# Patient Record
Sex: Female | Born: 1985
Health system: Southern US, Community
[De-identification: ages and names within clinical notes are randomized; demographics above are authoritative.]

## PROBLEM LIST (undated history)

## (undated) DIAGNOSIS — K5090 Crohn's disease, unspecified, without complications: Secondary | ICD-10-CM

## (undated) DIAGNOSIS — F1290 Cannabis use, unspecified, uncomplicated: Secondary | ICD-10-CM

## (undated) DIAGNOSIS — D219 Benign neoplasm of connective and other soft tissue, unspecified: Secondary | ICD-10-CM

## (undated) DIAGNOSIS — I10 Essential (primary) hypertension: Secondary | ICD-10-CM

## (undated) DIAGNOSIS — R1115 Cyclical vomiting syndrome unrelated to migraine: Secondary | ICD-10-CM

## (undated) DIAGNOSIS — E282 Polycystic ovarian syndrome: Secondary | ICD-10-CM

## (undated) DIAGNOSIS — R112 Nausea with vomiting, unspecified: Secondary | ICD-10-CM

## (undated) DIAGNOSIS — N809 Endometriosis, unspecified: Secondary | ICD-10-CM

## (undated) DIAGNOSIS — R1116 Cannabis hyperemesis syndrome: Secondary | ICD-10-CM

## (undated) HISTORY — PX: ABDOMINAL HYSTERECTOMY: SHX81

## (undated) HISTORY — PX: HERNIA REPAIR: SHX51

## (undated) HISTORY — PX: TUBAL LIGATION: SHX77

## (undated) HISTORY — PX: TONSILLECTOMY: SUR1361

## (undated) HISTORY — PX: CHOLECYSTECTOMY: SHX55

---

## 2008-10-07 ENCOUNTER — Emergency Department (HOSPITAL_BASED_OUTPATIENT_CLINIC_OR_DEPARTMENT_OTHER): Admission: EM | Admit: 2008-10-07 | Discharge: 2008-10-07 | Payer: Self-pay | Admitting: Emergency Medicine

## 2008-10-07 ENCOUNTER — Ambulatory Visit: Payer: Self-pay | Admitting: Radiology

## 2009-01-31 ENCOUNTER — Emergency Department (HOSPITAL_BASED_OUTPATIENT_CLINIC_OR_DEPARTMENT_OTHER): Admission: EM | Admit: 2009-01-31 | Discharge: 2009-01-31 | Payer: Self-pay | Admitting: Emergency Medicine

## 2009-01-31 ENCOUNTER — Emergency Department (HOSPITAL_BASED_OUTPATIENT_CLINIC_OR_DEPARTMENT_OTHER): Admission: EM | Admit: 2009-01-31 | Discharge: 2009-02-01 | Payer: Self-pay | Admitting: Emergency Medicine

## 2009-03-18 ENCOUNTER — Ambulatory Visit: Payer: Self-pay | Admitting: Diagnostic Radiology

## 2009-03-18 ENCOUNTER — Emergency Department (HOSPITAL_BASED_OUTPATIENT_CLINIC_OR_DEPARTMENT_OTHER): Admission: EM | Admit: 2009-03-18 | Discharge: 2009-03-18 | Payer: Self-pay | Admitting: Emergency Medicine

## 2009-05-11 ENCOUNTER — Emergency Department (HOSPITAL_BASED_OUTPATIENT_CLINIC_OR_DEPARTMENT_OTHER): Admission: EM | Admit: 2009-05-11 | Discharge: 2009-05-11 | Payer: Self-pay | Admitting: Emergency Medicine

## 2009-06-18 ENCOUNTER — Emergency Department (HOSPITAL_BASED_OUTPATIENT_CLINIC_OR_DEPARTMENT_OTHER): Admission: EM | Admit: 2009-06-18 | Discharge: 2009-06-18 | Payer: Self-pay | Admitting: Emergency Medicine

## 2009-09-17 ENCOUNTER — Emergency Department (HOSPITAL_BASED_OUTPATIENT_CLINIC_OR_DEPARTMENT_OTHER): Admission: EM | Admit: 2009-09-17 | Discharge: 2009-09-17 | Payer: Self-pay | Admitting: Emergency Medicine

## 2009-10-19 ENCOUNTER — Emergency Department (HOSPITAL_BASED_OUTPATIENT_CLINIC_OR_DEPARTMENT_OTHER): Admission: EM | Admit: 2009-10-19 | Discharge: 2009-10-19 | Payer: Self-pay | Admitting: Emergency Medicine

## 2009-10-26 ENCOUNTER — Emergency Department (HOSPITAL_BASED_OUTPATIENT_CLINIC_OR_DEPARTMENT_OTHER): Admission: EM | Admit: 2009-10-26 | Discharge: 2009-10-27 | Payer: Self-pay | Admitting: Emergency Medicine

## 2010-08-11 ENCOUNTER — Other Ambulatory Visit: Payer: Self-pay | Admitting: Emergency Medicine

## 2010-08-11 ENCOUNTER — Emergency Department (HOSPITAL_BASED_OUTPATIENT_CLINIC_OR_DEPARTMENT_OTHER)
Admission: EM | Admit: 2010-08-11 | Discharge: 2010-08-11 | Disposition: A | Discharge status: Home / Self Care | Payer: PRIVATE HEALTH INSURANCE | Attending: Pediatrics | Admitting: Pediatrics

## 2010-08-11 ENCOUNTER — Encounter (HOSPITAL_BASED_OUTPATIENT_CLINIC_OR_DEPARTMENT_OTHER): Payer: Self-pay | Admitting: Radiology

## 2010-08-11 ENCOUNTER — Emergency Department (INDEPENDENT_AMBULATORY_CARE_PROVIDER_SITE_OTHER): Payer: PRIVATE HEALTH INSURANCE

## 2010-08-11 DIAGNOSIS — R112 Nausea with vomiting, unspecified: Secondary | ICD-10-CM

## 2010-08-11 DIAGNOSIS — R197 Diarrhea, unspecified: Secondary | ICD-10-CM | POA: Insufficient documentation

## 2010-08-11 DIAGNOSIS — I10 Essential (primary) hypertension: Secondary | ICD-10-CM | POA: Insufficient documentation

## 2010-08-11 LAB — URINE MICROSCOPIC-ADD ON

## 2010-08-11 LAB — URINALYSIS, ROUTINE W REFLEX MICROSCOPIC
Ketones, ur: 15 mg/dL — AB
Leukocytes, UA: NEGATIVE
Protein, ur: 30 mg/dL — AB
Urine Glucose, Fasting: NEGATIVE mg/dL
Urobilinogen, UA: 1 mg/dL (ref 0.0–1.0)

## 2010-08-11 LAB — PREGNANCY, URINE: Preg Test, Ur: NEGATIVE

## 2010-09-27 LAB — DIFFERENTIAL
Eosinophils Absolute: 0 10*3/uL (ref 0.0–0.7)
Eosinophils Relative: 0 % (ref 0–5)
Lymphocytes Relative: 14 % (ref 12–46)
Lymphs Abs: 2 10*3/uL (ref 0.7–4.0)
Monocytes Absolute: 0.9 10*3/uL (ref 0.1–1.0)

## 2010-09-27 LAB — CBC
HCT: 34.5 % — ABNORMAL LOW (ref 36.0–46.0)
Hemoglobin: 12.2 g/dL (ref 12.0–15.0)
MCV: 92.5 fL (ref 78.0–100.0)
RDW: 12.4 % (ref 11.5–15.5)
WBC: 14.5 10*3/uL — ABNORMAL HIGH (ref 4.0–10.5)

## 2010-09-27 LAB — BASIC METABOLIC PANEL
BUN: 8 mg/dL (ref 6–23)
Chloride: 107 mEq/L (ref 96–112)
GFR calc non Af Amer: >60 mL/min (ref >60)
Glucose, Bld: 101 mg/dL — ABNORMAL HIGH (ref 70–99)
Potassium: 3.6 mEq/L (ref 3.5–5.1)
Sodium: 140 mEq/L (ref 135–145)

## 2010-09-27 LAB — URINALYSIS, ROUTINE W REFLEX MICROSCOPIC
Bilirubin Urine: NEGATIVE
Glucose, UA: NEGATIVE mg/dL
Ketones, ur: >80 mg/dL — AB
Protein, ur: NEGATIVE mg/dL
pH: 6.5 (ref 5.0–8.0)

## 2010-09-27 LAB — URINE MICROSCOPIC-ADD ON

## 2010-09-28 LAB — DIFFERENTIAL
Basophils Absolute: 0.1 10*3/uL (ref 0.0–0.1)
Eosinophils Relative: 0 % (ref 0–5)
Lymphocytes Relative: 5 % — ABNORMAL LOW (ref 12–46)
Lymphs Abs: 0.7 10*3/uL (ref 0.7–4.0)
Monocytes Absolute: 0.4 10*3/uL (ref 0.1–1.0)

## 2010-09-28 LAB — URINE CULTURE

## 2010-09-28 LAB — BASIC METABOLIC PANEL
Chloride: 111 mEq/L (ref 96–112)
GFR calc non Af Amer: >60 mL/min (ref >60)
Glucose, Bld: 91 mg/dL (ref 70–99)
Potassium: 3.7 mEq/L (ref 3.5–5.1)
Sodium: 139 mEq/L (ref 135–145)

## 2010-09-28 LAB — URINALYSIS, ROUTINE W REFLEX MICROSCOPIC
Hgb urine dipstick: NEGATIVE
Nitrite: NEGATIVE
Protein, ur: NEGATIVE mg/dL
Urobilinogen, UA: 0.2 mg/dL (ref 0.0–1.0)

## 2010-09-28 LAB — CBC
HCT: 33.8 % — ABNORMAL LOW (ref 36.0–46.0)
Hemoglobin: 11.9 g/dL — ABNORMAL LOW (ref 12.0–15.0)
RDW: 12.1 % (ref 11.5–15.5)

## 2010-10-03 LAB — URINALYSIS, ROUTINE W REFLEX MICROSCOPIC
Glucose, UA: NEGATIVE mg/dL
Ketones, ur: >80 mg/dL — AB
Leukocytes, UA: NEGATIVE
Nitrite: NEGATIVE
Protein, ur: 100 mg/dL — AB

## 2010-10-03 LAB — CBC
MCHC: 34.7 g/dL (ref 30.0–36.0)
MCV: 92.1 fL (ref 78.0–100.0)
Platelets: 214 10*3/uL (ref 150–400)
RDW: 12.1 % (ref 11.5–15.5)

## 2010-10-03 LAB — URINE MICROSCOPIC-ADD ON

## 2010-10-03 LAB — PREGNANCY, URINE: Preg Test, Ur: POSITIVE

## 2010-10-03 LAB — BASIC METABOLIC PANEL
BUN: 8 mg/dL (ref 6–23)
CO2: 20 mEq/L (ref 19–32)
Chloride: 110 mEq/L (ref 96–112)
Creatinine, Ser: 0.6 mg/dL (ref 0.4–1.2)

## 2010-10-03 LAB — DIFFERENTIAL
Eosinophils Absolute: 0.2 10*3/uL (ref 0.0–0.7)
Monocytes Absolute: 0.9 10*3/uL (ref 0.1–1.0)
Neutrophils Relative %: 89 % — ABNORMAL HIGH (ref 43–77)

## 2010-10-06 LAB — CBC
MCH: 28.3 pg (ref 26.0–34.0)
MCV: 78.8 fL (ref 78.0–100.0)
Platelets: 322 10*3/uL (ref 150–400)
RBC: 4.8 MIL/uL (ref 3.87–5.11)

## 2010-10-06 LAB — COMPREHENSIVE METABOLIC PANEL
Albumin: 5.1 g/dL (ref 3.5–5.2)
BUN: 18 mg/dL (ref 6–23)
Chloride: 108 mEq/L (ref 96–112)
Creatinine, Ser: 0.9 mg/dL (ref 0.4–1.2)
GFR calc non Af Amer: >60 mL/min (ref >60)
Total Bilirubin: 1.3 mg/dL — ABNORMAL HIGH (ref 0.3–1.2)

## 2010-10-06 LAB — LIPASE, BLOOD: Lipase: 31 U/L (ref 23–300)

## 2010-10-06 LAB — DIFFERENTIAL
Basophils Absolute: 0 10*3/uL (ref 0.0–0.1)
Lymphocytes Relative: 5 % — ABNORMAL LOW (ref 12–46)
Neutro Abs: 15.8 10*3/uL — ABNORMAL HIGH (ref 1.7–7.7)

## 2010-10-11 LAB — URINALYSIS, ROUTINE W REFLEX MICROSCOPIC
Leukocytes, UA: NEGATIVE
Protein, ur: 100 mg/dL — AB
Urobilinogen, UA: 0.2 mg/dL (ref 0.0–1.0)

## 2010-10-11 LAB — COMPREHENSIVE METABOLIC PANEL
Albumin: 4.8 g/dL (ref 3.5–5.2)
Alkaline Phosphatase: 96 U/L (ref 39–117)
BUN: 7 mg/dL (ref 6–23)
Calcium: 9.7 mg/dL (ref 8.4–10.5)
Potassium: 3.7 mEq/L (ref 3.5–5.1)
Sodium: 142 mEq/L (ref 135–145)
Total Protein: 8 g/dL (ref 6.0–8.3)

## 2010-10-11 LAB — DIFFERENTIAL
Basophils Relative: 5 % — ABNORMAL HIGH (ref 0–1)
Lymphs Abs: 0.6 10*3/uL — ABNORMAL LOW (ref 0.7–4.0)
Monocytes Absolute: 0.5 10*3/uL (ref 0.1–1.0)
Monocytes Relative: 3 % (ref 3–12)
Neutro Abs: 14 10*3/uL — ABNORMAL HIGH (ref 1.7–7.7)

## 2010-10-11 LAB — CBC
HCT: 39.6 % (ref 36.0–46.0)
MCHC: 34.5 g/dL (ref 30.0–36.0)
Platelets: 261 10*3/uL (ref 150–400)
RDW: 12.4 % (ref 11.5–15.5)

## 2010-10-11 LAB — URINE MICROSCOPIC-ADD ON

## 2010-10-11 LAB — GC/CHLAMYDIA PROBE AMP, GENITAL: Chlamydia, DNA Probe: NEGATIVE

## 2010-10-11 LAB — WET PREP, GENITAL

## 2010-10-11 LAB — PREGNANCY, URINE: Preg Test, Ur: NEGATIVE

## 2010-10-11 LAB — RPR: RPR Ser Ql: NONREACTIVE

## 2010-10-12 LAB — WET PREP, GENITAL
Trich, Wet Prep: NONE SEEN
WBC, Wet Prep HPF POC: NONE SEEN
Yeast Wet Prep HPF POC: NONE SEEN

## 2010-10-12 LAB — GC/CHLAMYDIA PROBE AMP, GENITAL: GC Probe Amp, Genital: NEGATIVE

## 2010-10-12 LAB — PREGNANCY, URINE: Preg Test, Ur: NEGATIVE

## 2010-10-14 LAB — URINALYSIS, ROUTINE W REFLEX MICROSCOPIC
Bilirubin Urine: NEGATIVE
Glucose, UA: NEGATIVE mg/dL
Hgb urine dipstick: NEGATIVE
Ketones, ur: NEGATIVE mg/dL
Nitrite: NEGATIVE
Protein, ur: NEGATIVE mg/dL
Specific Gravity, Urine: 1.012 (ref 1.005–1.030)
Urobilinogen, UA: 1 mg/dL (ref 0.0–1.0)
pH: 6.5 (ref 5.0–8.0)

## 2010-10-14 LAB — CBC
MCHC: 35 g/dL (ref 30.0–36.0)
Platelets: 238 10*3/uL (ref 150–400)
RDW: 12.3 % (ref 11.5–15.5)

## 2010-10-14 LAB — PREGNANCY, URINE: Preg Test, Ur: NEGATIVE

## 2010-10-14 LAB — BASIC METABOLIC PANEL
BUN: 12 mg/dL (ref 6–23)
CO2: 26 mEq/L (ref 19–32)
Calcium: 9.4 mg/dL (ref 8.4–10.5)
Creatinine, Ser: 0.8 mg/dL (ref 0.4–1.2)
GFR calc non Af Amer: >60 mL/min (ref >60)
Glucose, Bld: 84 mg/dL (ref 70–99)

## 2010-10-14 LAB — DIFFERENTIAL
Basophils Absolute: 0.1 10*3/uL (ref 0.0–0.1)
Eosinophils Absolute: 0.2 10*3/uL (ref 0.0–0.7)
Lymphocytes Relative: 26 % (ref 12–46)
Monocytes Relative: 6 % (ref 3–12)
Neutro Abs: 5.9 10*3/uL (ref 1.7–7.7)

## 2010-10-14 LAB — GC/CHLAMYDIA PROBE AMP, GENITAL
Chlamydia, DNA Probe: NEGATIVE
GC Probe Amp, Genital: NEGATIVE

## 2010-10-14 LAB — WET PREP, GENITAL: Trich, Wet Prep: NONE SEEN

## 2010-10-16 LAB — COMPREHENSIVE METABOLIC PANEL
Alkaline Phosphatase: 99 U/L (ref 39–117)
BUN: 9 mg/dL (ref 6–23)
CO2: 18 mEq/L — ABNORMAL LOW (ref 19–32)
Chloride: 105 mEq/L (ref 96–112)
Glucose, Bld: 100 mg/dL — ABNORMAL HIGH (ref 70–99)
Potassium: 4.9 mEq/L (ref 3.5–5.1)
Total Bilirubin: 1 mg/dL (ref 0.3–1.2)

## 2010-10-16 LAB — CBC
Hemoglobin: 14.9 g/dL (ref 12.0–15.0)
MCHC: 33.3 g/dL (ref 30.0–36.0)
RBC: 4.8 MIL/uL (ref 3.87–5.11)
WBC: 10.7 10*3/uL — ABNORMAL HIGH (ref 4.0–10.5)

## 2010-10-16 LAB — URINE CULTURE: Colony Count: >=100000

## 2010-10-16 LAB — URINALYSIS, ROUTINE W REFLEX MICROSCOPIC
Protein, ur: 100 mg/dL — AB
Urobilinogen, UA: 0.2 mg/dL (ref 0.0–1.0)

## 2010-10-16 LAB — DIFFERENTIAL
Basophils Absolute: 0.3 10*3/uL — ABNORMAL HIGH (ref 0.0–0.1)
Basophils Relative: 2 % — ABNORMAL HIGH (ref 0–1)
Monocytes Absolute: 0.4 10*3/uL (ref 0.1–1.0)
Neutro Abs: 9.4 10*3/uL — ABNORMAL HIGH (ref 1.7–7.7)
Neutrophils Relative %: 88 % — ABNORMAL HIGH (ref 43–77)

## 2010-10-16 LAB — URINE MICROSCOPIC-ADD ON

## 2010-10-17 ENCOUNTER — Emergency Department (HOSPITAL_BASED_OUTPATIENT_CLINIC_OR_DEPARTMENT_OTHER)
Admission: EM | Admit: 2010-10-17 | Discharge: 2010-10-17 | Disposition: A | Discharge status: Home / Self Care | Payer: PRIVATE HEALTH INSURANCE | Attending: Emergency Medicine | Admitting: Emergency Medicine

## 2010-10-17 DIAGNOSIS — I10 Essential (primary) hypertension: Secondary | ICD-10-CM | POA: Insufficient documentation

## 2010-10-17 DIAGNOSIS — R197 Diarrhea, unspecified: Secondary | ICD-10-CM | POA: Insufficient documentation

## 2010-10-17 DIAGNOSIS — R112 Nausea with vomiting, unspecified: Secondary | ICD-10-CM | POA: Insufficient documentation

## 2010-10-17 LAB — DIFFERENTIAL
Basophils Absolute: 0 10*3/uL (ref 0.0–0.1)
Basophils Relative: 0 % (ref 0–1)
Eosinophils Absolute: 0 10*3/uL (ref 0.0–0.7)
Eosinophils Relative: 0 % (ref 0–5)
Monocytes Absolute: 0.9 10*3/uL (ref 0.1–1.0)
Neutro Abs: 12.9 10*3/uL — ABNORMAL HIGH (ref 1.7–7.7)

## 2010-10-17 LAB — COMPREHENSIVE METABOLIC PANEL
ALT: 13 U/L (ref 0–35)
AST: 31 U/L (ref 0–37)
Calcium: 10.2 mg/dL (ref 8.4–10.5)
Creatinine, Ser: 0.7 mg/dL (ref 0.4–1.2)
GFR calc Af Amer: >60 mL/min (ref >60)
Sodium: 144 mEq/L (ref 135–145)
Total Protein: 10.1 g/dL — ABNORMAL HIGH (ref 6.0–8.3)

## 2010-10-17 LAB — URINE MICROSCOPIC-ADD ON

## 2010-10-17 LAB — URINALYSIS, ROUTINE W REFLEX MICROSCOPIC
Bilirubin Urine: NEGATIVE
Nitrite: NEGATIVE
Specific Gravity, Urine: 1.015 (ref 1.005–1.030)
pH: 6 (ref 5.0–8.0)

## 2010-10-17 LAB — CBC
Hemoglobin: 14.6 g/dL (ref 12.0–15.0)
MCHC: 35.2 g/dL (ref 30.0–36.0)
Platelets: 322 10*3/uL (ref 150–400)
RDW: 12.8 % (ref 11.5–15.5)

## 2010-10-20 LAB — COMPREHENSIVE METABOLIC PANEL
ALT: 19 U/L (ref 0–35)
AST: 25 U/L (ref 0–37)
Albumin: 4.6 g/dL (ref 3.5–5.2)
CO2: 23 mEq/L (ref 19–32)
Calcium: 9.5 mg/dL (ref 8.4–10.5)
Creatinine, Ser: 0.9 mg/dL (ref 0.4–1.2)
GFR calc Af Amer: >60 mL/min (ref >60)
GFR calc non Af Amer: >60 mL/min (ref >60)
Sodium: 142 mEq/L (ref 135–145)

## 2010-10-20 LAB — PREGNANCY, URINE: Preg Test, Ur: NEGATIVE

## 2010-10-20 LAB — URINALYSIS, ROUTINE W REFLEX MICROSCOPIC
Bilirubin Urine: NEGATIVE
Glucose, UA: NEGATIVE mg/dL
Hgb urine dipstick: NEGATIVE
Ketones, ur: 40 mg/dL — AB
Nitrite: NEGATIVE
Specific Gravity, Urine: 1.013 (ref 1.005–1.030)
pH: 6.5 (ref 5.0–8.0)

## 2010-10-20 LAB — DIFFERENTIAL
Eosinophils Absolute: 0 10*3/uL (ref 0.0–0.7)
Eosinophils Relative: 0 % (ref 0–5)
Lymphocytes Relative: 7 % — ABNORMAL LOW (ref 12–46)
Lymphs Abs: 0.7 10*3/uL (ref 0.7–4.0)
Monocytes Absolute: 0.6 10*3/uL (ref 0.1–1.0)
Monocytes Relative: 5 % (ref 3–12)

## 2010-10-20 LAB — CBC
MCHC: 33.3 g/dL (ref 30.0–36.0)
Platelets: 268 10*3/uL (ref 150–400)
RBC: 4.84 MIL/uL (ref 3.87–5.11)
WBC: 10.5 10*3/uL (ref 4.0–10.5)

## 2010-11-02 ENCOUNTER — Emergency Department (HOSPITAL_BASED_OUTPATIENT_CLINIC_OR_DEPARTMENT_OTHER)
Admission: EM | Admit: 2010-11-02 | Discharge: 2010-11-02 | Disposition: A | Discharge status: Home / Self Care | Payer: PRIVATE HEALTH INSURANCE | Attending: Emergency Medicine | Admitting: Emergency Medicine

## 2010-11-02 DIAGNOSIS — R112 Nausea with vomiting, unspecified: Secondary | ICD-10-CM | POA: Insufficient documentation

## 2010-11-02 DIAGNOSIS — I10 Essential (primary) hypertension: Secondary | ICD-10-CM | POA: Insufficient documentation

## 2010-11-02 DIAGNOSIS — R197 Diarrhea, unspecified: Secondary | ICD-10-CM | POA: Insufficient documentation

## 2010-11-02 LAB — BASIC METABOLIC PANEL
BUN: 6 mg/dL (ref 6–23)
Chloride: 107 mEq/L (ref 96–112)
GFR calc non Af Amer: >60 mL/min (ref >60)
Glucose, Bld: 86 mg/dL (ref 70–99)
Potassium: 3.9 mEq/L (ref 3.5–5.1)
Sodium: 144 mEq/L (ref 135–145)

## 2010-11-02 LAB — URINALYSIS, ROUTINE W REFLEX MICROSCOPIC
Bilirubin Urine: NEGATIVE
Nitrite: NEGATIVE
Specific Gravity, Urine: 1.026 (ref 1.005–1.030)
Urobilinogen, UA: 0.2 mg/dL (ref 0.0–1.0)
pH: 6 (ref 5.0–8.0)

## 2010-11-02 LAB — URINE MICROSCOPIC-ADD ON

## 2010-11-30 ENCOUNTER — Emergency Department (HOSPITAL_BASED_OUTPATIENT_CLINIC_OR_DEPARTMENT_OTHER)
Admission: EM | Admit: 2010-11-30 | Discharge: 2010-11-30 | Disposition: A | Discharge status: Home / Self Care | Payer: PRIVATE HEALTH INSURANCE | Attending: Emergency Medicine | Admitting: Emergency Medicine

## 2010-11-30 DIAGNOSIS — R112 Nausea with vomiting, unspecified: Secondary | ICD-10-CM | POA: Insufficient documentation

## 2010-11-30 DIAGNOSIS — I10 Essential (primary) hypertension: Secondary | ICD-10-CM | POA: Insufficient documentation

## 2010-11-30 DIAGNOSIS — R109 Unspecified abdominal pain: Secondary | ICD-10-CM | POA: Insufficient documentation

## 2010-11-30 LAB — BASIC METABOLIC PANEL
BUN: 11 mg/dL (ref 6–23)
CO2: 19 mEq/L (ref 19–32)
Calcium: 9.7 mg/dL (ref 8.4–10.5)
Chloride: 104 mEq/L (ref 96–112)
Creatinine, Ser: 0.6 mg/dL (ref 0.4–1.2)

## 2010-11-30 LAB — URINALYSIS, ROUTINE W REFLEX MICROSCOPIC
Bilirubin Urine: NEGATIVE
Glucose, UA: NEGATIVE mg/dL
Ketones, ur: 40 mg/dL — AB
Leukocytes, UA: NEGATIVE
Nitrite: NEGATIVE
Protein, ur: NEGATIVE mg/dL
Specific Gravity, Urine: 1.015 (ref 1.005–1.030)
Urobilinogen, UA: 1 mg/dL (ref 0.0–1.0)
pH: 7 (ref 5.0–8.0)

## 2010-11-30 LAB — URINE MICROSCOPIC-ADD ON

## 2010-11-30 LAB — BASIC METABOLIC PANEL WITH GFR
GFR calc Af Amer: >60 mL/min (ref >60)
GFR calc non Af Amer: >60 mL/min (ref >60)
Glucose, Bld: 116 mg/dL — ABNORMAL HIGH (ref 70–99)
Potassium: 3.5 meq/L (ref 3.5–5.1)
Sodium: 138 meq/L (ref 135–145)

## 2010-11-30 LAB — PREGNANCY, URINE: Preg Test, Ur: NEGATIVE

## 2010-12-22 ENCOUNTER — Emergency Department (HOSPITAL_BASED_OUTPATIENT_CLINIC_OR_DEPARTMENT_OTHER)
Admission: EM | Admit: 2010-12-22 | Discharge: 2010-12-22 | Disposition: A | Discharge status: Home / Self Care | Payer: PRIVATE HEALTH INSURANCE | Attending: Emergency Medicine | Admitting: Emergency Medicine

## 2010-12-22 DIAGNOSIS — E876 Hypokalemia: Secondary | ICD-10-CM | POA: Insufficient documentation

## 2010-12-22 DIAGNOSIS — R112 Nausea with vomiting, unspecified: Secondary | ICD-10-CM | POA: Insufficient documentation

## 2010-12-22 LAB — DIFFERENTIAL
Lymphocytes Relative: 10 % — ABNORMAL LOW (ref 12–46)
Lymphs Abs: 1.1 10*3/uL (ref 0.7–4.0)
Neutro Abs: 9.3 10*3/uL — ABNORMAL HIGH (ref 1.7–7.7)
Neutrophils Relative %: 81 % — ABNORMAL HIGH (ref 43–77)

## 2010-12-22 LAB — COMPREHENSIVE METABOLIC PANEL
Albumin: 4.9 g/dL (ref 3.5–5.2)
Alkaline Phosphatase: 101 U/L (ref 39–117)
BUN: 16 mg/dL (ref 6–23)
Chloride: 103 mEq/L (ref 96–112)
Glucose, Bld: 132 mg/dL — ABNORMAL HIGH (ref 70–99)
Potassium: 3 mEq/L — ABNORMAL LOW (ref 3.5–5.1)
Total Bilirubin: 1.1 mg/dL (ref 0.3–1.2)

## 2010-12-22 LAB — CBC
HCT: 42.1 % (ref 36.0–46.0)
Hemoglobin: 14.6 g/dL (ref 12.0–15.0)
RBC: 4.98 MIL/uL (ref 3.87–5.11)
RDW: 13.5 % (ref 11.5–15.5)
WBC: 11.5 10*3/uL — ABNORMAL HIGH (ref 4.0–10.5)

## 2010-12-22 LAB — LIPASE, BLOOD: Lipase: 43 U/L (ref 11–59)

## 2010-12-22 LAB — URINALYSIS, ROUTINE W REFLEX MICROSCOPIC
Bilirubin Urine: NEGATIVE
Hgb urine dipstick: NEGATIVE
Specific Gravity, Urine: 1.015 (ref 1.005–1.030)
Urobilinogen, UA: 1 mg/dL (ref 0.0–1.0)
pH: 6 (ref 5.0–8.0)

## 2010-12-22 LAB — URINE MICROSCOPIC-ADD ON

## 2011-01-16 ENCOUNTER — Emergency Department (HOSPITAL_BASED_OUTPATIENT_CLINIC_OR_DEPARTMENT_OTHER)
Admission: EM | Admit: 2011-01-16 | Discharge: 2011-01-17 | Discharge status: Against Medical Advice | Payer: PRIVATE HEALTH INSURANCE | Attending: Emergency Medicine | Admitting: Emergency Medicine

## 2011-01-16 ENCOUNTER — Encounter (HOSPITAL_BASED_OUTPATIENT_CLINIC_OR_DEPARTMENT_OTHER): Payer: Self-pay | Admitting: *Deleted

## 2011-01-16 ENCOUNTER — Emergency Department (HOSPITAL_BASED_OUTPATIENT_CLINIC_OR_DEPARTMENT_OTHER)
Admission: EM | Admit: 2011-01-16 | Discharge: 2011-01-16 | Disposition: A | Discharge status: Home / Self Care | Payer: PRIVATE HEALTH INSURANCE | Attending: Emergency Medicine | Admitting: Emergency Medicine

## 2011-01-16 DIAGNOSIS — I10 Essential (primary) hypertension: Secondary | ICD-10-CM | POA: Insufficient documentation

## 2011-01-16 DIAGNOSIS — R1110 Vomiting, unspecified: Secondary | ICD-10-CM | POA: Insufficient documentation

## 2011-01-16 DIAGNOSIS — R1115 Cyclical vomiting syndrome unrelated to migraine: Secondary | ICD-10-CM

## 2011-01-16 DIAGNOSIS — R197 Diarrhea, unspecified: Secondary | ICD-10-CM | POA: Insufficient documentation

## 2011-01-16 DIAGNOSIS — R1011 Right upper quadrant pain: Secondary | ICD-10-CM | POA: Insufficient documentation

## 2011-01-16 HISTORY — DX: Essential (primary) hypertension: I10

## 2011-01-16 HISTORY — DX: Cyclical vomiting syndrome unrelated to migraine: R11.15

## 2011-01-16 LAB — PREGNANCY, URINE: Preg Test, Ur: NEGATIVE

## 2011-01-16 LAB — URINALYSIS, ROUTINE W REFLEX MICROSCOPIC
Glucose, UA: NEGATIVE mg/dL
Leukocytes, UA: NEGATIVE
Nitrite: NEGATIVE
Protein, ur: NEGATIVE mg/dL
pH: 7 (ref 5.0–8.0)

## 2011-01-16 LAB — CBC
Platelets: 244 10*3/uL (ref 150–400)
RDW: 12.7 % (ref 11.5–15.5)
WBC: 8.3 10*3/uL (ref 4.0–10.5)

## 2011-01-16 LAB — BASIC METABOLIC PANEL
CO2: 28 mEq/L (ref 19–32)
Chloride: 105 mEq/L (ref 96–112)
GFR calc Af Amer: >60 mL/min (ref >60)
Sodium: 141 mEq/L (ref 135–145)

## 2011-01-16 LAB — DIFFERENTIAL
Basophils Absolute: 0 10*3/uL (ref 0.0–0.1)
Lymphocytes Relative: 23 % (ref 12–46)
Neutro Abs: 5.5 10*3/uL (ref 1.7–7.7)
Neutrophils Relative %: 67 % (ref 43–77)

## 2011-01-16 MED ORDER — HYDROMORPHONE HCL 1 MG/ML IJ SOLN
1.0000 mg | INTRAMUSCULAR | Status: DC | PRN
  Administered 2011-01-16 (×2): 1 mg via INTRAVENOUS
  Filled 2011-01-16 (×2): qty 1

## 2011-01-16 MED ORDER — PROMETHAZINE HCL 25 MG/ML IJ SOLN
25.0000 mg | Freq: Once | INTRAMUSCULAR | Status: AC
  Administered 2011-01-16: 25 mg via INTRAVENOUS
  Filled 2011-01-16: qty 1

## 2011-01-16 MED ORDER — HYDROCODONE-ACETAMINOPHEN 5-325 MG PO TABS: 1.0000 | ORAL_TABLET | Freq: Four times a day (QID) | ORAL | Status: AC | PRN

## 2011-01-16 MED ORDER — SODIUM CHLORIDE 0.9 % IV SOLN
999.0000 mL | Freq: Once | INTRAVENOUS | Status: AC
  Administered 2011-01-16: 1000 mL via INTRAVENOUS

## 2011-01-16 NOTE — ED Notes (Addendum)
Patient is resting comfortably. IV access assessed. Bedside commode placed near patient. Introduced self to pt, pain, position, and toileting addressed

## 2011-01-16 NOTE — ED Notes (Signed)
Pt given po ginger ale per MD request. Tolerating well. Call bell at bedside.

## 2011-01-16 NOTE — ED Provider Notes (Addendum)
History     Chief Complaint  Patient presents with  . Abdominal Pain   Patient is a 25 y.o. female presenting with abdominal pain. The history is provided by the patient.  Abdominal Pain The primary symptoms of the illness include abdominal pain, nausea, vomiting and diarrhea. The current episode started more than 2 days ago. The problem has not changed since onset. The abdominal pain is located in the RUQ. Pain scale: moderate. The abdominal pain is relieved by nothing. The abdominal pain is exacerbated by vomiting and movement.  Associated with: cyclic vomiting syndrome and not being able to keep her PO or PR Phenergan in.    Past Medical History  Diagnosis Date  . Hypertension   . Cyclical vomiting     Past Surgical History  Procedure Date  . Tonsillectomy   . Tubal ligation   . Cesarean section     History reviewed. No pertinent family history.  History  Substance Use Topics  . Smoking status: Never Smoker   . Smokeless tobacco: Not on file  . Alcohol Use: No    OB History    Grav Para Term Preterm Abortions TAB SAB Ect Mult Living                  Review of Systems  Gastrointestinal: Positive for nausea, vomiting, abdominal pain and diarrhea.  All other systems reviewed and are negative.    Physical Exam  BP 136/84  Pulse 79  Temp(Src) 97.8 F (36.6 C) (Oral)  Resp 16  Ht 5' 3"  (1.6 m)  Wt 139 lb (63.05 kg)  BMI 24.62 kg/m2  SpO2 100%  LMP 12/28/2010  Physical Exam  Constitutional: She appears well-developed and well-nourished.  HENT:  Head: Normocephalic and atraumatic.  Eyes: EOM are normal. Pupils are equal, round, and reactive to light.  Neck: Normal range of motion. Neck supple.  Cardiovascular: Normal rate and regular rhythm.   Pulmonary/Chest: Effort normal and breath sounds normal.  Abdominal: Soft. She exhibits no distension. There is tenderness in the right upper quadrant. There is no rigidity and no guarding.    ED Course    Procedures  MDM Results for orders placed during the hospital encounter of 01/16/11  CBC      Component Value Range   WBC 8.3  4.0 - 10.5 (K/uL)   RBC 4.01  3.87 - 5.11 (MIL/uL)   Hemoglobin 11.8 (*) 12.0 - 15.0 (g/dL)   HCT 35.1 (*) 36.0 - 46.0 (%)   MCV 87.5  78.0 - 100.0 (fL)   MCH 29.4  26.0 - 34.0 (pg)   MCHC 33.6  30.0 - 36.0 (g/dL)   RDW 12.7  11.5 - 15.5 (%)   Platelets 244  150 - 400 (K/uL)  DIFFERENTIAL      Component Value Range   Neutrophils Relative 67  43 - 77 (%)   Neutro Abs 5.5  1.7 - 7.7 (K/uL)   Lymphocytes Relative 23  12 - 46 (%)   Lymphs Abs 1.9  0.7 - 4.0 (K/uL)   Monocytes Relative 8  3 - 12 (%)   Monocytes Absolute 0.7  0.1 - 1.0 (K/uL)   Eosinophils Relative 2  0 - 5 (%)   Eosinophils Absolute 0.2  0.0 - 0.7 (K/uL)   Basophils Relative 0  0 - 1 (%)   Basophils Absolute 0.0  0.0 - 0.1 (K/uL)  BASIC METABOLIC PANEL      Component Value Range   Sodium 141  135 - 145 (mEq/L)   Potassium 3.8  3.5 - 5.1 (mEq/L)   Chloride 105  96 - 112 (mEq/L)   CO2 28  19 - 32 (mEq/L)   Glucose, Bld 93  70 - 99 (mg/dL)   BUN 10  6 - 23 (mg/dL)   Creatinine, Ser 0.60  0.50 - 1.10 (mg/dL)   Calcium 9.7  8.4 - 10.5 (mg/dL)   GFR calc non Af Amer >60  >60 (mL/min)   GFR calc Af Amer >60  >60 (mL/min)  URINALYSIS, ROUTINE W REFLEX MICROSCOPIC      Component Value Range   Color, Urine YELLOW  YELLOW    Appearance CLEAR  CLEAR    Specific Gravity, Urine 1.009  1.005 - 1.030    pH 7.0  5.0 - 8.0    Glucose, UA NEGATIVE  NEGATIVE (mg/dL)   Hgb urine dipstick NEGATIVE  NEGATIVE    Bilirubin Urine NEGATIVE  NEGATIVE    Ketones NEGATIVE  NEGATIVE (mg/dL)   Protein NEGATIVE  NEGATIVE (mg/dL)   Urobilinogen, UA 0.2  0.0 - 1.0 (mg/dL)   Nitrite NEGATIVE  NEGATIVE    Leukocytes, UA NEGATIVE  NEGATIVE   PREGNANCY, URINE      Component Value Range   Preg Test, Ur NEGATIVE         7:52 AM -- Drinking fluids without emesis. Requests hydrocodone for pain as she is  out.   Wynetta Fines, MD 01/16/11 [place], MD 01/16/11 [phone]

## 2011-01-16 NOTE — ED Notes (Signed)
Pt c/o abd pain with N/V/D that began 2 days ago. Pt has hx of cyclic vomiting and sts her phenergan suppositories are not working.

## 2011-01-16 NOTE — ED Notes (Signed)
Pt reports chronic N/V/D x 3 days states that she was seen here yesterday for same sx and her PCP who told her to come here

## 2011-01-18 ENCOUNTER — Encounter (HOSPITAL_BASED_OUTPATIENT_CLINIC_OR_DEPARTMENT_OTHER): Payer: Self-pay | Admitting: *Deleted

## 2011-01-18 ENCOUNTER — Emergency Department (HOSPITAL_BASED_OUTPATIENT_CLINIC_OR_DEPARTMENT_OTHER)
Admission: EM | Admit: 2011-01-18 | Discharge: 2011-01-18 | Disposition: A | Discharge status: Home / Self Care | Payer: PRIVATE HEALTH INSURANCE | Attending: Emergency Medicine | Admitting: Emergency Medicine

## 2011-01-18 DIAGNOSIS — R112 Nausea with vomiting, unspecified: Secondary | ICD-10-CM | POA: Insufficient documentation

## 2011-01-18 DIAGNOSIS — R1084 Generalized abdominal pain: Secondary | ICD-10-CM | POA: Insufficient documentation

## 2011-01-18 LAB — CBC
Hemoglobin: 12 g/dL (ref 12.0–15.0)
MCHC: 33.7 g/dL (ref 30.0–36.0)
Platelets: 249 10*3/uL (ref 150–400)
RBC: 4.12 MIL/uL (ref 3.87–5.11)

## 2011-01-18 LAB — DIFFERENTIAL
Basophils Relative: 0 % (ref 0–1)
Eosinophils Absolute: 0.1 10*3/uL (ref 0.0–0.7)
Monocytes Relative: 6 % (ref 3–12)
Neutro Abs: 8.6 10*3/uL — ABNORMAL HIGH (ref 1.7–7.7)
Neutrophils Relative %: 81 % — ABNORMAL HIGH (ref 43–77)

## 2011-01-18 LAB — URINALYSIS, ROUTINE W REFLEX MICROSCOPIC
Bilirubin Urine: NEGATIVE
Glucose, UA: NEGATIVE mg/dL
Hgb urine dipstick: NEGATIVE
Ketones, ur: 15 mg/dL — AB
Specific Gravity, Urine: 1.02 (ref 1.005–1.030)
pH: 7 (ref 5.0–8.0)

## 2011-01-18 LAB — COMPREHENSIVE METABOLIC PANEL
ALT: 9 U/L (ref 0–35)
AST: 19 U/L (ref 0–37)
Albumin: 4.1 g/dL (ref 3.5–5.2)
Alkaline Phosphatase: 88 U/L (ref 39–117)
BUN: 7 mg/dL (ref 6–23)
Chloride: 102 mEq/L (ref 96–112)
Potassium: 3.3 mEq/L — ABNORMAL LOW (ref 3.5–5.1)
Sodium: 140 mEq/L (ref 135–145)
Total Bilirubin: 0.4 mg/dL (ref 0.3–1.2)
Total Protein: 7.6 g/dL (ref 6.0–8.3)

## 2011-01-18 MED ORDER — PROMETHAZINE HCL 25 MG RE SUPP: 25.0000 mg | Freq: Four times a day (QID) | RECTAL | Status: DC | PRN

## 2011-01-18 MED ORDER — SODIUM CHLORIDE 0.9 % IV BOLUS (SEPSIS)
1000.0000 mL | Freq: Once | INTRAVENOUS | Status: AC
  Administered 2011-01-18: 1000 mL via INTRAVENOUS

## 2011-01-18 MED ORDER — PROMETHAZINE HCL 25 MG/ML IJ SOLN
25.0000 mg | Freq: Once | INTRAMUSCULAR | Status: AC
  Administered 2011-01-18: 25 mg via INTRAVENOUS
  Filled 2011-01-18: qty 1

## 2011-01-18 MED ORDER — HYDROMORPHONE HCL 1 MG/ML IJ SOLN
1.0000 mg | Freq: Once | INTRAMUSCULAR | Status: AC
  Administered 2011-01-18: 1 mg via INTRAVENOUS
  Filled 2011-01-18: qty 1

## 2011-01-18 NOTE — ED Provider Notes (Signed)
History     Chief Complaint  Patient presents with  . Emesis   Patient is a 25 y.o. female presenting with abdominal pain. The history is provided by the patient.  Abdominal Pain The primary symptoms of the illness include abdominal pain, nausea, vomiting and diarrhea. The primary symptoms of the illness do not include fever, dysuria, vaginal discharge or vaginal bleeding. The current episode started more than 2 days ago. The onset of the illness was gradual. The problem has been gradually worsening.  The patient states that she believes she is currently not pregnant. The patient has not had a change in bowel habit. Additional symptoms associated with the illness include anorexia. Symptoms associated with the illness do not include heartburn, urgency or back pain.  h/o chronic abd pain since age 24, seen by her pcp for same yesterday, has referral to see gi tomorrow--states only phenergan and dialudid make her sx better  Past Medical History  Diagnosis Date  . Hypertension   . Cyclical vomiting     Past Surgical History  Procedure Date  . Tonsillectomy   . Tubal ligation   . Cesarean section     No family history on file.  History  Substance Use Topics  . Smoking status: Never Smoker   . Smokeless tobacco: Not on file  . Alcohol Use: No    OB History    Grav Para Term Preterm Abortions TAB SAB Ect Mult Living                  Review of Systems  Constitutional: Negative for fever.  Gastrointestinal: Positive for nausea, vomiting, abdominal pain, diarrhea and anorexia. Negative for heartburn.  Genitourinary: Negative for dysuria, urgency, vaginal bleeding and vaginal discharge.  Musculoskeletal: Negative for back pain.  All other systems reviewed and are negative.    Physical Exam  BP 153/98  Pulse 73  Temp(Src) 97.9 F (36.6 C) (Oral)  Resp 20  Ht 5' 3"  (1.6 m)  Wt 138 lb (62.596 kg)  BMI 24.45 kg/m2  SpO2 100%  LMP 12/28/2010  Physical Exam  Nursing note  and vitals reviewed. Constitutional: She is oriented to person, place, and time. Vital signs are normal. She appears well-developed and well-nourished.  Non-toxic appearance. No distress.  HENT:  Head: Normocephalic and atraumatic.  Eyes: Conjunctivae and EOM are normal. Pupils are equal, round, and reactive to light.  Neck: Normal range of motion. Neck supple. No tracheal deviation present.  Cardiovascular: Normal rate, regular rhythm and normal heart sounds.  Exam reveals no gallop.   No murmur heard. Pulmonary/Chest: Effort normal and breath sounds normal. No stridor. No respiratory distress. She has no wheezes.  Abdominal: Soft. Normal appearance and bowel sounds are normal. She exhibits no distension and no mass. There is tenderness. There is no rebound, no guarding and no CVA tenderness. No hernia.  Musculoskeletal: Normal range of motion. She exhibits no edema and no tenderness.  Neurological: She is alert and oriented to person, place, and time. She has normal strength. No cranial nerve deficit or sensory deficit. GCS eye subscore is 4. GCS verbal subscore is 5. GCS motor subscore is 6.  Skin: Skin is warm and dry.  Psychiatric: She has a normal mood and affect. Her speech is normal and behavior is normal.    ED Course  Procedures  MDM Pt given iv fluids and meds for n/v/pain  Pt feeling better, will send home      Leota Jacobsen, MD 01/18/11  2220 

## 2011-01-18 NOTE — ED Notes (Signed)
Pt c/o n/v. Hx of same. Seen here Sunday for same

## 2011-02-17 ENCOUNTER — Telehealth (HOSPITAL_BASED_OUTPATIENT_CLINIC_OR_DEPARTMENT_OTHER): Payer: Self-pay | Admitting: Emergency Medicine

## 2011-02-17 ENCOUNTER — Encounter (HOSPITAL_BASED_OUTPATIENT_CLINIC_OR_DEPARTMENT_OTHER): Payer: Self-pay

## 2011-02-17 ENCOUNTER — Emergency Department (HOSPITAL_BASED_OUTPATIENT_CLINIC_OR_DEPARTMENT_OTHER)
Admission: EM | Admit: 2011-02-17 | Discharge: 2011-02-17 | Disposition: A | Discharge status: Home / Self Care | Payer: PRIVATE HEALTH INSURANCE | Attending: Emergency Medicine | Admitting: Emergency Medicine

## 2011-02-17 DIAGNOSIS — R1115 Cyclical vomiting syndrome unrelated to migraine: Secondary | ICD-10-CM | POA: Insufficient documentation

## 2011-02-17 DIAGNOSIS — R197 Diarrhea, unspecified: Secondary | ICD-10-CM | POA: Insufficient documentation

## 2011-02-17 DIAGNOSIS — I10 Essential (primary) hypertension: Secondary | ICD-10-CM | POA: Insufficient documentation

## 2011-02-17 DIAGNOSIS — R109 Unspecified abdominal pain: Secondary | ICD-10-CM | POA: Insufficient documentation

## 2011-02-17 LAB — BASIC METABOLIC PANEL
CO2: 25 mEq/L (ref 19–32)
Chloride: 102 mEq/L (ref 96–112)
GFR calc non Af Amer: >60 mL/min (ref >60)
Glucose, Bld: 92 mg/dL (ref 70–99)
Potassium: 4 mEq/L (ref 3.5–5.1)
Sodium: 138 mEq/L (ref 135–145)

## 2011-02-17 LAB — PREGNANCY, URINE: Preg Test, Ur: NEGATIVE

## 2011-02-17 LAB — CBC
Platelets: 264 10*3/uL (ref 150–400)
RBC: 4.3 MIL/uL (ref 3.87–5.11)
WBC: 7.7 10*3/uL (ref 4.0–10.5)

## 2011-02-17 LAB — URINALYSIS, ROUTINE W REFLEX MICROSCOPIC
Glucose, UA: NEGATIVE mg/dL
Ketones, ur: NEGATIVE mg/dL
Leukocytes, UA: NEGATIVE
pH: 7 (ref 5.0–8.0)

## 2011-02-17 LAB — HEPATIC FUNCTION PANEL
AST: 16 U/L (ref 0–37)
Albumin: 4.1 g/dL (ref 3.5–5.2)
Alkaline Phosphatase: 85 U/L (ref 39–117)
Total Bilirubin: 0.4 mg/dL (ref 0.3–1.2)

## 2011-02-17 LAB — DIFFERENTIAL
Lymphocytes Relative: 17 % (ref 12–46)
Lymphs Abs: 1.3 10*3/uL (ref 0.7–4.0)
Neutro Abs: 5.7 10*3/uL (ref 1.7–7.7)
Neutrophils Relative %: 75 % (ref 43–77)

## 2011-02-17 MED ORDER — PROMETHAZINE HCL 25 MG/ML IJ SOLN
25.0000 mg | Freq: Once | INTRAMUSCULAR | Status: AC
  Administered 2011-02-17: 25 mg via INTRAVENOUS
  Filled 2011-02-17: qty 1

## 2011-02-17 MED ORDER — SODIUM CHLORIDE 0.9 % IV BOLUS (SEPSIS)
1000.0000 mL | Freq: Once | INTRAVENOUS | Status: AC
  Administered 2011-02-17 (×2): 1000 mL via INTRAVENOUS

## 2011-02-17 MED ORDER — FENTANYL CITRATE 0.05 MG/ML IJ SOLN
100.0000 ug | Freq: Once | INTRAMUSCULAR | Status: AC
  Administered 2011-02-17: 100 ug via INTRAVENOUS
  Filled 2011-02-17: qty 2

## 2011-02-17 MED ORDER — BUTALBITAL-ASA-CAFF-CODEINE 50-325-40-30 MG PO CAPS: 1.0000 | ORAL_CAPSULE | ORAL | Status: AC | PRN

## 2011-02-17 MED ORDER — SODIUM CHLORIDE 0.9 % IV SOLN: 999.0000 mL | Freq: Once | INTRAVENOUS | Status: DC

## 2011-02-17 MED ORDER — DIPHENHYDRAMINE HCL 50 MG/ML IJ SOLN
12.5000 mg | Freq: Once | INTRAMUSCULAR | Status: AC
  Administered 2011-02-17: 12.5 mg via INTRAVENOUS
  Filled 2011-02-17: qty 1

## 2011-02-17 NOTE — ED Notes (Signed)
Pt has chronic vomiting and diarrhea but started vomiting Tuesday night and is unable to keep anything down.  Symptoms are unrlieved after taking Phenergan.  Has GI appt 03/20/2011

## 2011-02-17 NOTE — ED Notes (Signed)
Pt reports cont'd pain when vomits-stated she has had this hx since age 25-advised to keep GI appt

## 2011-02-17 NOTE — Progress Notes (Signed)
No vomiting or diarrhea since medications given. Pain improved, but not completely resolved. Discussed plan with pt. She is scheduled to see GI next month.

## 2011-02-17 NOTE — ED Notes (Signed)
Pt resting/talking on phone-2nd L NS started at 125cc/hr per EDPA Marshell Levan order

## 2011-02-17 NOTE — ED Provider Notes (Signed)
History     CSN: [id number] Arrival date & time: 02/17/2011 11:31 AM  Chief Complaint  Patient presents with  . Emesis  . Diarrhea  . Abdominal Pain   Patient is a 25 y.o. female presenting with vomiting, diarrhea, and abdominal pain. The history is provided by the patient.  Emesis  This is a recurrent problem. The current episode started more than 2 days ago. The problem occurs more than 10 times per day. The problem has been gradually worsening. The maximum temperature recorded prior to her arrival was 100 to 100.9 F. Associated symptoms include abdominal pain, chills and diarrhea. Pertinent negatives include no arthralgias and no cough.  Diarrhea The primary symptoms include abdominal pain, vomiting and diarrhea. Primary symptoms do not include dysuria or arthralgias. The illness began 3 to 5 days ago. The onset was gradual. The problem has been gradually worsening.  The illness is also significant for chills. The illness does not include back pain. Significant associated medical issues include gallstones. Associated medical issues do not include liver disease, alcohol abuse, PUD, bowel resection or irritable bowel syndrome. Associated medical issues comments: cyclic vomiting syndrome.  Abdominal Pain The primary symptoms of the illness include abdominal pain, vomiting and diarrhea. The primary symptoms of the illness do not include shortness of breath or dysuria.  Additional symptoms associated with the illness include chills. Symptoms associated with the illness do not include hematuria, frequency or back pain. Significant associated medical issues include gallstones. Significant associated medical issues do not include PUD or liver disease. Associated medical issues comments: cyclic vomiting syndrome.    Past Medical History  Diagnosis Date  . Hypertension   . Cyclical vomiting     Past Surgical History  Procedure Date  . Tonsillectomy   . Tubal ligation   . Cesarean section   .  Cholecystectomy   . Cesarean section   . Tubal ligation     History reviewed. No pertinent family history.  History  Substance Use Topics  . Smoking status: Never Smoker   . Smokeless tobacco: Not on file  . Alcohol Use: No    OB History    Grav Para Term Preterm Abortions TAB SAB Ect Mult Living                  Review of Systems  Constitutional: Positive for chills. Negative for activity change.       All ROS Neg except as noted in HPI  HENT: Negative for nosebleeds and neck pain.   Eyes: Negative for photophobia and discharge.  Respiratory: Negative for cough, shortness of breath and wheezing.   Cardiovascular: Negative for chest pain and palpitations.  Gastrointestinal: Positive for vomiting, abdominal pain and diarrhea. Negative for blood in stool.  Genitourinary: Negative for dysuria, frequency and hematuria.  Musculoskeletal: Negative for back pain and arthralgias.  Skin: Negative.   Neurological: Negative for dizziness, seizures and speech difficulty.  Psychiatric/Behavioral: Negative for hallucinations and confusion.    Physical Exam  BP 128/109  Pulse 85  Temp(Src) 99.7 F (37.6 C) (Oral)  Resp 18  LMP 02/01/2011  Physical Exam  Nursing note and vitals reviewed. Constitutional: She is oriented to person, place, and time. She appears well-developed and well-nourished.  Non-toxic appearance.  HENT:  Head: Normocephalic.  Right Ear: Tympanic membrane and external ear normal.  Left Ear: Tympanic membrane and external ear normal.  Eyes: EOM and lids are normal. Pupils are equal, round, and reactive to light.  Neck: Normal range of  motion. Neck supple. Carotid bruit is not present.  Cardiovascular: Normal rate, regular rhythm, normal heart sounds, intact distal pulses and normal pulses.   Pulmonary/Chest: Breath sounds normal. No respiratory distress.  Abdominal: Soft. Bowel sounds are normal. There is tenderness. There is no guarding.       RUQ pain to  palpation. Mild to mod epigastric and RLQ pain. No mass. No guarding.  Musculoskeletal: Normal range of motion.  Lymphadenopathy:       Head (right side): No submandibular adenopathy present.       Head (left side): No submandibular adenopathy present.    She has no cervical adenopathy.  Neurological: She is alert and oriented to person, place, and time. She has normal strength. No cranial nerve deficit or sensory deficit.  Skin: Skin is warm and dry.  Psychiatric: She has a normal mood and affect. Her speech is normal.    ED Course  Procedures  MDM I have reviewed nursing notes, vital signs, and all appropriate lab and imaging results for this patient. Suspect recurrence of Cyclic Vomiting Syndrome. Will evaluate for UTI, Kidney stone, other GI disorders.      Lenox Ahr, [place] 02/17/11 1227

## 2011-02-17 NOTE — ED Notes (Signed)
Pt called back in after d/c-requested RTW note-EDPA Sherry Key for RTW 02/19/11-written note done-pt advised of need to pick up note

## 2011-02-19 NOTE — ED Provider Notes (Signed)
Medical screening examination/treatment/procedure(s) were performed by non-physician practitioner and as supervising physician I was immediately available for consultation/collaboration.   Dot Lanes, MD 02/19/11 [phone]

## 2011-02-27 ENCOUNTER — Emergency Department (HOSPITAL_BASED_OUTPATIENT_CLINIC_OR_DEPARTMENT_OTHER)
Admission: EM | Admit: 2011-02-27 | Discharge: 2011-02-28 | Disposition: A | Discharge status: Home / Self Care | Payer: PRIVATE HEALTH INSURANCE | Attending: Emergency Medicine | Admitting: Emergency Medicine

## 2011-02-27 ENCOUNTER — Encounter (HOSPITAL_BASED_OUTPATIENT_CLINIC_OR_DEPARTMENT_OTHER): Payer: Self-pay | Admitting: *Deleted

## 2011-02-27 DIAGNOSIS — N390 Urinary tract infection, site not specified: Secondary | ICD-10-CM | POA: Insufficient documentation

## 2011-02-27 DIAGNOSIS — R112 Nausea with vomiting, unspecified: Secondary | ICD-10-CM | POA: Insufficient documentation

## 2011-02-27 DIAGNOSIS — R0602 Shortness of breath: Secondary | ICD-10-CM | POA: Insufficient documentation

## 2011-02-27 DIAGNOSIS — I10 Essential (primary) hypertension: Secondary | ICD-10-CM | POA: Insufficient documentation

## 2011-02-27 DIAGNOSIS — E876 Hypokalemia: Secondary | ICD-10-CM | POA: Insufficient documentation

## 2011-02-27 MED ORDER — HALOPERIDOL LACTATE 5 MG/ML IJ SOLN
2.5000 mg | Freq: Once | INTRAMUSCULAR | Status: AC
  Administered 2011-02-27: 2.5 mg via INTRAVENOUS
  Filled 2011-02-27: qty 1

## 2011-02-27 MED ORDER — DIPHENHYDRAMINE HCL 50 MG/ML IJ SOLN
25.0000 mg | Freq: Once | INTRAMUSCULAR | Status: AC
  Administered 2011-02-27: 25 mg via INTRAVENOUS
  Filled 2011-02-27: qty 1

## 2011-02-27 MED ORDER — SODIUM CHLORIDE 0.9 % IV SOLN: Freq: Once | INTRAVENOUS | Status: DC

## 2011-02-27 MED ORDER — KETOROLAC TROMETHAMINE 30 MG/ML IJ SOLN
30.0000 mg | Freq: Once | INTRAMUSCULAR | Status: AC
  Administered 2011-02-28: 30 mg via INTRAVENOUS
  Filled 2011-02-27: qty 1

## 2011-02-27 MED ORDER — PROMETHAZINE HCL 25 MG/ML IJ SOLN
25.0000 mg | Freq: Once | INTRAMUSCULAR | Status: AC
  Administered 2011-02-27: 25 mg via INTRAVENOUS
  Filled 2011-02-27: qty 1

## 2011-02-27 MED ORDER — SODIUM CHLORIDE 0.9 % IV BOLUS (SEPSIS)
1000.0000 mL | Freq: Once | INTRAVENOUS | Status: AC
  Administered 2011-02-27: 1000 mL via INTRAVENOUS

## 2011-02-27 NOTE — ED Notes (Signed)
PT c/o vomiting x 2 weeks , seen here last week for same

## 2011-02-27 NOTE — ED Provider Notes (Signed)
History     CSN: [id number] Arrival date & time: 02/27/2011 10:46 PM  Chief Complaint  Patient presents with  . Emesis  . Shortness of Breath   HPI Comments: Patient presents with greater than 2 weeks of nausea, vomiting or diarrhea. She has a history of cyclic vomiting syndrome and falsehood GI. She saw her GI doctor 3 days ago but reports she is not any better. She is using Phenergan at home distal vomiting about every hour with diarrhea every hour as well. There is no blood in her emesis or stool. Has not had any fevers at home. Abdominal pain is epigastric and typical of her cyclic vomiting syndrome.  She is scheduled for an endoscopy in September.  The history is provided by the patient.    Past Medical History  Diagnosis Date  . Hypertension   . Cyclical vomiting     Past Surgical History  Procedure Date  . Tonsillectomy   . Tubal ligation   . Cesarean section   . Cholecystectomy   . Cesarean section   . Tubal ligation     History reviewed. No pertinent family history.  History  Substance Use Topics  . Smoking status: Never Smoker   . Smokeless tobacco: Not on file  . Alcohol Use: No    OB History    Grav Para Term Preterm Abortions TAB SAB Ect Mult Living                  Review of Systems  Constitutional: Positive for appetite change. Negative for activity change.  HENT: Negative for congestion and rhinorrhea.   Respiratory: Negative for shortness of breath.   Gastrointestinal: Positive for nausea, vomiting, abdominal pain and diarrhea.  Genitourinary: Negative for dysuria and hematuria.  Musculoskeletal: Negative for back pain.  Neurological: Negative for headaches.    Physical Exam  BP 142/98  Pulse 117  Temp(Src) 100.3 F (37.9 C) (Oral)  Resp 16  SpO2 100%  LMP 02/01/2011  Physical Exam  Constitutional: She is oriented to person, place, and time. She appears well-developed and well-nourished. No distress.  HENT:  Head: Normocephalic and  atraumatic.  Mouth/Throat: Oropharynx is clear and moist. No oropharyngeal exudate.  Eyes: Conjunctivae are normal. Pupils are equal, round, and reactive to light.  Neck: Normal range of motion.  Cardiovascular: Normal rate, regular rhythm and normal heart sounds.   Pulmonary/Chest: Effort normal and breath sounds normal. No respiratory distress.  Abdominal: Soft. There is tenderness. There is no rebound and no guarding.       Mild epigastric tenderness  Musculoskeletal: Normal range of motion. She exhibits no edema and no tenderness.  Neurological: She is alert and oriented to person, place, and time. No cranial nerve deficit.  Skin: Skin is warm.    ED Course  Procedures  MDM Nausea, vomiting, abdominal pain, similar to previous.  Abdomen soft and nonsurgical. UA, chem7, IVF, symptom control   Results for orders placed during the hospital encounter of 72/09/47  BASIC METABOLIC PANEL      Component Value Range   Sodium 138  135 - 145 (mEq/L)   Potassium 3.0 (*) 3.5 - 5.1 (mEq/L)   Chloride 100  96 - 112 (mEq/L)   CO2 24  19 - 32 (mEq/L)   Glucose, Bld 118 (*) 70 - 99 (mg/dL)   BUN 16  6 - 23 (mg/dL)   Creatinine, Ser 1.10  0.50 - 1.10 (mg/dL)   Calcium 9.9  8.4 - 10.5 (mg/dL)  GFR calc non Af Amer >60  >60 (mL/min)   GFR calc Af Amer >60  >60 (mL/min)  URINALYSIS, ROUTINE W REFLEX MICROSCOPIC      Component Value Range   Color, Urine AMBER (*) YELLOW    Appearance CLOUDY (*) CLEAR    Specific Gravity, Urine 1.029  1.005 - 1.030    pH 5.5  5.0 - 8.0    Glucose, UA 100 (*) NEGATIVE (mg/dL)   Hgb urine dipstick LARGE (*) NEGATIVE    Bilirubin Urine SMALL (*) NEGATIVE    Ketones, ur 15 (*) NEGATIVE (mg/dL)   Protein, ur >300 (*) NEGATIVE (mg/dL)   Urobilinogen, UA 0.2  0.0 - 1.0 (mg/dL)   Nitrite NEGATIVE  NEGATIVE    Leukocytes, UA TRACE (*) NEGATIVE   PREGNANCY, URINE      Component Value Range   Preg Test, Ur NEGATIVE    URINE MICROSCOPIC-ADD ON      Component  Value Range   Squamous Epithelial / LPF RARE  RARE    WBC, UA 7-10  <3 (WBC/hpf)   RBC / HPF TOO NUMEROUS TO COUNT  <3 (RBC/hpf)   Bacteria, UA MANY (*) RARE    Casts HYALINE CASTS (*) NEGATIVE    No results found.   Symptomatic improvement.  Abdomen soft.  Potassium replaced. Possible UTI.  Ezequiel Essex, MD 02/28/11 [phone]

## 2011-02-28 LAB — PREGNANCY, URINE: Preg Test, Ur: NEGATIVE

## 2011-02-28 LAB — URINE MICROSCOPIC-ADD ON

## 2011-02-28 LAB — BASIC METABOLIC PANEL
BUN: 16 mg/dL (ref 6–23)
Chloride: 100 mEq/L (ref 96–112)
GFR calc Af Amer: >60 mL/min (ref >60)
Potassium: 3 mEq/L — ABNORMAL LOW (ref 3.5–5.1)

## 2011-02-28 LAB — URINALYSIS, ROUTINE W REFLEX MICROSCOPIC
Nitrite: NEGATIVE
Specific Gravity, Urine: 1.029 (ref 1.005–1.030)
pH: 5.5 (ref 5.0–8.0)

## 2011-02-28 MED ORDER — POTASSIUM CHLORIDE ER 10 MEQ PO TBCR: 10.0000 meq | EXTENDED_RELEASE_TABLET | Freq: Two times a day (BID) | ORAL | Status: DC

## 2011-02-28 MED ORDER — POTASSIUM CHLORIDE CRYS ER 20 MEQ PO TBCR
40.0000 meq | EXTENDED_RELEASE_TABLET | Freq: Once | ORAL | Status: AC
  Administered 2011-02-28: 40 meq via ORAL
  Filled 2011-02-28: qty 2

## 2011-02-28 MED ORDER — NITROFURANTOIN MONOHYD MACRO 100 MG PO CAPS: 100.0000 mg | ORAL_CAPSULE | Freq: Two times a day (BID) | ORAL | Status: AC

## 2011-02-28 NOTE — ED Notes (Signed)
Pt asked for something for pain, notified RN of this. Upon reentering the room noted  pt falling a sleep. Notified RN of this as well.

## 2011-03-28 ENCOUNTER — Emergency Department (HOSPITAL_COMMUNITY)
Admission: EM | Admit: 2011-03-28 | Discharge: 2011-03-28 | Disposition: A | Discharge status: Home / Self Care | Payer: PRIVATE HEALTH INSURANCE | Attending: Emergency Medicine | Admitting: Emergency Medicine

## 2011-03-28 DIAGNOSIS — R1013 Epigastric pain: Secondary | ICD-10-CM | POA: Insufficient documentation

## 2011-03-28 DIAGNOSIS — R112 Nausea with vomiting, unspecified: Secondary | ICD-10-CM | POA: Insufficient documentation

## 2011-03-28 DIAGNOSIS — Z79899 Other long term (current) drug therapy: Secondary | ICD-10-CM | POA: Insufficient documentation

## 2011-03-28 DIAGNOSIS — I10 Essential (primary) hypertension: Secondary | ICD-10-CM | POA: Insufficient documentation

## 2011-03-28 LAB — BASIC METABOLIC PANEL
Chloride: 107 mEq/L (ref 96–112)
Creatinine, Ser: 0.55 mg/dL (ref 0.50–1.10)
GFR calc Af Amer: >60 mL/min (ref >60)
GFR calc non Af Amer: >60 mL/min (ref >60)
Potassium: 3.5 mEq/L (ref 3.5–5.1)

## 2011-03-28 LAB — CBC
HCT: 34.5 % — ABNORMAL LOW (ref 36.0–46.0)
MCH: 29.2 pg (ref 26.0–34.0)
MCV: 86 fL (ref 78.0–100.0)
Platelets: 285 10*3/uL (ref 150–400)
RDW: 12.9 % (ref 11.5–15.5)

## 2011-03-28 LAB — HEPATIC FUNCTION PANEL
ALT: 10 U/L (ref 0–35)
Bilirubin, Direct: <0.1 mg/dL (ref 0.0–0.3)
Total Bilirubin: 0.4 mg/dL (ref 0.3–1.2)

## 2011-03-28 LAB — DIFFERENTIAL
Eosinophils Absolute: 0 10*3/uL (ref 0.0–0.7)
Eosinophils Relative: 0 % (ref 0–5)
Lymphs Abs: 0.7 10*3/uL (ref 0.7–4.0)
Monocytes Relative: 3 % (ref 3–12)

## 2011-03-28 LAB — LIPASE, BLOOD: Lipase: 35 U/L (ref 11–59)

## 2011-04-20 ENCOUNTER — Emergency Department (HOSPITAL_BASED_OUTPATIENT_CLINIC_OR_DEPARTMENT_OTHER)
Admission: EM | Admit: 2011-04-20 | Discharge: 2011-04-20 | Disposition: A | Discharge status: Home / Self Care | Payer: PRIVATE HEALTH INSURANCE | Attending: Emergency Medicine | Admitting: Emergency Medicine

## 2011-04-20 ENCOUNTER — Encounter (HOSPITAL_BASED_OUTPATIENT_CLINIC_OR_DEPARTMENT_OTHER): Payer: Self-pay | Admitting: *Deleted

## 2011-04-20 DIAGNOSIS — I10 Essential (primary) hypertension: Secondary | ICD-10-CM | POA: Insufficient documentation

## 2011-04-20 DIAGNOSIS — R1110 Vomiting, unspecified: Secondary | ICD-10-CM | POA: Insufficient documentation

## 2011-04-20 LAB — URINALYSIS, ROUTINE W REFLEX MICROSCOPIC
Bilirubin Urine: NEGATIVE
Nitrite: NEGATIVE
Protein, ur: 30 mg/dL — AB
Specific Gravity, Urine: 1.023 (ref 1.005–1.030)
Urobilinogen, UA: 0.2 mg/dL (ref 0.0–1.0)

## 2011-04-20 LAB — CBC
Hemoglobin: 13.8 g/dL (ref 12.0–15.0)
MCH: 29.4 pg (ref 26.0–34.0)
MCHC: 35 g/dL (ref 30.0–36.0)
RDW: 12.9 % (ref 11.5–15.5)

## 2011-04-20 LAB — DIFFERENTIAL
Basophils Relative: 0 % (ref 0–1)
Eosinophils Absolute: 0 10*3/uL (ref 0.0–0.7)
Monocytes Relative: 2 % — ABNORMAL LOW (ref 3–12)
Neutrophils Relative %: 94 % — ABNORMAL HIGH (ref 43–77)

## 2011-04-20 LAB — BASIC METABOLIC PANEL
BUN: 6 mg/dL (ref 6–23)
Creatinine, Ser: 0.6 mg/dL (ref 0.50–1.10)
GFR calc Af Amer: >90 mL/min (ref >90)
GFR calc non Af Amer: >90 mL/min (ref >90)
Potassium: 3.4 mEq/L — ABNORMAL LOW (ref 3.5–5.1)

## 2011-04-20 LAB — URINE MICROSCOPIC-ADD ON

## 2011-04-20 MED ORDER — PROMETHAZINE HCL 25 MG/ML IJ SOLN
12.5000 mg | Freq: Once | INTRAMUSCULAR | Status: AC
  Administered 2011-04-20: 12.5 mg via INTRAVENOUS
  Filled 2011-04-20: qty 1

## 2011-04-20 MED ORDER — HYDROMORPHONE HCL 1 MG/ML IJ SOLN
1.0000 mg | Freq: Once | INTRAMUSCULAR | Status: AC
  Administered 2011-04-20: 1 mg via INTRAVENOUS
  Filled 2011-04-20: qty 1

## 2011-04-20 NOTE — ED Provider Notes (Signed)
History     CSN: [id number] Arrival date & time: 04/20/2011  5:42 PM  Chief Complaint  Patient presents with  . Emesis    (Consider location/radiation/quality/duration/timing/severity/associated sxs/prior treatment) Patient is a 25 y.o. female presenting with vomiting. The history is provided by the patient. No language interpreter was used.  Emesis  The current episode started yesterday. The problem occurs continuously. The problem has not changed since onset.The emesis has an appearance of stomach contents. There has been no fever. Associated symptoms include abdominal pain and diarrhea. Risk factors: hx of same.  Pt reports she has cyclical vomitting syndrome.  Pt reports she responds well to phenergan and dilaudid.  Pt reports this is typical of her episodes.  Pt denies fever or chills,   Past Medical History  Diagnosis Date  . Hypertension   . Cyclical vomiting     Past Surgical History  Procedure Date  . Tonsillectomy   . Tubal ligation   . Cesarean section   . Cholecystectomy   . Cesarean section   . Tubal ligation     No family history on file.  History  Substance Use Topics  . Smoking status: Never Smoker   . Smokeless tobacco: Not on file  . Alcohol Use: No    OB History    Grav Para Term Preterm Abortions TAB SAB Ect Mult Living                  Review of Systems  Gastrointestinal: Positive for nausea, vomiting, abdominal pain and diarrhea.  All other systems reviewed and are negative.    Allergies  Peanut-containing drug products; Percocet; and Zofran  Home Medications   Current Outpatient Rx  Name Route Sig Dispense Refill  . FLINTSTONES COMPLETE 60 MG PO CHEW Oral Chew 1 tablet by mouth daily.      Marland Kitchen LABETALOL HCL 100 MG PO TABS Oral Take 100 mg by mouth 2 (two) times daily.      . ATIVAN PO Oral Take 1 tablet by mouth 2 (two) times daily.      Marland Kitchen POTASSIUM CHLORIDE CR 10 MEQ PO TBCR Oral Take 1 tablet (10 mEq total) by mouth 2 (two) times  daily. 6 tablet 0  . PROMETHAZINE HCL 25 MG RE SUPP Rectal Place 25 mg rectally every 6 (six) hours as needed. For nausea and vomiting    . PROMETHAZINE HCL 25 MG PO TABS Oral Take 25 mg by mouth every 6 (six) hours as needed. Nausea and vomiting       BP 154/111  Pulse 107  Temp 98.7 F (37.1 C)  Resp 20  SpO2 100%  LMP 04/20/2011  Physical Exam  Nursing note and vitals reviewed. Constitutional: She is oriented to person, place, and time. She appears well-developed and well-nourished.  HENT:  Head: Normocephalic and atraumatic.  Eyes: Pupils are equal, round, and reactive to light.  Neck: Normal range of motion.  Cardiovascular: Normal rate.   Pulmonary/Chest: Effort normal.  Abdominal: Soft. There is tenderness.  Musculoskeletal: Normal range of motion.  Neurological: She is alert and oriented to person, place, and time.  Skin: Skin is warm.  Psychiatric: She has a normal mood and affect.    ED Course  Procedures (including critical care time)  Labs Reviewed  CBC - Abnormal; Notable for the following:    WBC 15.4 (*)    All other components within normal limits  DIFFERENTIAL - Abnormal; Notable for the following:    Neutrophils Relative 94 (*)  Neutro Abs 14.4 (*)    Lymphocytes Relative 4 (*)    Lymphs Abs 0.6 (*)    Monocytes Relative 2 (*)    All other components within normal limits  BASIC METABOLIC PANEL - Abnormal; Notable for the following:    Potassium 3.4 (*)    Glucose, Bld 116 (*)    All other components within normal limits  URINALYSIS, ROUTINE W REFLEX MICROSCOPIC  PREGNANCY, URINE   No results found.   No diagnosis found.    MDM  Ua show ketones,  Pt given Iv fluid,  Pt given dilaudid and phenergan.  Pt advised to see her MD for recheck tomorrow.        Alyse Low, [place] 04/20/11 2038

## 2011-04-20 NOTE — ED Notes (Signed)
Pt says she has cyclic vomiting syndrome and she has not been able to control it since last thursday

## 2011-04-21 NOTE — ED Provider Notes (Signed)
Medical screening examination/treatment/procedure(s) were performed by non-physician practitioner and as supervising physician I was immediately available for consultation/collaboration.  Chauncy Passy, MD 04/21/11 [phone]

## 2011-04-22 ENCOUNTER — Other Ambulatory Visit: Payer: Self-pay

## 2011-04-22 ENCOUNTER — Emergency Department (HOSPITAL_BASED_OUTPATIENT_CLINIC_OR_DEPARTMENT_OTHER)
Admission: EM | Admit: 2011-04-22 | Discharge: 2011-04-22 | Disposition: A | Discharge status: Home / Self Care | Payer: PRIVATE HEALTH INSURANCE | Attending: Emergency Medicine | Admitting: Emergency Medicine

## 2011-04-22 ENCOUNTER — Encounter (HOSPITAL_BASED_OUTPATIENT_CLINIC_OR_DEPARTMENT_OTHER): Payer: Self-pay | Admitting: *Deleted

## 2011-04-22 DIAGNOSIS — R1110 Vomiting, unspecified: Secondary | ICD-10-CM | POA: Insufficient documentation

## 2011-04-22 DIAGNOSIS — Z79899 Other long term (current) drug therapy: Secondary | ICD-10-CM | POA: Insufficient documentation

## 2011-04-22 DIAGNOSIS — R109 Unspecified abdominal pain: Secondary | ICD-10-CM | POA: Insufficient documentation

## 2011-04-22 DIAGNOSIS — I10 Essential (primary) hypertension: Secondary | ICD-10-CM | POA: Insufficient documentation

## 2011-04-22 MED ORDER — PROMETHAZINE HCL 25 MG/ML IJ SOLN
25.0000 mg | Freq: Once | INTRAMUSCULAR | Status: AC
  Administered 2011-04-22: 25 mg via INTRAVENOUS
  Filled 2011-04-22: qty 1

## 2011-04-22 MED ORDER — HYDROMORPHONE HCL 1 MG/ML IJ SOLN
1.0000 mg | Freq: Once | INTRAMUSCULAR | Status: AC
  Administered 2011-04-22: 1 mg via INTRAVENOUS
  Filled 2011-04-22: qty 1

## 2011-04-22 MED ORDER — SODIUM CHLORIDE 0.9 % IV BOLUS (SEPSIS)
1000.0000 mL | Freq: Once | INTRAVENOUS | Status: AC
  Administered 2011-04-22: 1000 mL via INTRAVENOUS

## 2011-04-22 MED ORDER — LORAZEPAM 2 MG/ML IJ SOLN
1.0000 mg | Freq: Once | INTRAMUSCULAR | Status: AC
  Administered 2011-04-22: 1 mg via INTRAVENOUS
  Filled 2011-04-22: qty 1

## 2011-04-22 MED ORDER — DIPHENHYDRAMINE HCL 50 MG/ML IJ SOLN
25.0000 mg | Freq: Once | INTRAMUSCULAR | Status: AC
  Administered 2011-04-22: 25 mg via INTRAVENOUS
  Filled 2011-04-22: qty 1

## 2011-04-22 MED ORDER — DROPERIDOL 2.5 MG/ML IJ SOLN
1.2500 mg | Freq: Once | INTRAMUSCULAR | Status: AC
  Administered 2011-04-22: 1.25 mg via INTRAVENOUS
  Filled 2011-04-22: qty 2

## 2011-04-22 NOTE — ED Notes (Signed)
Awaiting ride - pt resting - appears comfortable

## 2011-04-22 NOTE — ED Notes (Signed)
Upon entering room to d/c pt, pt c/o nausea, dry heaving noted. Pt sts she thinks she drank "too fast"- MD made aware.

## 2011-04-22 NOTE — ED Notes (Signed)
BP checked x 2 when pt roomed.

## 2011-04-22 NOTE — ED Provider Notes (Addendum)
History     CSN: [id number] Arrival date & time: 04/22/2011  1:38 PM  Chief Complaint  Patient presents with  . Emesis    (Consider location/radiation/quality/duration/timing/severity/associated sxs/prior treatment) HPI Comments: Patient presents with symptoms consistent with her cyclic vomiting syndrome. Patient was notably here 2 days ago for similar symptoms. She did receive Dilaudid and Phenergan x2 doses with good relief of her symptoms and was discharged home. She did have a urinalysis and urine pregnancy test at that time and her urine pregnancy test was negative. Patient notes that she is on her menses currently. She states her symptoms are similar to her symptoms with abdominal pain and emesis that is barely her stomach contents. No new fevers. She's tried taking her Phenergan suppositories at home and has been unable to use these to control her symptoms.  Patient is a 25 y.o. female presenting with vomiting. The history is provided by the patient. No language interpreter was used.  Emesis  This is a recurrent problem. The current episode started yesterday. The problem has not changed since onset.The emesis has an appearance of stomach contents. There has been no fever. Associated symptoms include abdominal pain. Pertinent negatives include no chills, no cough, no diarrhea, no fever and no headaches.    Past Medical History  Diagnosis Date  . Hypertension   . Cyclical vomiting     Past Surgical History  Procedure Date  . Tonsillectomy   . Tubal ligation   . Cesarean section   . Cholecystectomy   . Cesarean section   . Tubal ligation     History reviewed. No pertinent family history.  History  Substance Use Topics  . Smoking status: Never Smoker   . Smokeless tobacco: Not on file  . Alcohol Use: No    OB History    Grav Para Term Preterm Abortions TAB SAB Ect Mult Living                  Review of Systems  Constitutional: Negative.  Negative for fever and  chills.  HENT: Negative.   Eyes: Negative.  Negative for discharge and redness.  Respiratory: Negative.  Negative for cough and shortness of breath.   Cardiovascular: Negative.  Negative for chest pain.  Gastrointestinal: Positive for nausea, vomiting and abdominal pain. Negative for diarrhea.  Genitourinary: Negative.  Negative for dysuria and vaginal discharge.  Musculoskeletal: Negative.  Negative for back pain.  Skin: Negative.  Negative for color change and rash.  Neurological: Negative.  Negative for syncope and headaches.  Hematological: Negative.  Negative for adenopathy.  Psychiatric/Behavioral: Negative.  Negative for confusion.  All other systems reviewed and are negative.    Allergies  Peanut-containing drug products; Percocet; and Zofran  Home Medications   Current Outpatient Rx  Name Route Sig Dispense Refill  . FLINTSTONES COMPLETE 60 MG PO CHEW Oral Chew 1 tablet by mouth daily.      Marland Kitchen LABETALOL HCL 100 MG PO TABS Oral Take 100 mg by mouth 2 (two) times daily.      . ATIVAN PO Oral Take 1 tablet by mouth 2 (two) times daily.      Marland Kitchen POTASSIUM CHLORIDE CR 10 MEQ PO TBCR Oral Take 1 tablet (10 mEq total) by mouth 2 (two) times daily. 6 tablet 0  . PROMETHAZINE HCL 25 MG RE SUPP Rectal Place 25 mg rectally every 6 (six) hours as needed. For nausea and vomiting    . PROMETHAZINE HCL 25 MG PO TABS Oral Take  25 mg by mouth every 6 (six) hours as needed. Nausea and vomiting       BP 163/102  Pulse 89  Temp(Src) 98.2 F (36.8 C) (Oral)  Resp 20  Ht 5' 3"  (1.6 m)  Wt 140 lb (63.504 kg)  BMI 24.80 kg/m2  SpO2 100%  LMP 04/20/2011  Physical Exam  Constitutional: She is oriented to person, place, and time. She appears well-developed and well-nourished.  Non-toxic appearance. She does not have a sickly appearance.  HENT:  Head: Normocephalic and atraumatic.  Eyes: Conjunctivae, EOM and lids are normal. Pupils are equal, round, and reactive to light. No scleral icterus.   Neck: Trachea normal and normal range of motion. Neck supple.  Cardiovascular: Normal rate, regular rhythm and normal heart sounds.   Pulmonary/Chest: Effort normal and breath sounds normal. No respiratory distress. She has no wheezes. She has no rales. She exhibits no tenderness.  Abdominal: Soft. Normal appearance. There is no tenderness. There is no rebound, no guarding and no CVA tenderness.  Musculoskeletal: Normal range of motion.  Neurological: She is alert and oriented to person, place, and time. She has normal strength.  Skin: Skin is warm, dry and intact. No rash noted.  Psychiatric: She has a normal mood and affect. Her behavior is normal. Judgment and thought content normal.    ED Course  Procedures (including critical care time)  Labs Reviewed - No data to display No results found.   No diagnosis found.    MDM  Patient symptoms of significantly improved after the medications here in the emergency department. She has no further pain or nausea. She is actually able to keep liquids down and ice chips without any difficulty and feels comfortable going home at this point in time. She has her Phenergan home as well.        Lezlie Octave, MD 04/22/11 1708  Patient was being discharged and began having some nausea and vomiting symptoms again. Her IV had not even been discontinued. Another dose of Phenergan has been ordered to attempt to regain control of her nausea vomiting symptoms.  Patient an EKG checked to check her QT before giving her droperidol. Her QT was not prolong so droperidol was given. Since receiving this medicine she has had no further nausea or vomiting. At this point in time and given patient a period of rest as she is now sleepy prior to likely discharge home.   Date: 04/22/2011  Rate: 112  Rhythm: sinus tachycardia  QRS Axis: normal  Intervals: normal  ST/T Wave abnormalities: normal  Conduction Disutrbances:none  Narrative Interpretation:    Old EKG Reviewed: none available    Lezlie Octave, MD 04/22/11 1932

## 2011-04-22 NOTE — ED Notes (Signed)
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 Vomiting at triage.

## 2011-04-24 ENCOUNTER — Emergency Department (HOSPITAL_BASED_OUTPATIENT_CLINIC_OR_DEPARTMENT_OTHER)
Admission: EM | Admit: 2011-04-24 | Discharge: 2011-04-24 | Disposition: A | Discharge status: Home / Self Care | Payer: PRIVATE HEALTH INSURANCE | Attending: Emergency Medicine | Admitting: Emergency Medicine

## 2011-04-24 ENCOUNTER — Encounter (HOSPITAL_BASED_OUTPATIENT_CLINIC_OR_DEPARTMENT_OTHER): Payer: Self-pay

## 2011-04-24 DIAGNOSIS — I10 Essential (primary) hypertension: Secondary | ICD-10-CM | POA: Insufficient documentation

## 2011-04-24 DIAGNOSIS — R1115 Cyclical vomiting syndrome unrelated to migraine: Secondary | ICD-10-CM | POA: Insufficient documentation

## 2011-04-24 MED ORDER — PROMETHAZINE HCL 25 MG/ML IJ SOLN
25.0000 mg | Freq: Once | INTRAMUSCULAR | Status: AC
  Administered 2011-04-24: 25 mg via INTRAVENOUS

## 2011-04-24 MED ORDER — HYDROMORPHONE HCL 1 MG/ML IJ SOLN
INTRAMUSCULAR | Status: AC
  Administered 2011-04-24: 1 mg via INTRAVENOUS
  Filled 2011-04-24: qty 1

## 2011-04-24 MED ORDER — SODIUM CHLORIDE 0.9 % IV BOLUS (SEPSIS)
1000.0000 mL | Freq: Once | INTRAVENOUS | Status: AC
  Administered 2011-04-24: 1000 mL via INTRAVENOUS

## 2011-04-24 MED ORDER — PROMETHAZINE HCL 25 MG/ML IJ SOLN: 25.0000 mg | Freq: Once | INTRAMUSCULAR | Status: DC

## 2011-04-24 MED ORDER — LORAZEPAM 2 MG/ML IJ SOLN
1.0000 mg | Freq: Once | INTRAMUSCULAR | Status: AC
  Administered 2011-04-24: 1 mg via INTRAVENOUS

## 2011-04-24 MED ORDER — HYDROMORPHONE HCL 4 MG PO TABS: 4.0000 mg | ORAL_TABLET | ORAL | Status: AC | PRN

## 2011-04-24 MED ORDER — LABETALOL HCL 5 MG/ML IV SOLN: 20.0000 mg | Freq: Once | INTRAVENOUS | Status: DC

## 2011-04-24 MED ORDER — HYDROMORPHONE HCL 1 MG/ML IJ SOLN
1.0000 mg | Freq: Once | INTRAMUSCULAR | Status: AC
  Administered 2011-04-24: 1 mg via INTRAVENOUS

## 2011-04-24 MED ORDER — HYDROMORPHONE HCL 1 MG/ML IJ SOLN: 1.0000 mg | Freq: Once | INTRAMUSCULAR | Status: DC

## 2011-04-24 MED ORDER — PROMETHAZINE HCL 25 MG/ML IJ SOLN
INTRAMUSCULAR | Status: AC
  Administered 2011-04-24: 25 mg via INTRAVENOUS
  Filled 2011-04-24: qty 1

## 2011-04-24 MED ORDER — LORAZEPAM 2 MG/ML IJ SOLN
INTRAMUSCULAR | Status: AC
  Administered 2011-04-24: 1 mg via INTRAVENOUS
  Filled 2011-04-24: qty 1

## 2011-04-24 NOTE — ED Notes (Signed)
Dr Florina Ou notified that pt's blood pressure is normotensive, order for labetalol was discontinued.  Pt states that pain has not decreased, and she also states that her nausea has not subsided.  Pt was had to be stimulated to wake up upon entering the room.

## 2011-04-24 NOTE — ED Notes (Signed)
Per Dr Florina Ou request, discussed with patient admission vs. D/c to home.  Upon entering room, pt was found to be asleep.  Pt asked, "Is this for real?"  Pt was re-oriented, and reviewed with her the options of discharge from ED.  Pt states that she would like to be admitted because she feels like she will be back in the ED in a few hours if she was discharged to home.  Dr Florina Ou notified, orders received for PO fluid trial as pt is not actively vomiting at this time.

## 2011-04-24 NOTE — ED Provider Notes (Signed)
History     CSN: [id number] Arrival date & time: 04/24/2011  3:53 AM  Chief Complaint  Patient presents with  . Emesis    (Consider location/radiation/quality/duration/timing/severity/associated sxs/prior treatment) HPI This is a 72 her old black female with history of cyclic vomiting syndrome. She has had intractable nausea and vomiting for the past 3 days. She was seen in the ED 2 days ago for the same. She describes her symptoms as severe and not relieved by her home medications. The vomiting is accompanied by abdominal pain which she also states is moderate to severe; she does not describe it as crampy. Her episodes are associated with watery stools. This episode is associated with left lower rib pain likely due to vomiting. Because of her vomiting she has not been able to take her labetalol; EMS reports patient was hypertensive en route, and patient was noted to be hypertensive here on arrival.  Past Medical History  Diagnosis Date  . Hypertension   . Cyclical vomiting     Past Surgical History  Procedure Date  . Tonsillectomy   . Tubal ligation   . Cesarean section   . Cholecystectomy   . Cesarean section   . Tubal ligation     History reviewed. No pertinent family history.  History  Substance Use Topics  . Smoking status: Never Smoker   . Smokeless tobacco: Not on file  . Alcohol Use: No    OB History    Grav Para Term Preterm Abortions TAB SAB Ect Mult Living                  Review of Systems  All other systems reviewed and are negative.    Allergies  Other; Peanut-containing drug products; Percocet; Tomato; and Zofran  Home Medications   Current Outpatient Rx  Name Route Sig Dispense Refill  . FLINTSTONES COMPLETE 60 MG PO CHEW Oral Chew 1 tablet by mouth daily.      Marland Kitchen LABETALOL HCL 100 MG PO TABS Oral Take 100 mg by mouth 2 (two) times daily.      . ATIVAN PO Oral Take 1 tablet by mouth 2 (two) times daily.      Marland Kitchen POTASSIUM CHLORIDE CR 10 MEQ PO  TBCR Oral Take 1 tablet (10 mEq total) by mouth 2 (two) times daily. 6 tablet 0  . PROMETHAZINE HCL 25 MG RE SUPP Rectal Place 25 mg rectally every 6 (six) hours as needed. For nausea and vomiting    . PROMETHAZINE HCL 25 MG PO TABS Oral Take 25 mg by mouth every 6 (six) hours as needed. Nausea and vomiting       BP 169/115  Pulse 116  Temp(Src) 98.1 F (36.7 C) (Axillary)  Resp 24  Ht 5' 3"  (1.6 m)  Wt 140 lb (63.504 kg)  BMI 24.80 kg/m2  SpO2 99%  LMP 04/20/2011  Physical Exam (Examined after the patient was medicated with IV Phenergan and Dilaudid.) General: Well-developed, well-nourished female in no acute distress; appearance consistent with age of record HENT: normocephalic, atraumatic Eyes: No apparent Neck: supple Heart: regular rate and rhythm Lungs: clear to auscultation bilaterally Abdomen: soft; mild diffuse tenderness; nondistended; no masses or hepatosplenomegaly; bowel sounds present Extremities: No deformity; full range of motion; pulses normal Neurologic: Awake, alert and oriented;motor function intact in all extremities and symmetric; no facial droop Skin: Warm and dry Psychiatric: Anxious    ED Course  Procedures (including critical care time)    MDM   Nursing notes  and vitals signs, including pulse oximetry, reviewed.  Summary of previous visit's results, reviewed by myself:  Labs:  Results for orders placed during the hospital encounter of 04/20/11  URINALYSIS, ROUTINE W REFLEX MICROSCOPIC      Component Value Range   Color, Urine YELLOW  YELLOW    Appearance CLEAR  CLEAR    Specific Gravity, Urine 1.023  1.005 - 1.030    pH 6.5  5.0 - 8.0    Glucose, UA NEGATIVE  NEGATIVE (mg/dL)   Hgb urine dipstick LARGE (*) NEGATIVE    Bilirubin Urine NEGATIVE  NEGATIVE    Ketones, ur >80 (*) NEGATIVE (mg/dL)   Protein, ur 30 (*) NEGATIVE (mg/dL)   Urobilinogen, UA 0.2  0.0 - 1.0 (mg/dL)   Nitrite NEGATIVE  NEGATIVE    Leukocytes, UA NEGATIVE   NEGATIVE   PREGNANCY, URINE      Component Value Range   Preg Test, Ur NEGATIVE    CBC      Component Value Range   WBC 15.4 (*) 4.0 - 10.5 (K/uL)   RBC 4.69  3.87 - 5.11 (MIL/uL)   Hemoglobin 13.8  12.0 - 15.0 (g/dL)   HCT 39.4  36.0 - 46.0 (%)   MCV 84.0  78.0 - 100.0 (fL)   MCH 29.4  26.0 - 34.0 (pg)   MCHC 35.0  30.0 - 36.0 (g/dL)   RDW 12.9  11.5 - 15.5 (%)   Platelets 273  150 - 400 (K/uL)  DIFFERENTIAL      Component Value Range   Neutrophils Relative 94 (*) 43 - 77 (%)   Neutro Abs 14.4 (*) 1.7 - 7.7 (K/uL)   Lymphocytes Relative 4 (*) 12 - 46 (%)   Lymphs Abs 0.6 (*) 0.7 - 4.0 (K/uL)   Monocytes Relative 2 (*) 3 - 12 (%)   Monocytes Absolute 0.4  0.1 - 1.0 (K/uL)   Eosinophils Relative 0  0 - 5 (%)   Eosinophils Absolute 0.0  0.0 - 0.7 (K/uL)   Basophils Relative 0  0 - 1 (%)   Basophils Absolute 0.0  0.0 - 0.1 (K/uL)  BASIC METABOLIC PANEL      Component Value Range   Sodium 140  135 - 145 (mEq/L)   Potassium 3.4 (*) 3.5 - 5.1 (mEq/L)   Chloride 104  96 - 112 (mEq/L)   CO2 22  19 - 32 (mEq/L)   Glucose, Bld 116 (*) 70 - 99 (mg/dL)   BUN 6  6 - 23 (mg/dL)   Creatinine, Ser 0.60  0.50 - 1.10 (mg/dL)   Calcium 9.6  8.4 - 10.5 (mg/dL)   GFR calc non Af Amer >90  >90 (mL/min)   GFR calc Af Amer >90  >90 (mL/min)  URINE MICROSCOPIC-ADD ON      Component Value Range   Squamous Epithelial / LPF RARE  RARE    RBC / HPF 11-20  <3 (RBC/hpf)   Bacteria, UA RARE  RARE    Urine-Other MUCOUS PRESENT     6:38 AM Patient feels better would like to go home. He is drinking fluids without emesis. He requests a prescription for an analgesic pending follow up with her primary care physician the        Wynetta Fines, MD 04/24/11 [phone]

## 2011-04-24 NOTE — ED Notes (Signed)
Pt with "chronic vomiting syndrome" was transported by ems 2 days ago for the same thing, pt actively wretching at this time.  Hypertensive per ems at 170/120, tachycardic at 103bpm.

## 2011-05-18 ENCOUNTER — Emergency Department (HOSPITAL_BASED_OUTPATIENT_CLINIC_OR_DEPARTMENT_OTHER)
Admission: EM | Admit: 2011-05-18 | Discharge: 2011-05-18 | Disposition: A | Discharge status: Home / Self Care | Payer: PRIVATE HEALTH INSURANCE | Attending: Emergency Medicine | Admitting: Emergency Medicine

## 2011-05-18 ENCOUNTER — Encounter (HOSPITAL_BASED_OUTPATIENT_CLINIC_OR_DEPARTMENT_OTHER): Payer: Self-pay

## 2011-05-18 DIAGNOSIS — R112 Nausea with vomiting, unspecified: Secondary | ICD-10-CM | POA: Insufficient documentation

## 2011-05-18 DIAGNOSIS — R109 Unspecified abdominal pain: Secondary | ICD-10-CM | POA: Insufficient documentation

## 2011-05-18 DIAGNOSIS — IMO0001 Reserved for inherently not codable concepts without codable children: Secondary | ICD-10-CM | POA: Insufficient documentation

## 2011-05-18 DIAGNOSIS — I10 Essential (primary) hypertension: Secondary | ICD-10-CM | POA: Insufficient documentation

## 2011-05-18 DIAGNOSIS — I951 Orthostatic hypotension: Secondary | ICD-10-CM | POA: Insufficient documentation

## 2011-05-18 DIAGNOSIS — Z79899 Other long term (current) drug therapy: Secondary | ICD-10-CM | POA: Insufficient documentation

## 2011-05-18 LAB — CBC
HCT: 38.6 % (ref 36.0–46.0)
Hemoglobin: 13.2 g/dL (ref 12.0–15.0)
MCH: 29.2 pg (ref 26.0–34.0)
MCHC: 34.2 g/dL (ref 30.0–36.0)

## 2011-05-18 LAB — URINALYSIS, ROUTINE W REFLEX MICROSCOPIC
Bilirubin Urine: NEGATIVE
Ketones, ur: 15 mg/dL — AB
Nitrite: NEGATIVE
Urobilinogen, UA: 0.2 mg/dL (ref 0.0–1.0)

## 2011-05-18 LAB — DIFFERENTIAL
Basophils Relative: 0 % (ref 0–1)
Eosinophils Absolute: 0 10*3/uL (ref 0.0–0.7)
Monocytes Absolute: 0.7 10*3/uL (ref 0.1–1.0)
Monocytes Relative: 4 % (ref 3–12)

## 2011-05-18 LAB — PREGNANCY, URINE: Preg Test, Ur: NEGATIVE

## 2011-05-18 LAB — BASIC METABOLIC PANEL
BUN: 6 mg/dL (ref 6–23)
Creatinine, Ser: 0.5 mg/dL (ref 0.50–1.10)
GFR calc Af Amer: >90 mL/min (ref >90)
GFR calc non Af Amer: >90 mL/min (ref >90)

## 2011-05-18 LAB — URINE MICROSCOPIC-ADD ON

## 2011-05-18 MED ORDER — DROPERIDOL 2.5 MG/ML IJ SOLN
1.2500 mg | Freq: Once | INTRAMUSCULAR | Status: AC
  Administered 2011-05-18: 1.25 mg via INTRAVENOUS
  Filled 2011-05-18: qty 2

## 2011-05-18 MED ORDER — PROMETHAZINE HCL 25 MG/ML IJ SOLN
25.0000 mg | Freq: Once | INTRAMUSCULAR | Status: AC
  Administered 2011-05-18: 25 mg via INTRAVENOUS
  Filled 2011-05-18: qty 1

## 2011-05-18 MED ORDER — HYDROMORPHONE HCL PF 1 MG/ML IJ SOLN
1.0000 mg | Freq: Once | INTRAMUSCULAR | Status: AC
  Administered 2011-05-18: 1 mg via INTRAVENOUS
  Filled 2011-05-18: qty 1

## 2011-05-18 MED ORDER — SODIUM CHLORIDE 0.9 % IV BOLUS (SEPSIS)
1000.0000 mL | Freq: Once | INTRAVENOUS | Status: AC
  Administered 2011-05-18: 1000 mL via INTRAVENOUS

## 2011-05-18 MED ORDER — PROMETHAZINE HCL 25 MG PO TABS: 25.0000 mg | ORAL_TABLET | Freq: Four times a day (QID) | ORAL | Status: DC | PRN

## 2011-05-18 MED ORDER — PROMETHAZINE HCL 25 MG RE SUPP: 25.0000 mg | Freq: Four times a day (QID) | RECTAL | Status: DC | PRN

## 2011-05-18 MED ORDER — SODIUM CHLORIDE 0.9 % IV SOLN: INTRAVENOUS | Status: DC

## 2011-05-18 MED ORDER — DIPHENHYDRAMINE HCL 50 MG/ML IJ SOLN
25.0000 mg | Freq: Once | INTRAMUSCULAR | Status: AC
  Administered 2011-05-18: 25 mg via INTRAVENOUS
  Filled 2011-05-18: qty 1

## 2011-05-18 NOTE — ED Notes (Signed)
Pt reports chronic nausea/vomiting.  She reports abdominal pain and vomiting worsened Sunday.  She by PMD Tuesday, received IM Phenergan and IV Dilaudid.

## 2011-05-18 NOTE — ED Notes (Signed)
Pt. Has ride home

## 2011-05-18 NOTE — ED Provider Notes (Signed)
History     CSN: [id number] Arrival date & time: 05/18/2011 10:55 AM   First MD Initiated Contact with Patient 05/18/11 1102      Chief Complaint  Patient presents with  . Emesis  . Diarrhea  . Tailbone Pain  . Abdominal Pain    (Consider location/radiation/quality/duration/timing/severity/associated sxs/prior treatment) HPI Comments: Patient reports history of cyclic vomiting syndrome presenting with typical nausea, vomiting, abdominal pain diarrhea. Symptoms began 3 days ago after receiving a flu shot 5 days ago. She's been taking Phenergan suppositories without relief. Denies any fevers, urinary symptoms, vaginal symptoms. She states that she feels dehydrated and has been unable to keep down any of her medications. She is also complaining of lumbar back pain without history of injury. She denies any weakness, numbness, tingling, bowel or bladder incontinence. Her abdominal pain is diffuse and typical of her vomiting exacerbations.  The history is provided by the patient.    Past Medical History  Diagnosis Date  . Hypertension   . Cyclical vomiting   . CVS disease     Past Surgical History  Procedure Date  . Tonsillectomy   . Tubal ligation   . Cesarean section   . Cholecystectomy   . Cesarean section   . Tubal ligation     No family history on file.  History  Substance Use Topics  . Smoking status: Never Smoker   . Smokeless tobacco: Never Used  . Alcohol Use: No    OB History    Grav Para Term Preterm Abortions TAB SAB Ect Mult Living                  Review of Systems  Constitutional: Positive for appetite change and fatigue. Negative for fever.  HENT: Negative for congestion and rhinorrhea.   Respiratory: Negative for chest tightness and shortness of breath.   Cardiovascular: Negative for chest pain.  Gastrointestinal: Positive for nausea, vomiting, abdominal pain and diarrhea.  Genitourinary: Negative for dysuria and hematuria.  Musculoskeletal:  Positive for back pain.  Skin: Negative for rash.  Neurological: Negative for weakness and headaches.    Allergies  Other; Peanut-containing drug products; Percocet; Tomato; and Zofran  Home Medications   Current Outpatient Rx  Name Route Sig Dispense Refill  . FLINTSTONES COMPLETE 60 MG PO CHEW Oral Chew 1 tablet by mouth daily.      Marland Kitchen LABETALOL HCL 100 MG PO TABS Oral Take 100 mg by mouth 2 (two) times daily.      . ATIVAN PO Oral Take 1 tablet by mouth 2 (two) times daily.      Marland Kitchen POTASSIUM CHLORIDE CR 10 MEQ PO TBCR Oral Take 1 tablet (10 mEq total) by mouth 2 (two) times daily. 6 tablet 0  . PROMETHAZINE HCL 25 MG RE SUPP Rectal Place 25 mg rectally every 6 (six) hours as needed. For nausea and vomiting    . PROMETHAZINE HCL 25 MG RE SUPP Rectal Place 1 suppository (25 mg total) rectally every 6 (six) hours as needed for nausea. 12 each 0  . PROMETHAZINE HCL 25 MG PO TABS Oral Take 25 mg by mouth every 6 (six) hours as needed. Nausea and vomiting     . PROMETHAZINE HCL 25 MG PO TABS Oral Take 1 tablet (25 mg total) by mouth every 6 (six) hours as needed for nausea. 30 tablet 0    BP 167/119  Pulse 110  Temp(Src) 98.5 F (36.9 C) (Oral)  Resp 18  Ht 5' 3"  (1.6  m)  Wt 148 lb (67.132 kg)  BMI 26.22 kg/m2  SpO2 100%  LMP 05/18/2011  Physical Exam  Constitutional: She is oriented to person, place, and time. She appears well-developed and well-nourished. No distress.  HENT:  Head: Normocephalic and atraumatic.  Mouth/Throat: Oropharynx is clear and moist. No oropharyngeal exudate.  Eyes: Conjunctivae are normal. Pupils are equal, round, and reactive to light.  Neck: Normal range of motion.  Cardiovascular: Normal rate, regular rhythm and normal heart sounds.   Pulmonary/Chest: Effort normal and breath sounds normal. No respiratory distress.  Abdominal: Soft. There is tenderness. There is no rebound and no guarding.       Mild diffuse tenderness without guarding or rebound    Musculoskeletal: Normal range of motion. She exhibits tenderness.       Lumbar paraspinal tenderness.  5/5 strength bilateral LE. No midline pain    Neurological: She is alert and oriented to person, place, and time. No cranial nerve deficit.  Skin: Skin is warm.    ED Course  Procedures (including critical care time)  Labs Reviewed  CBC - Abnormal; Notable for the following:    WBC 16.3 (*)    All other components within normal limits  DIFFERENTIAL - Abnormal; Notable for the following:    Neutrophils Relative 90 (*)    Neutro Abs 14.7 (*)    Lymphocytes Relative 5 (*)    All other components within normal limits  BASIC METABOLIC PANEL - Abnormal; Notable for the following:    Glucose, Bld 118 (*)    All other components within normal limits  URINALYSIS, ROUTINE W REFLEX MICROSCOPIC - Abnormal; Notable for the following:    Hgb urine dipstick MODERATE (*)    Ketones, ur 15 (*)    All other components within normal limits  PREGNANCY, URINE  URINE MICROSCOPIC-ADD ON   No results found.   1. Nausea and vomiting   2. Orthostatic hypotension       MDM  Nausea, vomiting abdominal pain diarrhea typical of cyclic vomiting syndrome exacerbations. Abdomen is soft and nonsurgical on exam.  Patient states she is feeling better and requesting discharge. She's had no further vomiting in the ED. Her orthostatic vitals were positive but she has received 2 L of fluid in the ED. Her electrolytes are within normal limits. Her leukocytosis is noted and is similar to previous. Urinalysis does not show any evidence of infection. She is tolerating by mouth in ED.     Ezequiel Essex, MD 05/18/11 1538

## 2011-06-26 ENCOUNTER — Other Ambulatory Visit: Payer: Self-pay

## 2011-06-26 ENCOUNTER — Emergency Department (HOSPITAL_BASED_OUTPATIENT_CLINIC_OR_DEPARTMENT_OTHER)
Admission: EM | Admit: 2011-06-26 | Discharge: 2011-06-26 | Disposition: A | Discharge status: Home / Self Care | Payer: PRIVATE HEALTH INSURANCE | Attending: Emergency Medicine | Admitting: Emergency Medicine

## 2011-06-26 ENCOUNTER — Encounter (HOSPITAL_BASED_OUTPATIENT_CLINIC_OR_DEPARTMENT_OTHER): Payer: Self-pay | Admitting: *Deleted

## 2011-06-26 DIAGNOSIS — I10 Essential (primary) hypertension: Secondary | ICD-10-CM | POA: Insufficient documentation

## 2011-06-26 DIAGNOSIS — R1110 Vomiting, unspecified: Secondary | ICD-10-CM | POA: Insufficient documentation

## 2011-06-26 LAB — BASIC METABOLIC PANEL WITH GFR
BUN: 11 mg/dL (ref 6–23)
CO2: 26 meq/L (ref 19–32)
Calcium: 9.9 mg/dL (ref 8.4–10.5)
Chloride: 102 meq/L (ref 96–112)
Creatinine, Ser: 0.8 mg/dL (ref 0.50–1.10)
GFR calc Af Amer: >90 mL/min
GFR calc non Af Amer: >90 mL/min
Glucose, Bld: 93 mg/dL (ref 70–99)
Potassium: 3.5 meq/L (ref 3.5–5.1)
Sodium: 138 meq/L (ref 135–145)

## 2011-06-26 LAB — URINALYSIS, ROUTINE W REFLEX MICROSCOPIC
Bilirubin Urine: NEGATIVE
Glucose, UA: NEGATIVE mg/dL
Hgb urine dipstick: NEGATIVE
Ketones, ur: NEGATIVE mg/dL
Leukocytes, UA: NEGATIVE
Nitrite: NEGATIVE
Protein, ur: NEGATIVE mg/dL
Specific Gravity, Urine: 1.015 (ref 1.005–1.030)
Urobilinogen, UA: 0.2 mg/dL (ref 0.0–1.0)
pH: 7.5 (ref 5.0–8.0)

## 2011-06-26 LAB — PREGNANCY, URINE: Preg Test, Ur: NEGATIVE

## 2011-06-26 MED ORDER — DROPERIDOL 2.5 MG/ML IJ SOLN
1.2500 mg | Freq: Once | INTRAMUSCULAR | Status: AC
  Administered 2011-06-26: 1.25 mg via INTRAVENOUS
  Filled 2011-06-26: qty 2

## 2011-06-26 MED ORDER — PROMETHAZINE HCL 25 MG/ML IJ SOLN
25.0000 mg | Freq: Once | INTRAMUSCULAR | Status: AC
  Administered 2011-06-26: 25 mg via INTRAVENOUS
  Filled 2011-06-26: qty 1

## 2011-06-26 MED ORDER — SODIUM CHLORIDE 0.9 % IV BOLUS (SEPSIS)
1000.0000 mL | Freq: Once | INTRAVENOUS | Status: AC
  Administered 2011-06-26: 1000 mL via INTRAVENOUS

## 2011-06-26 NOTE — ED Notes (Signed)
PT no longer in room upon attempt to dc.

## 2011-06-26 NOTE — ED Provider Notes (Addendum)
History     CSN: [id number] Arrival date & time: 06/26/2011  7:33 PM   First MD Initiated Contact with Patient 06/26/11 2001      Chief Complaint  Patient presents with  . Emesis    (Consider location/radiation/quality/duration/timing/severity/associated sxs/prior treatment) HPI History provided by pt.   Pt has h/o cyclic vomiting syndrome.  Exacerbations occur approx bimonthly.  She has vomited several times a day for the last 3 days and had diarrhea as well.  Has phenergan suppositories but insertion painful because of hemorrhoids.  Denies fever, abdominal pain and urinary sx.   Sx typical of CVS.  Per prior chart, pt has been seen for same multiple times in the past.    Past Medical History  Diagnosis Date  . Hypertension   . Cyclical vomiting   . CVS disease     Past Surgical History  Procedure Date  . Tonsillectomy   . Tubal ligation   . Cesarean section   . Cholecystectomy   . Cesarean section   . Tubal ligation     History reviewed. No pertinent family history.  History  Substance Use Topics  . Smoking status: Never Smoker   . Smokeless tobacco: Never Used  . Alcohol Use: No    OB History    Grav Para Term Preterm Abortions TAB SAB Ect Mult Living                  Review of Systems  All other systems reviewed and are negative.    Allergies  Other; Peanut-containing drug products; Percocet; Tomato; and Zofran  Home Medications   Current Outpatient Rx  Name Route Sig Dispense Refill  . FLINTSTONES COMPLETE 60 MG PO CHEW Oral Chew 1 tablet by mouth daily.      Marland Kitchen LABETALOL HCL 100 MG PO TABS Oral Take 100 mg by mouth 2 (two) times daily.      . ATIVAN PO Oral Take 1 tablet by mouth 2 (two) times daily.      Marland Kitchen POTASSIUM CHLORIDE CR 10 MEQ PO TBCR Oral Take 1 tablet (10 mEq total) by mouth 2 (two) times daily. 6 tablet 0  . PROMETHAZINE HCL 25 MG RE SUPP Rectal Place 25 mg rectally every 6 (six) hours as needed. For nausea and vomiting    .  PROMETHAZINE HCL 25 MG PO TABS Oral Take 25 mg by mouth every 6 (six) hours as needed. Nausea and vomiting       BP 147/96  Pulse 84  Temp 99.3 F (37.4 C)  Resp 18  Ht 5' 2"  (1.575 m)  Wt 146 lb (66.225 kg)  BMI 26.70 kg/m2  SpO2 100%  LMP 06/12/2011  Physical Exam  Nursing note and vitals reviewed. Constitutional: She is oriented to person, place, and time. She appears well-developed and well-nourished. No distress.  HENT:  Head: Normocephalic and atraumatic.  Eyes:       Normal appearance  Neck: Normal range of motion.  Cardiovascular: Normal rate and regular rhythm.   Pulmonary/Chest: Effort normal and breath sounds normal.  Abdominal: Soft. Bowel sounds are normal. She exhibits no distension and no mass. There is no tenderness. There is no rebound and no guarding.       Mild RLQ ttp.  Pt reports that she always experiences pain in this location during CVS exacerbations.  No CVA tenderness  Neurological: She is alert and oriented to person, place, and time.  Skin: Skin is warm and dry. No rash noted.  Psychiatric: She has a normal mood and affect. Her behavior is normal.    ED Course  Procedures (including critical care time)   Date: 06/27/2011  Rate: 81  Rhythm: normal sinus rhythm and sinus arrhythma  QRS Axis: normal  Intervals: normal  ST/T Wave abnormalities: normal  Conduction Disutrbances:none  Narrative Interpretation:   Old EKG Reviewed: unchanged    Labs Reviewed  PREGNANCY, URINE  URINALYSIS, ROUTINE W REFLEX MICROSCOPIC  BASIC METABOLIC PANEL   No results found.   1. Vomiting       MDM  Pt presents w/ vomiting and diarrhea; sx typical of her cyclical vomiting syndrome.  No fever or abdominal pain.  Is seen for same frequently in ED.  Abd benign but mild RLQ ttp on exam (pt reports this is a nml finding); no signs of dehydration.  No electrolyte abnormalities or UTI.  Pt received IV fluids and phenergan w/out relief and then droperidol which  she requested.  Nausea is controlled and pt tolerating pos.   VSS.  Pt d/c'd home.  She has a GI physician to f/u with.        Remer Macho, PA 06/26/11 [place], [place] 06/27/11 [phone]

## 2011-06-26 NOTE — ED Notes (Signed)
Pt c/o vomiting x 3 days > CVS syndrome

## 2011-06-26 NOTE — ED Notes (Signed)
PA requested pt tolerate fluid and food trial Prior to dc.  Pt consumed 4 packs of saltines and a ginger ale.

## 2011-06-27 NOTE — ED Provider Notes (Signed)
Medical screening examination/treatment/procedure(s) were performed by non-physician practitioner and as supervising physician I was immediately available for consultation/collaboration.  Carmin Muskrat, MD 06/27/11 [phone]

## 2011-06-28 NOTE — ED Provider Notes (Signed)
Medical screening examination/treatment/procedure(s) were performed by non-physician practitioner and as supervising physician I was immediately available for consultation/collaboration.   Carmin Muskrat, MD 06/28/11 [phone]

## 2011-08-08 ENCOUNTER — Encounter (HOSPITAL_BASED_OUTPATIENT_CLINIC_OR_DEPARTMENT_OTHER): Payer: Self-pay | Admitting: *Deleted

## 2011-08-08 ENCOUNTER — Emergency Department (HOSPITAL_BASED_OUTPATIENT_CLINIC_OR_DEPARTMENT_OTHER)
Admission: EM | Admit: 2011-08-08 | Discharge: 2011-08-08 | Disposition: A | Discharge status: Home / Self Care | Payer: Self-pay | Attending: Emergency Medicine | Admitting: Emergency Medicine

## 2011-08-08 DIAGNOSIS — A499 Bacterial infection, unspecified: Secondary | ICD-10-CM | POA: Insufficient documentation

## 2011-08-08 DIAGNOSIS — R197 Diarrhea, unspecified: Secondary | ICD-10-CM | POA: Insufficient documentation

## 2011-08-08 DIAGNOSIS — N760 Acute vaginitis: Secondary | ICD-10-CM | POA: Insufficient documentation

## 2011-08-08 DIAGNOSIS — R109 Unspecified abdominal pain: Secondary | ICD-10-CM | POA: Insufficient documentation

## 2011-08-08 DIAGNOSIS — B9689 Other specified bacterial agents as the cause of diseases classified elsewhere: Secondary | ICD-10-CM | POA: Insufficient documentation

## 2011-08-08 LAB — WET PREP, GENITAL
Trich, Wet Prep: NONE SEEN
Yeast Wet Prep HPF POC: NONE SEEN

## 2011-08-08 LAB — BASIC METABOLIC PANEL
BUN: 11 mg/dL (ref 6–23)
CO2: 24 mEq/L (ref 19–32)
Calcium: 9.8 mg/dL (ref 8.4–10.5)
Chloride: 101 mEq/L (ref 96–112)
Creatinine, Ser: 0.8 mg/dL (ref 0.50–1.10)
GFR calc Af Amer: >90 mL/min (ref >90)
GFR calc non Af Amer: >90 mL/min (ref >90)
Glucose, Bld: 116 mg/dL — ABNORMAL HIGH (ref 70–99)
Potassium: 3.9 mEq/L (ref 3.5–5.1)
Sodium: 138 mEq/L (ref 135–145)

## 2011-08-08 LAB — URINALYSIS, ROUTINE W REFLEX MICROSCOPIC
Bilirubin Urine: NEGATIVE
Glucose, UA: NEGATIVE mg/dL
Hgb urine dipstick: NEGATIVE
Ketones, ur: 15 mg/dL — AB
Leukocytes, UA: NEGATIVE
Nitrite: NEGATIVE
Protein, ur: NEGATIVE mg/dL
Specific Gravity, Urine: 1.029 (ref 1.005–1.030)
Urobilinogen, UA: 1 mg/dL (ref 0.0–1.0)
pH: 6.5 (ref 5.0–8.0)

## 2011-08-08 LAB — CBC
MCV: 89.4 fL (ref 78.0–100.0)
Platelets: 263 10*3/uL (ref 150–400)
RBC: 4.45 MIL/uL (ref 3.87–5.11)
RDW: 13.3 % (ref 11.5–15.5)
WBC: 8.3 10*3/uL (ref 4.0–10.5)

## 2011-08-08 LAB — DIFFERENTIAL
Basophils Absolute: 0 10*3/uL (ref 0.0–0.1)
Basophils Relative: 0 % (ref 0–1)
Eosinophils Absolute: 0.1 10*3/uL (ref 0.0–0.7)
Eosinophils Relative: 1 % (ref 0–5)
Lymphocytes Relative: 15 % (ref 12–46)
Lymphs Abs: 1.3 10*3/uL (ref 0.7–4.0)
Monocytes Absolute: 0.7 10*3/uL (ref 0.1–1.0)
Monocytes Relative: 9 % (ref 3–12)
Neutro Abs: 6.2 10*3/uL (ref 1.7–7.7)
Neutrophils Relative %: 75 % (ref 43–77)

## 2011-08-08 MED ORDER — DROPERIDOL 2.5 MG/ML IJ SOLN
2.5000 mg | Freq: Once | INTRAMUSCULAR | Status: AC
  Administered 2011-08-08: 2.5 mg via INTRAVENOUS
  Filled 2011-08-08: qty 2

## 2011-08-08 MED ORDER — METRONIDAZOLE 0.75 % VA GEL: 1.0000 | Freq: Two times a day (BID) | VAGINAL | Status: AC

## 2011-08-08 MED ORDER — SODIUM CHLORIDE 0.9 % IV BOLUS (SEPSIS)
1000.0000 mL | Freq: Once | INTRAVENOUS | Status: AC
  Administered 2011-08-08: 1000 mL via INTRAVENOUS

## 2011-08-08 NOTE — ED Notes (Signed)
Saline lock placed, labs taken to lab per protocol.

## 2011-08-08 NOTE — ED Provider Notes (Signed)
Medical screening examination/treatment/procedure(s) were performed by non-physician practitioner and as supervising physician I was immediately available for consultation/collaboration.    Dot Lanes, MD 08/08/11 [phone]

## 2011-08-08 NOTE — ED Notes (Signed)
Pt amb to room 11 with quick steady gait in nad. Pt reports not feeling well x Saturday, family member recently had influenza dx, pt states she has had subjective temps, cough, diarrhea and vomiting.

## 2011-08-08 NOTE — ED Provider Notes (Signed)
History     CSN: [id number]  Arrival date & time 08/08/11  1216   First MD Initiated Contact with Patient 08/08/11 1229      Chief Complaint  Patient presents with  . Cough  . Diarrhea  . Emesis    (Consider location/radiation/quality/duration/timing/severity/associated sxs/prior treatment) Patient is a 26 y.o. female presenting with diarrhea, vomiting, and abdominal pain. The history is provided by the patient.  Diarrhea The primary symptoms include abdominal pain, vomiting and diarrhea. Primary symptoms do not include fever.  The illness does not include chills.  Emesis  This is a recurrent problem. The current episode started 12 to 24 hours ago. The problem occurs 2 to 4 times per day. The problem has not changed since onset.The emesis has an appearance of stomach contents. Associated symptoms include abdominal pain and diarrhea. Pertinent negatives include no chills and no fever.  Abdominal Pain The primary symptoms of the illness include abdominal pain, vomiting and diarrhea. The primary symptoms of the illness do not include fever. The onset of the illness was gradual.  The abdominal pain began 13 to24 hours ago. The pain came on gradually. The abdominal pain is located in the periumbilical region.  The patient states that she believes she is currently not pregnant. The patient has had a change in bowel habit. Symptoms associated with the illness do not include chills. Associated symptoms comments: She has recurrent "Cyclic Vomiting Syndrome" while on her menses. This episode she reports irregular menses although on time. She states she is spotting and discharge is brown, unlike usual period. She has lower central abdominal pain associated with symptoms. .    Past Medical History  Diagnosis Date  . Hypertension   . Cyclical vomiting   . CVS disease     Past Surgical History  Procedure Date  . Tonsillectomy   . Tubal ligation   . Cesarean section   . Cholecystectomy   .  Cesarean section   . Tubal ligation     No family history on file.  History  Substance Use Topics  . Smoking status: Never Smoker   . Smokeless tobacco: Never Used  . Alcohol Use: No    OB History    Grav Para Term Preterm Abortions TAB SAB Ect Mult Living                  Review of Systems  Constitutional: Negative for fever and chills.  HENT: Negative.   Respiratory: Negative.   Cardiovascular: Negative.   Gastrointestinal: Positive for vomiting, abdominal pain and diarrhea.  Genitourinary: Positive for menstrual problem.  Musculoskeletal: Negative.   Skin: Negative.   Neurological: Negative.     Allergies  Other; Peanut-containing drug products; Percocet; Tomato; and Zofran  Home Medications   Current Outpatient Rx  Name Route Sig Dispense Refill  . FLINTSTONES COMPLETE 60 MG PO CHEW Oral Chew 1 tablet by mouth daily.      Marland Kitchen LABETALOL HCL 100 MG PO TABS Oral Take 100 mg by mouth 2 (two) times daily.      . ATIVAN PO Oral Take 1 tablet by mouth 2 (two) times daily.      Marland Kitchen POTASSIUM CHLORIDE ER 10 MEQ PO TBCR Oral Take 1 tablet (10 mEq total) by mouth 2 (two) times daily. 6 tablet 0  . PROMETHAZINE HCL 25 MG RE SUPP Rectal Place 25 mg rectally every 6 (six) hours as needed. For nausea and vomiting    . PROMETHAZINE HCL 25 MG PO  TABS Oral Take 25 mg by mouth every 6 (six) hours as needed. Nausea and vomiting       BP 124/89  Pulse 94  Temp(Src) 98.5 F (36.9 C) (Oral)  Resp 18  Ht 5' 3"  (1.6 m)  Wt 146 lb (66.225 kg)  BMI 25.86 kg/m2  SpO2 100%  LMP 08/06/2011  Physical Exam  Constitutional: She appears well-developed and well-nourished.  HENT:  Head: Normocephalic.  Neck: Normal range of motion. Neck supple.  Cardiovascular: Normal rate and regular rhythm.   Pulmonary/Chest: Effort normal and breath sounds normal.  Abdominal: Soft. Bowel sounds are normal. There is no tenderness. There is no rebound and no guarding.  Genitourinary: Vagina normal and  uterus normal. No vaginal discharge found.       No adnexal mass or tenderness.  Musculoskeletal: Normal range of motion.  Neurological: She is alert. No cranial nerve deficit.  Skin: Skin is warm and dry. No rash noted.  Psychiatric: She has a normal mood and affect.    ED Course  Procedures (including critical care time)  Labs Reviewed  BASIC METABOLIC PANEL - Abnormal; Notable for the following:    Glucose, Bld 116 (*)    All other components within normal limits  WET PREP, GENITAL - Abnormal; Notable for the following:    Clue Cells, Wet Prep MODERATE (*)    WBC, Wet Prep HPF POC FEW (*)    All other components within normal limits  URINALYSIS, ROUTINE W REFLEX MICROSCOPIC - Abnormal; Notable for the following:    APPearance CLOUDY (*)    Ketones, ur 15 (*)    All other components within normal limits  CBC  DIFFERENTIAL  PREGNANCY, URINE  GC/CHLAMYDIA PROBE AMP, GENITAL   No results found.   No diagnosis found.    MDM          Leotis Shames, PA-C 08/08/11 1503

## 2011-08-09 LAB — GC/CHLAMYDIA PROBE AMP, GENITAL
Chlamydia, DNA Probe: NEGATIVE
GC Probe Amp, Genital: NEGATIVE

## 2011-10-07 ENCOUNTER — Emergency Department (HOSPITAL_BASED_OUTPATIENT_CLINIC_OR_DEPARTMENT_OTHER)
Admission: EM | Admit: 2011-10-07 | Discharge: 2011-10-07 | Disposition: A | Discharge status: Home / Self Care | Payer: Self-pay | Attending: Emergency Medicine | Admitting: Emergency Medicine

## 2011-10-07 ENCOUNTER — Encounter (HOSPITAL_BASED_OUTPATIENT_CLINIC_OR_DEPARTMENT_OTHER): Payer: Self-pay | Admitting: Emergency Medicine

## 2011-10-07 DIAGNOSIS — R109 Unspecified abdominal pain: Secondary | ICD-10-CM | POA: Insufficient documentation

## 2011-10-07 DIAGNOSIS — I10 Essential (primary) hypertension: Secondary | ICD-10-CM | POA: Insufficient documentation

## 2011-10-07 DIAGNOSIS — N390 Urinary tract infection, site not specified: Secondary | ICD-10-CM | POA: Insufficient documentation

## 2011-10-07 DIAGNOSIS — R112 Nausea with vomiting, unspecified: Secondary | ICD-10-CM | POA: Insufficient documentation

## 2011-10-07 LAB — URINALYSIS, ROUTINE W REFLEX MICROSCOPIC
Glucose, UA: NEGATIVE mg/dL
Ketones, ur: 40 mg/dL — AB
Leukocytes, UA: NEGATIVE
Protein, ur: 30 mg/dL — AB
Urobilinogen, UA: 0.2 mg/dL (ref 0.0–1.0)

## 2011-10-07 LAB — URINE MICROSCOPIC-ADD ON

## 2011-10-07 MED ORDER — METOCLOPRAMIDE HCL 5 MG/ML IJ SOLN
10.0000 mg | Freq: Once | INTRAMUSCULAR | Status: AC
  Administered 2011-10-07: 10 mg via INTRAVENOUS
  Filled 2011-10-07: qty 2

## 2011-10-07 MED ORDER — SODIUM CHLORIDE 0.9 % IV BOLUS (SEPSIS)
1000.0000 mL | Freq: Once | INTRAVENOUS | Status: AC
  Administered 2011-10-07: 1000 mL via INTRAVENOUS

## 2011-10-07 MED ORDER — HYDROMORPHONE HCL PF 1 MG/ML IJ SOLN
1.0000 mg | Freq: Once | INTRAMUSCULAR | Status: AC
  Administered 2011-10-07: 1 mg via INTRAVENOUS
  Filled 2011-10-07: qty 1

## 2011-10-07 MED ORDER — DEXTROSE 5 % IV SOLN
1.0000 g | Freq: Once | INTRAVENOUS | Status: AC
  Administered 2011-10-07: 1 g via INTRAVENOUS
  Filled 2011-10-07: qty 10

## 2011-10-07 MED ORDER — SULFAMETHOXAZOLE-TRIMETHOPRIM 800-160 MG PO TABS: 1.0000 | ORAL_TABLET | Freq: Two times a day (BID) | ORAL | Status: AC

## 2011-10-07 MED ORDER — PROMETHAZINE HCL 25 MG RE SUPP: 25.0000 mg | Freq: Four times a day (QID) | RECTAL | Status: DC | PRN

## 2011-10-07 MED ORDER — PROMETHAZINE HCL 25 MG/ML IJ SOLN
25.0000 mg | Freq: Once | INTRAMUSCULAR | Status: AC
  Administered 2011-10-07: 25 mg via INTRAVENOUS
  Filled 2011-10-07: qty 1

## 2011-10-07 MED ORDER — PROMETHAZINE HCL 25 MG PO TABS: 25.0000 mg | ORAL_TABLET | Freq: Four times a day (QID) | ORAL | Status: DC | PRN

## 2011-10-07 MED ORDER — DIPHENHYDRAMINE HCL 50 MG/ML IJ SOLN
25.0000 mg | Freq: Once | INTRAMUSCULAR | Status: AC
  Administered 2011-10-07: 25 mg via INTRAVENOUS
  Filled 2011-10-07: qty 1

## 2011-10-07 NOTE — Discharge Instructions (Signed)
Urinary Tract Infection Infections of the urinary tract can start in several places. A bladder infection (cystitis), a kidney infection (pyelonephritis), and a prostate infection (prostatitis) are different types of urinary tract infections (UTIs). They usually get better if treated with medicines (antibiotics) that kill germs. Take all the medicine until it is gone. You or your child may feel better in a few days, but TAKE ALL MEDICINE or the infection may not respond and may become more difficult to treat. HOME CARE INSTRUCTIONS   Drink enough water and fluids to keep the urine clear or pale yellow. Cranberry juice is especially recommended, in addition to large amounts of water.   Avoid caffeine, tea, and carbonated beverages. They tend to irritate the bladder.   Alcohol may irritate the prostate.   Only take over-the-counter or prescription medicines for pain, discomfort, or fever as directed by your caregiver.  To prevent further infections:  Empty the bladder often. Avoid holding urine for long periods of time.   After a bowel movement, women should cleanse from front to back. Use each tissue only once.   Empty the bladder before and after sexual intercourse.  FINDING OUT THE RESULTS OF YOUR TEST Not all test results are available during your visit. If your or your child's test results are not back during the visit, make an appointment with your caregiver to find out the results. Do not assume everything is normal if you have not heard from your caregiver or the medical facility. It is important for you to follow up on all test results. SEEK MEDICAL CARE IF:   There is back pain.   Your baby is older than 3 months with a rectal temperature of 100.5 F (38.1 C) or higher for more than 1 day.   Your or your child's problems (symptoms) are no better in 3 days. Return sooner if you or your child is getting worse.  SEEK IMMEDIATE MEDICAL CARE IF:   There is severe back pain or lower  abdominal pain.   You or your child develops chills.   You have a fever.   Your baby is older than 3 months with a rectal temperature of 102 F (38.9 C) or higher.   Your baby is 79 months old or younger with a rectal temperature of 100.4 F (38 C) or higher.   There is nausea or vomiting.   There is continued burning or discomfort with urination.  MAKE SURE YOU:   Understand these instructions.   Will watch your condition.   Will get help right away if you are not doing well or get worse.  Document Released: 04/05/2005 Document Revised: 06/15/2011 Document Reviewed: 11/08/2006 [facility] Patient Information 2012 [place].Nausea and Vomiting Nausea is a sick feeling that often comes before throwing up (vomiting). Vomiting is a reflex where stomach contents come out of your mouth. Vomiting can cause severe loss of body fluids (dehydration). Children and elderly adults can become dehydrated quickly, especially if they also have diarrhea. Nausea and vomiting are symptoms of a condition or disease. It is important to find the cause of your symptoms. CAUSES   Direct irritation of the stomach lining. This irritation can result from increased acid production (gastroesophageal reflux disease), infection, food poisoning, taking certain medicines (such as nonsteroidal anti-inflammatory drugs), alcohol use, or tobacco use.   Signals from the brain.These signals could be caused by a headache, heat exposure, an inner ear disturbance, increased pressure in the brain from injury, infection, a tumor, or a  concussion, pain, emotional stimulus, or metabolic problems.   An obstruction in the gastrointestinal tract (bowel obstruction).   Illnesses such as diabetes, hepatitis, gallbladder problems, appendicitis, kidney problems, cancer, sepsis, atypical symptoms of a heart attack, or eating disorders.   Medical treatments such as chemotherapy and radiation.   Receiving medicine that makes you  sleep (general anesthetic) during surgery.  DIAGNOSIS Your caregiver may ask for tests to be done if the problems do not improve after a few days. Tests may also be done if symptoms are severe or if the reason for the nausea and vomiting is not clear. Tests may include:  Urine tests.   Blood tests.   Stool tests.   Cultures (to look for evidence of infection).   X-rays or other imaging studies.  Test results can help your caregiver make decisions about treatment or the need for additional tests. TREATMENT You need to stay well hydrated. Drink frequently but in small amounts.You may wish to drink water, sports drinks, clear broth, or eat frozen ice pops or gelatin dessert to help stay hydrated.When you eat, eating slowly may help prevent nausea.There are also some antinausea medicines that may help prevent nausea. HOME CARE INSTRUCTIONS   Take all medicine as directed by your caregiver.   If you do not have an appetite, do not force yourself to eat. However, you must continue to drink fluids.   If you have an appetite, eat a normal diet unless your caregiver tells you differently.   Eat a variety of complex carbohydrates (rice, wheat, potatoes, bread), lean meats, yogurt, fruits, and vegetables.   Avoid high-fat foods because they are more difficult to digest.   Drink enough water and fluids to keep your urine clear or pale yellow.   If you are dehydrated, ask your caregiver for specific rehydration instructions. Signs of dehydration may include:   Severe thirst.   Dry lips and mouth.   Dizziness.   Dark urine.   Decreasing urine frequency and amount.   Confusion.   Rapid breathing or pulse.  SEEK IMMEDIATE MEDICAL CARE IF:   You have blood or brown flecks (like coffee grounds) in your vomit.   You have black or bloody stools.   You have a severe headache or stiff neck.   You are confused.   You have severe abdominal pain.   You have chest pain or trouble  breathing.   You do not urinate at least once every 8 hours.   You develop cold or clammy skin.   You continue to vomit for longer than 24 to 48 hours.   You have a fever.  MAKE SURE YOU:   Understand these instructions.   Will watch your condition.   Will get help right away if you are not doing well or get worse.  Document Released: 06/26/2005 Document Revised: 06/15/2011 Document Reviewed: 11/23/2010 [facility] Patient Information 2012 [place], [place].

## 2011-10-07 NOTE — ED Notes (Signed)
Pt c/o persistent vomiting, abd pain and back pain since last Thurs (hx of CVS)

## 2011-10-07 NOTE — ED Provider Notes (Addendum)
History     CSN: [id number]  Arrival date & time 10/07/11  4098   First MD Initiated Contact with Patient 10/07/11 1003      Chief Complaint  Patient presents with  . Emesis  . Abdominal Pain  . Back Pain    (Consider location/radiation/quality/duration/timing/severity/associated sxs/prior treatment) HPI Comments: Patient has a history of cyclic vomiting syndrome and presents today with her usual symptoms per her report.  She states her symptoms began on Thursday with generalized abdominal pain and nausea and vomiting.  She's been trying to use her Phenergan and Dilaudid at home both by mouth and suppository forms and has been unable to control her symptoms.  She comes in today because of her lack of control of her symptoms.  She denies any fevers.  Denies any dysuria.  Her last menstrual period is ongoing currently.  No other cough, cold or shortness of breath symptoms.  Patient is a 25 y.o. female presenting with vomiting, abdominal pain, and back pain. The history is provided by the patient. No language interpreter was used.  Emesis  This is a recurrent problem. The current episode started 2 days ago. The problem has not changed since onset.The emesis has an appearance of stomach contents. There has been no fever. Associated symptoms include abdominal pain. Pertinent negatives include no arthralgias, no chills, no cough, no diarrhea, no fever, no headaches, no myalgias, no sweats and no URI.  Abdominal Pain The primary symptoms of the illness include abdominal pain, nausea and vomiting. The primary symptoms of the illness do not include fever, shortness of breath, diarrhea, dysuria or vaginal discharge.  Additional symptoms associated with the illness include back pain. Symptoms associated with the illness do not include chills.  Back Pain  Associated symptoms include abdominal pain. Pertinent negatives include no chest pain, no fever, no headaches and no dysuria.    Past Medical  History  Diagnosis Date  . Hypertension   . Cyclical vomiting   . CVS disease     Past Surgical History  Procedure Date  . Tonsillectomy   . Tubal ligation   . Cesarean section   . Cholecystectomy   . Cesarean section   . Tubal ligation     History reviewed. No pertinent family history.  History  Substance Use Topics  . Smoking status: Never Smoker   . Smokeless tobacco: Never Used  . Alcohol Use: No    OB History    Grav Para Term Preterm Abortions TAB SAB Ect Mult Living                  Review of Systems  Constitutional: Negative.  Negative for fever and chills.  HENT: Negative.   Eyes: Negative.  Negative for discharge and redness.  Respiratory: Negative.  Negative for cough and shortness of breath.   Cardiovascular: Negative.  Negative for chest pain.  Gastrointestinal: Positive for nausea, vomiting and abdominal pain. Negative for diarrhea.  Genitourinary: Negative.  Negative for dysuria and vaginal discharge.  Musculoskeletal: Positive for back pain. Negative for myalgias and arthralgias.  Skin: Negative.  Negative for color change and rash.  Neurological: Negative.  Negative for syncope and headaches.  Hematological: Negative.  Negative for adenopathy.  Psychiatric/Behavioral: Negative.  Negative for confusion.  All other systems reviewed and are negative.    Allergies  Other; Peanut-containing drug products; Percocet; Tomato; and Zofran  Home Medications   Current Outpatient Rx  Name Route Sig Dispense Refill  . FLINTSTONES COMPLETE 60 MG  PO CHEW Oral Chew 1 tablet by mouth daily.      Marland Kitchen LABETALOL HCL 100 MG PO TABS Oral Take 100 mg by mouth 2 (two) times daily.      . ATIVAN PO Oral Take 1 tablet by mouth 2 (two) times daily.      Marland Kitchen POTASSIUM CHLORIDE ER 10 MEQ PO TBCR Oral Take 1 tablet (10 mEq total) by mouth 2 (two) times daily. 6 tablet 0  . PROMETHAZINE HCL 25 MG RE SUPP Rectal Place 25 mg rectally every 6 (six) hours as needed. For nausea  and vomiting    . PROMETHAZINE HCL 25 MG RE SUPP Rectal Place 1 suppository (25 mg total) rectally every 6 (six) hours as needed for nausea. 12 each 0  . PROMETHAZINE HCL 25 MG RE SUPP Rectal Place 1 suppository (25 mg total) rectally every 6 (six) hours as needed for nausea. 12 each 0  . PROMETHAZINE HCL 25 MG PO TABS Oral Take 25 mg by mouth every 6 (six) hours as needed. Nausea and vomiting     . PROMETHAZINE HCL 25 MG PO TABS Oral Take 1 tablet (25 mg total) by mouth every 6 (six) hours as needed for nausea. 30 tablet 0    BP 177/119  Pulse 107  Temp(Src) 98.2 F (36.8 C) (Oral)  Resp 20  SpO2 100%  LMP 10/03/2011  Physical Exam  Nursing note and vitals reviewed. Constitutional: She is oriented to person, place, and time. She appears well-developed and well-nourished.  Non-toxic appearance. She does not have a sickly appearance.  HENT:  Head: Normocephalic and atraumatic.  Eyes: Conjunctivae, EOM and lids are normal. Pupils are equal, round, and reactive to light. No scleral icterus.  Neck: Trachea normal and normal range of motion. Neck supple.  Cardiovascular: Normal rate, regular rhythm and normal heart sounds.   Pulmonary/Chest: Effort normal and breath sounds normal. No respiratory distress. She has no wheezes. She has no rales.  Abdominal: Soft. Normal appearance. There is no tenderness. There is no rebound, no guarding and no CVA tenderness.  Musculoskeletal: Normal range of motion.  Neurological: She is alert and oriented to person, place, and time. She has normal strength.  Skin: Skin is warm, dry and intact. No rash noted.  Psychiatric: She has a normal mood and affect. Her behavior is normal. Judgment and thought content normal.    ED Course  Procedures (including critical care time)  Results for orders placed during the hospital encounter of 10/07/11  URINALYSIS, ROUTINE W REFLEX MICROSCOPIC      Component Value Range   Color, Urine AMBER (*) YELLOW    APPearance  CLEAR  CLEAR    Specific Gravity, Urine 1.036 (*) 1.005 - 1.030    pH 6.5  5.0 - 8.0    Glucose, UA NEGATIVE  NEGATIVE (mg/dL)   Hgb urine dipstick MODERATE (*) NEGATIVE    Bilirubin Urine NEGATIVE  NEGATIVE    Ketones, ur 40 (*) NEGATIVE (mg/dL)   Protein, ur 30 (*) NEGATIVE (mg/dL)   Urobilinogen, UA 0.2  0.0 - 1.0 (mg/dL)   Nitrite NEGATIVE  NEGATIVE    Leukocytes, UA NEGATIVE  NEGATIVE   PREGNANCY, URINE      Component Value Range   Preg Test, Ur NEGATIVE  NEGATIVE   URINE MICROSCOPIC-ADD ON      Component Value Range   Squamous Epithelial / LPF RARE  RARE    WBC, UA 3-6  <3 (WBC/hpf)   RBC / HPF 7-10  <  3 (RBC/hpf)   Bacteria, UA MANY (*) RARE    Crystals CA OXALATE CRYSTALS (*) NEGATIVE    Urine-Other MUCOUS PRESENT         MDM  Patient with 2 days of abdominal pain and nausea vomiting similar to her past cyclic vomiting syndrome presentations.  Patient has a benign abdomen on exam and so appears to be less likely to be an acute abdominal surgical pathology at this time.  I will check a urinalysis for UTI and pregnancy test to rule out pregnancy. I will treat patient's pain and nausea and give IV fluids for hydration to attempt to correct her cyclic vomiting.        Lezlie Octave, MD 10/07/11 1011  Patient does appear that she may have a UTI although I do  suspect the blood is related to her menses.  Patient has received a dose of ceftriaxone here and I will send her home on Bactrim.  Patient's pain is resolved at this time.  Her nausea is improving and she is currently tolerating ice chips.  Lezlie Octave, MD 10/07/11 1244  Patient's nausea is better and she feels well enough to go home at this time.  Lezlie Octave, MD 10/07/11 [phone]

## 2011-12-30 ENCOUNTER — Encounter (HOSPITAL_BASED_OUTPATIENT_CLINIC_OR_DEPARTMENT_OTHER): Payer: Self-pay

## 2011-12-30 ENCOUNTER — Emergency Department (HOSPITAL_BASED_OUTPATIENT_CLINIC_OR_DEPARTMENT_OTHER)
Admission: EM | Admit: 2011-12-30 | Discharge: 2011-12-30 | Disposition: A | Discharge status: Home / Self Care | Payer: Self-pay | Attending: Emergency Medicine | Admitting: Emergency Medicine

## 2011-12-30 DIAGNOSIS — I10 Essential (primary) hypertension: Secondary | ICD-10-CM | POA: Insufficient documentation

## 2011-12-30 DIAGNOSIS — R109 Unspecified abdominal pain: Secondary | ICD-10-CM

## 2011-12-30 DIAGNOSIS — Z79899 Other long term (current) drug therapy: Secondary | ICD-10-CM | POA: Insufficient documentation

## 2011-12-30 DIAGNOSIS — R1110 Vomiting, unspecified: Secondary | ICD-10-CM

## 2011-12-30 LAB — URINE MICROSCOPIC-ADD ON

## 2011-12-30 LAB — URINALYSIS, ROUTINE W REFLEX MICROSCOPIC
Bilirubin Urine: NEGATIVE
Glucose, UA: NEGATIVE mg/dL
Ketones, ur: 40 mg/dL — AB
Specific Gravity, Urine: 1.014 (ref 1.005–1.030)
pH: 7.5 (ref 5.0–8.0)

## 2011-12-30 LAB — BASIC METABOLIC PANEL
BUN: 9 mg/dL (ref 6–23)
Chloride: 105 mEq/L (ref 96–112)
Creatinine, Ser: 0.8 mg/dL (ref 0.50–1.10)
GFR calc Af Amer: >90 mL/min (ref >90)
Glucose, Bld: 101 mg/dL — ABNORMAL HIGH (ref 70–99)
Potassium: 3.5 mEq/L (ref 3.5–5.1)

## 2011-12-30 MED ORDER — MORPHINE SULFATE 4 MG/ML IJ SOLN
4.0000 mg | Freq: Once | INTRAMUSCULAR | Status: AC
  Administered 2011-12-30: 4 mg via INTRAVENOUS
  Filled 2011-12-30: qty 1

## 2011-12-30 MED ORDER — PROMETHAZINE HCL 25 MG/ML IJ SOLN
12.5000 mg | Freq: Once | INTRAMUSCULAR | Status: AC
  Administered 2011-12-30: 12.5 mg via INTRAVENOUS
  Filled 2011-12-30: qty 1

## 2011-12-30 MED ORDER — METOCLOPRAMIDE HCL 5 MG/ML IJ SOLN
10.0000 mg | Freq: Once | INTRAMUSCULAR | Status: AC
  Administered 2011-12-30: 10 mg via INTRAVENOUS
  Filled 2011-12-30: qty 2

## 2011-12-30 MED ORDER — PROMETHAZINE HCL 25 MG PO TABS: 25.0000 mg | ORAL_TABLET | Freq: Four times a day (QID) | ORAL | Status: DC | PRN

## 2011-12-30 MED ORDER — LORAZEPAM 2 MG/ML IJ SOLN
2.0000 mg | Freq: Once | INTRAMUSCULAR | Status: AC
  Administered 2011-12-30: 2 mg via INTRAVENOUS
  Filled 2011-12-30: qty 1

## 2011-12-30 MED ORDER — SODIUM CHLORIDE 0.9 % IV BOLUS (SEPSIS)
1000.0000 mL | Freq: Once | INTRAVENOUS | Status: AC
  Administered 2011-12-30: 1000 mL via INTRAVENOUS

## 2011-12-30 MED ORDER — DROPERIDOL 2.5 MG/ML IJ SOLN: 2.5000 mg | Freq: Once | INTRAMUSCULAR | Status: DC

## 2011-12-30 NOTE — ED Notes (Signed)
D/c home with ride- pt informed of phenergan e-prescribed to [facility]

## 2011-12-30 NOTE — ED Notes (Signed)
Pt calling for ride home

## 2011-12-30 NOTE — ED Notes (Signed)
Pt c/o abdominal pain for past 3 days.  Pt states she has CVS and takes phenergan at home for control.  Pt attempted to use PR but due to hemorrhoid unable to tolerate.

## 2011-12-30 NOTE — Discharge Instructions (Signed)
Clear Liquid Diet The clear liquid dietconsists of foods that are liquid or will become liquid at room temperature.You should be able to see through the liquid and beverages. Examples of foods allowed on a clear liquid diet include fruit juice, broth or bouillon, gelatin, or frozen ice pops. The purpose of this diet is to provide necessary fluid, electrolytes such as sodium and potassium, and energy to keep the body functioning during times when you are not able to consume a regular diet.A clear liquid diet should not be continued for long periods of time as it is not nutritionally adequate.  REASONS FOR USING A CLEAR LIQUID DIET  In sudden onset (acute) conditions for a patient before or after surgery.   As the first step in oral feeding.   For fluid and electrolyte replacement in diarrheal diseases.   As a diet before certain medical tests are performed.  ADEQUACY The clear liquid diet is adequate only in ascorbic acid, according to the Recommended Dietary Allowances of the [employer]. CHOOSING FOODS Breads and Starches  Allowed:  None are allowed.   Avoid: All are avoided.  Vegetables  Allowed:  Strained tomato or vegetable juice.   Avoid: Any others.  Fruit  Allowed:  Strained fruit juices and fruit drinks. Include 1 serving of citrus or vitamin C-enriched fruit juice daily.   Avoid: Any others.  Meat and Meat Substitutes  Allowed:  None are allowed.   Avoid: All are avoided.  Milk  Allowed:  None are allowed.   Avoid: All are avoided.  Soups and Combination Foods  Allowed:  Clear bouillon, broth, or strained broth-based soups.   Avoid: Any others.  Desserts and Sweets  Allowed:  Sugar, honey. High protein gelatin. Flavored gelatin, ices, or frozen ice pops that do not contain milk.   Avoid: Any others.  Fats and Oils  Allowed:  None are allowed.   Avoid: All are avoided.  Beverages  Allowed: Cereal beverages, coffee (regular or  decaffeinated), tea, or soda at the discretion of your caregiver.   Avoid: Any others.  Condiments  Allowed:  Iodized salt.   Avoid: Any others, including pepper.  Supplements  Allowed:  Liquid nutrition beverages.   Avoid: Any others that contain lactose or fiber.  SAMPLE MEAL PLAN Breakfast  4 oz (120 mL) strained orange juice.    to 1 cup (125 to 250 mL) gelatin (plain or fortified).   1 cup (250 mL) beverage (coffee or tea).   Sugar, if desired.  Midmorning Snack   cup (125 mL) gelatin (plain or fortified).  Lunch  1 cup (250 mL) broth or consomm.   4 oz (120 mL) strained grapefruit juice.    cup (125 mL) gelatin (plain or fortified).   1 cup (250 mL) beverage (coffee or tea).   Sugar, if desired.  Midafternoon Snack   cup (125 mL) fruit ice.    cup (125 mL) strained fruit juice.  Dinner  1 cup (250 mL) broth or consomm.    cup (125 mL) cranberry juice.    cup (125 mL) flavored gelatin (plain or fortified).   1 cup (250 mL) beverage (coffee or tea).   Sugar, if desired.  Evening Snack  4 oz (120 mL) strained apple juice (vitamin C-fortified).    cup (125 mL) flavored gelatin (plain or fortified).  Document Released: 06/26/2005 Document Revised: 06/15/2011 Document Reviewed: 09/23/2010 [facility] Patient Information 2012 [place].

## 2011-12-30 NOTE — ED Provider Notes (Signed)
History     CSN: [id number]  Arrival date & time 12/30/11  1250   First MD Initiated Contact with Patient 12/30/11 1259      Chief Complaint  Patient presents with  . Abdominal Pain  . Emesis    (Consider location/radiation/quality/duration/timing/severity/associated sxs/prior treatment) HPI Comments: Pt c/o persistent vomiting without relief:pt states that she has had a history of this since she was 19  Patient is a 26 y.o. female presenting with abdominal pain. The history is provided by the patient. No language interpreter was used.  Abdominal Pain The primary symptoms of the illness include nausea. The current episode started more than 2 days ago. The onset of the illness was gradual. The problem has not changed since onset.   Past Medical History  Diagnosis Date  . Hypertension   . Cyclical vomiting   . CVS disease     Past Surgical History  Procedure Date  . Tonsillectomy   . Tubal ligation   . Cesarean section   . Cholecystectomy   . Cesarean section   . Tubal ligation     No family history on file.  History  Substance Use Topics  . Smoking status: Never Smoker   . Smokeless tobacco: Never Used  . Alcohol Use: No    OB History    Grav Para Term Preterm Abortions TAB SAB Ect Mult Living                  Review of Systems  Gastrointestinal: Positive for nausea.    Allergies  Other; Peanut-containing drug products; Percocet; Tomato; and Zofran  Home Medications   Current Outpatient Rx  Name Route Sig Dispense Refill  . FLINTSTONES COMPLETE 60 MG PO CHEW Oral Chew 1 tablet by mouth daily.      Marland Kitchen LABETALOL HCL 100 MG PO TABS Oral Take 100 mg by mouth 2 (two) times daily.      . ATIVAN PO Oral Take 1 tablet by mouth 2 (two) times daily.      Marland Kitchen POTASSIUM CHLORIDE ER 10 MEQ PO TBCR Oral Take 1 tablet (10 mEq total) by mouth 2 (two) times daily. 6 tablet 0  . PROMETHAZINE HCL 25 MG RE SUPP Rectal Place 25 mg rectally every 6 (six) hours as needed.  For nausea and vomiting    . PROMETHAZINE HCL 25 MG RE SUPP Rectal Place 1 suppository (25 mg total) rectally every 6 (six) hours as needed for nausea. 12 each 0  . PROMETHAZINE HCL 25 MG RE SUPP Rectal Place 1 suppository (25 mg total) rectally every 6 (six) hours as needed for nausea. 12 each 0  . PROMETHAZINE HCL 25 MG RE SUPP Rectal Place 1 suppository (25 mg total) rectally every 6 (six) hours as needed for nausea. 20 each 0  . PROMETHAZINE HCL 25 MG PO TABS Oral Take 25 mg by mouth every 6 (six) hours as needed. Nausea and vomiting     . PROMETHAZINE HCL 25 MG PO TABS Oral Take 1 tablet (25 mg total) by mouth every 6 (six) hours as needed for nausea. 30 tablet 0  . PROMETHAZINE HCL 25 MG PO TABS Oral Take 1 tablet (25 mg total) by mouth every 6 (six) hours as needed for nausea. 15 tablet 0    BP 180/111  Pulse 109  Temp 98.4 F (36.9 C) (Oral)  Resp 16  Ht 5' 4"  (1.626 m)  Wt 148 lb (67.132 kg)  BMI 25.40 kg/m2  SpO2 100%  LMP 12/28/2011  Physical Exam  Nursing note and vitals reviewed. Constitutional: She is oriented to person, place, and time. She appears well-developed and well-nourished.  HENT:  Head: Normocephalic and atraumatic.  Eyes: EOM are normal.  Neck: Neck supple.  Cardiovascular: Normal rate and regular rhythm.   Pulmonary/Chest: Effort normal and breath sounds normal. No respiratory distress.  Abdominal: Soft. Bowel sounds are normal. There is no tenderness.  Musculoskeletal: Normal range of motion.  Neurological: She is alert and oriented to person, place, and time.  Skin: Skin is warm and dry.  Psychiatric: She has a normal mood and affect.    ED Course  Procedures (including critical care time)  Labs Reviewed  BASIC METABOLIC PANEL - Abnormal; Notable for the following:    Glucose, Bld 101 (*)     All other components within normal limits  URINALYSIS, ROUTINE W REFLEX MICROSCOPIC - Abnormal; Notable for the following:    Hgb urine dipstick SMALL (*)      Ketones, ur 40 (*)     All other components within normal limits  PREGNANCY, URINE  URINE MICROSCOPIC-ADD ON   No results found.  Date: 12/30/2011  Rate:94  Rhythm: normal sinus rhythm  QRS Axis: normal  Intervals: QT prolonged  ST/T Wave abnormalities: normal  Conduction Disutrbances:none  Narrative Interpretation:   Old EKG Reviewed: unchanged    1. Vomiting   2. Abdominal pain       MDM  5:48 PM Pt requesting droperidol:pt has prolonged TP:[id number] to pt why not given 7:00 PM Pt is tolerating po at this time:will send home        Glendell Docker, NP 12/30/11 [place], NP 12/30/11 1902

## 2011-12-31 NOTE — ED Provider Notes (Signed)
Medical screening examination/treatment/procedure(s) were performed by non-physician practitioner and as supervising physician I was immediately available for consultation/collaboration.   Kimba Lottes B. Karle Starch, MD 12/31/11 [phone]

## 2012-01-02 ENCOUNTER — Emergency Department (HOSPITAL_BASED_OUTPATIENT_CLINIC_OR_DEPARTMENT_OTHER)
Admission: EM | Admit: 2012-01-02 | Discharge: 2012-01-02 | Disposition: A | Discharge status: Home / Self Care | Payer: Self-pay | Attending: Emergency Medicine | Admitting: Emergency Medicine

## 2012-01-02 ENCOUNTER — Encounter (HOSPITAL_BASED_OUTPATIENT_CLINIC_OR_DEPARTMENT_OTHER): Payer: Self-pay | Admitting: Emergency Medicine

## 2012-01-02 DIAGNOSIS — Z91010 Allergy to peanuts: Secondary | ICD-10-CM | POA: Insufficient documentation

## 2012-01-02 DIAGNOSIS — Z885 Allergy status to narcotic agent status: Secondary | ICD-10-CM | POA: Insufficient documentation

## 2012-01-02 DIAGNOSIS — Z888 Allergy status to other drugs, medicaments and biological substances status: Secondary | ICD-10-CM | POA: Insufficient documentation

## 2012-01-02 DIAGNOSIS — I10 Essential (primary) hypertension: Secondary | ICD-10-CM | POA: Insufficient documentation

## 2012-01-02 DIAGNOSIS — Z91018 Allergy to other foods: Secondary | ICD-10-CM | POA: Insufficient documentation

## 2012-01-02 DIAGNOSIS — R1115 Cyclical vomiting syndrome unrelated to migraine: Secondary | ICD-10-CM | POA: Insufficient documentation

## 2012-01-02 LAB — URINALYSIS, ROUTINE W REFLEX MICROSCOPIC
Nitrite: NEGATIVE
Protein, ur: NEGATIVE mg/dL
Specific Gravity, Urine: 1.004 — ABNORMAL LOW (ref 1.005–1.030)
Urobilinogen, UA: 0.2 mg/dL (ref 0.0–1.0)

## 2012-01-02 LAB — URINE MICROSCOPIC-ADD ON

## 2012-01-02 MED ORDER — HYDROMORPHONE HCL PF 1 MG/ML IJ SOLN
1.0000 mg | Freq: Once | INTRAMUSCULAR | Status: AC
  Administered 2012-01-02: 1 mg via INTRAVENOUS
  Filled 2012-01-02: qty 1

## 2012-01-02 MED ORDER — SODIUM CHLORIDE 0.9 % IV SOLN
Freq: Once | INTRAVENOUS | Status: AC
  Administered 2012-01-02: 1000 mL via INTRAVENOUS

## 2012-01-02 MED ORDER — PROMETHAZINE HCL 25 MG/ML IJ SOLN
25.0000 mg | Freq: Once | INTRAMUSCULAR | Status: AC
  Administered 2012-01-02: 25 mg via INTRAVENOUS
  Filled 2012-01-02: qty 1

## 2012-01-02 NOTE — ED Notes (Signed)
Pt states she is feeling better and is ready to go home. MD made aware.

## 2012-01-02 NOTE — ED Provider Notes (Signed)
History     CSN: [id number]  Arrival date & time 01/02/12  0024   First MD Initiated Contact with Patient 01/02/12 0104      Chief Complaint  Patient presents with  . Emesis    (Consider location/radiation/quality/duration/timing/severity/associated sxs/prior treatment) Patient is a 26 y.o. female presenting with vomiting. The history is provided by the patient.  Emesis  This is a chronic problem. Associated symptoms include abdominal pain and diarrhea. Pertinent negatives include no chills, no fever and no headaches.   patient has a history of cyclic vomiting syndrome. She states she's had nausea vomiting some diarrhea for the last 6 days. She states this is typical exacerbation for her. No fevers. Some mild diffuse abdominal pain. No sick contacts. She denies possibility of pregnancy.  Past Medical History  Diagnosis Date  . Hypertension   . Cyclical vomiting   . CVS disease     Past Surgical History  Procedure Date  . Tonsillectomy   . Tubal ligation   . Cesarean section   . Cholecystectomy   . Cesarean section   . Tubal ligation     No family history on file.  History  Substance Use Topics  . Smoking status: Never Smoker   . Smokeless tobacco: Never Used  . Alcohol Use: No    OB History    Grav Para Term Preterm Abortions TAB SAB Ect Mult Living                  Review of Systems  Constitutional: Negative for fever, chills, activity change and appetite change.  HENT: Negative for neck stiffness.   Eyes: Negative for pain.  Respiratory: Negative for chest tightness and shortness of breath.   Cardiovascular: Negative for chest pain and leg swelling.  Gastrointestinal: Positive for nausea, vomiting, abdominal pain and diarrhea.  Genitourinary: Negative for dysuria and flank pain.  Musculoskeletal: Negative for back pain.  Skin: Negative for rash.  Neurological: Negative for weakness, numbness and headaches.  Psychiatric/Behavioral: Negative for  behavioral problems.    Allergies  Other; Peanut-containing drug products; Percocet; Tomato; and Zofran  Home Medications   Current Outpatient Rx  Name Route Sig Dispense Refill  . FLINTSTONES COMPLETE 60 MG PO CHEW Oral Chew 1 tablet by mouth daily.      Marland Kitchen LABETALOL HCL 100 MG PO TABS Oral Take 100 mg by mouth 2 (two) times daily.      . ATIVAN PO Oral Take 1 tablet by mouth 2 (two) times daily.      Marland Kitchen POTASSIUM CHLORIDE ER 10 MEQ PO TBCR Oral Take 1 tablet (10 mEq total) by mouth 2 (two) times daily. 6 tablet 0  . PROMETHAZINE HCL 25 MG RE SUPP Rectal Place 25 mg rectally every 6 (six) hours as needed. For nausea and vomiting    . PROMETHAZINE HCL 25 MG RE SUPP Rectal Place 1 suppository (25 mg total) rectally every 6 (six) hours as needed for nausea. 12 each 0  . PROMETHAZINE HCL 25 MG RE SUPP Rectal Place 1 suppository (25 mg total) rectally every 6 (six) hours as needed for nausea. 12 each 0  . PROMETHAZINE HCL 25 MG RE SUPP Rectal Place 1 suppository (25 mg total) rectally every 6 (six) hours as needed for nausea. 20 each 0  . PROMETHAZINE HCL 25 MG PO TABS Oral Take 1 tablet (25 mg total) by mouth every 6 (six) hours as needed for nausea. 30 tablet 0  . PROMETHAZINE HCL 25 MG PO  TABS Oral Take 1 tablet (25 mg total) by mouth every 6 (six) hours as needed for nausea. 15 tablet 0  . PROMETHAZINE HCL 25 MG PO TABS Oral Take 1 tablet (25 mg total) by mouth every 6 (six) hours as needed for nausea. 20 tablet 0    BP 159/136  Pulse 114  Temp 99.3 F (37.4 C) (Oral)  Resp 16  SpO2 100%  LMP 12/28/2011  Physical Exam  Nursing note and vitals reviewed. Constitutional: She is oriented to person, place, and time. She appears well-developed and well-nourished.  HENT:  Head: Normocephalic and atraumatic.  Eyes: EOM are normal. Pupils are equal, round, and reactive to light.  Neck: Normal range of motion. Neck supple.  Cardiovascular: Regular rhythm and normal heart sounds.   No  murmur heard.      Tachycardia  Pulmonary/Chest: Effort normal and breath sounds normal. No respiratory distress. She has no wheezes. She has no rales.  Abdominal: Soft. Bowel sounds are normal. She exhibits no distension. There is tenderness. There is no rebound and no guarding.       Mild diffuse abdominal pain without rebound guarding.  Musculoskeletal: Normal range of motion.  Neurological: She is alert and oriented to person, place, and time. No cranial nerve deficit.  Skin: Skin is warm and dry.  Psychiatric: She has a normal mood and affect. Her speech is normal.    ED Course  Procedures (including critical care time)  Labs Reviewed  URINALYSIS, ROUTINE W REFLEX MICROSCOPIC - Abnormal; Notable for the following:    Specific Gravity, Urine 1.004 (*)     Hgb urine dipstick LARGE (*)     Ketones, ur 15 (*)     All other components within normal limits  URINE MICROSCOPIC-ADD ON   No results found.   1. Cyclic vomiting syndrome       MDM  Acute on chronic vomiting with a history of cyclic vomiting syndrome. Patient has received Dilaudid and Phenergan as tolerated orals and was asking to go home. She was discharged        Jasper Riling. Alvino Chapel, MD 01/02/12 [phone]

## 2012-01-02 NOTE — ED Notes (Signed)
Nausea and vomiting x6days  Hx of same

## 2012-01-26 ENCOUNTER — Encounter (HOSPITAL_BASED_OUTPATIENT_CLINIC_OR_DEPARTMENT_OTHER): Payer: Self-pay | Admitting: *Deleted

## 2012-01-26 ENCOUNTER — Emergency Department (HOSPITAL_BASED_OUTPATIENT_CLINIC_OR_DEPARTMENT_OTHER)
Admission: EM | Admit: 2012-01-26 | Discharge: 2012-01-27 | Disposition: A | Discharge status: Home / Self Care | Payer: Self-pay | Attending: Emergency Medicine | Admitting: Emergency Medicine

## 2012-01-26 DIAGNOSIS — Z91010 Allergy to peanuts: Secondary | ICD-10-CM | POA: Insufficient documentation

## 2012-01-26 DIAGNOSIS — I10 Essential (primary) hypertension: Secondary | ICD-10-CM | POA: Insufficient documentation

## 2012-01-26 DIAGNOSIS — R1115 Cyclical vomiting syndrome unrelated to migraine: Secondary | ICD-10-CM

## 2012-01-26 LAB — BASIC METABOLIC PANEL
BUN: 8 mg/dL (ref 6–23)
Chloride: 102 mEq/L (ref 96–112)
Creatinine, Ser: 0.7 mg/dL (ref 0.50–1.10)
GFR calc Af Amer: >90 mL/min (ref >90)

## 2012-01-26 LAB — URINALYSIS, ROUTINE W REFLEX MICROSCOPIC
Bilirubin Urine: NEGATIVE
Ketones, ur: >80 mg/dL — AB
Nitrite: NEGATIVE
Urobilinogen, UA: 0.2 mg/dL (ref 0.0–1.0)
pH: 7 (ref 5.0–8.0)

## 2012-01-26 LAB — URINE MICROSCOPIC-ADD ON

## 2012-01-26 LAB — PREGNANCY, URINE: Preg Test, Ur: NEGATIVE

## 2012-01-26 MED ORDER — PROMETHAZINE HCL 25 MG/ML IJ SOLN
25.0000 mg | Freq: Once | INTRAMUSCULAR | Status: AC
  Administered 2012-01-26: 25 mg via INTRAVENOUS
  Filled 2012-01-26: qty 1

## 2012-01-26 MED ORDER — PROMETHAZINE HCL 25 MG PO TABS: 25.0000 mg | ORAL_TABLET | Freq: Four times a day (QID) | ORAL | Status: DC | PRN

## 2012-01-26 MED ORDER — PROMETHAZINE HCL 25 MG RE SUPP: 25.0000 mg | Freq: Four times a day (QID) | RECTAL | Status: DC | PRN

## 2012-01-26 MED ORDER — METOCLOPRAMIDE HCL 5 MG/ML IJ SOLN
10.0000 mg | Freq: Once | INTRAMUSCULAR | Status: AC
  Administered 2012-01-26: 10 mg via INTRAVENOUS
  Filled 2012-01-26: qty 2

## 2012-01-26 MED ORDER — SODIUM CHLORIDE 0.9 % IV BOLUS (SEPSIS)
1000.0000 mL | Freq: Once | INTRAVENOUS | Status: AC
  Administered 2012-01-26: 1000 mL via INTRAVENOUS

## 2012-01-26 NOTE — ED Notes (Signed)
Vomiting since yesterday. Has not been able to keep her BP medication down. States she is sob. Lungs clear. Oxygen saturation 100%. States she needs a bag to breath into for hyperventilation and anxiety.

## 2012-01-26 NOTE — ED Provider Notes (Signed)
History/physical exam/procedure(s) were performed by non-physician practitioner and as supervising physician I was immediately available for consultation/collaboration. I have reviewed all notes and am in agreement with care and plan.   Shaune Pollack, MD 01/26/12 [phone]

## 2012-01-26 NOTE — ED Provider Notes (Signed)
History     CSN: [id number]  Arrival date & time 01/26/12  8003   First MD Initiated Contact with Patient 01/26/12 2027      Chief Complaint  Patient presents with  . Emesis    (Consider location/radiation/quality/duration/timing/severity/associated sxs/prior treatment) HPI Comments: Pt states that she has a history of cyclic vomiting and she started again yesterday:pt states that she is out of her phenergan so she hasn't taken anything:pt states that dilaudid and phenergan made her feel better  Patient is a 26 y.o. female presenting with vomiting. The history is provided by the patient. No language interpreter was used.  Emesis  This is a chronic problem. The current episode started yesterday. The problem occurs 5 to 10 times per day. The problem has not changed since onset.The emesis has an appearance of bilious material. There has been no fever. Pertinent negatives include no diarrhea and no fever.    Past Medical History  Diagnosis Date  . Hypertension   . Cyclical vomiting   . CVS disease     Past Surgical History  Procedure Date  . Tonsillectomy   . Tubal ligation   . Cesarean section   . Cholecystectomy   . Cesarean section   . Tubal ligation     No family history on file.  History  Substance Use Topics  . Smoking status: Never Smoker   . Smokeless tobacco: Never Used  . Alcohol Use: No    OB History    Grav Para Term Preterm Abortions TAB SAB Ect Mult Living                  Review of Systems  Constitutional: Negative for fever.  Respiratory: Negative.   Cardiovascular: Negative.   Gastrointestinal: Positive for vomiting. Negative for diarrhea.  Neurological: Negative.     Allergies  Other; Peanut-containing drug products; Percocet; Tomato; and Zofran  Home Medications   Current Outpatient Rx  Name Route Sig Dispense Refill  . FLINTSTONES COMPLETE 60 MG PO CHEW Oral Chew 1 tablet by mouth daily.      Marland Kitchen LABETALOL HCL 100 MG PO TABS Oral  Take 100 mg by mouth 2 (two) times daily.      . ATIVAN PO Oral Take 1 tablet by mouth 2 (two) times daily. Could not verify this medication.  Grandmother filled and she doesn't know the pharmacy.    Marland Kitchen PROMETHAZINE HCL 25 MG RE SUPP Rectal Place 1 suppository (25 mg total) rectally every 6 (six) hours as needed for nausea. 12 each 0  . PROMETHAZINE HCL 25 MG RE SUPP Rectal Place 1 suppository (25 mg total) rectally every 6 (six) hours as needed for nausea. 12 each 0  . PROMETHAZINE HCL 25 MG RE SUPP Rectal Place 1 suppository (25 mg total) rectally every 6 (six) hours as needed for nausea. 20 each 0  . PROMETHAZINE HCL 25 MG PO TABS Oral Take 1 tablet (25 mg total) by mouth every 6 (six) hours as needed for nausea. 30 tablet 0  . PROMETHAZINE HCL 25 MG PO TABS Oral Take 1 tablet (25 mg total) by mouth every 6 (six) hours as needed for nausea. 15 tablet 0  . PROMETHAZINE HCL 25 MG PO TABS Oral Take 1 tablet (25 mg total) by mouth every 6 (six) hours as needed for nausea. 20 tablet 0    BP 149/97  Pulse 101  Temp 98.4 F (36.9 C) (Oral)  Resp 17  SpO2 100%  LMP 12/28/2011  Physical Exam  Nursing note and vitals reviewed. Constitutional: She is oriented to person, place, and time. She appears well-developed and well-nourished.  HENT:  Head: Normocephalic.  Eyes: Conjunctivae and EOM are normal.  Neck: Neck supple.  Cardiovascular: Normal rate and regular rhythm.   Pulmonary/Chest: Effort normal and breath sounds normal.  Abdominal: Soft. Bowel sounds are normal. There is no tenderness.  Musculoskeletal: Normal range of motion.  Neurological: She is alert and oriented to person, place, and time.  Skin: Skin is warm and dry.  Psychiatric: She has a normal mood and affect.    ED Course  Procedures (including critical care time)  Labs Reviewed  URINALYSIS, ROUTINE W REFLEX MICROSCOPIC - Abnormal; Notable for the following:    Hgb urine dipstick MODERATE (*)     Ketones, ur >80 (*)       Protein, ur 30 (*)     All other components within normal limits  BASIC METABOLIC PANEL - Abnormal; Notable for the following:    Potassium 3.4 (*)     CO2 18 (*)     Glucose, Bld 120 (*)     All other components within normal limits  PREGNANCY, URINE  URINE MICROSCOPIC-ADD ON   No results found.   1. Cyclical vomiting       MDM  Abdomen is benign:doubt acute pathology:pt getting fluids at this time:will have Dr. Jeanell Sparrow follow pt       Glendell Docker, NP 01/26/12 2203

## 2012-01-26 NOTE — ED Notes (Signed)
Unable to tolerated any po fluids

## 2012-01-29 ENCOUNTER — Emergency Department (HOSPITAL_BASED_OUTPATIENT_CLINIC_OR_DEPARTMENT_OTHER)
Admission: EM | Admit: 2012-01-29 | Discharge: 2012-01-30 | Disposition: A | Discharge status: Home / Self Care | Payer: Self-pay | Attending: Emergency Medicine | Admitting: Emergency Medicine

## 2012-01-29 ENCOUNTER — Encounter (HOSPITAL_BASED_OUTPATIENT_CLINIC_OR_DEPARTMENT_OTHER): Payer: Self-pay | Admitting: Emergency Medicine

## 2012-01-29 DIAGNOSIS — R1115 Cyclical vomiting syndrome unrelated to migraine: Secondary | ICD-10-CM | POA: Insufficient documentation

## 2012-01-29 LAB — COMPREHENSIVE METABOLIC PANEL
Alkaline Phosphatase: 76 U/L (ref 39–117)
BUN: 4 mg/dL — ABNORMAL LOW (ref 6–23)
CO2: 24 mEq/L (ref 19–32)
Chloride: 101 mEq/L (ref 96–112)
Creatinine, Ser: 0.7 mg/dL (ref 0.50–1.10)
GFR calc Af Amer: >90 mL/min (ref >90)
GFR calc non Af Amer: >90 mL/min (ref >90)
Glucose, Bld: 108 mg/dL — ABNORMAL HIGH (ref 70–99)
Total Bilirubin: 0.7 mg/dL (ref 0.3–1.2)

## 2012-01-29 LAB — CBC WITH DIFFERENTIAL/PLATELET
Basophils Relative: 0 % (ref 0–1)
HCT: 38 % (ref 36.0–46.0)
Hemoglobin: 13.5 g/dL (ref 12.0–15.0)
Lymphocytes Relative: 6 % — ABNORMAL LOW (ref 12–46)
Lymphs Abs: 1.1 10*3/uL (ref 0.7–4.0)
MCHC: 35.5 g/dL (ref 30.0–36.0)
Monocytes Absolute: 1.1 10*3/uL — ABNORMAL HIGH (ref 0.1–1.0)
Monocytes Relative: 6 % (ref 3–12)
Neutro Abs: 16.2 10*3/uL — ABNORMAL HIGH (ref 1.7–7.7)
RBC: 4.4 MIL/uL (ref 3.87–5.11)

## 2012-01-29 MED ORDER — HYDROMORPHONE HCL PF 1 MG/ML IJ SOLN
1.0000 mg | Freq: Once | INTRAMUSCULAR | Status: AC
  Administered 2012-01-29: 1 mg via INTRAVENOUS
  Filled 2012-01-29: qty 1

## 2012-01-29 MED ORDER — SODIUM CHLORIDE 0.9 % IV BOLUS (SEPSIS)
1000.0000 mL | Freq: Once | INTRAVENOUS | Status: AC
  Administered 2012-01-29: 1000 mL via INTRAVENOUS

## 2012-01-29 MED ORDER — PROMETHAZINE HCL 25 MG/ML IJ SOLN
25.0000 mg | INTRAMUSCULAR | Status: DC | PRN
  Administered 2012-01-29: 25 mg via INTRAVENOUS
  Filled 2012-01-29: qty 1

## 2012-01-29 MED ORDER — POTASSIUM CHLORIDE 10 MEQ/100ML IV SOLN
10.0000 meq | Freq: Once | INTRAVENOUS | Status: AC
  Administered 2012-01-29: 10 meq via INTRAVENOUS
  Filled 2012-01-29: qty 100

## 2012-01-29 MED ORDER — LORAZEPAM 2 MG/ML IJ SOLN
1.0000 mg | Freq: Once | INTRAMUSCULAR | Status: AC
  Administered 2012-01-29: 1 mg via INTRAVENOUS
  Filled 2012-01-29: qty 1

## 2012-01-29 NOTE — ED Notes (Signed)
Pt called ems secondary cyclic vomiting sydrome, seen at ed Friday, and today sent home, called ems secondary vomiting, request to come here

## 2012-01-29 NOTE — ED Provider Notes (Signed)
History   This chart was scribed for Dot Lanes, MD by Roe Coombs. The patient was seen in room MH07/MH07. Patient's care was started at 2044.     CSN: [id number]  Arrival date & time 01/29/12  2044   First MD Initiated Contact with Patient 01/29/12 2130      Chief Complaint  Patient presents with  . Emesis    (Consider location/radiation/quality/duration/timing/severity/associated sxs/prior treatment) Patient is a 26 y.o. female presenting with vomiting. The history is provided by the patient. No language interpreter was used.  Emesis  This is a chronic problem. The current episode started 1 to 2 hours ago. The problem occurs continuously. The problem has not changed since onset.The emesis has an appearance of bilious material. There has been no fever.    Kynsley Whitehouse is a 26 y.o. female who presents to the Emergency Department complaining of persistent emesis onset a couple hours ago. Patient states that she has been diagnosed with cyclic vomiting syndrome. Patient has a current prescription for Phenergan. Patient was transported to the ED by EMS. Patient was seen in the ED on 01/26/12 by Glendell Docker, NP for the same complaint. Patient was unable to keep her anti-emetic medication down. Patient was treated with fluids and discharged. Patient also with medical h/o HTN. Patient is a former smoker.  Patient states that she is lives with cyclic vomiting since the age of 25.  Past Medical History  Diagnosis Date  . Hypertension   . Cyclical vomiting   . CVS disease     Past Surgical History  Procedure Date  . Tonsillectomy   . Tubal ligation   . Cesarean section   . Cholecystectomy   . Cesarean section   . Tubal ligation     History reviewed. No pertinent family history.  History  Substance Use Topics  . Smoking status: Never Smoker   . Smokeless tobacco: Never Used  . Alcohol Use: No    OB History    Grav Para Term Preterm Abortions TAB SAB Ect Mult  Living                  Review of Systems  Gastrointestinal: Positive for vomiting.  All other systems reviewed and are negative.    Allergies  Other; Peanut-containing drug products; Percocet; Tomato; and Zofran  Home Medications   Current Outpatient Rx  Name Route Sig Dispense Refill  . FLINTSTONES COMPLETE 60 MG PO CHEW Oral Chew 1 tablet by mouth daily.      Marland Kitchen LABETALOL HCL 100 MG PO TABS Oral Take 100 mg by mouth 2 (two) times daily.      Marland Kitchen LORAZEPAM 2 MG PO TABS Oral Take 2 mg by mouth at bedtime as needed. For sleep    . PROMETHAZINE HCL 25 MG RE SUPP Rectal Place 25 mg rectally every 6 (six) hours as needed. For nausea    . PROMETHAZINE HCL 25 MG PO TABS Oral Take 25 mg by mouth every 6 (six) hours as needed. For nausea    . PROMETHAZINE HCL 25 MG RE SUPP Rectal Place 1 suppository (25 mg total) rectally every 6 (six) hours as needed for nausea. 12 each 0  . PROMETHAZINE HCL 25 MG RE SUPP Rectal Place 1 suppository (25 mg total) rectally every 6 (six) hours as needed for nausea. 12 each 0  . PROMETHAZINE HCL 25 MG RE SUPP Rectal Place 1 suppository (25 mg total) rectally every 6 (six) hours as needed for nausea.  20 each 0  . PROMETHAZINE HCL 25 MG PO TABS Oral Take 1 tablet (25 mg total) by mouth every 6 (six) hours as needed for nausea. 30 tablet 0  . PROMETHAZINE HCL 25 MG PO TABS Oral Take 1 tablet (25 mg total) by mouth every 6 (six) hours as needed for nausea. 15 tablet 0  . PROMETHAZINE HCL 25 MG PO TABS Oral Take 1 tablet (25 mg total) by mouth every 6 (six) hours as needed for nausea. 20 tablet 0    BP 170/118  Pulse 99  Temp 99.6 F (37.6 C)  SpO2 100%  Physical Exam  Nursing note and vitals reviewed. Constitutional: She is oriented to person, place, and time. She appears well-developed. No distress.  HENT:  Head: Normocephalic and atraumatic.  Eyes: Pupils are equal, round, and reactive to light.  Neck: Normal range of motion.  Cardiovascular: Normal rate  and intact distal pulses.   Pulmonary/Chest: No respiratory distress.  Abdominal: Soft. Normal appearance. She exhibits no distension and no mass. There is no rebound and no guarding.  Musculoskeletal: Normal range of motion.  Neurological: She is alert and oriented to person, place, and time. No cranial nerve deficit.  Skin: Skin is warm and dry. No rash noted.  Psychiatric: She has a normal mood and affect. Her behavior is normal.    ED Course  Procedures (including critical care time)  Scheduled Meds:    .  HYDROmorphone (DILAUDID) injection  1 mg Intravenous Once  . LORazepam  1 mg Intravenous Once  . sodium chloride  1,000 mL Intravenous Once   Continuous Infusions:    . potassium chloride 10 mEq (01/29/12 2231)   PRN Meds:.promethazine  DIAGNOSTIC STUDIES: Oxygen Saturation is 100% on room air, normal by my interpretation.   Results for orders placed during the hospital encounter of 01/29/12  CBC WITH DIFFERENTIAL      Component Value Range   WBC 18.4 (*) 4.0 - 10.5 K/uL   RBC 4.40  3.87 - 5.11 MIL/uL   Hemoglobin 13.5  12.0 - 15.0 g/dL   HCT 38.0  36.0 - 46.0 %   MCV 86.4  78.0 - 100.0 fL   MCH 30.7  26.0 - 34.0 pg   MCHC 35.5  30.0 - 36.0 g/dL   RDW 11.9  11.5 - 15.5 %   Platelets 270  150 - 400 K/uL   Neutrophils Relative 88 (*) 43 - 77 %   Neutro Abs 16.2 (*) 1.7 - 7.7 K/uL   Lymphocytes Relative 6 (*) 12 - 46 %   Lymphs Abs 1.1  0.7 - 4.0 K/uL   Monocytes Relative 6  3 - 12 %   Monocytes Absolute 1.1 (*) 0.1 - 1.0 K/uL   Eosinophils Relative 0  0 - 5 %   Eosinophils Absolute 0.0  0.0 - 0.7 K/uL   Basophils Relative 0  0 - 1 %   Basophils Absolute 0.0  0.0 - 0.1 K/uL  COMPREHENSIVE METABOLIC PANEL      Component Value Range   Sodium 139  135 - 145 mEq/L   Potassium 3.0 (*) 3.5 - 5.1 mEq/L   Chloride 101  96 - 112 mEq/L   CO2 24  19 - 32 mEq/L   Glucose, Bld 108 (*) 70 - 99 mg/dL   BUN 4 (*) 6 - 23 mg/dL   Creatinine, Ser 0.70  0.50 - 1.10 mg/dL    Calcium 9.7  8.4 - 10.5 mg/dL   Total  Protein 8.1  6.0 - 8.3 g/dL   Albumin 4.5  3.5 - 5.2 g/dL   AST 18  0 - 37 U/L   ALT 10  0 - 35 U/L   Alkaline Phosphatase 76  39 - 117 U/L   Total Bilirubin 0.7  0.3 - 1.2 mg/dL   GFR calc non Af Amer >90  >90 mL/min   GFR calc Af Amer >90  >90 mL/min   No results found.   1. Cyclical vomiting syndrome       MDM  I personally performed the services described in this documentation, which was scribed in my presence. The recorded information has been reviewed and considered.    Dot Lanes, MD 02/04/12 2237

## 2012-01-29 NOTE — ED Notes (Signed)
Pt reports cyclic vomiting syndrome,

## 2012-03-06 ENCOUNTER — Encounter (HOSPITAL_BASED_OUTPATIENT_CLINIC_OR_DEPARTMENT_OTHER): Payer: Self-pay | Admitting: *Deleted

## 2012-03-06 ENCOUNTER — Emergency Department (HOSPITAL_BASED_OUTPATIENT_CLINIC_OR_DEPARTMENT_OTHER)
Admission: EM | Admit: 2012-03-06 | Discharge: 2012-03-07 | Disposition: A | Discharge status: Home / Self Care | Payer: Self-pay | Attending: Emergency Medicine | Admitting: Emergency Medicine

## 2012-03-06 DIAGNOSIS — I10 Essential (primary) hypertension: Secondary | ICD-10-CM | POA: Insufficient documentation

## 2012-03-06 DIAGNOSIS — Z91018 Allergy to other foods: Secondary | ICD-10-CM | POA: Insufficient documentation

## 2012-03-06 DIAGNOSIS — Z888 Allergy status to other drugs, medicaments and biological substances status: Secondary | ICD-10-CM | POA: Insufficient documentation

## 2012-03-06 DIAGNOSIS — R1115 Cyclical vomiting syndrome unrelated to migraine: Secondary | ICD-10-CM | POA: Insufficient documentation

## 2012-03-06 DIAGNOSIS — Z760 Encounter for issue of repeat prescription: Secondary | ICD-10-CM | POA: Insufficient documentation

## 2012-03-06 DIAGNOSIS — Z91010 Allergy to peanuts: Secondary | ICD-10-CM | POA: Insufficient documentation

## 2012-03-06 MED ORDER — HYDROMORPHONE HCL PF 1 MG/ML IJ SOLN
1.0000 mg | Freq: Once | INTRAMUSCULAR | Status: AC
  Administered 2012-03-06: 1 mg via INTRAVENOUS
  Filled 2012-03-06: qty 1

## 2012-03-06 MED ORDER — SODIUM CHLORIDE 0.9 % IV BOLUS (SEPSIS)
1000.0000 mL | Freq: Once | INTRAVENOUS | Status: AC
  Administered 2012-03-06: 1000 mL via INTRAVENOUS

## 2012-03-06 MED ORDER — PROMETHAZINE HCL 25 MG/ML IJ SOLN
25.0000 mg | Freq: Once | INTRAMUSCULAR | Status: AC
  Administered 2012-03-06: 25 mg via INTRAVENOUS
  Filled 2012-03-06: qty 1

## 2012-03-06 NOTE — ED Notes (Signed)
Pt c/o vomiting x 2 days HX of same

## 2012-03-07 ENCOUNTER — Encounter (HOSPITAL_BASED_OUTPATIENT_CLINIC_OR_DEPARTMENT_OTHER): Payer: Self-pay | Admitting: Emergency Medicine

## 2012-03-07 LAB — URINALYSIS, ROUTINE W REFLEX MICROSCOPIC
Bilirubin Urine: NEGATIVE
Glucose, UA: NEGATIVE mg/dL
Hgb urine dipstick: NEGATIVE
Ketones, ur: 15 mg/dL — AB
Leukocytes, UA: NEGATIVE
Nitrite: NEGATIVE
Protein, ur: NEGATIVE mg/dL
Specific Gravity, Urine: 1.016 (ref 1.005–1.030)
Urobilinogen, UA: 0.2 mg/dL (ref 0.0–1.0)
pH: 8 (ref 5.0–8.0)

## 2012-03-07 LAB — PREGNANCY, URINE: Preg Test, Ur: NEGATIVE

## 2012-03-07 MED ORDER — PROMETHAZINE HCL 25 MG PO TABS: ORAL_TABLET | ORAL | Status: DC

## 2012-03-07 NOTE — ED Notes (Signed)
MD at bedside. 

## 2012-03-07 NOTE — ED Provider Notes (Signed)
History     CSN: [id number]  Arrival date & time 03/06/12  2240   First MD Initiated Contact with Patient 03/07/12 [phone]      Chief Complaint  Patient presents with  . Vomiting     (Consider location/radiation/quality/duration/timing/severity/associated sxs/prior treatment) HPI This is a 26 year old black female with history of cyclic vomiting syndrome. She is here with nausea and vomiting since about 30 hours ago. It is associated by abdominal pain. The pain is now described as cramping. The symptoms are moderate to severe. She is out of Phenergan. She was given IV fluids, Phenergan and Dilaudid IV prior to my evaluation with significant relief of her symptoms. She states she is ready to go home and needs a refill of her Phenergan.  Past Medical History  Diagnosis Date  . Hypertension   . Cyclical vomiting   . CVS disease     Past Surgical History  Procedure Date  . Tonsillectomy   . Tubal ligation   . Cesarean section   . Cholecystectomy   . Cesarean section   . Tubal ligation     History reviewed. No pertinent family history.  History  Substance Use Topics  . Smoking status: Never Smoker   . Smokeless tobacco: Never Used  . Alcohol Use: No    OB History    Grav Para Term Preterm Abortions TAB SAB Ect Mult Living                  Review of Systems  All other systems reviewed and are negative.    Allergies  Other; Peanut-containing drug products; Percocet; Tomato; and Zofran  Home Medications   Current Outpatient Rx  Name Route Sig Dispense Refill  . FLINTSTONES COMPLETE 60 MG PO CHEW Oral Chew 1 tablet by mouth daily.      Marland Kitchen LABETALOL HCL 100 MG PO TABS Oral Take 100 mg by mouth 2 (two) times daily.      Marland Kitchen LORAZEPAM 2 MG PO TABS Oral Take 2 mg by mouth at bedtime as needed. For sleep    . PROMETHAZINE HCL 25 MG RE SUPP Rectal Place 1 suppository (25 mg total) rectally every 6 (six) hours as needed for nausea. 12 each 0  . PROMETHAZINE HCL 25 MG RE  SUPP Rectal Place 1 suppository (25 mg total) rectally every 6 (six) hours as needed for nausea. 12 each 0  . PROMETHAZINE HCL 25 MG RE SUPP Rectal Place 1 suppository (25 mg total) rectally every 6 (six) hours as needed for nausea. 20 each 0  . PROMETHAZINE HCL 25 MG RE SUPP Rectal Place 25 mg rectally every 6 (six) hours as needed. For nausea    . PROMETHAZINE HCL 25 MG PO TABS Oral Take 1 tablet (25 mg total) by mouth every 6 (six) hours as needed for nausea. 30 tablet 0  . PROMETHAZINE HCL 25 MG PO TABS Oral Take 1 tablet (25 mg total) by mouth every 6 (six) hours as needed for nausea. 15 tablet 0  . PROMETHAZINE HCL 25 MG PO TABS Oral Take 1 tablet (25 mg total) by mouth every 6 (six) hours as needed for nausea. 20 tablet 0  . PROMETHAZINE HCL 25 MG PO TABS Oral Take 25 mg by mouth every 6 (six) hours as needed. For nausea      BP 134/99  Pulse 100  Temp 98.7 F (37.1 C) (Oral)  Resp 16  Ht 5' 3"  (1.6 m)  Wt 144 lb (65.318 kg)  BMI 25.51 kg/m2  SpO2 98%  LMP 02/28/2012  Physical Exam General: Well-developed, well-nourished female in no acute distress; appearance consistent with age of record HENT: normocephalic, atraumatic; mottled dental enamel Eyes: pupils equal round and reactive to light; extraocular muscles intact Neck: supple Heart: regular rate and rhythm Lungs: clear to auscultation bilaterally Abdomen: soft; nondistended; mild right-sided abdominal tenderness bowel sounds present Extremities: No deformity; full range of motion; pulses normal Neurologic: Awake, alert and oriented; motor function intact in all extremities and symmetric; no facial droop Skin: Warm and dry Psychiatric: Normal mood and affect    ED Course  Procedures (including critical care time)     MDM   Nursing notes and vitals signs, including pulse oximetry, reviewed.  Summary of this visit's results, reviewed by myself:  Labs:  Results for orders placed during the hospital encounter of  03/06/12  URINALYSIS, ROUTINE W REFLEX MICROSCOPIC      Component Value Range   Color, Urine YELLOW  YELLOW   APPearance HAZY (*) CLEAR   Specific Gravity, Urine 1.016  1.005 - 1.030   pH 8.0  5.0 - 8.0   Glucose, UA NEGATIVE  NEGATIVE mg/dL   Hgb urine dipstick NEGATIVE  NEGATIVE   Bilirubin Urine NEGATIVE  NEGATIVE   Ketones, ur 15 (*) NEGATIVE mg/dL   Protein, ur NEGATIVE  NEGATIVE mg/dL   Urobilinogen, UA 0.2  0.0 - 1.0 mg/dL   Nitrite NEGATIVE  NEGATIVE   Leukocytes, UA NEGATIVE  NEGATIVE  PREGNANCY, URINE      Component Value Range   Preg Test, Ur NEGATIVE  NEGATIVE            Wynetta Fines, MD 03/07/12 [phone]

## 2012-03-10 ENCOUNTER — Encounter (HOSPITAL_BASED_OUTPATIENT_CLINIC_OR_DEPARTMENT_OTHER): Payer: Self-pay

## 2012-03-10 ENCOUNTER — Emergency Department (HOSPITAL_BASED_OUTPATIENT_CLINIC_OR_DEPARTMENT_OTHER)
Admission: EM | Admit: 2012-03-10 | Discharge: 2012-03-10 | Disposition: A | Discharge status: Home / Self Care | Payer: Self-pay | Attending: Emergency Medicine | Admitting: Emergency Medicine

## 2012-03-10 DIAGNOSIS — I10 Essential (primary) hypertension: Secondary | ICD-10-CM | POA: Insufficient documentation

## 2012-03-10 DIAGNOSIS — R1115 Cyclical vomiting syndrome unrelated to migraine: Secondary | ICD-10-CM | POA: Insufficient documentation

## 2012-03-10 DIAGNOSIS — Z9089 Acquired absence of other organs: Secondary | ICD-10-CM | POA: Insufficient documentation

## 2012-03-10 DIAGNOSIS — R1110 Vomiting, unspecified: Secondary | ICD-10-CM

## 2012-03-10 DIAGNOSIS — Z91010 Allergy to peanuts: Secondary | ICD-10-CM | POA: Insufficient documentation

## 2012-03-10 LAB — CBC WITH DIFFERENTIAL/PLATELET
Basophils Absolute: 0 10*3/uL (ref 0.0–0.1)
Basophils Relative: 0 % (ref 0–1)
Eosinophils Absolute: 0.1 10*3/uL (ref 0.0–0.7)
HCT: 39.8 % (ref 36.0–46.0)
MCH: 30.8 pg (ref 26.0–34.0)
MCHC: 33.9 g/dL (ref 30.0–36.0)
Monocytes Absolute: 0.8 10*3/uL (ref 0.1–1.0)
Monocytes Relative: 7 % (ref 3–12)
Neutro Abs: 7.7 10*3/uL (ref 1.7–7.7)
Neutrophils Relative %: 72 % (ref 43–77)
RDW: 12.3 % (ref 11.5–15.5)

## 2012-03-10 LAB — COMPREHENSIVE METABOLIC PANEL
AST: 15 U/L (ref 0–37)
Albumin: 4.3 g/dL (ref 3.5–5.2)
BUN: 12 mg/dL (ref 6–23)
Chloride: 101 mEq/L (ref 96–112)
Creatinine, Ser: 0.9 mg/dL (ref 0.50–1.10)
Total Bilirubin: 0.4 mg/dL (ref 0.3–1.2)
Total Protein: 7.7 g/dL (ref 6.0–8.3)

## 2012-03-10 LAB — URINALYSIS, ROUTINE W REFLEX MICROSCOPIC
Glucose, UA: NEGATIVE mg/dL
Hgb urine dipstick: NEGATIVE
Leukocytes, UA: NEGATIVE
Specific Gravity, Urine: 1.012 (ref 1.005–1.030)
pH: 8 (ref 5.0–8.0)

## 2012-03-10 LAB — PREGNANCY, URINE: Preg Test, Ur: NEGATIVE

## 2012-03-10 MED ORDER — SODIUM CHLORIDE 0.9 % IV BOLUS (SEPSIS)
1000.0000 mL | Freq: Once | INTRAVENOUS | Status: AC
  Administered 2012-03-10: 1000 mL via INTRAVENOUS

## 2012-03-10 MED ORDER — PROMETHAZINE HCL 25 MG/ML IJ SOLN
12.5000 mg | Freq: Once | INTRAMUSCULAR | Status: AC
  Administered 2012-03-10: 12.5 mg via INTRAVENOUS
  Filled 2012-03-10: qty 1

## 2012-03-10 MED ORDER — HYDROMORPHONE HCL PF 1 MG/ML IJ SOLN
1.0000 mg | Freq: Once | INTRAMUSCULAR | Status: AC
  Administered 2012-03-10: 1 mg via INTRAVENOUS
  Filled 2012-03-10: qty 1

## 2012-03-10 NOTE — ED Provider Notes (Signed)
History     CSN: [id number]  Arrival date & time 03/10/12  9562   First MD Initiated Contact with Patient 03/10/12 [phone]      Chief Complaint  Patient presents with  . Emesis    (Consider location/radiation/quality/duration/timing/severity/associated sxs/prior treatment) HPI Comments: Patient presents with nausea vomiting and diarrhea. She states that she has cyclic vomiting syndrome. She normally sees [facility] for this problem. She's prescribed a Lidoderm Phenergan at home. She states that it flares up frequently and that she's currently having a flareup. She states the vomiting started yesterday. It's been getting worse since yesterday. She has some crampy diffuse abdominal pain. She's taking Phenergan and her daughter but hasn't been able to keep them down. She denies any blood in her emesis or stool. Denies any fevers. Denies any unusual symptoms. She states this is similar to her past episodes of the cyclic vomiting syndrome.  Patient is a 26 y.o. female presenting with vomiting. The history is provided by the patient.  Emesis  Associated symptoms include abdominal pain and diarrhea. Pertinent negatives include no arthralgias, no chills, no cough, no fever and no headaches.    Past Medical History  Diagnosis Date  . Hypertension   . Cyclical vomiting     Past Surgical History  Procedure Date  . Tonsillectomy   . Tubal ligation   . Cesarean section   . Cholecystectomy   . Cesarean section   . Tubal ligation     No family history on file.  History  Substance Use Topics  . Smoking status: Never Smoker   . Smokeless tobacco: Never Used  . Alcohol Use: No    OB History    Grav Para Term Preterm Abortions TAB SAB Ect Mult Living                  Review of Systems  Constitutional: Negative for fever, chills, diaphoresis and fatigue.  HENT: Negative for congestion, rhinorrhea and sneezing.   Eyes: Negative.   Respiratory: Negative for cough, chest  tightness and shortness of breath.   Cardiovascular: Negative for chest pain and leg swelling.  Gastrointestinal: Positive for nausea, vomiting, abdominal pain and diarrhea. Negative for blood in stool.  Genitourinary: Negative for frequency, hematuria, flank pain and difficulty urinating.  Musculoskeletal: Negative for back pain and arthralgias.  Skin: Negative for rash.  Neurological: Negative for dizziness, speech difficulty, weakness, numbness and headaches.    Allergies  Other; Peanut-containing drug products; Percocet; Tomato; and Zofran  Home Medications   Current Outpatient Rx  Name Route Sig Dispense Refill  . FLINTSTONES COMPLETE 60 MG PO CHEW Oral Chew 1 tablet by mouth daily.      Marland Kitchen LABETALOL HCL 100 MG PO TABS Oral Take 100 mg by mouth 2 (two) times daily.      Marland Kitchen LORAZEPAM 2 MG PO TABS Oral Take 2 mg by mouth at bedtime as needed. For sleep    . PROMETHAZINE HCL 25 MG RE SUPP Rectal Place 1 suppository (25 mg total) rectally every 6 (six) hours as needed for nausea. 12 each 0  . PROMETHAZINE HCL 25 MG RE SUPP Rectal Place 1 suppository (25 mg total) rectally every 6 (six) hours as needed for nausea. 12 each 0  . PROMETHAZINE HCL 25 MG RE SUPP Rectal Place 1 suppository (25 mg total) rectally every 6 (six) hours as needed for nausea. 20 each 0  . PROMETHAZINE HCL 25 MG RE SUPP Rectal Place 25 mg rectally every 6 (  six) hours as needed. For nausea    . PROMETHAZINE HCL 25 MG PO TABS Oral Take 1 tablet (25 mg total) by mouth every 6 (six) hours as needed for nausea. 30 tablet 0  . PROMETHAZINE HCL 25 MG PO TABS Oral Take 1 tablet (25 mg total) by mouth every 6 (six) hours as needed for nausea. 15 tablet 0  . PROMETHAZINE HCL 25 MG PO TABS Oral Take 1 tablet (25 mg total) by mouth every 6 (six) hours as needed for nausea. 20 tablet 0  . PROMETHAZINE HCL 25 MG PO TABS  Take 1 tablet every 6 hours as needed for nausea and vomiting. 30 tablet 0    BP 175/117  Pulse 101  Temp 98.5  F (36.9 C)  Resp 22  SpO2 100%  LMP 02/28/2012  Physical Exam  Constitutional: She is oriented to person, place, and time. She appears well-developed and well-nourished.  HENT:  Head: Normocephalic and atraumatic.  Eyes: Pupils are equal, round, and reactive to light.  Neck: Normal range of motion. Neck supple.  Cardiovascular: Normal rate, regular rhythm and normal heart sounds.   Pulmonary/Chest: Effort normal and breath sounds normal. No respiratory distress. She has no wheezes. She has no rales. She exhibits no tenderness.  Abdominal: Soft. Bowel sounds are normal. There is tenderness (Mild diffuse tenderness). There is no rebound and no guarding.  Musculoskeletal: Normal range of motion. She exhibits no edema.  Lymphadenopathy:    She has no cervical adenopathy.  Neurological: She is alert and oriented to person, place, and time.  Skin: Skin is warm and dry. No rash noted.  Psychiatric: She has a normal mood and affect.    ED Course  Procedures (including critical care time)  Results for orders placed during the hospital encounter of 03/10/12  CBC WITH DIFFERENTIAL      Component Value Range   WBC 10.7 (*) 4.0 - 10.5 K/uL   RBC 4.39  3.87 - 5.11 MIL/uL   Hemoglobin 13.5  12.0 - 15.0 g/dL   HCT 39.8  36.0 - 46.0 %   MCV 90.7  78.0 - 100.0 fL   MCH 30.8  26.0 - 34.0 pg   MCHC 33.9  30.0 - 36.0 g/dL   RDW 12.3  11.5 - 15.5 %   Platelets 249  150 - 400 K/uL   Neutrophils Relative 72  43 - 77 %   Neutro Abs 7.7  1.7 - 7.7 K/uL   Lymphocytes Relative 20  12 - 46 %   Lymphs Abs 2.1  0.7 - 4.0 K/uL   Monocytes Relative 7  3 - 12 %   Monocytes Absolute 0.8  0.1 - 1.0 K/uL   Eosinophils Relative 1  0 - 5 %   Eosinophils Absolute 0.1  0.0 - 0.7 K/uL   Basophils Relative 0  0 - 1 %   Basophils Absolute 0.0  0.0 - 0.1 K/uL  COMPREHENSIVE METABOLIC PANEL      Component Value Range   Sodium 139  135 - 145 mEq/L   Potassium 3.4 (*) 3.5 - 5.1 mEq/L   Chloride 101  96 - 112  mEq/L   CO2 27  19 - 32 mEq/L   Glucose, Bld 112 (*) 70 - 99 mg/dL   BUN 12  6 - 23 mg/dL   Creatinine, Ser 0.90  0.50 - 1.10 mg/dL   Calcium 9.5  8.4 - 10.5 mg/dL   Total Protein 7.7  6.0 - 8.3 g/dL  Albumin 4.3  3.5 - 5.2 g/dL   AST 15  0 - 37 U/L   ALT 9  0 - 35 U/L   Alkaline Phosphatase 80  39 - 117 U/L   Total Bilirubin 0.4  0.3 - 1.2 mg/dL   GFR calc non Af Amer 87 (*) >90 mL/min   GFR calc Af Amer >90  >90 mL/min  URINALYSIS, ROUTINE W REFLEX MICROSCOPIC      Component Value Range   Color, Urine YELLOW  YELLOW   APPearance CLOUDY (*) CLEAR   Specific Gravity, Urine 1.012  1.005 - 1.030   pH 8.0  5.0 - 8.0   Glucose, UA NEGATIVE  NEGATIVE mg/dL   Hgb urine dipstick NEGATIVE  NEGATIVE   Bilirubin Urine NEGATIVE  NEGATIVE   Ketones, ur NEGATIVE  NEGATIVE mg/dL   Protein, ur NEGATIVE  NEGATIVE mg/dL   Urobilinogen, UA 0.2  0.0 - 1.0 mg/dL   Nitrite NEGATIVE  NEGATIVE   Leukocytes, UA NEGATIVE  NEGATIVE  PREGNANCY, URINE      Component Value Range   Preg Test, Ur NEGATIVE  NEGATIVE   No results found.    1. Vomiting       MDM  Pt feeling better after dilaudid and phenergan.  Symptoms consistent with her past cyclic vomiting syndrome        Malvin Johns, MD 03/10/12 1041

## 2012-03-10 NOTE — ED Notes (Signed)
Patient arrived by EMS with complaint of vomiting since yesterday. Patient reports abdominal pain and cramping with same. Patient states that she has cyclic vomiting and has this every few days that she needs shot of phenergan to relieve.

## 2012-03-11 ENCOUNTER — Emergency Department (HOSPITAL_BASED_OUTPATIENT_CLINIC_OR_DEPARTMENT_OTHER)
Admission: EM | Admit: 2012-03-11 | Discharge: 2012-03-11 | Disposition: A | Discharge status: Home / Self Care | Payer: Self-pay | Attending: Emergency Medicine | Admitting: Emergency Medicine

## 2012-03-11 DIAGNOSIS — Z79899 Other long term (current) drug therapy: Secondary | ICD-10-CM | POA: Insufficient documentation

## 2012-03-11 DIAGNOSIS — R1013 Epigastric pain: Secondary | ICD-10-CM | POA: Insufficient documentation

## 2012-03-11 DIAGNOSIS — R1115 Cyclical vomiting syndrome unrelated to migraine: Secondary | ICD-10-CM | POA: Insufficient documentation

## 2012-03-11 MED ORDER — HYDROMORPHONE HCL PF 2 MG/ML IJ SOLN
2.0000 mg | Freq: Once | INTRAMUSCULAR | Status: AC
  Administered 2012-03-11: 2 mg via INTRAVENOUS
  Filled 2012-03-11: qty 1

## 2012-03-11 MED ORDER — SODIUM CHLORIDE 0.9 % IV SOLN
INTRAVENOUS | Status: DC
  Administered 2012-03-11: 20:00:00 via INTRAVENOUS

## 2012-03-11 MED ORDER — SODIUM CHLORIDE 0.9 % IV SOLN
INTRAVENOUS | Status: DC
  Administered 2012-03-11: 18:00:00 via INTRAVENOUS

## 2012-03-11 MED ORDER — PROMETHAZINE HCL 25 MG/ML IJ SOLN
25.0000 mg | Freq: Once | INTRAMUSCULAR | Status: AC
  Administered 2012-03-11: 25 mg via INTRAVENOUS
  Filled 2012-03-11: qty 1

## 2012-03-11 NOTE — ED Notes (Signed)
Pt. Was seen here yesterday for same.

## 2012-03-11 NOTE — ED Notes (Signed)
MD at bedside. 

## 2012-03-11 NOTE — ED Notes (Signed)
3rd liter bag of NS bolus started per order.

## 2012-03-11 NOTE — ED Notes (Signed)
2nd liter bolus started. Call bell within reach.

## 2012-03-11 NOTE — ED Provider Notes (Signed)
History  This chart was scribed for Mylinda Latina III, MD by Jenne Campus. This patient was seen in room MH03/MH03 and the patient's care was started at 6:07PM.  CSN: [id number]  Arrival date & time 03/11/12  1713   First MD Initiated Contact with Patient 03/11/12 1807      Chief Complaint  Patient presents with  . Emesis    and Pt. reports diarrhea when asked     The history is provided by the patient. No language interpreter was used.    Sherry Key is a 26 y.o. female brought in by ambulance, who presents to the Emergency Department complaining of cyclic emesis with associated pre-emesis cough, CP, epigastric abdominal pain that radiates into the mid back and diarrhea. She states that she has a h/o cyclic emesis for the past 15 years and reports that episodes occur once every month. She denies any known triggers. She reports that she took Phenergan and Dilaudid without relief. She states that when the symptoms are not improved with the medications she comes for evaluation to get IV fluids and medications. She reports one episode of syncope today. She was seen in this ED yesterday and discharged home with improved symptoms after receiving IV fluids and medications. She denies fever, otalgia, sore throat, rash and urinary symptoms as associated symptoms. She has a h/o HTN and is on a beta blocker. She denies smoking and alcohol use. Her LNMP was 7 days ago.   She has been seen in this ED multiple times for similar symptoms.   PCP [facility] with [facility]  Past Medical History  Diagnosis Date  . Hypertension   . Cyclical vomiting     Past Surgical History  Procedure Date  . Tonsillectomy   . Tubal ligation   . Cesarean section   . Cholecystectomy   . Cesarean section   . Tubal ligation     No family history on file.  History  Substance Use Topics  . Smoking status: Never Smoker   . Smokeless tobacco: Never Used  . Alcohol Use: No    No OB  history provided.  Review of Systems  A complete 10 system review of systems was obtained and all systems are negative except as noted in the HPI and PMH.    Allergies  Other; Peanut-containing drug products; Percocet; Tomato; and Zofran  Home Medications   Current Outpatient Rx  Name Route Sig Dispense Refill  . FLINTSTONES COMPLETE 60 MG PO CHEW Oral Chew 1 tablet by mouth daily.      Marland Kitchen LABETALOL HCL 100 MG PO TABS Oral Take 100 mg by mouth 2 (two) times daily.      Marland Kitchen LORAZEPAM 2 MG PO TABS Oral Take 2 mg by mouth at bedtime as needed. For sleep    . PROMETHAZINE HCL 25 MG PO TABS  Take 1 tablet every 6 hours as needed for nausea and vomiting. 30 tablet 0  . PROMETHAZINE HCL 25 MG RE SUPP Rectal Place 1 suppository (25 mg total) rectally every 6 (six) hours as needed for nausea. 12 each 0  . PROMETHAZINE HCL 25 MG RE SUPP Rectal Place 1 suppository (25 mg total) rectally every 6 (six) hours as needed for nausea. 12 each 0  . PROMETHAZINE HCL 25 MG RE SUPP Rectal Place 1 suppository (25 mg total) rectally every 6 (six) hours as needed for nausea. 20 each 0  . PROMETHAZINE HCL 25 MG RE SUPP Rectal Place 25 mg rectally  every 6 (six) hours as needed. For nausea    . PROMETHAZINE HCL 25 MG PO TABS Oral Take 1 tablet (25 mg total) by mouth every 6 (six) hours as needed for nausea. 30 tablet 0  . PROMETHAZINE HCL 25 MG PO TABS Oral Take 1 tablet (25 mg total) by mouth every 6 (six) hours as needed for nausea. 15 tablet 0  . PROMETHAZINE HCL 25 MG PO TABS Oral Take 1 tablet (25 mg total) by mouth every 6 (six) hours as needed for nausea. 20 tablet 0    Triage Vitals: BP 176/93  Pulse 100  Temp 99.1 F (37.3 C) (Oral)  Resp 14  Ht 5' 7"  (1.702 m)  Wt 144 lb (65.318 kg)  BMI 22.55 kg/m2  SpO2 100%  LMP 02/28/2012  Physical Exam  Nursing note and vitals reviewed. Constitutional: She is oriented to person, place, and time. She appears well-developed and well-nourished. She appears  distressed.  HENT:  Head: Normocephalic and atraumatic.  Eyes: Conjunctivae and EOM are normal. Pupils are equal, round, and reactive to light.  Neck: Neck supple. No tracheal deviation present.  Cardiovascular: Normal rate and regular rhythm.   Pulmonary/Chest: Effort normal and breath sounds normal. No respiratory distress.       Deep gasping respirations  Abdominal: Soft. Bowel sounds are normal. There is no tenderness.  Musculoskeletal: Normal range of motion.  Neurological: She is alert and oriented to person, place, and time.  Skin: Skin is warm and dry.  Psychiatric: She has a normal mood and affect. Her behavior is normal.    ED Course  Procedures (including critical care time)  DIAGNOSTIC STUDIES: Oxygen Saturation is 100% on room air, normal by my interpretation.    COORDINATION OF CARE: 6:15PM-Discussed treatment plan which includes IV medications and fluids with pt at bedside and pt agreed to plan. Advised pt that no further testing needs to be performed due to the fact that she was seen here yesterday and has an extensive negative work up.  7:14PM-Pt rechecked and is feeling mildly improved. Will order another bag of IV fluid.  8:22PM-Pt rechecked and is resting comfortably. She reports that her symptoms have improved since my last recheck. No recent emesis episodes. Will order another bag of IV fluids.  9:04 PM Pt feels better, is ready to go home.  1. Cyclic vomiting syndrome      I personally performed the services described in this documentation, which was scribed in my presence. The recorded information has been reviewed and considered.  Katy Apo, MD         Mylinda Latina III, MD 03/11/12 2120

## 2012-03-14 ENCOUNTER — Encounter (HOSPITAL_BASED_OUTPATIENT_CLINIC_OR_DEPARTMENT_OTHER): Payer: Self-pay | Admitting: *Deleted

## 2012-03-14 ENCOUNTER — Emergency Department (HOSPITAL_BASED_OUTPATIENT_CLINIC_OR_DEPARTMENT_OTHER)
Admission: EM | Admit: 2012-03-14 | Discharge: 2012-03-14 | Disposition: A | Discharge status: Home / Self Care | Payer: Self-pay | Attending: Emergency Medicine | Admitting: Emergency Medicine

## 2012-03-14 DIAGNOSIS — R1115 Cyclical vomiting syndrome unrelated to migraine: Secondary | ICD-10-CM | POA: Insufficient documentation

## 2012-03-14 DIAGNOSIS — Z79899 Other long term (current) drug therapy: Secondary | ICD-10-CM | POA: Insufficient documentation

## 2012-03-14 DIAGNOSIS — R109 Unspecified abdominal pain: Secondary | ICD-10-CM | POA: Insufficient documentation

## 2012-03-14 DIAGNOSIS — I10 Essential (primary) hypertension: Secondary | ICD-10-CM | POA: Insufficient documentation

## 2012-03-14 LAB — URINALYSIS, ROUTINE W REFLEX MICROSCOPIC
Bilirubin Urine: NEGATIVE
Glucose, UA: NEGATIVE mg/dL
Specific Gravity, Urine: 1.011 (ref 1.005–1.030)
Urobilinogen, UA: 0.2 mg/dL (ref 0.0–1.0)
pH: 7.5 (ref 5.0–8.0)

## 2012-03-14 LAB — URINE MICROSCOPIC-ADD ON

## 2012-03-14 LAB — PREGNANCY, URINE: Preg Test, Ur: NEGATIVE

## 2012-03-14 MED ORDER — SODIUM CHLORIDE 0.9 % IV BOLUS (SEPSIS)
1000.0000 mL | Freq: Once | INTRAVENOUS | Status: AC
  Administered 2012-03-14: 1000 mL via INTRAVENOUS

## 2012-03-14 MED ORDER — HYDROMORPHONE HCL PF 1 MG/ML IJ SOLN
1.0000 mg | Freq: Once | INTRAMUSCULAR | Status: AC
  Administered 2012-03-14: 1 mg via INTRAVENOUS
  Filled 2012-03-14: qty 1

## 2012-03-14 MED ORDER — PROMETHAZINE HCL 25 MG RE SUPP: 25.0000 mg | Freq: Four times a day (QID) | RECTAL | Status: DC | PRN

## 2012-03-14 MED ORDER — PROMETHAZINE HCL 25 MG/ML IJ SOLN
25.0000 mg | Freq: Once | INTRAMUSCULAR | Status: AC
  Administered 2012-03-14: 25 mg via INTRAVENOUS
  Filled 2012-03-14: qty 1

## 2012-03-14 MED ORDER — PROMETHAZINE HCL 25 MG/ML IJ SOLN
12.5000 mg | Freq: Once | INTRAMUSCULAR | Status: AC
  Administered 2012-03-14: 12.5 mg via INTRAVENOUS
  Filled 2012-03-14: qty 1

## 2012-03-14 NOTE — ED Notes (Signed)
Seen here multiple times for same. Pt here by EMS for diffuse abdominal pains, and multiple episodes of vomiting and diarrhea x 1day. CBG 96. Hypertensive en route. IV 20g RAC

## 2012-03-14 NOTE — ED Notes (Signed)
Pt states she feels well enough for discharge. Will call grandmother to pick her up.

## 2012-03-14 NOTE — ED Provider Notes (Signed)
History     CSN: [id number]  Arrival date & time 03/14/12  0219   None     Chief Complaint  Patient presents with  . Abdominal Pain    (Consider location/radiation/quality/duration/timing/severity/associated sxs/prior treatment) HPI Comments: Patient with history of cyclic vomiting syndrome.  Comes to the ED frequent for flareups.  This is the third time this week.  Usually get dilaudid and phenergan and feels better.  Denies fevers or chills.  No urinary complaints.    Patient is a 26 y.o. female presenting with abdominal pain. The history is provided by the patient.  Abdominal Pain The primary symptoms of the illness include abdominal pain, nausea and vomiting. The primary symptoms of the illness do not include fever, diarrhea or dysuria. Episode onset: last night. The onset of the illness was sudden. The problem has been rapidly worsening.    Past Medical History  Diagnosis Date  . Hypertension   . Cyclical vomiting     Past Surgical History  Procedure Date  . Tonsillectomy   . Tubal ligation   . Cesarean section   . Cholecystectomy   . Cesarean section   . Tubal ligation     No family history on file.  History  Substance Use Topics  . Smoking status: Never Smoker   . Smokeless tobacco: Never Used  . Alcohol Use: No    OB History    Grav Para Term Preterm Abortions TAB SAB Ect Mult Living                  Review of Systems  Constitutional: Negative for fever.  Gastrointestinal: Positive for nausea, vomiting and abdominal pain. Negative for diarrhea.  Genitourinary: Negative for dysuria.  All other systems reviewed and are negative.    Allergies  Other; Peanut-containing drug products; Percocet; Tomato; and Zofran  Home Medications   Current Outpatient Rx  Name Route Sig Dispense Refill  . FLINTSTONES COMPLETE 60 MG PO CHEW Oral Chew 1 tablet by mouth daily.      Marland Kitchen LABETALOL HCL 100 MG PO TABS Oral Take 100 mg by mouth 2 (two) times daily.      Marland Kitchen  LORAZEPAM 2 MG PO TABS Oral Take 2 mg by mouth at bedtime as needed. For sleep    . PROMETHAZINE HCL 25 MG RE SUPP Rectal Place 1 suppository (25 mg total) rectally every 6 (six) hours as needed for nausea. 12 each 0  . PROMETHAZINE HCL 25 MG RE SUPP Rectal Place 1 suppository (25 mg total) rectally every 6 (six) hours as needed for nausea. 12 each 0  . PROMETHAZINE HCL 25 MG RE SUPP Rectal Place 1 suppository (25 mg total) rectally every 6 (six) hours as needed for nausea. 20 each 0  . PROMETHAZINE HCL 25 MG RE SUPP Rectal Place 25 mg rectally every 6 (six) hours as needed. For nausea    . PROMETHAZINE HCL 25 MG PO TABS Oral Take 1 tablet (25 mg total) by mouth every 6 (six) hours as needed for nausea. 30 tablet 0  . PROMETHAZINE HCL 25 MG PO TABS Oral Take 1 tablet (25 mg total) by mouth every 6 (six) hours as needed for nausea. 15 tablet 0  . PROMETHAZINE HCL 25 MG PO TABS Oral Take 1 tablet (25 mg total) by mouth every 6 (six) hours as needed for nausea. 20 tablet 0  . PROMETHAZINE HCL 25 MG PO TABS  Take 1 tablet every 6 hours as needed for nausea and  vomiting. 30 tablet 0    BP 184/124  Pulse 104  Temp 98.6 F (37 C) (Oral)  Resp 24  Ht 5' 3"  (1.6 m)  Wt 145 lb (65.772 kg)  BMI 25.69 kg/m2  SpO2 100%  LMP 02/28/2012  Physical Exam  Nursing note and vitals reviewed. Constitutional: She is oriented to person, place, and time. She appears well-developed and well-nourished. No distress.       Patient is actively vomiting  HENT:  Head: Normocephalic and atraumatic.  Neck: Normal range of motion. Neck supple.  Cardiovascular: Normal rate and regular rhythm.  Exam reveals no gallop and no friction rub.   No murmur heard. Pulmonary/Chest: Effort normal and breath sounds normal. No respiratory distress. She has no wheezes.  Abdominal: Soft. Bowel sounds are normal. She exhibits no distension. There is no tenderness.  Musculoskeletal: Normal range of motion.  Neurological: She is alert  and oriented to person, place, and time.  Skin: Skin is warm and dry. She is not diaphoretic.    ED Course  Procedures (including critical care time)  Labs Reviewed - No data to display No results found.   No diagnosis found.    MDM  The patient is now feeling better with meds and fluids.  She is not vomiting and has been able to keep her fluids down.  She appears to be feeling better and will be discharged to home.  To return prn.          Veryl Speak, MD 03/14/12 [phone]

## 2012-03-14 NOTE — ED Notes (Signed)
Pt denies any nausea or pain at this time. Given ice chips for PO challenge. Will reassess shortly.

## 2012-03-14 NOTE — ED Notes (Signed)
Pt ambulatory to bedside commode to urinate. No diarrhea noted.

## 2012-03-14 NOTE — ED Notes (Signed)
Unable to obtain labs at this time. Will redraw shortly.

## 2012-03-15 ENCOUNTER — Encounter (HOSPITAL_BASED_OUTPATIENT_CLINIC_OR_DEPARTMENT_OTHER): Payer: Self-pay | Admitting: *Deleted

## 2012-03-15 ENCOUNTER — Emergency Department (HOSPITAL_BASED_OUTPATIENT_CLINIC_OR_DEPARTMENT_OTHER)
Admission: EM | Admit: 2012-03-15 | Discharge: 2012-03-15 | Disposition: A | Discharge status: Home / Self Care | Payer: Self-pay | Attending: Emergency Medicine | Admitting: Emergency Medicine

## 2012-03-15 DIAGNOSIS — Z91010 Allergy to peanuts: Secondary | ICD-10-CM | POA: Insufficient documentation

## 2012-03-15 DIAGNOSIS — R1115 Cyclical vomiting syndrome unrelated to migraine: Secondary | ICD-10-CM | POA: Insufficient documentation

## 2012-03-15 DIAGNOSIS — R1084 Generalized abdominal pain: Secondary | ICD-10-CM | POA: Insufficient documentation

## 2012-03-15 DIAGNOSIS — G8929 Other chronic pain: Secondary | ICD-10-CM

## 2012-03-15 DIAGNOSIS — I10 Essential (primary) hypertension: Secondary | ICD-10-CM | POA: Insufficient documentation

## 2012-03-15 LAB — BASIC METABOLIC PANEL
CO2: 26 mEq/L (ref 19–32)
Calcium: 9.3 mg/dL (ref 8.4–10.5)
Creatinine, Ser: 0.7 mg/dL (ref 0.50–1.10)
GFR calc non Af Amer: >90 mL/min (ref >90)
Sodium: 140 mEq/L (ref 135–145)

## 2012-03-15 LAB — MAGNESIUM: Magnesium: 1.9 mg/dL (ref 1.5–2.5)

## 2012-03-15 MED ORDER — PROCHLORPERAZINE 25 MG RE SUPP: 25.0000 mg | Freq: Two times a day (BID) | RECTAL | Status: DC | PRN

## 2012-03-15 MED ORDER — LACTATED RINGERS IV BOLUS (SEPSIS)
1000.0000 mL | Freq: Once | INTRAVENOUS | Status: AC
  Administered 2012-03-15: 1000 mL via INTRAVENOUS

## 2012-03-15 MED ORDER — HYDROMORPHONE HCL PF 1 MG/ML IJ SOLN
1.0000 mg | Freq: Once | INTRAMUSCULAR | Status: AC
  Administered 2012-03-15: 1 mg via INTRAVENOUS
  Filled 2012-03-15: qty 1

## 2012-03-15 MED ORDER — PROMETHAZINE HCL 25 MG/ML IJ SOLN
50.0000 mg | Freq: Once | INTRAMUSCULAR | Status: AC
  Administered 2012-03-15: 50 mg via INTRAVENOUS
  Filled 2012-03-15: qty 2

## 2012-03-15 MED ORDER — HYDROMORPHONE HCL PF 1 MG/ML IJ SOLN
0.5000 mg | Freq: Once | INTRAMUSCULAR | Status: AC
  Administered 2012-03-15: 0.5 mg via INTRAVENOUS
  Filled 2012-03-15: qty 1

## 2012-03-16 ENCOUNTER — Encounter (HOSPITAL_BASED_OUTPATIENT_CLINIC_OR_DEPARTMENT_OTHER): Payer: Self-pay | Admitting: *Deleted

## 2012-03-16 ENCOUNTER — Emergency Department (HOSPITAL_BASED_OUTPATIENT_CLINIC_OR_DEPARTMENT_OTHER)
Admission: EM | Admit: 2012-03-16 | Discharge: 2012-03-16 | Disposition: A | Discharge status: Home / Self Care | Payer: Self-pay | Attending: Emergency Medicine | Admitting: Emergency Medicine

## 2012-03-16 DIAGNOSIS — R1115 Cyclical vomiting syndrome unrelated to migraine: Secondary | ICD-10-CM | POA: Insufficient documentation

## 2012-03-16 DIAGNOSIS — Z91018 Allergy to other foods: Secondary | ICD-10-CM | POA: Insufficient documentation

## 2012-03-16 DIAGNOSIS — I10 Essential (primary) hypertension: Secondary | ICD-10-CM | POA: Insufficient documentation

## 2012-03-16 DIAGNOSIS — Z888 Allergy status to other drugs, medicaments and biological substances status: Secondary | ICD-10-CM | POA: Insufficient documentation

## 2012-03-16 LAB — PREGNANCY, URINE: Preg Test, Ur: NEGATIVE

## 2012-03-16 LAB — CBC WITH DIFFERENTIAL/PLATELET
Basophils Absolute: 0 10*3/uL (ref 0.0–0.1)
Basophils Relative: 0 % (ref 0–1)
Eosinophils Relative: 1 % (ref 0–5)
HCT: 39.5 % (ref 36.0–46.0)
MCHC: 34.2 g/dL (ref 30.0–36.0)
Monocytes Absolute: 0.7 10*3/uL (ref 0.1–1.0)
Neutro Abs: 10.4 10*3/uL — ABNORMAL HIGH (ref 1.7–7.7)
Platelets: 219 10*3/uL (ref 150–400)
RDW: 12.5 % (ref 11.5–15.5)

## 2012-03-16 LAB — URINALYSIS, ROUTINE W REFLEX MICROSCOPIC
Glucose, UA: NEGATIVE mg/dL
Leukocytes, UA: NEGATIVE
Specific Gravity, Urine: 1.01 (ref 1.005–1.030)
pH: 7.5 (ref 5.0–8.0)

## 2012-03-16 LAB — COMPREHENSIVE METABOLIC PANEL
AST: 17 U/L (ref 0–37)
Albumin: 4.2 g/dL (ref 3.5–5.2)
Calcium: 9.4 mg/dL (ref 8.4–10.5)
Chloride: 103 mEq/L (ref 96–112)
Creatinine, Ser: 0.7 mg/dL (ref 0.50–1.10)
Sodium: 139 mEq/L (ref 135–145)

## 2012-03-16 LAB — URINE MICROSCOPIC-ADD ON

## 2012-03-16 MED ORDER — SODIUM CHLORIDE 0.9 % IV BOLUS (SEPSIS)
1000.0000 mL | Freq: Once | INTRAVENOUS | Status: AC
  Administered 2012-03-16: 1000 mL via INTRAVENOUS

## 2012-03-16 MED ORDER — PROMETHAZINE HCL 25 MG/ML IJ SOLN
12.5000 mg | Freq: Once | INTRAMUSCULAR | Status: AC
  Administered 2012-03-16: 12.5 mg via INTRAVENOUS
  Filled 2012-03-16: qty 1

## 2012-03-16 MED ORDER — PROMETHAZINE HCL 25 MG PO TABS
25.0000 mg | ORAL_TABLET | Freq: Once | ORAL | Status: AC
  Administered 2012-03-16: 25 mg via ORAL
  Filled 2012-03-16: qty 1

## 2012-03-16 MED ORDER — HYDROMORPHONE HCL PF 1 MG/ML IJ SOLN
1.0000 mg | Freq: Once | INTRAMUSCULAR | Status: AC
  Administered 2012-03-16: 1 mg via INTRAVENOUS
  Filled 2012-03-16: qty 1

## 2012-03-16 MED ORDER — LABETALOL HCL 5 MG/ML IV SOLN
10.0000 mg | Freq: Once | INTRAVENOUS | Status: AC
  Administered 2012-03-16: 10 mg via INTRAVENOUS
  Filled 2012-03-16: qty 4

## 2012-03-16 NOTE — ED Notes (Signed)
Patient reports that she was here yesterday for n/v/d, still vomiting & unable to hold down any meds or use suppository due to diarrhea

## 2012-03-16 NOTE — ED Provider Notes (Signed)
History     CSN: [id number]  Arrival date & time 03/16/12  0900   First MD Initiated Contact with Patient 03/16/12 0900      Chief Complaint  Patient presents with  . Emesis  . Diarrhea    (Consider location/radiation/quality/duration/timing/severity/associated sxs/prior treatment) HPI Patient is a 82 roll female with history of cyclical vomiting syndrome who presents today for the fourth time for same since September 1. Patient was seen on September 1, September 5, September 6. Patient reports that she has not been able to take her blood pressure medicine for 3 days. Patient reports that she is followed by a gastroenterologist for [facility] for this though she cannot recall the name. She cannot be seen until Tuesday by them. Patient had suppositories for control of her symptoms at home as well as oral Dilaudid and Phenergan. She was unable to use her suppositories as she reported developing diarrhea. Patient was hypertensive yesterday at presentation as well. She denies any chest pain or shortness of breath. Per EMS patient did not vomit in route. She did not vomit during my evaluation. Patient denies fevers. She is status post cholecystectomy. Patient has history of cyclical vomiting syndrome beginning at the age of 89. Pregnancy test was negative several days ago. Kidney function was intact yesterday with no evidence of acute kidney infection. Patient reports 10 out of 10 abdominal pain that is sharp and diffuse. There is no radiation. Nothing has made her symptoms better or worse. There are no other associated or modifying factors. Past Medical History  Diagnosis Date  . Hypertension   . Cyclical vomiting     Past Surgical History  Procedure Date  . Tonsillectomy   . Tubal ligation   . Cesarean section   . Cholecystectomy   . Cesarean section   . Tubal ligation     No family history on file.  History  Substance Use Topics  . Smoking status: Never Smoker   . Smokeless  tobacco: Never Used  . Alcohol Use: No    OB History    Grav Para Term Preterm Abortions TAB SAB Ect Mult Living                  Review of Systems  Constitutional: Negative.   HENT: Negative.   Eyes: Negative.   Respiratory: Negative.   Cardiovascular: Negative.   Gastrointestinal: Positive for nausea, vomiting, abdominal pain and diarrhea.  Genitourinary: Negative.   Musculoskeletal: Negative.   Skin: Negative.   Neurological: Negative.   Hematological: Negative.   Psychiatric/Behavioral: Negative.   All other systems reviewed and are negative.    Allergies  Other; Peanut-containing drug products; Percocet; Tomato; and Zofran  Home Medications   Current Outpatient Rx  Name Route Sig Dispense Refill  . FLINTSTONES COMPLETE 60 MG PO CHEW Oral Chew 1 tablet by mouth daily.      Marland Kitchen LABETALOL HCL 100 MG PO TABS Oral Take 100 mg by mouth 2 (two) times daily.      Marland Kitchen LORAZEPAM 2 MG PO TABS Oral Take 2 mg by mouth at bedtime as needed. For sleep    . PROCHLORPERAZINE 25 MG RE SUPP Rectal Place 1 suppository (25 mg total) rectally every 12 (twelve) hours as needed for nausea. 12 suppository 0  . PROMETHAZINE HCL 25 MG RE SUPP Rectal Place 1 suppository (25 mg total) rectally every 6 (six) hours as needed for nausea. 12 each 0  . PROMETHAZINE HCL 25 MG RE SUPP Rectal Place 1  suppository (25 mg total) rectally every 6 (six) hours as needed for nausea. 12 each 0  . PROMETHAZINE HCL 25 MG RE SUPP Rectal Place 1 suppository (25 mg total) rectally every 6 (six) hours as needed for nausea. 20 each 0  . PROMETHAZINE HCL 25 MG RE SUPP Rectal Place 25 mg rectally every 6 (six) hours as needed. For nausea    . PROMETHAZINE HCL 25 MG RE SUPP Rectal Place 1 suppository (25 mg total) rectally every 6 (six) hours as needed for nausea. 12 each 2  . PROMETHAZINE HCL 25 MG PO TABS Oral Take 1 tablet (25 mg total) by mouth every 6 (six) hours as needed for nausea. 30 tablet 0  . PROMETHAZINE HCL 25  MG PO TABS Oral Take 1 tablet (25 mg total) by mouth every 6 (six) hours as needed for nausea. 15 tablet 0  . PROMETHAZINE HCL 25 MG PO TABS Oral Take 1 tablet (25 mg total) by mouth every 6 (six) hours as needed for nausea. 20 tablet 0  . PROMETHAZINE HCL 25 MG PO TABS  Take 1 tablet every 6 hours as needed for nausea and vomiting. 30 tablet 0    BP 177/125  Pulse 99  Temp 99.2 F (37.3 C) (Oral)  Resp 23  SpO2 100%  LMP 02/28/2012  Physical Exam  Nursing note and vitals reviewed. GEN: Well-developed, well-nourished female in no distress HEENT: Atraumatic, normocephalic. Oropharynx clear without erythema EYES: PERRLA BL, no scleral icterus. NECK: Trachea midline, no meningismus CV: regular rate and rhythm. No murmurs, rubs, or gallops PULM: No respiratory distress.  No crackles, wheezes, or rales. GI: soft, mild diffuse tenderness to palpation. No guarding, rebound. + bowel sounds  GU: deferred Neuro: cranial nerves grossly 2-12 intact, no abnormalities of strength or sensation, A and O x 3 MSK: Patient moves all 4 extremities symmetrically, no deformity, edema, or injury noted Skin: No rashes petechiae, purpura, or jaundice Psych: no abnormality of mood   ED Course  Procedures (including critical care time)  Labs Reviewed  CBC WITH DIFFERENTIAL - Abnormal; Notable for the following:    WBC 12.8 (*)     Neutrophils Relative 82 (*)     Neutro Abs 10.4 (*)     Lymphocytes Relative 11 (*)     All other components within normal limits  COMPREHENSIVE METABOLIC PANEL - Abnormal; Notable for the following:    Glucose, Bld 107 (*)     All other components within normal limits  URINALYSIS, ROUTINE W REFLEX MICROSCOPIC - Abnormal; Notable for the following:    APPearance CLOUDY (*)     Hgb urine dipstick TRACE (*)     All other components within normal limits  URINE MICROSCOPIC-ADD ON - Abnormal; Notable for the following:    Squamous Epithelial / LPF MANY (*)     Bacteria, UA  MANY (*)     All other components within normal limits  LIPASE, BLOOD  PREGNANCY, URINE   No results found.   1. Cyclical vomiting, not intractable       MDM  Patient was evaluated by myself. Based on evaluation patient did have laboratory workup repeated today. Yesterday patient had metabolic panel only. Patient has presented on 3 consecutive days for similar symptoms. She is already status post cholecystectomy. A ligature the patient has not developed acute kidney injury, pancreatitis, abnormalities of liver function, or significant ketosis in her urine. Patient was given a liter of IV fluids as well as a dose  of 1 mg Dilaudid IV and 12 kg of Phenergan IV. Thus far patient has not vomited here. Given patient's persistently elevated blood pressure I did write for a dose of labetalol 10 mg IV. We did not have by mouth formulation and patient is hypertensive as she was yesterday. Patient will be reevaluated following return of labs in administration for trauma medications.  11:23 AM Patient given an oral dose of promethazine which she tolerated well. Patient ready for discharge. Lab results showed no ketonuria, no renal compromise, no electrolyte abnormalities, no dehydration. Patient had improvement in her blood pressure with a dose of labetalol IV. Patient was told to take her oral meds as soon as she arrived issue at home. She has sufficient medications at home to control her symptoms at this time and did not require anything further. Patient was discharged in good condition.        Chauncy Passy, MD 03/16/12 1125

## 2012-03-18 NOTE — ED Provider Notes (Signed)
History     CSN: [id number]  Arrival date & time 03/15/12  5732   First MD Initiated Contact with Patient 03/15/12 1009      Chief Complaint  Patient presents with  . Emesis    (Consider location/radiation/quality/duration/timing/severity/associated sxs/prior treatment) HPI Sherry Key is a 26 y.o. female has cyclic vomiting syndrome and has been unable to keep her pain or anti-emetics down. This has been going on for days and she has been in this ER for help.  The symptoms have been constant, severe, not getting better,  She claims her rectal phenergan is not working either.  Past Medical History  Diagnosis Date  . Hypertension   . Cyclical vomiting     Past Surgical History  Procedure Date  . Tonsillectomy   . Tubal ligation   . Cesarean section   . Cholecystectomy   . Cesarean section   . Tubal ligation     History reviewed. No pertinent family history.  History  Substance Use Topics  . Smoking status: Never Smoker   . Smokeless tobacco: Never Used  . Alcohol Use: No    OB History    Grav Para Term Preterm Abortions TAB SAB Ect Mult Living                  Review of Systems At least 10pt or greater review of systems completed and are negative except where specified in the HPI.   Allergies  Other; Peanut-containing drug products; Percocet; Tomato; and Zofran  Home Medications   Current Outpatient Rx  Name Route Sig Dispense Refill  . FLINTSTONES COMPLETE 60 MG PO CHEW Oral Chew 1 tablet by mouth daily.      Marland Kitchen LABETALOL HCL 100 MG PO TABS Oral Take 100 mg by mouth 2 (two) times daily.      Marland Kitchen LORAZEPAM 2 MG PO TABS Oral Take 2 mg by mouth at bedtime as needed. For sleep    . PROCHLORPERAZINE 25 MG RE SUPP Rectal Place 1 suppository (25 mg total) rectally every 12 (twelve) hours as needed for nausea. 12 suppository 0  . PROMETHAZINE HCL 25 MG RE SUPP Rectal Place 1 suppository (25 mg total) rectally every 6 (six) hours as needed for nausea. 12 each 0    . PROMETHAZINE HCL 25 MG RE SUPP Rectal Place 1 suppository (25 mg total) rectally every 6 (six) hours as needed for nausea. 12 each 0  . PROMETHAZINE HCL 25 MG RE SUPP Rectal Place 1 suppository (25 mg total) rectally every 6 (six) hours as needed for nausea. 20 each 0  . PROMETHAZINE HCL 25 MG RE SUPP Rectal Place 25 mg rectally every 6 (six) hours as needed. For nausea    . PROMETHAZINE HCL 25 MG RE SUPP Rectal Place 1 suppository (25 mg total) rectally every 6 (six) hours as needed for nausea. 12 each 2  . PROMETHAZINE HCL 25 MG PO TABS Oral Take 1 tablet (25 mg total) by mouth every 6 (six) hours as needed for nausea. 30 tablet 0  . PROMETHAZINE HCL 25 MG PO TABS Oral Take 1 tablet (25 mg total) by mouth every 6 (six) hours as needed for nausea. 15 tablet 0  . PROMETHAZINE HCL 25 MG PO TABS Oral Take 1 tablet (25 mg total) by mouth every 6 (six) hours as needed for nausea. 20 tablet 0  . PROMETHAZINE HCL 25 MG PO TABS  Take 1 tablet every 6 hours as needed for nausea and vomiting. [place]  tablet 0    BP 194/137  Pulse 117  Temp 98.3 F (36.8 C) (Oral)  Resp 20  SpO2 100%  LMP 02/28/2012  Physical Exam  Nursing notes reviewed.  Electronic medical record reviewed. VITAL SIGNS:   Filed Vitals:   03/15/12 0959 03/15/12 1000 03/15/12 1135  BP: 196/111  194/137  Pulse: 107  117  Temp: 98.3 F (36.8 C)    TempSrc: Oral    Resp: 20    SpO2: 100% 100%    CONSTITUTIONAL: Awake, oriented, appears non-toxic but uncomfortable. HENT: Atraumatic, normocephalic, oral mucosa pink and moist, airway patent. Nares patent without drainage. External ears normal. EYES: Conjunctiva clear, EOMI, PERRLA NECK: Trachea midline, non-tender, supple CARDIOVASCULAR: Normal heart rate, Normal rhythm, No murmurs, rubs, gallops PULMONARY/CHEST: Clear to auscultation, no rhonchi, wheezes, or rales. Symmetrical breath sounds. Non-tender. ABDOMINAL: Non-distended, soft, non-tender - no rebound or guarding.  BS  normal. NEUROLOGIC: Non-focal, moving all four extremities, no gross sensory or motor deficits. EXTREMITIES: No clubbing, cyanosis, or edema SKIN: Warm, Dry, No erythema, No rash  ED Course  Procedures (including critical care time)  Labs Reviewed  BASIC METABOLIC PANEL - Abnormal; Notable for the following:    Glucose, Bld 109 (*)     All other components within normal limits  MAGNESIUM   No results found.   1. Cyclical vomiting, not intractable   2. Abdominal pain, chronic, generalized       MDM  Sherry Key is a 26 y.o. female with cyclic vomiting syndrome having frequent exacerbations.  Patient medicated and electrolytes checked.  Fluids given. Pt wishes to leave because she is feeling better and must pick up her child from school. I have given explicit precautions to return to the ER including worsening symptoms. The patient understands and accepts the medical plan as it's been dictated and I have answered their questions. Discharge instructions concerning home care and prescriptions have been given.  The patient is STABLE and is discharged to home in good condition.            Rhunette Croft, MD 03/18/12 2104

## 2012-03-19 LAB — URINE CULTURE

## 2012-04-13 ENCOUNTER — Encounter (HOSPITAL_BASED_OUTPATIENT_CLINIC_OR_DEPARTMENT_OTHER): Payer: Self-pay | Admitting: *Deleted

## 2012-04-13 ENCOUNTER — Emergency Department (HOSPITAL_BASED_OUTPATIENT_CLINIC_OR_DEPARTMENT_OTHER)
Admission: EM | Admit: 2012-04-13 | Discharge: 2012-04-13 | Disposition: A | Discharge status: Home / Self Care | Payer: Self-pay | Attending: Emergency Medicine | Admitting: Emergency Medicine

## 2012-04-13 DIAGNOSIS — Z9089 Acquired absence of other organs: Secondary | ICD-10-CM | POA: Insufficient documentation

## 2012-04-13 DIAGNOSIS — M549 Dorsalgia, unspecified: Secondary | ICD-10-CM | POA: Insufficient documentation

## 2012-04-13 DIAGNOSIS — I10 Essential (primary) hypertension: Secondary | ICD-10-CM | POA: Insufficient documentation

## 2012-04-13 DIAGNOSIS — R1115 Cyclical vomiting syndrome unrelated to migraine: Secondary | ICD-10-CM | POA: Insufficient documentation

## 2012-04-13 LAB — BASIC METABOLIC PANEL
Chloride: 105 mEq/L (ref 96–112)
Creatinine, Ser: 0.8 mg/dL (ref 0.50–1.10)
GFR calc Af Amer: >90 mL/min (ref >90)
Potassium: 3.5 mEq/L (ref 3.5–5.1)

## 2012-04-13 LAB — URINE MICROSCOPIC-ADD ON

## 2012-04-13 LAB — CBC WITH DIFFERENTIAL/PLATELET
Basophils Absolute: 0 10*3/uL (ref 0.0–0.1)
Basophils Relative: 0 % (ref 0–1)
MCHC: 34.1 g/dL (ref 30.0–36.0)
Neutro Abs: 5.8 10*3/uL (ref 1.7–7.7)
Neutrophils Relative %: 69 % (ref 43–77)
Platelets: 243 10*3/uL (ref 150–400)
RDW: 12.2 % (ref 11.5–15.5)

## 2012-04-13 LAB — URINALYSIS, ROUTINE W REFLEX MICROSCOPIC
Glucose, UA: NEGATIVE mg/dL
Specific Gravity, Urine: 1.016 (ref 1.005–1.030)

## 2012-04-13 LAB — PREGNANCY, URINE: Preg Test, Ur: NEGATIVE

## 2012-04-13 MED ORDER — HYDROMORPHONE HCL PF 1 MG/ML IJ SOLN
1.0000 mg | Freq: Once | INTRAMUSCULAR | Status: AC
  Administered 2012-04-13: 1 mg via INTRAVENOUS
  Filled 2012-04-13: qty 1

## 2012-04-13 MED ORDER — PROMETHAZINE HCL 25 MG/ML IJ SOLN
25.0000 mg | Freq: Once | INTRAMUSCULAR | Status: AC
  Administered 2012-04-13: 25 mg via INTRAMUSCULAR
  Filled 2012-04-13: qty 1

## 2012-04-13 MED ORDER — SODIUM CHLORIDE 0.9 % IV BOLUS (SEPSIS): 1000.0000 mL | Freq: Once | INTRAVENOUS | Status: DC

## 2012-04-13 MED ORDER — SODIUM CHLORIDE 0.9 % IV SOLN
Freq: Once | INTRAVENOUS | Status: AC
  Administered 2012-04-13: 14:00:00 via INTRAVENOUS

## 2012-04-13 NOTE — ED Notes (Signed)
Pt presents to ED today with continued c/o N/V.  Pt has been seen here several times for same and states "I want dilaudid and phenergan, its the only thing that helps"  Pt able to ambulate to BR with staff assist with no difficulties

## 2012-04-13 NOTE — ED Provider Notes (Signed)
History   This chart was scribed for Sherry Fuel, MD by Shona Needles. The patient was seen in room MH01/MH01. Patient's care was started at 1320.  CSN: [id number]  Arrival date & time 04/13/12  64   First MD Initiated Contact with Patient 04/13/12 1504      Chief Complaint  Patient presents with  . Emesis   HPI  Sherry Key is a 26 y.o. female with a h/o CBS, tubal ligation, cholecystectomy, and tonsilectomy who presents to the ED complaining of 2 day sudden onset severe constant nausea and emesis worsening yesterday with associated lower abdominal pain, diaphoresis, mild fever, and mild chills. Abdominal pain is constant, ranked (10/10), described as sharp, and worsened with certain positions. PTA pt has not treated symptoms. Typically, Pt treats symptoms with Finnegan and Reglan, though denotes medications are less effective after onset. Denies hematemesis, blood in stool, constipation, diarrhea, and rectal bleeding.   Pt lists PCP [facility]  Past Medical History  Diagnosis Date  . Hypertension   . Cyclical vomiting     Past Surgical History  Procedure Date  . Tonsillectomy   . Tubal ligation   . Cesarean section   . Cholecystectomy   . Cesarean section   . Tubal ligation     History reviewed. No pertinent family history.  History  Substance Use Topics  . Smoking status: Never Smoker   . Smokeless tobacco: Never Used  . Alcohol Use: No    Review of Systems  Constitutional: Positive for diaphoresis.  Gastrointestinal: Positive for nausea, vomiting and abdominal pain.  Musculoskeletal: Positive for back pain.  All other systems reviewed and are negative.    Allergies  Other; Peanut-containing drug products; Percocet; Tomato; and Zofran  Home Medications   Current Outpatient Rx  Name Route Sig Dispense Refill  . FLINTSTONES COMPLETE 60 MG PO CHEW Oral Chew 1 tablet by mouth daily.      Marland Kitchen LABETALOL HCL 100 MG PO TABS Oral Take 100 mg by mouth 2  (two) times daily.      Marland Kitchen LORAZEPAM 2 MG PO TABS Oral Take 2 mg by mouth at bedtime as needed. For sleep    . PROCHLORPERAZINE 25 MG RE SUPP Rectal Place 1 suppository (25 mg total) rectally every 12 (twelve) hours as needed for nausea. 12 suppository 0  . PROMETHAZINE HCL 25 MG RE SUPP Rectal Place 1 suppository (25 mg total) rectally every 6 (six) hours as needed for nausea. 12 each 0  . PROMETHAZINE HCL 25 MG RE SUPP Rectal Place 1 suppository (25 mg total) rectally every 6 (six) hours as needed for nausea. 12 each 0  . PROMETHAZINE HCL 25 MG RE SUPP Rectal Place 1 suppository (25 mg total) rectally every 6 (six) hours as needed for nausea. 20 each 0  . PROMETHAZINE HCL 25 MG RE SUPP Rectal Place 25 mg rectally every 6 (six) hours as needed. For nausea    . PROMETHAZINE HCL 25 MG RE SUPP Rectal Place 1 suppository (25 mg total) rectally every 6 (six) hours as needed for nausea. 12 each 2  . PROMETHAZINE HCL 25 MG PO TABS Oral Take 1 tablet (25 mg total) by mouth every 6 (six) hours as needed for nausea. 30 tablet 0  . PROMETHAZINE HCL 25 MG PO TABS Oral Take 1 tablet (25 mg total) by mouth every 6 (six) hours as needed for nausea. 15 tablet 0  . PROMETHAZINE HCL 25 MG PO TABS Oral Take 1 tablet (25  mg total) by mouth every 6 (six) hours as needed for nausea. 20 tablet 0  . PROMETHAZINE HCL 25 MG PO TABS  Take 1 tablet every 6 hours as needed for nausea and vomiting. 30 tablet 0    BP 155/116  Pulse 103  Resp 16  Ht 5' 3"  (1.6 m)  Wt 145 lb (65.772 kg)  BMI 25.69 kg/m2  SpO2 100%  LMP 04/13/2012  Physical Exam  Constitutional: She is oriented to person, place, and time. She appears well-developed and well-nourished. No distress.       Appears uncomfortable.  HENT:  Head: Normocephalic and atraumatic.  Mouth/Throat: No oropharyngeal exudate.  Neck: Normal range of motion. Neck supple. No tracheal deviation present.  Cardiovascular: Normal rate, regular rhythm and normal heart sounds.     No murmur heard. Pulmonary/Chest: Effort normal and breath sounds normal.  Abdominal: There is no rebound and no guarding.       Mild tenderness across the lower abdomen. Dowel sounds decreased.  Musculoskeletal: Normal range of motion. She exhibits no edema and no tenderness.  Lymphadenopathy:    She has no cervical adenopathy.  Neurological: She is alert and oriented to person, place, and time. Coordination normal.  Skin: Skin is warm and dry. No rash noted.  Psychiatric: She has a normal mood and affect. Her behavior is normal. Judgment and thought content normal.    ED Course  Procedures  DIAGNOSTIC STUDIES: Oxygen Saturation is 100% on room air, normal by my interpretation.    COORDINATION OF CARE: 15:07- Evaluated Pt. Pt is awake, alert, and oriented. 15:11- Ordered CBC with Differential STAT and Basic metabolic panel STAT. 65:68- Rechecked Pt and discussed findings of blood work. Pt reports she is not feeling any better. Will continue to monitor. 16:30- Ordered HYDROmorphone (DILAUDID) injection 1 mg Once and promethazine (PHENERGAN) injection 25 mg Once.  Results for orders placed during the hospital encounter of 04/13/12  URINALYSIS, ROUTINE W REFLEX MICROSCOPIC      Component Value Range   Color, Urine YELLOW  YELLOW   APPearance CLEAR  CLEAR   Specific Gravity, Urine 1.016  1.005 - 1.030   pH 6.5  5.0 - 8.0   Glucose, UA NEGATIVE  NEGATIVE mg/dL   Hgb urine dipstick LARGE (*) NEGATIVE   Bilirubin Urine NEGATIVE  NEGATIVE   Ketones, ur 15 (*) NEGATIVE mg/dL   Protein, ur NEGATIVE  NEGATIVE mg/dL   Urobilinogen, UA 0.2  0.0 - 1.0 mg/dL   Nitrite NEGATIVE  NEGATIVE   Leukocytes, UA NEGATIVE  NEGATIVE  PREGNANCY, URINE      Component Value Range   Preg Test, Ur NEGATIVE  NEGATIVE  URINE MICROSCOPIC-ADD ON      Component Value Range   Squamous Epithelial / LPF RARE  RARE   WBC, UA 0-2  <3 WBC/hpf   RBC / HPF 7-10  <3 RBC/hpf   Bacteria, UA FEW (*) RARE  CBC WITH  DIFFERENTIAL      Component Value Range   WBC 8.5  4.0 - 10.5 K/uL   RBC 4.33  3.87 - 5.11 MIL/uL   Hemoglobin 13.3  12.0 - 15.0 g/dL   HCT 39.0  36.0 - 46.0 %   MCV 90.1  78.0 - 100.0 fL   MCH 30.7  26.0 - 34.0 pg   MCHC 34.1  30.0 - 36.0 g/dL   RDW 12.2  11.5 - 15.5 %   Platelets 243  150 - 400 K/uL   Neutrophils Relative  69  43 - 77 %   Neutro Abs 5.8  1.7 - 7.7 K/uL   Lymphocytes Relative 22  12 - 46 %   Lymphs Abs 1.9  0.7 - 4.0 K/uL   Monocytes Relative 8  3 - 12 %   Monocytes Absolute 0.6  0.1 - 1.0 K/uL   Eosinophils Relative 1  0 - 5 %   Eosinophils Absolute 0.1  0.0 - 0.7 K/uL   Basophils Relative 0  0 - 1 %   Basophils Absolute 0.0  0.0 - 0.1 K/uL  BASIC METABOLIC PANEL      Component Value Range   Sodium 138  135 - 145 mEq/L   Potassium 3.5  3.5 - 5.1 mEq/L   Chloride 105  96 - 112 mEq/L   CO2 21  19 - 32 mEq/L   Glucose, Bld 97  70 - 99 mg/dL   BUN 8  6 - 23 mg/dL   Creatinine, Ser 0.80  0.50 - 1.10 mg/dL   Calcium 9.6  8.4 - 10.5 mg/dL   GFR calc non Af Amer >90  >90 mL/min   GFR calc Af Amer >90  >90 mL/min      1. Cyclical vomiting syndrome       MDM  Abdominal pain and vomiting which appear to be in exacerbation of cyclic vomiting syndrome. Old records are reviewed and she has multiple ED visits which are usually treated with IV fluids and IV hydromorphone and promethazine. She'll be given IV fluids and IV hydromorphone and given intramuscular promethazine.  After a single dose of hydromorphone and promethazine, she had no relief. She will be given a second dose.  After second dose of hydromorphone and promethazine, she felt much better and she will be discharged.    I personally performed the services described in this documentation, which was scribed in my presence. The recorded information has been reviewed and considered.        Sherry Fuel, MD 50/53/97 [id number]

## 2012-04-13 NOTE — ED Notes (Signed)
N/V/D since Thursday night

## 2012-04-13 NOTE — ED Notes (Signed)
MD at bedside. 

## 2012-04-15 ENCOUNTER — Emergency Department (HOSPITAL_BASED_OUTPATIENT_CLINIC_OR_DEPARTMENT_OTHER)
Admission: EM | Admit: 2012-04-15 | Discharge: 2012-04-15 | Disposition: A | Discharge status: Home / Self Care | Payer: Self-pay | Attending: Emergency Medicine | Admitting: Emergency Medicine

## 2012-04-15 ENCOUNTER — Encounter (HOSPITAL_BASED_OUTPATIENT_CLINIC_OR_DEPARTMENT_OTHER): Payer: Self-pay | Admitting: *Deleted

## 2012-04-15 DIAGNOSIS — R1115 Cyclical vomiting syndrome unrelated to migraine: Secondary | ICD-10-CM | POA: Insufficient documentation

## 2012-04-15 DIAGNOSIS — R1084 Generalized abdominal pain: Secondary | ICD-10-CM | POA: Insufficient documentation

## 2012-04-15 DIAGNOSIS — E876 Hypokalemia: Secondary | ICD-10-CM | POA: Insufficient documentation

## 2012-04-15 DIAGNOSIS — I10 Essential (primary) hypertension: Secondary | ICD-10-CM | POA: Insufficient documentation

## 2012-04-15 DIAGNOSIS — E860 Dehydration: Secondary | ICD-10-CM | POA: Insufficient documentation

## 2012-04-15 LAB — CBC WITH DIFFERENTIAL/PLATELET
Basophils Absolute: 0 10*3/uL (ref 0.0–0.1)
HCT: 38.3 % (ref 36.0–46.0)
Hemoglobin: 13.2 g/dL (ref 12.0–15.0)
Lymphocytes Relative: 7 % — ABNORMAL LOW (ref 12–46)
Monocytes Absolute: 0.6 10*3/uL (ref 0.1–1.0)
Neutro Abs: 12.4 10*3/uL — ABNORMAL HIGH (ref 1.7–7.7)
RDW: 12 % (ref 11.5–15.5)
WBC: 14 10*3/uL — ABNORMAL HIGH (ref 4.0–10.5)

## 2012-04-15 LAB — COMPREHENSIVE METABOLIC PANEL
ALT: 8 U/L (ref 0–35)
AST: 16 U/L (ref 0–37)
Alkaline Phosphatase: 73 U/L (ref 39–117)
CO2: 22 mEq/L (ref 19–32)
Chloride: 103 mEq/L (ref 96–112)
Creatinine, Ser: 0.8 mg/dL (ref 0.50–1.10)
GFR calc non Af Amer: >90 mL/min (ref >90)
Sodium: 139 mEq/L (ref 135–145)
Total Bilirubin: 0.4 mg/dL (ref 0.3–1.2)

## 2012-04-15 MED ORDER — HYDROMORPHONE HCL PF 1 MG/ML IJ SOLN
1.0000 mg | Freq: Once | INTRAMUSCULAR | Status: AC
  Administered 2012-04-15: 1 mg via INTRAVENOUS
  Filled 2012-04-15: qty 1

## 2012-04-15 MED ORDER — DEXTROSE-NACL 5-0.9 % IV SOLN: INTRAVENOUS | Status: DC

## 2012-04-15 MED ORDER — KCL IN DEXTROSE-NACL 40-5-0.9 MEQ/L-%-% IV SOLN
INTRAVENOUS | Status: DC
  Filled 2012-04-15: qty 1000

## 2012-04-15 MED ORDER — KCL IN DEXTROSE-NACL 20-5-0.45 MEQ/L-%-% IV SOLN
INTRAVENOUS | Status: AC
  Administered 2012-04-15: 1000 mL
  Filled 2012-04-15: qty 1000

## 2012-04-15 MED ORDER — PROMETHAZINE HCL 25 MG/ML IJ SOLN
25.0000 mg | Freq: Once | INTRAMUSCULAR | Status: AC
  Administered 2012-04-15: 25 mg via INTRAVENOUS
  Filled 2012-04-15: qty 1

## 2012-04-15 MED ORDER — METOCLOPRAMIDE HCL 5 MG/ML IJ SOLN
10.0000 mg | Freq: Once | INTRAMUSCULAR | Status: AC
  Administered 2012-04-15: 10 mg via INTRAVENOUS
  Filled 2012-04-15: qty 2

## 2012-04-15 MED ORDER — SODIUM CHLORIDE 0.9 % IV BOLUS (SEPSIS)
1000.0000 mL | Freq: Once | INTRAVENOUS | Status: AC
  Administered 2012-04-15: 1000 mL via INTRAVENOUS

## 2012-04-15 MED ORDER — PROMETHAZINE HCL 25 MG/ML IJ SOLN
12.5000 mg | Freq: Once | INTRAMUSCULAR | Status: AC
  Administered 2012-04-15: 12.5 mg via INTRAVENOUS
  Filled 2012-04-15: qty 1

## 2012-04-15 NOTE — ED Provider Notes (Addendum)
History     CSN: [id number]  Arrival date & time 04/15/12  0277   First MD Initiated Contact with Patient 04/15/12 [phone]      Chief Complaint  Patient presents with  . Emesis    (Consider location/radiation/quality/duration/timing/severity/associated sxs/prior treatment) Patient is a 26 y.o. female presenting with vomiting. The history is provided by the patient.  Emesis  This is a chronic problem. The current episode started 2 days ago. The problem occurs continuously. The problem has not changed since onset.The emesis has an appearance of stomach contents. There has been no fever. Associated symptoms include abdominal pain. Pertinent negatives include no chills, no cough, no diarrhea, no fever, no headaches and no URI. Risk factors: always seems to start around her menses which she is on now.    Past Medical History  Diagnosis Date  . Hypertension   . Cyclical vomiting     Past Surgical History  Procedure Date  . Tonsillectomy   . Tubal ligation   . Cesarean section   . Cholecystectomy   . Cesarean section   . Tubal ligation     No family history on file.  History  Substance Use Topics  . Smoking status: Never Smoker   . Smokeless tobacco: Never Used  . Alcohol Use: No    OB History    Grav Para Term Preterm Abortions TAB SAB Ect Mult Living                  Review of Systems  Constitutional: Negative for fever and chills.  Respiratory: Negative for cough, shortness of breath and wheezing.   Cardiovascular: Negative for chest pain and leg swelling.  Gastrointestinal: Positive for nausea, vomiting and abdominal pain. Negative for diarrhea.  Genitourinary: Negative for dysuria.       Normal menses  Neurological: Negative for weakness and headaches.  All other systems reviewed and are negative.    Allergies  Other; Peanut-containing drug products; Percocet; Tomato; and Zofran  Home Medications   Current Outpatient Rx  Name Route Sig Dispense Refill    . FLINTSTONES COMPLETE 60 MG PO CHEW Oral Chew 1 tablet by mouth daily.      Marland Kitchen LABETALOL HCL 100 MG PO TABS Oral Take 100 mg by mouth 2 (two) times daily.      Marland Kitchen LORAZEPAM 2 MG PO TABS Oral Take 2 mg by mouth at bedtime as needed. For sleep    . PROCHLORPERAZINE 25 MG RE SUPP Rectal Place 1 suppository (25 mg total) rectally every 12 (twelve) hours as needed for nausea. 12 suppository 0  . PROMETHAZINE HCL 25 MG RE SUPP Rectal Place 1 suppository (25 mg total) rectally every 6 (six) hours as needed for nausea. 12 each 0  . PROMETHAZINE HCL 25 MG RE SUPP Rectal Place 1 suppository (25 mg total) rectally every 6 (six) hours as needed for nausea. 12 each 0  . PROMETHAZINE HCL 25 MG RE SUPP Rectal Place 1 suppository (25 mg total) rectally every 6 (six) hours as needed for nausea. 20 each 0  . PROMETHAZINE HCL 25 MG RE SUPP Rectal Place 25 mg rectally every 6 (six) hours as needed. For nausea    . PROMETHAZINE HCL 25 MG RE SUPP Rectal Place 1 suppository (25 mg total) rectally every 6 (six) hours as needed for nausea. 12 each 2  . PROMETHAZINE HCL 25 MG PO TABS Oral Take 1 tablet (25 mg total) by mouth every 6 (six) hours as needed for nausea.  30 tablet 0  . PROMETHAZINE HCL 25 MG PO TABS Oral Take 1 tablet (25 mg total) by mouth every 6 (six) hours as needed for nausea. 15 tablet 0  . PROMETHAZINE HCL 25 MG PO TABS Oral Take 1 tablet (25 mg total) by mouth every 6 (six) hours as needed for nausea. 20 tablet 0  . PROMETHAZINE HCL 25 MG PO TABS  Take 1 tablet every 6 hours as needed for nausea and vomiting. 30 tablet 0    BP 184/100  Temp 98.7 F (37.1 C) (Oral)  Resp 20  Ht 5' 3"  (1.6 m)  Wt 140 lb (63.504 kg)  BMI 24.80 kg/m2  SpO2 100%  LMP 04/13/2012  Physical Exam  Nursing note and vitals reviewed. Constitutional: She is oriented to person, place, and time. She appears well-developed and well-nourished. No distress.  HENT:  Head: Normocephalic and atraumatic.  Mouth/Throat: Oropharynx  is clear and moist. Mucous membranes are dry.  Eyes: Conjunctivae normal and EOM are normal. Pupils are equal, round, and reactive to light.  Neck: Normal range of motion. Neck supple.  Cardiovascular: Normal rate, regular rhythm and intact distal pulses.   No murmur heard. Pulmonary/Chest: Effort normal and breath sounds normal. No respiratory distress. She has no wheezes. She has no rales.  Abdominal: Soft. Normal appearance. She exhibits no distension. There is generalized tenderness. There is no rebound, no guarding and no CVA tenderness.  Musculoskeletal: Normal range of motion. She exhibits no edema and no tenderness.  Neurological: She is alert and oriented to person, place, and time.  Skin: Skin is warm and dry. No rash noted. No erythema.  Psychiatric: She has a normal mood and affect. Her behavior is normal.    ED Course  Procedures (including critical care time)  Labs Reviewed  CBC WITH DIFFERENTIAL - Abnormal; Notable for the following:    WBC 14.0 (*)     Neutrophils Relative 88 (*)     Neutro Abs 12.4 (*)     Lymphocytes Relative 7 (*)     All other components within normal limits  COMPREHENSIVE METABOLIC PANEL - Abnormal; Notable for the following:    Potassium 2.9 (*)  DELTA CHECK NOTED   Glucose, Bld 141 (*)     All other components within normal limits  LACTIC ACID, PLASMA - Abnormal; Notable for the following:    Lactic Acid, Venous 2.3 (*)     All other components within normal limits  LIPASE, BLOOD   No results found.   1. Cyclical vomiting syndrome   2. Hypokalemia   3. Dehydration       MDM   Patient with a history of cyclical vomiting syndrome who presents today with persistent vomiting since Saturday when she was seen. She states that she still has Phenergan and Dilaudid at home however she continues to vomit them up. She does have a history of hypertension has been unable to hold down her labetalol. She states this is exactly like her normal  episodes of cyclical vomiting without any new changes. She states during these episodes she always has general lower abdominal pain and back pain. She denies fever, diarrhea, hematemesis.  Patient given IV meds to attempt to stop the vomiting. The patient is more comfortable we'll recheck blood pressure if still elevated given a by mouth dose of her labetalol. CBC, CMP, lipase, lactic acid pending.   8:21 AM Pt's labs indicate mild elevation in lactic acid as well as hypokalemia today of 2.9.  Pt given D5 with K and fluid bolus prior.  Will continue to treat nausea.   8:48 AM Pt still feeling nauseated and having pain.  Will give a second dose of meds.    9:49 AM BP improved but still high.  156/112.  We do not have labetolol here but pt will take when she gets home.  Blanchie Dessert, MD 04/15/12 [id number]  Blanchie Dessert, MD 04/15/12 1125

## 2012-04-15 NOTE — ED Notes (Signed)
Dr Randal Buba aware of pt's b/p and that she is c/o vomiting and pain

## 2012-04-15 NOTE — ED Notes (Signed)
Pt. States hx of cyclic vomiting syndrome that started again Sat and again this morning. Pt. Vomiting approx 176m of emesis on arrival to ED. C/o  General back/and lower abd pain. Describes as constant and sharp. Denies fevers.

## 2012-06-03 ENCOUNTER — Emergency Department (HOSPITAL_BASED_OUTPATIENT_CLINIC_OR_DEPARTMENT_OTHER)
Admission: EM | Admit: 2012-06-03 | Discharge: 2012-06-03 | Disposition: A | Discharge status: Home / Self Care | Payer: Self-pay | Attending: Emergency Medicine | Admitting: Emergency Medicine

## 2012-06-03 ENCOUNTER — Encounter (HOSPITAL_BASED_OUTPATIENT_CLINIC_OR_DEPARTMENT_OTHER): Payer: Self-pay | Admitting: Emergency Medicine

## 2012-06-03 ENCOUNTER — Emergency Department (HOSPITAL_BASED_OUTPATIENT_CLINIC_OR_DEPARTMENT_OTHER): Payer: Self-pay

## 2012-06-03 DIAGNOSIS — R52 Pain, unspecified: Secondary | ICD-10-CM | POA: Insufficient documentation

## 2012-06-03 DIAGNOSIS — R0602 Shortness of breath: Secondary | ICD-10-CM | POA: Insufficient documentation

## 2012-06-03 DIAGNOSIS — Z79899 Other long term (current) drug therapy: Secondary | ICD-10-CM | POA: Insufficient documentation

## 2012-06-03 DIAGNOSIS — I10 Essential (primary) hypertension: Secondary | ICD-10-CM | POA: Insufficient documentation

## 2012-06-03 DIAGNOSIS — R1115 Cyclical vomiting syndrome unrelated to migraine: Secondary | ICD-10-CM | POA: Insufficient documentation

## 2012-06-03 LAB — COMPREHENSIVE METABOLIC PANEL
Alkaline Phosphatase: 74 U/L (ref 39–117)
BUN: 8 mg/dL (ref 6–23)
CO2: 18 mEq/L — ABNORMAL LOW (ref 19–32)
Calcium: 9.3 mg/dL (ref 8.4–10.5)
GFR calc Af Amer: >90 mL/min (ref >90)
GFR calc non Af Amer: >90 mL/min (ref >90)
Glucose, Bld: 103 mg/dL — ABNORMAL HIGH (ref 70–99)
Potassium: 3.7 mEq/L (ref 3.5–5.1)
Total Protein: 7.9 g/dL (ref 6.0–8.3)

## 2012-06-03 LAB — CBC WITH DIFFERENTIAL/PLATELET
Eosinophils Absolute: 0 10*3/uL (ref 0.0–0.7)
Eosinophils Relative: 0 % (ref 0–5)
Hemoglobin: 12.7 g/dL (ref 12.0–15.0)
Lymphocytes Relative: 1 % — ABNORMAL LOW (ref 12–46)
Lymphs Abs: 0.3 10*3/uL — ABNORMAL LOW (ref 0.7–4.0)
MCH: 30.3 pg (ref 26.0–34.0)
MCV: 87.1 fL (ref 78.0–100.0)
Monocytes Relative: 2 % — ABNORMAL LOW (ref 3–12)
RBC: 4.19 MIL/uL (ref 3.87–5.11)

## 2012-06-03 LAB — LIPASE, BLOOD: Lipase: 13 U/L (ref 11–59)

## 2012-06-03 LAB — URINALYSIS, ROUTINE W REFLEX MICROSCOPIC
Bilirubin Urine: NEGATIVE
Glucose, UA: NEGATIVE mg/dL
Ketones, ur: 15 mg/dL — AB
pH: 7 (ref 5.0–8.0)

## 2012-06-03 LAB — URINE MICROSCOPIC-ADD ON

## 2012-06-03 MED ORDER — PROMETHAZINE HCL 25 MG/ML IJ SOLN
25.0000 mg | Freq: Once | INTRAMUSCULAR | Status: AC
  Administered 2012-06-03: 25 mg via INTRAVENOUS
  Filled 2012-06-03: qty 1

## 2012-06-03 MED ORDER — HYDROMORPHONE HCL PF 1 MG/ML IJ SOLN
1.0000 mg | Freq: Once | INTRAMUSCULAR | Status: AC
  Administered 2012-06-03: 1 mg via INTRAVENOUS
  Filled 2012-06-03: qty 1

## 2012-06-03 MED ORDER — SODIUM CHLORIDE 0.9 % IV BOLUS (SEPSIS)
1000.0000 mL | Freq: Once | INTRAVENOUS | Status: AC
  Administered 2012-06-03: 1000 mL via INTRAVENOUS

## 2012-06-03 NOTE — ED Notes (Signed)
Arterial stick used to get labs per verbal order from Dr. Tamera Punt. RRadial x1 attempt. Pressure held till bleeding stopped.

## 2012-06-03 NOTE — ED Notes (Signed)
Patient transported to X-ray 

## 2012-06-03 NOTE — ED Notes (Signed)
Pt reports chills, N/V/D, abd pain, shortness of breath and generalized body aches x 2 days. Pt reports taking phenergan w/ no relief.

## 2012-06-03 NOTE — ED Provider Notes (Addendum)
History     CSN: [id number]  Arrival date & time 06/03/12  5462   First MD Initiated Contact with Patient 06/03/12 1016      Chief Complaint  Patient presents with  . Emesis  . Generalized Body Aches  . Shortness of Breath    (Consider location/radiation/quality/duration/timing/severity/associated sxs/prior treatment) HPI Comments: Patient with a history of cyclic vomiting syndrome presents with nausea vomiting and abdominal pain similar to her past flareups. She states it started 2 days ago and has been worsening since then. She's taking her usual home medications of Dilaudid and Phenergan with no improvement. She denies any fevers or chills. She denies any urinary symptoms. She does take blood pressure medicine at home which she's been unable to keep down. She is status post cholecystectomy. Her primary care physician is with [facility] in [facility].  Patient is a 26 y.o. female presenting with vomiting and shortness of breath.  Emesis   Shortness of Breath  Associated symptoms include shortness of breath.    Past Medical History  Diagnosis Date  . Hypertension   . Cyclical vomiting     Past Surgical History  Procedure Date  . Tonsillectomy   . Tubal ligation   . Cesarean section   . Cholecystectomy   . Cesarean section   . Tubal ligation     No family history on file.  History  Substance Use Topics  . Smoking status: Never Smoker   . Smokeless tobacco: Never Used  . Alcohol Use: No    OB History    Grav Para Term Preterm Abortions TAB SAB Ect Mult Living                  Review of Systems  Respiratory: Positive for shortness of breath.   Gastrointestinal: Positive for vomiting.    Allergies  Other; Peanut-containing drug products; Percocet; Tomato; and Zofran  Home Medications   Current Outpatient Rx  Name  Route  Sig  Dispense  Refill  . DILAUDID PO   Oral   Take 100 mg by mouth 2 (two) times daily.         Marland Kitchen FLINTSTONES  COMPLETE 60 MG PO CHEW   Oral   Chew 1 tablet by mouth daily.           Marland Kitchen LABETALOL HCL 100 MG PO TABS   Oral   Take 100 mg by mouth 2 (two) times daily.           Marland Kitchen LORAZEPAM 2 MG PO TABS   Oral   Take 2 mg by mouth at bedtime as needed. For sleep         . PROCHLORPERAZINE 25 MG RE SUPP   Rectal   Place 1 suppository (25 mg total) rectally every 12 (twelve) hours as needed for nausea.   12 suppository   0   . PROMETHAZINE HCL 25 MG RE SUPP   Rectal   Place 1 suppository (25 mg total) rectally every 6 (six) hours as needed for nausea.   12 each   0   . PROMETHAZINE HCL 25 MG RE SUPP   Rectal   Place 1 suppository (25 mg total) rectally every 6 (six) hours as needed for nausea.   12 each   0   . PROMETHAZINE HCL 25 MG RE SUPP   Rectal   Place 1 suppository (25 mg total) rectally every 6 (six) hours as needed for nausea.   20 each   0   .  PROMETHAZINE HCL 25 MG RE SUPP   Rectal   Place 25 mg rectally every 6 (six) hours as needed. For nausea         . PROMETHAZINE HCL 25 MG RE SUPP   Rectal   Place 1 suppository (25 mg total) rectally every 6 (six) hours as needed for nausea.   12 each   2   . PROMETHAZINE HCL 25 MG PO TABS   Oral   Take 1 tablet (25 mg total) by mouth every 6 (six) hours as needed for nausea.   30 tablet   0   . PROMETHAZINE HCL 25 MG PO TABS   Oral   Take 1 tablet (25 mg total) by mouth every 6 (six) hours as needed for nausea.   15 tablet   0   . PROMETHAZINE HCL 25 MG PO TABS   Oral   Take 1 tablet (25 mg total) by mouth every 6 (six) hours as needed for nausea.   20 tablet   0   . PROMETHAZINE HCL 25 MG PO TABS      Take 1 tablet every 6 hours as needed for nausea and vomiting.   30 tablet   0     BP 179/120  Pulse 100  Temp 97.6 F (36.4 C) (Oral)  Resp 22  Ht 5' 3"  (1.6 m)  Wt 145 lb (65.772 kg)  BMI 25.69 kg/m2  SpO2 100%  LMP 05/10/2012  Physical Exam  Constitutional: She is oriented to person, place,  and time. She appears well-developed and well-nourished.  HENT:  Head: Normocephalic and atraumatic.       Slightly dry mucus membranes  Eyes: Pupils are equal, round, and reactive to light.  Neck: Normal range of motion. Neck supple.  Cardiovascular: Normal rate, regular rhythm and normal heart sounds.   Pulmonary/Chest: Effort normal and breath sounds normal. No respiratory distress. She has no wheezes. She has no rales. She exhibits no tenderness.  Abdominal: Soft. Bowel sounds are normal. There is tenderness (moderate TTP across upper abdomen). There is no rebound and no guarding.  Musculoskeletal: Normal range of motion. She exhibits no edema.  Lymphadenopathy:    She has no cervical adenopathy.  Neurological: She is alert and oriented to person, place, and time.  Skin: Skin is warm and dry. No rash noted.  Psychiatric: She has a normal mood and affect.    ED Course  Procedures (including critical care time)  Results for orders placed during the hospital encounter of 06/03/12  URINALYSIS, ROUTINE W REFLEX MICROSCOPIC      Component Value Range   Color, Urine YELLOW  YELLOW   APPearance CLOUDY (*) CLEAR   Specific Gravity, Urine 1.022  1.005 - 1.030   pH 7.0  5.0 - 8.0   Glucose, UA NEGATIVE  NEGATIVE mg/dL   Hgb urine dipstick SMALL (*) NEGATIVE   Bilirubin Urine NEGATIVE  NEGATIVE   Ketones, ur 15 (*) NEGATIVE mg/dL   Protein, ur 30 (*) NEGATIVE mg/dL   Urobilinogen, UA 0.2  0.0 - 1.0 mg/dL   Nitrite NEGATIVE  NEGATIVE   Leukocytes, UA NEGATIVE  NEGATIVE  PREGNANCY, URINE      Component Value Range   Preg Test, Ur NEGATIVE  NEGATIVE  CBC WITH DIFFERENTIAL      Component Value Range   WBC 21.3 (*) 4.0 - 10.5 K/uL   RBC 4.19  3.87 - 5.11 MIL/uL   Hemoglobin 12.7  12.0 - 15.0 g/dL  HCT 36.5  36.0 - 46.0 %   MCV 87.1  78.0 - 100.0 fL   MCH 30.3  26.0 - 34.0 pg   MCHC 34.8  30.0 - 36.0 g/dL   RDW 12.2  11.5 - 15.5 %   Platelets 219  150 - 400 K/uL   Neutrophils  Relative 97 (*) 43 - 77 %   Neutro Abs 20.6 (*) 1.7 - 7.7 K/uL   Lymphocytes Relative 1 (*) 12 - 46 %   Lymphs Abs 0.3 (*) 0.7 - 4.0 K/uL   Monocytes Relative 2 (*) 3 - 12 %   Monocytes Absolute 0.4  0.1 - 1.0 K/uL   Eosinophils Relative 0  0 - 5 %   Eosinophils Absolute 0.0  0.0 - 0.7 K/uL   Basophils Relative 0  0 - 1 %   Basophils Absolute 0.0  0.0 - 0.1 K/uL  COMPREHENSIVE METABOLIC PANEL      Component Value Range   Sodium 136  135 - 145 mEq/L   Potassium 3.7  3.5 - 5.1 mEq/L   Chloride 104  96 - 112 mEq/L   CO2 18 (*) 19 - 32 mEq/L   Glucose, Bld 103 (*) 70 - 99 mg/dL   BUN 8  6 - 23 mg/dL   Creatinine, Ser 0.60  0.50 - 1.10 mg/dL   Calcium 9.3  8.4 - 10.5 mg/dL   Total Protein 7.9  6.0 - 8.3 g/dL   Albumin 4.1  3.5 - 5.2 g/dL   AST 16  0 - 37 U/L   ALT 8  0 - 35 U/L   Alkaline Phosphatase 74  39 - 117 U/L   Total Bilirubin 0.7  0.3 - 1.2 mg/dL   GFR calc non Af Amer >90  >90 mL/min   GFR calc Af Amer >90  >90 mL/min  LIPASE, BLOOD      Component Value Range   Lipase 13  11 - 59 U/L  URINE MICROSCOPIC-ADD ON      Component Value Range   Squamous Epithelial / LPF FEW (*) RARE   WBC, UA 0-2  <3 WBC/hpf   RBC / HPF 3-6  <3 RBC/hpf   Bacteria, UA FEW (*) RARE   Urine-Other MUCOUS PRESENT     No results found.   1. Cyclical vomiting syndrome       MDM  Patient is feeling much better after IV fluids, Tylenol or and Phenergan. I was concerned about her elevated white count and bicarbonate 18. However she is sitting up in bed smiling. She's tolerating by mouth fluids and crackers. I was trying to give her a second liter of IV fluids however she states that she has to go to pick up her daughter from the bus stop so she has not missed any longer. Her repeat abdominal exam was unremarkable. I advised her if her abdominal pain worsens or she has symptoms other than her typical flareups then she's return for reevaluation. I advised her to follow with her primary care  physician within 2 days if her symptoms are improved        Malvin Johns, MD 06/03/12 [place], MD 06/15/12 2312

## 2012-06-04 ENCOUNTER — Emergency Department (HOSPITAL_BASED_OUTPATIENT_CLINIC_OR_DEPARTMENT_OTHER)
Admission: EM | Admit: 2012-06-04 | Discharge: 2012-06-04 | Disposition: A | Discharge status: Home / Self Care | Payer: Self-pay | Attending: Emergency Medicine | Admitting: Emergency Medicine

## 2012-06-04 ENCOUNTER — Encounter (HOSPITAL_BASED_OUTPATIENT_CLINIC_OR_DEPARTMENT_OTHER): Payer: Self-pay | Admitting: Emergency Medicine

## 2012-06-04 DIAGNOSIS — R1115 Cyclical vomiting syndrome unrelated to migraine: Secondary | ICD-10-CM | POA: Insufficient documentation

## 2012-06-04 DIAGNOSIS — E876 Hypokalemia: Secondary | ICD-10-CM | POA: Insufficient documentation

## 2012-06-04 DIAGNOSIS — I10 Essential (primary) hypertension: Secondary | ICD-10-CM | POA: Insufficient documentation

## 2012-06-04 DIAGNOSIS — Z79899 Other long term (current) drug therapy: Secondary | ICD-10-CM | POA: Insufficient documentation

## 2012-06-04 DIAGNOSIS — R197 Diarrhea, unspecified: Secondary | ICD-10-CM | POA: Insufficient documentation

## 2012-06-04 LAB — URINALYSIS, ROUTINE W REFLEX MICROSCOPIC
Bilirubin Urine: NEGATIVE
Glucose, UA: NEGATIVE mg/dL
Ketones, ur: 40 mg/dL — AB
Protein, ur: NEGATIVE mg/dL

## 2012-06-04 LAB — BASIC METABOLIC PANEL
BUN: 8 mg/dL (ref 6–23)
CO2: 18 mEq/L — ABNORMAL LOW (ref 19–32)
Chloride: 104 mEq/L (ref 96–112)
Creatinine, Ser: 0.7 mg/dL (ref 0.50–1.10)

## 2012-06-04 LAB — URINE MICROSCOPIC-ADD ON

## 2012-06-04 MED ORDER — POTASSIUM CHLORIDE ER 10 MEQ PO TBCR: 20.0000 meq | EXTENDED_RELEASE_TABLET | Freq: Two times a day (BID) | ORAL | Status: DC

## 2012-06-04 MED ORDER — PROMETHAZINE HCL 25 MG/ML IJ SOLN
12.5000 mg | Freq: Once | INTRAMUSCULAR | Status: AC
  Administered 2012-06-04: 12.5 mg via INTRAVENOUS

## 2012-06-04 MED ORDER — POTASSIUM CHLORIDE CRYS ER 20 MEQ PO TBCR
40.0000 meq | EXTENDED_RELEASE_TABLET | Freq: Once | ORAL | Status: AC
  Administered 2012-06-04: 40 meq via ORAL
  Filled 2012-06-04: qty 2

## 2012-06-04 MED ORDER — PROMETHAZINE HCL 25 MG PO TABS: 25.0000 mg | ORAL_TABLET | Freq: Four times a day (QID) | ORAL | Status: DC | PRN

## 2012-06-04 MED ORDER — SODIUM CHLORIDE 0.9 % IV BOLUS (SEPSIS)
1000.0000 mL | Freq: Once | INTRAVENOUS | Status: AC
  Administered 2012-06-04: 1000 mL via INTRAVENOUS

## 2012-06-04 MED ORDER — HYDROMORPHONE HCL PF 1 MG/ML IJ SOLN
1.0000 mg | Freq: Once | INTRAMUSCULAR | Status: AC
  Administered 2012-06-04: 1 mg via INTRAVENOUS
  Filled 2012-06-04: qty 1

## 2012-06-04 MED ORDER — PROMETHAZINE HCL 25 MG/ML IJ SOLN
12.5000 mg | Freq: Once | INTRAMUSCULAR | Status: AC
  Administered 2012-06-04: 12.5 mg via INTRAVENOUS
  Filled 2012-06-04: qty 1

## 2012-06-04 MED ORDER — KETOROLAC TROMETHAMINE 30 MG/ML IJ SOLN
30.0000 mg | Freq: Once | INTRAMUSCULAR | Status: AC
  Administered 2012-06-04: 30 mg via INTRAVENOUS
  Filled 2012-06-04: qty 1

## 2012-06-04 MED ORDER — PROMETHAZINE HCL 25 MG/ML IJ SOLN
INTRAMUSCULAR | Status: AC
  Administered 2012-06-04: 12.5 mg via INTRAVENOUS
  Filled 2012-06-04: qty 1

## 2012-06-04 NOTE — ED Provider Notes (Signed)
History     CSN: [id number]  Arrival date & time 06/04/12  4166   First MD Initiated Contact with Patient 06/04/12 1946      Chief Complaint  Patient presents with  . Emesis    (Consider location/radiation/quality/duration/timing/severity/associated sxs/prior treatment) HPI Comments: Pt states that she has a history of cyclic vomiting and this is similar:pt states that she is unable to keep down her medications  Patient is a 26 y.o. female presenting with vomiting. The history is provided by the patient. No language interpreter was used.  Emesis  This is a chronic problem. The current episode started more than 2 days ago. The problem occurs more than 10 times per day. The problem has not changed since onset.The emesis has an appearance of bilious material. There has been no fever. Associated symptoms include abdominal pain and diarrhea. Pertinent negatives include no fever.    Past Medical History  Diagnosis Date  . Hypertension   . Cyclical vomiting     Past Surgical History  Procedure Date  . Tonsillectomy   . Tubal ligation   . Cesarean section   . Cholecystectomy   . Cesarean section   . Tubal ligation     History reviewed. No pertinent family history.  History  Substance Use Topics  . Smoking status: Never Smoker   . Smokeless tobacco: Never Used  . Alcohol Use: No    OB History    Grav Para Term Preterm Abortions TAB SAB Ect Mult Living                  Review of Systems  Constitutional: Negative for fever.  Respiratory: Negative.   Cardiovascular: Negative.   Gastrointestinal: Positive for vomiting, abdominal pain and diarrhea.    Allergies  Other; Peanut-containing drug products; Percocet; Tomato; and Zofran  Home Medications   Current Outpatient Rx  Name  Route  Sig  Dispense  Refill  . FLINTSTONES COMPLETE 60 MG PO CHEW   Oral   Chew 1 tablet by mouth daily.           Marland Kitchen DILAUDID PO   Oral   Take 100 mg by mouth 2 (two) times  daily.         Marland Kitchen LABETALOL HCL 100 MG PO TABS   Oral   Take 100 mg by mouth 2 (two) times daily.           Marland Kitchen LORAZEPAM 2 MG PO TABS   Oral   Take 2 mg by mouth at bedtime as needed. For sleep         . PROCHLORPERAZINE 25 MG RE SUPP   Rectal   Place 1 suppository (25 mg total) rectally every 12 (twelve) hours as needed for nausea.   12 suppository   0   . PROMETHAZINE HCL 25 MG RE SUPP   Rectal   Place 1 suppository (25 mg total) rectally every 6 (six) hours as needed for nausea.   12 each   0   . PROMETHAZINE HCL 25 MG RE SUPP   Rectal   Place 1 suppository (25 mg total) rectally every 6 (six) hours as needed for nausea.   12 each   0   . PROMETHAZINE HCL 25 MG RE SUPP   Rectal   Place 1 suppository (25 mg total) rectally every 6 (six) hours as needed for nausea.   20 each   0   . PROMETHAZINE HCL 25 MG RE SUPP   Rectal  Place 25 mg rectally every 6 (six) hours as needed. For nausea         . PROMETHAZINE HCL 25 MG RE SUPP   Rectal   Place 1 suppository (25 mg total) rectally every 6 (six) hours as needed for nausea.   12 each   2   . PROMETHAZINE HCL 25 MG PO TABS   Oral   Take 1 tablet (25 mg total) by mouth every 6 (six) hours as needed for nausea.   30 tablet   0   . PROMETHAZINE HCL 25 MG PO TABS   Oral   Take 1 tablet (25 mg total) by mouth every 6 (six) hours as needed for nausea.   15 tablet   0   . PROMETHAZINE HCL 25 MG PO TABS   Oral   Take 1 tablet (25 mg total) by mouth every 6 (six) hours as needed for nausea.   20 tablet   0   . PROMETHAZINE HCL 25 MG PO TABS      Take 1 tablet every 6 hours as needed for nausea and vomiting.   30 tablet   0     BP 174/119  Pulse 102  Temp 98.9 F (37.2 C) (Oral)  Resp 23  SpO2 100%  LMP 05/10/2012  Physical Exam  Nursing note and vitals reviewed. Constitutional: She is oriented to person, place, and time. She appears well-developed and well-nourished.  HENT:  Head:  Normocephalic and atraumatic.  Cardiovascular: Normal rate and regular rhythm.   Pulmonary/Chest: Effort normal and breath sounds normal.  Abdominal: Soft. There is no tenderness.  Musculoskeletal: Normal range of motion.  Neurological: She is alert and oriented to person, place, and time.  Skin: Skin is warm and dry.  Psychiatric: She has a normal mood and affect.    ED Course  Procedures (including critical care time)  Labs Reviewed  URINALYSIS, ROUTINE W REFLEX MICROSCOPIC - Abnormal; Notable for the following:    APPearance CLOUDY (*)     Hgb urine dipstick TRACE (*)     Ketones, ur 40 (*)     All other components within normal limits  BASIC METABOLIC PANEL - Abnormal; Notable for the following:    Potassium 2.7 (*)     CO2 18 (*)     Glucose, Bld 114 (*)     All other components within normal limits  URINE MICROSCOPIC-ADD ON - Abnormal; Notable for the following:    Squamous Epithelial / LPF FEW (*)     All other components within normal limits  PREGNANCY, URINE   Dg Abd Acute W/chest  06/03/2012  *RADIOLOGY REPORT*  Clinical Data: Body aches, abdominal pain, fever and chills.  ACUTE ABDOMEN SERIES (ABDOMEN 2 VIEW & CHEST 1 VIEW)  Comparison: 08/11/2010.  Findings: Frontal view of the chest shows midline trachea and normal heart size.  Lungs are clear.  No pleural fluid.  Two views of the abdomen show a relative paucity of bowel gas.  Air is seen in the stomach.  IMPRESSION: Paucity of bowel gas, nonspecific.   Original Report Authenticated By: Lorin Picket, M.D.      1. Cyclical vomiting       MDM  Pt is feeling better at this time:pt given potassium here:pt is tolerating po        Glendell Docker, NP 06/04/12 2151

## 2012-06-04 NOTE — ED Notes (Signed)
md notified of potassium, oral potassium supplements given

## 2012-06-04 NOTE — ED Notes (Signed)
Critical K+ of 2.7 called from lab. PA made aware.

## 2012-06-04 NOTE — ED Notes (Signed)
Pt called ems, nausea/vomiting continously throught the day

## 2012-06-04 NOTE — ED Notes (Signed)
Pt reports history of cyclic vomiting started 3 days ago, diarrhea started today, back and leg pain started today

## 2012-06-04 NOTE — ED Provider Notes (Signed)
Medical screening examination/treatment/procedure(s) were performed by non-physician practitioner and as supervising physician I was immediately available for consultation/collaboration.   Ezequiel Essex, MD 06/04/12 [phone]

## 2012-06-06 ENCOUNTER — Encounter (HOSPITAL_BASED_OUTPATIENT_CLINIC_OR_DEPARTMENT_OTHER): Payer: Self-pay | Admitting: *Deleted

## 2012-06-06 ENCOUNTER — Emergency Department (HOSPITAL_BASED_OUTPATIENT_CLINIC_OR_DEPARTMENT_OTHER)
Admission: EM | Admit: 2012-06-06 | Discharge: 2012-06-06 | Disposition: A | Discharge status: Home / Self Care | Payer: Self-pay | Attending: Emergency Medicine | Admitting: Emergency Medicine

## 2012-06-06 DIAGNOSIS — R197 Diarrhea, unspecified: Secondary | ICD-10-CM | POA: Insufficient documentation

## 2012-06-06 DIAGNOSIS — I10 Essential (primary) hypertension: Secondary | ICD-10-CM | POA: Insufficient documentation

## 2012-06-06 DIAGNOSIS — R109 Unspecified abdominal pain: Secondary | ICD-10-CM | POA: Insufficient documentation

## 2012-06-06 DIAGNOSIS — R748 Abnormal levels of other serum enzymes: Secondary | ICD-10-CM | POA: Insufficient documentation

## 2012-06-06 DIAGNOSIS — R1115 Cyclical vomiting syndrome unrelated to migraine: Secondary | ICD-10-CM | POA: Insufficient documentation

## 2012-06-06 DIAGNOSIS — Z79899 Other long term (current) drug therapy: Secondary | ICD-10-CM | POA: Insufficient documentation

## 2012-06-06 LAB — CBC WITH DIFFERENTIAL/PLATELET
Basophils Relative: 0 % (ref 0–1)
Eosinophils Relative: 1 % (ref 0–5)
HCT: 39 % (ref 36.0–46.0)
Hemoglobin: 13.5 g/dL (ref 12.0–15.0)
Lymphs Abs: 0.9 10*3/uL (ref 0.7–4.0)
MCH: 30.7 pg (ref 26.0–34.0)
MCV: 88.6 fL (ref 78.0–100.0)
Monocytes Absolute: 0.7 10*3/uL (ref 0.1–1.0)
Neutro Abs: 9.4 10*3/uL — ABNORMAL HIGH (ref 1.7–7.7)
RBC: 4.4 MIL/uL (ref 3.87–5.11)

## 2012-06-06 LAB — URINE MICROSCOPIC-ADD ON

## 2012-06-06 LAB — URINALYSIS, ROUTINE W REFLEX MICROSCOPIC
Bilirubin Urine: NEGATIVE
Glucose, UA: NEGATIVE mg/dL
Specific Gravity, Urine: 1.011 (ref 1.005–1.030)
Urobilinogen, UA: 0.2 mg/dL (ref 0.0–1.0)
pH: 7 (ref 5.0–8.0)

## 2012-06-06 LAB — COMPREHENSIVE METABOLIC PANEL
ALT: 11 U/L (ref 0–35)
Alkaline Phosphatase: 77 U/L (ref 39–117)
GFR calc Af Amer: >90 mL/min (ref >90)
Glucose, Bld: 112 mg/dL — ABNORMAL HIGH (ref 70–99)
Potassium: 3 mEq/L — ABNORMAL LOW (ref 3.5–5.1)
Sodium: 140 mEq/L (ref 135–145)
Total Protein: 8.2 g/dL (ref 6.0–8.3)

## 2012-06-06 MED ORDER — SODIUM CHLORIDE 0.9 % IV SOLN
1000.0000 mL | INTRAVENOUS | Status: DC
  Administered 2012-06-06: 1000 mL via INTRAVENOUS

## 2012-06-06 MED ORDER — DIPHENHYDRAMINE HCL 50 MG/ML IJ SOLN
12.5000 mg | Freq: Once | INTRAMUSCULAR | Status: AC
  Administered 2012-06-06: 12.5 mg via INTRAVENOUS
  Filled 2012-06-06: qty 1

## 2012-06-06 MED ORDER — PROMETHAZINE HCL 25 MG/ML IJ SOLN
25.0000 mg | Freq: Once | INTRAMUSCULAR | Status: AC
  Administered 2012-06-06: 25 mg via INTRAVENOUS
  Filled 2012-06-06: qty 1

## 2012-06-06 MED ORDER — SODIUM CHLORIDE 0.9 % IV SOLN
1000.0000 mL | Freq: Once | INTRAVENOUS | Status: AC
  Administered 2012-06-06: 1000 mL via INTRAVENOUS

## 2012-06-06 MED ORDER — POTASSIUM CHLORIDE CRYS ER 20 MEQ PO TBCR
EXTENDED_RELEASE_TABLET | ORAL | Status: AC
  Filled 2012-06-06: qty 1

## 2012-06-06 MED ORDER — METOCLOPRAMIDE HCL 5 MG/ML IJ SOLN
10.0000 mg | Freq: Once | INTRAMUSCULAR | Status: AC
  Administered 2012-06-06: 10 mg via INTRAVENOUS
  Filled 2012-06-06: qty 2

## 2012-06-06 MED ORDER — POTASSIUM CHLORIDE CRYS ER 20 MEQ PO TBCR
40.0000 meq | EXTENDED_RELEASE_TABLET | Freq: Once | ORAL | Status: AC
  Administered 2012-06-06: 40 meq via ORAL
  Filled 2012-06-06: qty 1

## 2012-06-06 MED ORDER — HYDROMORPHONE HCL PF 1 MG/ML IJ SOLN
1.0000 mg | Freq: Once | INTRAMUSCULAR | Status: AC
  Administered 2012-06-06: 1 mg via INTRAVENOUS
  Filled 2012-06-06: qty 1

## 2012-06-06 NOTE — ED Notes (Signed)
Patient ambulatory to the restroom with standby assistance.  Patient's gait steady.

## 2012-06-06 NOTE — ED Notes (Signed)
Patient states she has had vomiting for the last 3 days.  States she has not stopped since being seen here 2 days ago.  Patient is able to work.  Hx of same since age 26 yrs.  States she has generalized abdominal pain, back and leg pain.

## 2012-06-06 NOTE — ED Provider Notes (Signed)
History     CSN: [id number]  Arrival date & time 06/06/12  0757   First MD Initiated Contact with Patient 06/06/12 [phone]      Chief Complaint  Patient presents with  . Emesis    (Consider location/radiation/quality/duration/timing/severity/associated sxs/prior treatment) HPI Comments: The patient has a history of cyclic vomiting syndrome. She has had multiple recurrent episodes as nausea vomiting. The patient has had frequent visits to the ED unfortunately for this condition. She is having a similar episode today. She states she's been vomiting multiple times over the last couple of days. Patient states the symptoms are often related to her menstrual period. She is currently on her menses. She denies any fever. She denies any chest pain or shortness of breath. The cramping in her abdomen is diffuse.  Patient is a 26 y.o. female presenting with vomiting. The history is provided by the patient.  Emesis  This is a recurrent problem. The current episode started more than 2 days ago. The problem occurs more than 10 times per day. The problem has not changed since onset.The emesis has an appearance of stomach contents. There has been no fever. Associated symptoms include abdominal pain and diarrhea. Pertinent negatives include no arthralgias, no chills, no cough, no fever, no myalgias and no URI.    Past Medical History  Diagnosis Date  . Hypertension   . Cyclical vomiting     Past Surgical History  Procedure Date  . Tonsillectomy   . Tubal ligation   . Cesarean section   . Cholecystectomy   . Cesarean section   . Tubal ligation     No family history on file.  History  Substance Use Topics  . Smoking status: Never Smoker   . Smokeless tobacco: Never Used  . Alcohol Use: No    OB History    Grav Para Term Preterm Abortions TAB SAB Ect Mult Living                  Review of Systems  Constitutional: Negative for fever and chills.  Respiratory: Negative for cough.     Gastrointestinal: Positive for vomiting, abdominal pain and diarrhea.  Musculoskeletal: Negative for myalgias and arthralgias.  All other systems reviewed and are negative.    Allergies  Other; Peanut-containing drug products; Percocet; Tomato; and Zofran  Home Medications   Current Outpatient Rx  Name  Route  Sig  Dispense  Refill  . FLINTSTONES COMPLETE 60 MG PO CHEW   Oral   Chew 1 tablet by mouth daily.           Marland Kitchen DILAUDID PO   Oral   Take 100 mg by mouth 2 (two) times daily.         Marland Kitchen LABETALOL HCL 100 MG PO TABS   Oral   Take 100 mg by mouth 2 (two) times daily.           Marland Kitchen LORAZEPAM 2 MG PO TABS   Oral   Take 2 mg by mouth at bedtime as needed. For sleep         . POTASSIUM CHLORIDE ER 10 MEQ PO TBCR   Oral   Take 2 tablets (20 mEq total) by mouth 2 (two) times daily.   10 tablet   0   . PROCHLORPERAZINE 25 MG RE SUPP   Rectal   Place 1 suppository (25 mg total) rectally every 12 (twelve) hours as needed for nausea.   12 suppository   0   .  PROMETHAZINE HCL 25 MG RE SUPP   Rectal   Place 1 suppository (25 mg total) rectally every 6 (six) hours as needed for nausea.   12 each   0   . PROMETHAZINE HCL 25 MG RE SUPP   Rectal   Place 1 suppository (25 mg total) rectally every 6 (six) hours as needed for nausea.   12 each   0   . PROMETHAZINE HCL 25 MG RE SUPP   Rectal   Place 1 suppository (25 mg total) rectally every 6 (six) hours as needed for nausea.   20 each   0   . PROMETHAZINE HCL 25 MG RE SUPP   Rectal   Place 25 mg rectally every 6 (six) hours as needed. For nausea         . PROMETHAZINE HCL 25 MG RE SUPP   Rectal   Place 1 suppository (25 mg total) rectally every 6 (six) hours as needed for nausea.   12 each   2   . PROMETHAZINE HCL 25 MG PO TABS   Oral   Take 1 tablet (25 mg total) by mouth every 6 (six) hours as needed for nausea.   30 tablet   0   . PROMETHAZINE HCL 25 MG PO TABS   Oral   Take 1 tablet (25 mg  total) by mouth every 6 (six) hours as needed for nausea.   15 tablet   0   . PROMETHAZINE HCL 25 MG PO TABS   Oral   Take 1 tablet (25 mg total) by mouth every 6 (six) hours as needed for nausea.   20 tablet   0   . PROMETHAZINE HCL 25 MG PO TABS      Take 1 tablet every 6 hours as needed for nausea and vomiting.   30 tablet   0   . PROMETHAZINE HCL 25 MG PO TABS   Oral   Take 1 tablet (25 mg total) by mouth every 6 (six) hours as needed for nausea.   20 tablet   0     BP 175/118  Temp 98.4 F (36.9 C)  Resp 22  SpO2 100%  LMP 05/09/2012  Physical Exam  Nursing note and vitals reviewed. Constitutional: She appears well-developed and well-nourished. No distress.  HENT:  Head: Normocephalic and atraumatic.  Right Ear: External ear normal.  Left Ear: External ear normal.  Mouth/Throat: Oropharyngeal exudate present.       Dry mucous membranes  Eyes: Conjunctivae normal are normal. Right eye exhibits no discharge. Left eye exhibits no discharge. No scleral icterus.  Neck: Neck supple. No tracheal deviation present.  Cardiovascular: Normal rate, regular rhythm and intact distal pulses.   Pulmonary/Chest: Effort normal and breath sounds normal. No stridor. No respiratory distress. She has no wheezes. She has no rales.  Abdominal: Soft. She exhibits no distension and no mass. Bowel sounds are decreased. There is generalized tenderness. There is no rebound and no guarding. No hernia.  Musculoskeletal: She exhibits no edema and no tenderness.  Neurological: She is alert. She has normal strength. No sensory deficit. Cranial nerve deficit:  no gross defecits noted. She exhibits normal muscle tone. She displays no seizure activity. Coordination normal.  Skin: Skin is warm and dry. No rash noted.  Psychiatric: She has a normal mood and affect.    ED Course  Procedures (including critical care time)  Medications  0.9 %  sodium chloride infusion (1000 mL Intravenous New  Bag/Given 06/06/12 0842)  Followed by  0.9 %  sodium chloride infusion (0 mL Intravenous Stopped 06/06/12 0933)  HYDROmorphone (DILAUDID) injection 1 mg (not administered)  potassium chloride SA (K-DUR,KLOR-CON) CR tablet 40 mEq (not administered)  promethazine (PHENERGAN) injection 25 mg (25 mg Intravenous Given 06/06/12 0848)  diphenhydrAMINE (BENADRYL) injection 12.5 mg (12.5 mg Intravenous Given 06/06/12 0847)  metoCLOPramide (REGLAN) injection 10 mg (10 mg Intravenous Given 06/06/12 0918)    Labs Reviewed  COMPREHENSIVE METABOLIC PANEL - Abnormal; Notable for the following:    Potassium 3.0 (*)     Glucose, Bld 112 (*)     All other components within normal limits  LIPASE, BLOOD - Abnormal; Notable for the following:    Lipase 136 (*)     All other components within normal limits  URINALYSIS, ROUTINE W REFLEX MICROSCOPIC - Abnormal; Notable for the following:    Hgb urine dipstick LARGE (*)     All other components within normal limits  CBC WITH DIFFERENTIAL - Abnormal; Notable for the following:    WBC 11.1 (*)  WHITE COUNT CONFIRMED ON SMEAR   Neutrophils Relative 85 (*)     Lymphocytes Relative 8 (*)     Neutro Abs 9.4 (*)     All other components within normal limits  URINE MICROSCOPIC-ADD ON - Abnormal; Notable for the following:    Squamous Epithelial / LPF FEW (*)     Bacteria, UA FEW (*)     All other components within normal limits   No results found. Negative urine pregnancy test on Nov 26th  1. Cyclic vomiting syndrome   2. Elevated lipase       MDM  Pt has had numerous visits to the ED for similar symptoms.  Negative workup in the past.  She has had prior imaging tests.  Considering her multiple prior visits I do not feel additional imaging is necessary at this time.  Doubt obstruction, acute infection. Lipase is elevated which is new.   This may be a spurious result although it is possible she may have an early pancreatitis.  Pt has had her gallbladder  removed.  Will dc home with liquid diet.  Continue her prior medications.  Discussed close follow up, repeat lipase and reasons to return.       Kathalene Frames, MD 06/06/12 [phone]

## 2012-06-17 ENCOUNTER — Encounter (HOSPITAL_BASED_OUTPATIENT_CLINIC_OR_DEPARTMENT_OTHER): Payer: Self-pay | Admitting: *Deleted

## 2012-06-17 ENCOUNTER — Emergency Department (HOSPITAL_BASED_OUTPATIENT_CLINIC_OR_DEPARTMENT_OTHER)
Admission: EM | Admit: 2012-06-17 | Discharge: 2012-06-17 | Disposition: A | Discharge status: Home / Self Care | Payer: Self-pay | Attending: Emergency Medicine | Admitting: Emergency Medicine

## 2012-06-17 DIAGNOSIS — I10 Essential (primary) hypertension: Secondary | ICD-10-CM | POA: Insufficient documentation

## 2012-06-17 DIAGNOSIS — R1115 Cyclical vomiting syndrome unrelated to migraine: Secondary | ICD-10-CM | POA: Insufficient documentation

## 2012-06-17 DIAGNOSIS — Z79899 Other long term (current) drug therapy: Secondary | ICD-10-CM | POA: Insufficient documentation

## 2012-06-17 MED ORDER — PROMETHAZINE HCL 25 MG/ML IJ SOLN
25.0000 mg | Freq: Once | INTRAMUSCULAR | Status: AC
  Administered 2012-06-17: 25 mg via INTRAVENOUS
  Filled 2012-06-17: qty 1

## 2012-06-17 MED ORDER — SODIUM CHLORIDE 0.9 % IV BOLUS (SEPSIS)
1000.0000 mL | Freq: Once | INTRAVENOUS | Status: AC
  Administered 2012-06-17: 1000 mL via INTRAVENOUS

## 2012-06-17 MED ORDER — HYDROMORPHONE HCL PF 1 MG/ML IJ SOLN
1.0000 mg | Freq: Once | INTRAMUSCULAR | Status: AC
  Administered 2012-06-17: 1 mg via INTRAVENOUS
  Filled 2012-06-17: qty 1

## 2012-06-17 MED ORDER — PROMETHAZINE HCL 25 MG RE SUPP: 25.0000 mg | Freq: Four times a day (QID) | RECTAL | Status: DC | PRN

## 2012-06-17 NOTE — ED Provider Notes (Signed)
History     CSN: [id number]  Arrival date & time 06/17/12  0920   First MD Initiated Contact with Patient 06/17/12 0945      Chief Complaint  Patient presents with  . Emesis    (Consider location/radiation/quality/duration/timing/severity/associated sxs/prior treatment) The history is provided by the patient.   8BD F with h/o cyclic vomiting syndrome. She has recurrent episodes of intractable nausea and vomiting that have been occuring since she was 37ys old requiring frequent ED visits. She presents today with N/V since Saturday. Associated symptoms include lower back and BLE pain. She denies any diarrhea and states that she has not had a bowel movement since Saturday. She states that the only thing that helps relieve the pain and nausea are IV Dilaudid and Phenergan together which is why she came to the ED. She does endorse marijuana use once a week.  Past Medical History  Diagnosis Date  . Hypertension   . Cyclical vomiting     Past Surgical History  Procedure Date  . Tonsillectomy   . Tubal ligation   . Cesarean section   . Cholecystectomy   . Cesarean section   . Tubal ligation     No family history on file.  History  Substance Use Topics  . Smoking status: Never Smoker   . Smokeless tobacco: Never Used  . Alcohol Use: No    OB History    Grav Para Term Preterm Abortions TAB SAB Ect Mult Living                  Review of Systems  All other systems reviewed and are negative.    Allergies  Other; Peanut-containing drug products; Percocet; Tomato; and Zofran  Home Medications   Current Outpatient Rx  Name  Route  Sig  Dispense  Refill  . FLINTSTONES COMPLETE 60 MG PO CHEW   Oral   Chew 1 tablet by mouth daily.           Marland Kitchen DILAUDID PO   Oral   Take 100 mg by mouth 2 (two) times daily.         Marland Kitchen LABETALOL HCL 100 MG PO TABS   Oral   Take 100 mg by mouth 2 (two) times daily.           Marland Kitchen LORAZEPAM 2 MG PO TABS   Oral   Take 2 mg by  mouth at bedtime as needed. For sleep         . POTASSIUM CHLORIDE ER 10 MEQ PO TBCR   Oral   Take 2 tablets (20 mEq total) by mouth 2 (two) times daily.   10 tablet   0   . PROCHLORPERAZINE 25 MG RE SUPP   Rectal   Place 1 suppository (25 mg total) rectally every 12 (twelve) hours as needed for nausea.   12 suppository   0   . PROMETHAZINE HCL 25 MG RE SUPP   Rectal   Place 1 suppository (25 mg total) rectally every 6 (six) hours as needed for nausea.   12 each   0   . PROMETHAZINE HCL 25 MG RE SUPP   Rectal   Place 1 suppository (25 mg total) rectally every 6 (six) hours as needed for nausea.   12 each   0   . PROMETHAZINE HCL 25 MG RE SUPP   Rectal   Place 1 suppository (25 mg total) rectally every 6 (six) hours as needed for nausea.   20 each  0   . PROMETHAZINE HCL 25 MG RE SUPP   Rectal   Place 25 mg rectally every 6 (six) hours as needed. For nausea         . PROMETHAZINE HCL 25 MG RE SUPP   Rectal   Place 1 suppository (25 mg total) rectally every 6 (six) hours as needed for nausea.   12 each   2   . PROMETHAZINE HCL 25 MG PO TABS   Oral   Take 1 tablet (25 mg total) by mouth every 6 (six) hours as needed for nausea.   30 tablet   0   . PROMETHAZINE HCL 25 MG PO TABS   Oral   Take 1 tablet (25 mg total) by mouth every 6 (six) hours as needed for nausea.   15 tablet   0   . PROMETHAZINE HCL 25 MG PO TABS   Oral   Take 1 tablet (25 mg total) by mouth every 6 (six) hours as needed for nausea.   20 tablet   0   . PROMETHAZINE HCL 25 MG PO TABS      Take 1 tablet every 6 hours as needed for nausea and vomiting.   30 tablet   0   . PROMETHAZINE HCL 25 MG PO TABS   Oral   Take 1 tablet (25 mg total) by mouth every 6 (six) hours as needed for nausea.   20 tablet   0     BP 155/106  Pulse 112  Temp 98.4 F (36.9 C) (Oral)  Resp 22  SpO2 100%  LMP 06/06/2012  Physical Exam  Constitutional: She is oriented to person, place, and  time. She appears well-developed and well-nourished.  HENT:  Head: Normocephalic and atraumatic.  Eyes: EOM are normal. Pupils are equal, round, and reactive to light.  Cardiovascular: Regular rhythm.        Tachycardic  Pulmonary/Chest: Effort normal and breath sounds normal. No respiratory distress. She has no wheezes.  Abdominal: Soft. She exhibits no distension and no mass. There is tenderness (Diffuse). There is no guarding.       Grabbing stomach. Absent bowel sounds  Musculoskeletal: She exhibits no edema.  Neurological: She is alert and oriented to person, place, and time. No cranial nerve deficit.  Skin: Skin is warm and dry.  Psychiatric: She has a normal mood and affect. Her behavior is normal.    ED Course  Procedures (including critical care time)  Labs Reviewed - No data to display No results found.   No diagnosis found.    MDM   2YB F with cyclic vomiting syndrome and a history of marijuana use weekly. Possible that the marijuana use is contributing to her nausea and vomiting; due to the build up of THC causing a paradoxical effect of nausea and vomiting.  She was given 36m Dilaudid and 259mPhenergan in the ED. She was advised to abstain from marijuana use, which she said she can and will do. Discharging the pt home with phenergan suppositories.         KaOtho BellowsMD 06/17/12 1116

## 2012-06-17 NOTE — ED Notes (Signed)
EMS reports patient has [facility] and has been vomiting since Saturday night.

## 2012-06-17 NOTE — ED Provider Notes (Addendum)
I saw and evaluated the patient, reviewed the resident's note and I agree with the findings and plan.   .Face to face Exam:  General:  Awake HEENT:  Atraumatic Resp:  Normal effort Abd:  Nondistended Neuro:No focal weakness Lymph: No adenopathy  Patient was advised to stop smoking marijuana because of its association with cyclic vomiting syndrome.  She was advised if she tested positive then further treatment with.hydromorphone may not be considered   Dot Lanes, MD 06/17/12 [place], MD 06/17/12 [phone]

## 2012-06-17 NOTE — ED Notes (Signed)
Patient states she has cyclic vomiting syndrome and has been vomiting for the last 2 days.  Was recently referred to a GI specialist, and has an appointment on 06/24/12.  States vomiting is associated with bil leg pain, generalized abdominal pain and back pain, which is normal with her episodes.

## 2012-07-22 ENCOUNTER — Emergency Department (HOSPITAL_BASED_OUTPATIENT_CLINIC_OR_DEPARTMENT_OTHER)
Admission: EM | Admit: 2012-07-22 | Discharge: 2012-07-22 | Disposition: A | Discharge status: Home / Self Care | Payer: Self-pay | Attending: Emergency Medicine | Admitting: Emergency Medicine

## 2012-07-22 ENCOUNTER — Encounter (HOSPITAL_BASED_OUTPATIENT_CLINIC_OR_DEPARTMENT_OTHER): Payer: Self-pay | Admitting: Family Medicine

## 2012-07-22 DIAGNOSIS — Z79899 Other long term (current) drug therapy: Secondary | ICD-10-CM | POA: Insufficient documentation

## 2012-07-22 DIAGNOSIS — I10 Essential (primary) hypertension: Secondary | ICD-10-CM | POA: Insufficient documentation

## 2012-07-22 DIAGNOSIS — R1115 Cyclical vomiting syndrome unrelated to migraine: Secondary | ICD-10-CM | POA: Insufficient documentation

## 2012-07-22 LAB — COMPREHENSIVE METABOLIC PANEL
ALT: 6 U/L (ref 0–35)
Albumin: 4.4 g/dL (ref 3.5–5.2)
Alkaline Phosphatase: 69 U/L (ref 39–117)
BUN: 9 mg/dL (ref 6–23)
Chloride: 106 mEq/L (ref 96–112)
Glucose, Bld: 127 mg/dL — ABNORMAL HIGH (ref 70–99)
Potassium: 3.6 mEq/L (ref 3.5–5.1)
Sodium: 142 mEq/L (ref 135–145)
Total Bilirubin: 0.5 mg/dL (ref 0.3–1.2)
Total Protein: 7.7 g/dL (ref 6.0–8.3)

## 2012-07-22 LAB — CBC WITH DIFFERENTIAL/PLATELET
Basophils Relative: 0 % (ref 0–1)
Hemoglobin: 13.3 g/dL (ref 12.0–15.0)
Lymphs Abs: 1.5 10*3/uL (ref 0.7–4.0)
Monocytes Relative: 6 % (ref 3–12)
Neutro Abs: 8.3 10*3/uL — ABNORMAL HIGH (ref 1.7–7.7)
Neutrophils Relative %: 79 % — ABNORMAL HIGH (ref 43–77)
Platelets: 266 10*3/uL (ref 150–400)
RBC: 4.28 MIL/uL (ref 3.87–5.11)

## 2012-07-22 MED ORDER — PROMETHAZINE HCL 25 MG RE SUPP: 25.0000 mg | Freq: Four times a day (QID) | RECTAL | Status: DC | PRN

## 2012-07-22 MED ORDER — METOCLOPRAMIDE HCL 5 MG/ML IJ SOLN
10.0000 mg | Freq: Once | INTRAMUSCULAR | Status: AC
  Administered 2012-07-22: 10 mg via INTRAVENOUS
  Filled 2012-07-22: qty 2

## 2012-07-22 MED ORDER — SODIUM CHLORIDE 0.9 % IV BOLUS (SEPSIS)
1000.0000 mL | Freq: Once | INTRAVENOUS | Status: AC
  Administered 2012-07-22: 1000 mL via INTRAVENOUS

## 2012-07-22 MED ORDER — HYDROMORPHONE HCL PF 1 MG/ML IJ SOLN
1.0000 mg | Freq: Once | INTRAMUSCULAR | Status: AC
  Administered 2012-07-22: 1 mg via INTRAVENOUS
  Filled 2012-07-22: qty 1

## 2012-07-22 MED ORDER — PROMETHAZINE HCL 25 MG/ML IJ SOLN: 12.5000 mg | Freq: Once | INTRAMUSCULAR | Status: DC

## 2012-07-22 MED ORDER — PROMETHAZINE HCL 25 MG/ML IJ SOLN
25.0000 mg | Freq: Once | INTRAMUSCULAR | Status: AC
  Administered 2012-07-22: 25 mg via INTRAVENOUS
  Filled 2012-07-22: qty 1

## 2012-07-22 MED ORDER — PROMETHAZINE HCL 25 MG/ML IJ SOLN
12.5000 mg | Freq: Once | INTRAMUSCULAR | Status: AC
  Administered 2012-07-22: 12.5 mg via INTRAVENOUS
  Filled 2012-07-22: qty 1

## 2012-07-22 NOTE — ED Notes (Signed)
Pt c/o vomiting x 3 days and sts she has h/o cyclical vomiting.

## 2012-07-22 NOTE — ED Provider Notes (Signed)
History     CSN: [id number]  Arrival date & time 07/22/12  0233   First MD Initiated Contact with Patient 07/22/12 0930      Chief Complaint  Patient presents with  . Emesis    (Consider location/radiation/quality/duration/timing/severity/associated sxs/prior treatment) HPI Comments: Patient has a history of cyclical vomiting syndrome and presents with a flareup of her usual symptoms. She states it started 2-3 days ago with abdominal pain, vomiting, diarrhea. She denies he fevers or chills. She denies any blood in her stool her or her emesis. She's been using Phenergan suppositories without relief. She denies any unusual symptoms. [facility] She states her symptoms have been worsening over last 2 days.  Patient is a 27 y.o. female presenting with vomiting.  Emesis  Associated symptoms include abdominal pain and diarrhea. Pertinent negatives include no arthralgias, no chills, no cough, no fever and no headaches.    Past Medical History  Diagnosis Date  . Hypertension   . Cyclical vomiting     Past Surgical History  Procedure Date  . Tonsillectomy   . Tubal ligation   . Cesarean section   . Cholecystectomy   . Cesarean section   . Tubal ligation     No family history on file.  History  Substance Use Topics  . Smoking status: Never Smoker   . Smokeless tobacco: Never Used  . Alcohol Use: No    OB History    Grav Para Term Preterm Abortions TAB SAB Ect Mult Living                  Review of Systems  Constitutional: Positive for fatigue. Negative for fever, chills and diaphoresis.  HENT: Negative for congestion, rhinorrhea and sneezing.   Eyes: Negative.   Respiratory: Negative for cough, chest tightness and shortness of breath.   Cardiovascular: Negative for chest pain and leg swelling.  Gastrointestinal: Positive for nausea, vomiting, abdominal pain and diarrhea. Negative for blood in stool.  Genitourinary: Negative for frequency,  hematuria, flank pain and difficulty urinating.  Musculoskeletal: Negative for back pain and arthralgias.  Skin: Negative for rash.  Neurological: Negative for dizziness, speech difficulty, weakness, numbness and headaches.    Allergies  Other; Peanut-containing drug products; Percocet; Tomato; and Zofran  Home Medications   Current Outpatient Rx  Name  Route  Sig  Dispense  Refill  . FLINTSTONES COMPLETE 60 MG PO CHEW   Oral   Chew 1 tablet by mouth daily.           Marland Kitchen DILAUDID PO   Oral   Take 100 mg by mouth 2 (two) times daily.         Marland Kitchen LABETALOL HCL 100 MG PO TABS   Oral   Take 100 mg by mouth 2 (two) times daily.           Marland Kitchen LORAZEPAM 2 MG PO TABS   Oral   Take 2 mg by mouth at bedtime as needed. For sleep         . POTASSIUM CHLORIDE ER 10 MEQ PO TBCR   Oral   Take 2 tablets (20 mEq total) by mouth 2 (two) times daily.   10 tablet   0   . PROCHLORPERAZINE 25 MG RE SUPP   Rectal   Place 1 suppository (25 mg total) rectally every 12 (twelve) hours as needed for nausea.   12 suppository   0   . PROMETHAZINE HCL 25 MG RE SUPP  Rectal   Place 1 suppository (25 mg total) rectally every 6 (six) hours as needed for nausea.   12 each   0   . PROMETHAZINE HCL 25 MG RE SUPP   Rectal   Place 1 suppository (25 mg total) rectally every 6 (six) hours as needed for nausea.   20 each   0   . PROMETHAZINE HCL 25 MG RE SUPP   Rectal   Place 25 mg rectally every 6 (six) hours as needed. For nausea         . PROMETHAZINE HCL 25 MG RE SUPP   Rectal   Place 1 suppository (25 mg total) rectally every 6 (six) hours as needed for nausea.   12 each   2   . PROMETHAZINE HCL 25 MG RE SUPP   Rectal   Place 1 suppository (25 mg total) rectally every 6 (six) hours as needed for nausea.   12 each   0   . PROMETHAZINE HCL 25 MG RE SUPP   Rectal   Place 1 suppository (25 mg total) rectally every 6 (six) hours as needed for nausea.   12 each   0   .  PROMETHAZINE HCL 25 MG PO TABS   Oral   Take 1 tablet (25 mg total) by mouth every 6 (six) hours as needed for nausea.   30 tablet   0   . PROMETHAZINE HCL 25 MG PO TABS   Oral   Take 1 tablet (25 mg total) by mouth every 6 (six) hours as needed for nausea.   15 tablet   0   . PROMETHAZINE HCL 25 MG PO TABS   Oral   Take 1 tablet (25 mg total) by mouth every 6 (six) hours as needed for nausea.   20 tablet   0   . PROMETHAZINE HCL 25 MG PO TABS      Take 1 tablet every 6 hours as needed for nausea and vomiting.   30 tablet   0   . PROMETHAZINE HCL 25 MG PO TABS   Oral   Take 1 tablet (25 mg total) by mouth every 6 (six) hours as needed for nausea.   20 tablet   0     BP 164/118  Pulse 107  Temp 98 F (36.7 C) (Oral)  Resp 24  SpO2 100%  LMP 07/08/2012  Physical Exam  Constitutional: She is oriented to person, place, and time. She appears well-developed and well-nourished.  HENT:  Head: Normocephalic and atraumatic.  Eyes: Pupils are equal, round, and reactive to light.  Neck: Normal range of motion. Neck supple.  Cardiovascular: Normal rate, regular rhythm and normal heart sounds.   Pulmonary/Chest: Effort normal and breath sounds normal. No respiratory distress. She has no wheezes. She has no rales. She exhibits no tenderness.  Abdominal: Soft. Bowel sounds are normal. There is tenderness (moderate diffuse abdominal pain). There is no rebound and no guarding.  Musculoskeletal: Normal range of motion. She exhibits no edema.  Lymphadenopathy:    She has no cervical adenopathy.  Neurological: She is alert and oriented to person, place, and time.  Skin: Skin is warm and dry. No rash noted.  Psychiatric: She has a normal mood and affect.    ED Course  Procedures (including critical care time)  Results for orders placed during the hospital encounter of 07/22/12  CBC WITH DIFFERENTIAL      Component Value Range   WBC 10.5  4.0 - 10.5 K/uL  RBC 4.28  3.87 -  5.11 MIL/uL   Hemoglobin 13.3  12.0 - 15.0 g/dL   HCT 39.0  36.0 - 46.0 %   MCV 91.1  78.0 - 100.0 fL   MCH 31.1  26.0 - 34.0 pg   MCHC 34.1  30.0 - 36.0 g/dL   RDW 12.9  11.5 - 15.5 %   Platelets 266  150 - 400 K/uL   Neutrophils Relative 79 (*) 43 - 77 %   Neutro Abs 8.3 (*) 1.7 - 7.7 K/uL   Lymphocytes Relative 14  12 - 46 %   Lymphs Abs 1.5  0.7 - 4.0 K/uL   Monocytes Relative 6  3 - 12 %   Monocytes Absolute 0.6  0.1 - 1.0 K/uL   Eosinophils Relative 1  0 - 5 %   Eosinophils Absolute 0.1  0.0 - 0.7 K/uL   Basophils Relative 0  0 - 1 %   Basophils Absolute 0.0  0.0 - 0.1 K/uL  COMPREHENSIVE METABOLIC PANEL      Component Value Range   Sodium 142  135 - 145 mEq/L   Potassium 3.6  3.5 - 5.1 mEq/L   Chloride 106  96 - 112 mEq/L   CO2 22  19 - 32 mEq/L   Glucose, Bld 127 (*) 70 - 99 mg/dL   BUN 9  6 - 23 mg/dL   Creatinine, Ser 0.90  0.50 - 1.10 mg/dL   Calcium 9.9  8.4 - 10.5 mg/dL   Total Protein 7.7  6.0 - 8.3 g/dL   Albumin 4.4  3.5 - 5.2 g/dL   AST 16  0 - 37 U/L   ALT 6  0 - 35 U/L   Alkaline Phosphatase 69  39 - 117 U/L   Total Bilirubin 0.5  0.3 - 1.2 mg/dL   GFR calc non Af Amer 87 (*) >90 mL/min   GFR calc Af Amer >90  >90 mL/min  LIPASE, BLOOD      Component Value Range   Lipase 28  11 - 59 U/L   No results found.   1. Cyclic vomiting syndrome       MDM  Patient was given IV fluids, Dilaudid and Phenergan for symptomatic relief and is feeling much better after that. Her heart rate is improved. Her symptoms are consistent with her past episodes. There is nothing unusual to suggest obstruction or bowel perforation. She was discharged home with a prescription for Phenergan suppositories. She was advised to follow up with her primary care physician at [facility] [place], MD 07/22/12 1329

## 2012-07-23 ENCOUNTER — Encounter (HOSPITAL_BASED_OUTPATIENT_CLINIC_OR_DEPARTMENT_OTHER): Payer: Self-pay | Admitting: *Deleted

## 2012-07-23 ENCOUNTER — Emergency Department (HOSPITAL_BASED_OUTPATIENT_CLINIC_OR_DEPARTMENT_OTHER)
Admission: EM | Admit: 2012-07-23 | Discharge: 2012-07-23 | Disposition: A | Discharge status: Home / Self Care | Payer: Self-pay | Attending: Emergency Medicine | Admitting: Emergency Medicine

## 2012-07-23 DIAGNOSIS — R1115 Cyclical vomiting syndrome unrelated to migraine: Secondary | ICD-10-CM | POA: Insufficient documentation

## 2012-07-23 DIAGNOSIS — I10 Essential (primary) hypertension: Secondary | ICD-10-CM | POA: Insufficient documentation

## 2012-07-23 DIAGNOSIS — Z79899 Other long term (current) drug therapy: Secondary | ICD-10-CM | POA: Insufficient documentation

## 2012-07-23 MED ORDER — HYDROMORPHONE HCL PF 1 MG/ML IJ SOLN
1.0000 mg | Freq: Once | INTRAMUSCULAR | Status: AC
  Administered 2012-07-23: 1 mg via INTRAVENOUS
  Filled 2012-07-23: qty 1

## 2012-07-23 MED ORDER — SODIUM CHLORIDE 0.9 % IV BOLUS (SEPSIS)
1000.0000 mL | Freq: Once | INTRAVENOUS | Status: AC
  Administered 2012-07-23: 1000 mL via INTRAVENOUS

## 2012-07-23 MED ORDER — PROMETHAZINE HCL 25 MG/ML IJ SOLN
12.5000 mg | Freq: Once | INTRAMUSCULAR | Status: AC
  Administered 2012-07-23: 12:00:00 via INTRAVENOUS
  Filled 2012-07-23: qty 1

## 2012-07-23 NOTE — ED Notes (Signed)
Pt seen here yesterday states the vomiting never stopped after she was discharged home yesterday states she feels worse today

## 2012-07-23 NOTE — ED Provider Notes (Signed)
History     CSN: [id number]  Arrival date & time 07/23/12  1035   First MD Initiated Contact with Patient 07/23/12 1134      Chief Complaint  Patient presents with  . Emesis    (Consider location/radiation/quality/duration/timing/severity/associated sxs/prior treatment) HPI Comments: Patient presents with nausea vomiting and abdominal pain. She has a history of cyclical vomiting syndrome and states that she's having the same symptoms that she typically has. She was actually here yesterday for the same thing. She states that the vomiting started again after she went home. She states that it's typical for her to have to return to the emergency department for subsequent treatment after initial evaluation. She states it typically comes in clusters. She denies any fevers. Denies any blood in her emesis or stool. She denies any urinary symptoms. She's been using her Phenergan suppository without relief.  Patient is a 27 y.o. female presenting with vomiting.  Emesis  Associated symptoms include abdominal pain. Pertinent negatives include no arthralgias, no chills, no cough, no diarrhea, no fever and no headaches.    Past Medical History  Diagnosis Date  . Hypertension   . Cyclical vomiting     Past Surgical History  Procedure Date  . Tonsillectomy   . Tubal ligation   . Cesarean section   . Cholecystectomy   . Cesarean section   . Tubal ligation     History reviewed. No pertinent family history.  History  Substance Use Topics  . Smoking status: Never Smoker   . Smokeless tobacco: Never Used  . Alcohol Use: No    OB History    Grav Para Term Preterm Abortions TAB SAB Ect Mult Living                  Review of Systems  Constitutional: Negative for fever, chills, diaphoresis and fatigue.  HENT: Negative for congestion, rhinorrhea and sneezing.   Eyes: Negative.   Respiratory: Negative for cough, chest tightness and shortness of breath.   Cardiovascular: Negative for  chest pain and leg swelling.  Gastrointestinal: Positive for nausea, vomiting and abdominal pain. Negative for diarrhea and blood in stool.  Genitourinary: Negative for frequency, hematuria, flank pain and difficulty urinating.  Musculoskeletal: Negative for back pain and arthralgias.  Skin: Negative for rash.  Neurological: Negative for dizziness, speech difficulty, weakness, numbness and headaches.    Allergies  Other; Peanut-containing drug products; Percocet; Tomato; and Zofran  Home Medications   Current Outpatient Rx  Name  Route  Sig  Dispense  Refill  . FLINTSTONES COMPLETE 60 MG PO CHEW   Oral   Chew 1 tablet by mouth daily.           Marland Kitchen DILAUDID PO   Oral   Take 100 mg by mouth 2 (two) times daily.         Marland Kitchen LABETALOL HCL 100 MG PO TABS   Oral   Take 100 mg by mouth 2 (two) times daily.           Marland Kitchen LORAZEPAM 2 MG PO TABS   Oral   Take 2 mg by mouth at bedtime as needed. For sleep         . POTASSIUM CHLORIDE ER 10 MEQ PO TBCR   Oral   Take 2 tablets (20 mEq total) by mouth 2 (two) times daily.   10 tablet   0   . PROCHLORPERAZINE 25 MG RE SUPP   Rectal   Place 1 suppository (25 mg  total) rectally every 12 (twelve) hours as needed for nausea.   12 suppository   0   . PROMETHAZINE HCL 25 MG RE SUPP   Rectal   Place 1 suppository (25 mg total) rectally every 6 (six) hours as needed for nausea.   12 each   0   . PROMETHAZINE HCL 25 MG RE SUPP   Rectal   Place 1 suppository (25 mg total) rectally every 6 (six) hours as needed for nausea.   20 each   0   . PROMETHAZINE HCL 25 MG RE SUPP   Rectal   Place 25 mg rectally every 6 (six) hours as needed. For nausea         . PROMETHAZINE HCL 25 MG RE SUPP   Rectal   Place 1 suppository (25 mg total) rectally every 6 (six) hours as needed for nausea.   12 each   2   . PROMETHAZINE HCL 25 MG RE SUPP   Rectal   Place 1 suppository (25 mg total) rectally every 6 (six) hours as needed for nausea.    12 each   0   . PROMETHAZINE HCL 25 MG RE SUPP   Rectal   Place 1 suppository (25 mg total) rectally every 6 (six) hours as needed for nausea.   12 each   0   . PROMETHAZINE HCL 25 MG PO TABS   Oral   Take 1 tablet (25 mg total) by mouth every 6 (six) hours as needed for nausea.   30 tablet   0   . PROMETHAZINE HCL 25 MG PO TABS   Oral   Take 1 tablet (25 mg total) by mouth every 6 (six) hours as needed for nausea.   15 tablet   0   . PROMETHAZINE HCL 25 MG PO TABS   Oral   Take 1 tablet (25 mg total) by mouth every 6 (six) hours as needed for nausea.   20 tablet   0   . PROMETHAZINE HCL 25 MG PO TABS      Take 1 tablet every 6 hours as needed for nausea and vomiting.   30 tablet   0   . PROMETHAZINE HCL 25 MG PO TABS   Oral   Take 1 tablet (25 mg total) by mouth every 6 (six) hours as needed for nausea.   20 tablet   0     BP 131/93  Pulse 81  Temp 98.3 F (36.8 C) (Oral)  Resp 20  Ht 5' 4"  (1.626 m)  Wt 140 lb (63.504 kg)  BMI 24.03 kg/m2  SpO2 100%  LMP 07/08/2012  Physical Exam  Constitutional: She is oriented to person, place, and time. She appears well-developed and well-nourished.  HENT:  Head: Normocephalic and atraumatic.  Eyes: Pupils are equal, round, and reactive to light.  Neck: Normal range of motion. Neck supple.  Cardiovascular: Normal rate, regular rhythm and normal heart sounds.   Pulmonary/Chest: Effort normal and breath sounds normal. No respiratory distress. She has no wheezes. She has no rales. She exhibits no tenderness.  Abdominal: Soft. Bowel sounds are normal. There is tenderness (mild TTP across upper abdomen). There is no rebound and no guarding.  Musculoskeletal: Normal range of motion. She exhibits no edema.  Lymphadenopathy:    She has no cervical adenopathy.  Neurological: She is alert and oriented to person, place, and time.  Skin: Skin is warm and dry. No rash noted.  Psychiatric: She has a normal mood  and affect.     ED Course  Procedures (including critical care time)  Labs Reviewed - No data to display No results found.   1. Cyclical vomiting syndrome       MDM  Patient presents with her typical flare up of cyclical vomiting syndrome. She denies any unusual symptoms that would be suggestive of polyp structure perforation or other intra-abdominal pathology. She was here yesterday and had lab work which was unremarkable. I did not feel that I need to be repeated today. She got one dose of IV Dilantin Phenergan and she states that she feels much better once her IV out and wants to go home. Advised her to followup with her gastroenterologist in [facility] as needed        Malvin Johns, MD 07/23/12 1313

## 2012-07-23 NOTE — ED Notes (Signed)
Pt calls desk states she is feeling better and is ready to get her IV out and go home MD notified

## 2012-07-23 NOTE — Discharge Instructions (Signed)
Cyclic Vomiting Syndrome Cyclic vomiting syndrome is a benign condition in which patients experience bouts or cycles of severe nausea and vomiting that last for hours or even days. The bouts of nausea and vomiting alternate with longer periods of no symptoms and generally good health. Cyclic vomiting syndrome occurs mostly in children, but can affect adults. CAUSES  [facility] has no known cause. Each episode is typically similar to the previous ones. The episodes tend to:   Start at about the same time of day.  Last the same length of time.  Present the same symptoms at the same level of intensity. Cyclic vomiting syndrome can begin at any age in children and adults. Cyclic vomiting syndrome usually starts between the ages of 3 and 7 years. In adults, episodes tend to occur less often than they do in children, but they last longer. Furthermore, the events or situations that trigger episodes in adults cannot always be pinpointed as easily as they can in children. There are 4 phases of cyclic vomiting syndrome: 1. Prodrome. The prodrome phase signals that an episode of nausea and vomiting is about to begin. This phase can last from just a few minutes to several hours. This phase is often marked by belly (abdominal) pain. Sometimes taking medicine early in the prodrome phase can stop an episode in progress. However, sometimes there is no warning. A person may simply wake up in the middle of the night or early morning and begin vomiting. 2. Episode. The episode phase consists of:  Severe vomiting.  Nausea.  Gagging (retching). 3. Recovery. The recovery phase begins when the nausea and vomiting stop. Healthy color, appetite, and energy return. 4. Symptom-free interval. The symptom-free interval phase is the period between episodes when no symptoms are present. TRIGGERS Episodes can be triggered by an infection or event. Examples of triggers include:  Infections.  Colds, allergies, sinus problems, and  the flu.  Eating certain foods such as chocolate or cheese.  Foods with monosodium glutamate (MSG) or preservatives.  Fast foods.  Pre-packaged foods.  Foods with low nutritional value (junk foods).  Overeating.  Eating just before going to bed.  Hot weather.  Dehydration.  Not enough sleep or poor sleep quality.  Physical exhaustion.  Menstruation.  Motion sickness.  Emotional stress (school or home difficulties).  Excitement or stress. SYMPTOMS  The main symptoms of cyclic vomiting syndrome are:  Severe vomiting.  Nausea.  Gagging (retching). Episodes usually begin at night or the first thing in the morning. Episodes may include vomiting or retching up to 5 or 6 times an hour during the worst of the episode. Episodes usually last anywhere from 1 to 4 days. Episodes can last for up to 10 days. Other symptoms include:  Paleness.  Exhaustion.  Listlessness.  Abdominal pain.  Loose stools or diarrhea. Sometimes the nausea and vomiting are so severe that a person appears to be almost unconscious. Sensitivity to light, headache, fever, dizziness, may also accompany an episode. In addition, the vomiting may cause drooling and excessive thirst. Drinking water usually leads to more vomiting, though the water can dilute the acid in the vomit, making the episode a little less painful. Continuous vomiting can lead to dehydration, which means that the body has lost excessive water and salts. DIAGNOSIS  Cyclic vomiting syndrome is hard to diagnose because there are no clear tests to identify it. A caregiver must diagnose cyclic vomiting syndrome by looking at symptoms and medical history. A caregiver must exclude more common diseases  or disorders that can also cause nausea and vomiting. Also, diagnosis takes time because caregivers need to identify a pattern or cycle to the vomiting. TREATMENT  Cyclic vomiting syndrome cannot be cured. Treatment varies, but people with  cyclic vomiting syndrome should get plenty of rest and sleep and take medications that prevent, stop, or lessen the vomiting episodes and other symptoms. People whose episodes are frequent and long-lasting may be treated during the symptom-free intervals in an effort to prevent or ease future episodes. The symptom-free phase is a good time to eliminate anything known to trigger an episode. For example, if episodes are brought on by stress or excitement, this period is the time to find ways to reduce stress and stay calm. If sinus problems or allergies cause episodes, those conditions should be treated. The triggers listed above should be avoided or prevented. Because of the similarities between migraine and cyclic vomiting syndrome, caregivers treat some people with severe cyclic vomiting syndrome with drugs that are also used for migraine headaches. The drugs are designed to:  Prevent episodes.  Reduce their frequency.  Lessen their severity. HOME CARE INSTRUCTIONS Once a vomiting episode begins, treatment is supportive. It helps to stay in bed and sleep in a dark, quiet room. Severe nausea and vomiting may require hospitalization and intravenous (IV) fluids to prevent dehydration. Relaxing medications (sedatives) may help if the nausea continues. Sometimes, during the prodrome phase, it is possible to stop an episode from happening altogether. Only take over-the-counter or prescription medicines for pain, discomfort or fever as directed by your caregiver. Do not give aspirin to children. During the recovery phase, drinking water and replacing lost electrolytes (salts in the blood) are very important. Electrolytes are salts that the body needs to function well and stay healthy. Symptoms during the recovery phase can vary. Some people find that their appetites return to normal immediately, while others need to begin by drinking clear liquids and then move slowly to solid food. RELATED COMPLICATIONS The  severe vomiting that defines cyclic vomiting syndrome is a risk factor for several complications:  Dehydration Vomiting causes the body to lose water quickly.  Electrolyte imbalance Vomiting also causes the body to lose the important salts it needs to keep working properly.  Peptic esophagitis The tube that connects the mouth to the stomach (esophagus) becomes injured from the stomach acid that comes up with the vomit.  Hematemesis The esophagus becomes irritated and bleeds, so blood mixes with the vomit.  Mallory-Weiss tear The lower end of the esophagus may tear open or the stomach may bruise from vomiting or retching.  Tooth decay The acid in the vomit can hurt the teeth by corroding the tooth enamel. SEEK MEDICAL CARE IF: You have questions or problems. Document Released: 09/04/2001 Document Revised: 09/18/2011 Document Reviewed: 10/03/2010 [facility] Patient Information 2013 [place].

## 2012-08-15 ENCOUNTER — Encounter (HOSPITAL_BASED_OUTPATIENT_CLINIC_OR_DEPARTMENT_OTHER): Payer: Self-pay | Admitting: Family Medicine

## 2012-08-15 ENCOUNTER — Emergency Department (HOSPITAL_BASED_OUTPATIENT_CLINIC_OR_DEPARTMENT_OTHER)
Admission: EM | Admit: 2012-08-15 | Discharge: 2012-08-15 | Disposition: A | Discharge status: Home / Self Care | Payer: Self-pay | Attending: Emergency Medicine | Admitting: Emergency Medicine

## 2012-08-15 DIAGNOSIS — I10 Essential (primary) hypertension: Secondary | ICD-10-CM | POA: Insufficient documentation

## 2012-08-15 DIAGNOSIS — Z79899 Other long term (current) drug therapy: Secondary | ICD-10-CM | POA: Insufficient documentation

## 2012-08-15 DIAGNOSIS — R1115 Cyclical vomiting syndrome unrelated to migraine: Secondary | ICD-10-CM | POA: Insufficient documentation

## 2012-08-15 LAB — CBC WITH DIFFERENTIAL/PLATELET
Eosinophils Absolute: 0 10*3/uL (ref 0.0–0.7)
Eosinophils Relative: 0 % (ref 0–5)
Hemoglobin: 13.7 g/dL (ref 12.0–15.0)
Lymphocytes Relative: 10 % — ABNORMAL LOW (ref 12–46)
Lymphs Abs: 1.3 10*3/uL (ref 0.7–4.0)
MCH: 31.4 pg (ref 26.0–34.0)
MCV: 90.4 fL (ref 78.0–100.0)
Monocytes Relative: 7 % (ref 3–12)
RBC: 4.36 MIL/uL (ref 3.87–5.11)

## 2012-08-15 LAB — COMPREHENSIVE METABOLIC PANEL
Alkaline Phosphatase: 77 U/L (ref 39–117)
BUN: 16 mg/dL (ref 6–23)
CO2: 22 mEq/L (ref 19–32)
Calcium: 10.1 mg/dL (ref 8.4–10.5)
GFR calc Af Amer: >90 mL/min (ref >90)
GFR calc non Af Amer: >90 mL/min (ref >90)
Glucose, Bld: 90 mg/dL (ref 70–99)
Potassium: 3.5 mEq/L (ref 3.5–5.1)
Total Protein: 8.2 g/dL (ref 6.0–8.3)

## 2012-08-15 LAB — GASTRIC OCCULT BLOOD (1-CARD TO LAB): pH, Gastric: 5

## 2012-08-15 MED ORDER — PROMETHAZINE HCL 25 MG PO TABS: 25.0000 mg | ORAL_TABLET | Freq: Four times a day (QID) | ORAL | Status: DC | PRN

## 2012-08-15 MED ORDER — PROMETHAZINE HCL 25 MG/ML IJ SOLN
25.0000 mg | Freq: Once | INTRAMUSCULAR | Status: AC
  Administered 2012-08-15: 25 mg via INTRAMUSCULAR
  Filled 2012-08-15: qty 1

## 2012-08-15 MED ORDER — LORAZEPAM 2 MG/ML IJ SOLN
2.0000 mg | Freq: Once | INTRAMUSCULAR | Status: AC
  Administered 2012-08-15: 2 mg via INTRAMUSCULAR
  Filled 2012-08-15: qty 1

## 2012-08-15 NOTE — ED Provider Notes (Signed)
History     CSN: [id number]  Arrival date & time 08/15/12  1020   First MD Initiated Contact with Patient 08/15/12 1144      Chief Complaint  Patient presents with  . Emesis    (Consider location/radiation/quality/duration/timing/severity/associated sxs/prior treatment) HPI Pt with history of Cyclic Vomiting Syndrome reports onset of vomiting yesterday while at work and continuing through today. She reports multiple episodes of emesis including some dark red vomit today. Denies any fever, no abdominal pain or diarrhea.   Past Medical History  Diagnosis Date  . Hypertension   . Cyclical vomiting     Past Surgical History  Procedure Date  . Tonsillectomy   . Tubal ligation   . Cesarean section   . Cholecystectomy   . Cesarean section   . Tubal ligation     No family history on file.  History  Substance Use Topics  . Smoking status: Never Smoker   . Smokeless tobacco: Never Used  . Alcohol Use: No    OB History    Grav Para Term Preterm Abortions TAB SAB Ect Mult Living                  Review of Systems All other systems reviewed and are negative except as noted in HPI.   Allergies  Other; Peanut-containing drug products; Percocet; Tomato; and Zofran  Home Medications   Current Outpatient Rx  Name  Route  Sig  Dispense  Refill  . FLINTSTONES COMPLETE 60 MG PO CHEW   Oral   Chew 1 tablet by mouth daily.           Marland Kitchen DILAUDID PO   Oral   Take 100 mg by mouth 2 (two) times daily.         Marland Kitchen LABETALOL HCL 100 MG PO TABS   Oral   Take 100 mg by mouth 2 (two) times daily.           Marland Kitchen LORAZEPAM 2 MG PO TABS   Oral   Take 2 mg by mouth at bedtime as needed. For sleep         . POTASSIUM CHLORIDE ER 10 MEQ PO TBCR   Oral   Take 2 tablets (20 mEq total) by mouth 2 (two) times daily.   10 tablet   0   . PROCHLORPERAZINE 25 MG RE SUPP   Rectal   Place 1 suppository (25 mg total) rectally every 12 (twelve) hours as needed for nausea.   12  suppository   0   . PROMETHAZINE HCL 25 MG RE SUPP   Rectal   Place 1 suppository (25 mg total) rectally every 6 (six) hours as needed for nausea.   12 each   0   . PROMETHAZINE HCL 25 MG RE SUPP   Rectal   Place 1 suppository (25 mg total) rectally every 6 (six) hours as needed for nausea.   20 each   0   . PROMETHAZINE HCL 25 MG RE SUPP   Rectal   Place 25 mg rectally every 6 (six) hours as needed. For nausea         . PROMETHAZINE HCL 25 MG RE SUPP   Rectal   Place 1 suppository (25 mg total) rectally every 6 (six) hours as needed for nausea.   12 each   2   . PROMETHAZINE HCL 25 MG RE SUPP   Rectal   Place 1 suppository (25 mg total) rectally every 6 (six) hours  as needed for nausea.   12 each   0   . PROMETHAZINE HCL 25 MG RE SUPP   Rectal   Place 1 suppository (25 mg total) rectally every 6 (six) hours as needed for nausea.   12 each   0   . PROMETHAZINE HCL 25 MG PO TABS   Oral   Take 1 tablet (25 mg total) by mouth every 6 (six) hours as needed for nausea.   30 tablet   0   . PROMETHAZINE HCL 25 MG PO TABS   Oral   Take 1 tablet (25 mg total) by mouth every 6 (six) hours as needed for nausea.   15 tablet   0   . PROMETHAZINE HCL 25 MG PO TABS   Oral   Take 1 tablet (25 mg total) by mouth every 6 (six) hours as needed for nausea.   20 tablet   0   . PROMETHAZINE HCL 25 MG PO TABS      Take 1 tablet every 6 hours as needed for nausea and vomiting.   30 tablet   0   . PROMETHAZINE HCL 25 MG PO TABS   Oral   Take 1 tablet (25 mg total) by mouth every 6 (six) hours as needed for nausea.   20 tablet   0     BP 143/105  Pulse 103  Temp 99.1 F (37.3 C) (Oral)  Resp 18  Ht 5' 3"  (1.6 m)  Wt 140 lb (63.504 kg)  BMI 24.80 kg/m2  SpO2 100%  LMP 07/08/2012  Physical Exam  Nursing note and vitals reviewed. Constitutional: She is oriented to person, place, and time. She appears well-developed and well-nourished.  HENT:  Head:  Normocephalic and atraumatic.  Eyes: EOM are normal. Pupils are equal, round, and reactive to light.  Neck: Normal range of motion. Neck supple.  Cardiovascular: Normal rate, normal heart sounds and intact distal pulses.   Pulmonary/Chest: Effort normal and breath sounds normal.  Abdominal: Bowel sounds are normal. She exhibits no distension. There is no tenderness.  Musculoskeletal: Normal range of motion. She exhibits no edema and no tenderness.  Neurological: She is alert and oriented to person, place, and time. She has normal strength. No cranial nerve deficit or sensory deficit.  Skin: Skin is warm and dry. No rash noted.  Psychiatric: She has a normal mood and affect.    ED Course  Procedures (including critical care time)  Labs Reviewed  CBC WITH DIFFERENTIAL - Abnormal; Notable for the following:    WBC 14.0 (*)     Neutrophils Relative 84 (*)     Neutro Abs 11.7 (*)     Lymphocytes Relative 10 (*)     All other components within normal limits  POCT GASTRIC OCCULT BLOOD - Abnormal; Notable for the following:    Occult Blood, Gastric POSITIVE (*)     All other components within normal limits  COMPREHENSIVE METABOLIC PANEL   No results found.   No diagnosis found.    MDM  Pt well known in the ED from multiple visits for same. Emesis from basin in the ED is gastroccult positive. Pt feeling better after IM Ativan and Phenergan but will check labs to ensure no significant change from previous.   Labs unremarkable. Pt tolerating PO and wants to go home. Advised to follow up with PCP.     Charles B. Karle Starch, MD 08/16/12 [id number]

## 2012-08-15 NOTE — ED Notes (Addendum)
Pt c/o n/v since x 1 day. Pt sts she "might have a virus because I was exposed at work". Pt alert and oriented, nad noted. Pt sts she is not vomiting "right now". Pt sts suppositories irritate hemorrhoids.

## 2012-08-17 ENCOUNTER — Emergency Department (HOSPITAL_BASED_OUTPATIENT_CLINIC_OR_DEPARTMENT_OTHER)
Admission: EM | Admit: 2012-08-17 | Discharge: 2012-08-17 | Disposition: A | Discharge status: Home / Self Care | Payer: Self-pay | Attending: Emergency Medicine | Admitting: Emergency Medicine

## 2012-08-17 ENCOUNTER — Encounter (HOSPITAL_BASED_OUTPATIENT_CLINIC_OR_DEPARTMENT_OTHER): Payer: Self-pay | Admitting: *Deleted

## 2012-08-17 DIAGNOSIS — Z79899 Other long term (current) drug therapy: Secondary | ICD-10-CM | POA: Insufficient documentation

## 2012-08-17 DIAGNOSIS — Z9889 Other specified postprocedural states: Secondary | ICD-10-CM | POA: Insufficient documentation

## 2012-08-17 DIAGNOSIS — G8929 Other chronic pain: Secondary | ICD-10-CM | POA: Insufficient documentation

## 2012-08-17 DIAGNOSIS — I10 Essential (primary) hypertension: Secondary | ICD-10-CM | POA: Insufficient documentation

## 2012-08-17 DIAGNOSIS — R1115 Cyclical vomiting syndrome unrelated to migraine: Secondary | ICD-10-CM | POA: Insufficient documentation

## 2012-08-17 DIAGNOSIS — Z3202 Encounter for pregnancy test, result negative: Secondary | ICD-10-CM | POA: Insufficient documentation

## 2012-08-17 DIAGNOSIS — F1210 Cannabis abuse, uncomplicated: Secondary | ICD-10-CM | POA: Insufficient documentation

## 2012-08-17 DIAGNOSIS — R1013 Epigastric pain: Secondary | ICD-10-CM | POA: Insufficient documentation

## 2012-08-17 LAB — URINALYSIS, ROUTINE W REFLEX MICROSCOPIC
Bilirubin Urine: NEGATIVE
Leukocytes, UA: NEGATIVE
Nitrite: NEGATIVE
Specific Gravity, Urine: 1.021 (ref 1.005–1.030)
Urobilinogen, UA: 0.2 mg/dL (ref 0.0–1.0)
pH: 6.5 (ref 5.0–8.0)

## 2012-08-17 LAB — URINE MICROSCOPIC-ADD ON

## 2012-08-17 LAB — RAPID URINE DRUG SCREEN, HOSP PERFORMED
Barbiturates: NOT DETECTED
Cocaine: NOT DETECTED
Tetrahydrocannabinol: POSITIVE — AB

## 2012-08-17 LAB — PREGNANCY, URINE: Preg Test, Ur: NEGATIVE

## 2012-08-17 MED ORDER — SODIUM CHLORIDE 0.9 % IV BOLUS (SEPSIS)
1000.0000 mL | Freq: Once | INTRAVENOUS | Status: AC
  Administered 2012-08-17: 1000 mL via INTRAVENOUS

## 2012-08-17 MED ORDER — LORAZEPAM 2 MG/ML IJ SOLN
1.0000 mg | Freq: Once | INTRAMUSCULAR | Status: AC
  Administered 2012-08-17: 1 mg via INTRAVENOUS
  Filled 2012-08-17: qty 1

## 2012-08-17 MED ORDER — PROMETHAZINE HCL 25 MG/ML IJ SOLN
25.0000 mg | Freq: Once | INTRAMUSCULAR | Status: AC
  Administered 2012-08-17: 25 mg via INTRAVENOUS
  Filled 2012-08-17: qty 1

## 2012-08-17 MED ORDER — METOPROLOL TARTRATE 1 MG/ML IV SOLN: 5.0000 mg | Freq: Once | INTRAVENOUS | Status: DC

## 2012-08-17 MED ORDER — FENTANYL CITRATE 0.05 MG/ML IJ SOLN
100.0000 ug | Freq: Once | INTRAMUSCULAR | Status: AC
  Administered 2012-08-17: 100 ug via INTRAVENOUS
  Filled 2012-08-17: qty 2

## 2012-08-17 MED ORDER — PROMETHAZINE HCL 25 MG RE SUPP: 25.0000 mg | RECTAL | Status: DC | PRN

## 2012-08-17 MED ORDER — SODIUM CHLORIDE 0.9 % IV SOLN
Freq: Once | INTRAVENOUS | Status: AC
  Administered 2012-08-17: 22:00:00 via INTRAVENOUS

## 2012-08-17 NOTE — ED Notes (Addendum)
Pt states she has a hx of [facility] and has been vomiting x 2 days. Seen here at that time for same.

## 2012-08-17 NOTE — ED Provider Notes (Signed)
History    This chart was scribed for Dot Lanes, MD by Ludger Nutting, ED Scribe. This patient was seen in room MH10/MH10 and the patient's care was started 6:47 PM.   CSN: [id number]  Arrival date & time 08/17/12  1807   None     Chief Complaint  Patient presents with  . Emesis     The history is provided by the patient. No language interpreter was used.    Sherry Key is a 27 y.o. female who presents to the Emergency Department complaining of chronic cyclic vomiting syndrome onset 2 days ago. She has been seen in the ED multiple times. Pt reports last smoking marijuana in the end of December 2013. States she was told it would help with her symptoms. She denies fever, chills, HA, back pain.    Pt has h/o HTN, cyclical vomiting, cholecystectomy.  Pt denies smoking and alcohol use.  Past Medical History  Diagnosis Date  . Hypertension   . Cyclical vomiting     Past Surgical History  Procedure Laterality Date  . Tonsillectomy    . Tubal ligation    . Cesarean section    . Cholecystectomy    . Cesarean section    . Tubal ligation      History reviewed. No pertinent family history.  History  Substance Use Topics  . Smoking status: Never Smoker   . Smokeless tobacco: Never Used  . Alcohol Use: No    No OB history provided.   Review of Systems A complete 10 system review of systems was obtained and all systems are negative except as noted in the HPI and PMH.    Allergies  Other; Peanut-containing drug products; Percocet; Tomato; and Zofran  Home Medications   Current Outpatient Rx  Name  Route  Sig  Dispense  Refill  . flintstones complete (FLINTSTONES) 60 MG chewable tablet   Oral   Chew 1 tablet by mouth daily.           Marland Kitchen HYDROmorphone HCl (DILAUDID PO)   Oral   Take 100 mg by mouth 2 (two) times daily.         Marland Kitchen labetalol (NORMODYNE) 100 MG tablet   Oral   Take 100 mg by mouth 2 (two) times daily.           Marland Kitchen LORazepam (ATIVAN) 2 MG  tablet   Oral   Take 2 mg by mouth at bedtime as needed. For sleep         . potassium chloride (K-DUR) 10 MEQ tablet   Oral   Take 2 tablets (20 mEq total) by mouth 2 (two) times daily.   10 tablet   0   . promethazine (PHENERGAN) 25 MG suppository   Rectal   Place 1 suppository (25 mg total) rectally every 4 (four) hours as needed for nausea.   12 each   0   . promethazine (PHENERGAN) 25 MG tablet   Oral   Take 1 tablet (25 mg total) by mouth every 6 (six) hours as needed for nausea.   30 tablet   0     BP 124/78  Pulse 92  Temp(Src) 98.7 F (37.1 C) (Oral)  Resp 20  Ht 5' 3"  (1.6 m)  Wt 140 lb (63.504 kg)  BMI 24.81 kg/m2  SpO2 100%  LMP 08/04/2012  Physical Exam  Nursing note and vitals reviewed. Constitutional: She is oriented to person, place, and time. She appears well-developed and well-nourished. She  appears distressed (Patient appears uncomfortable from nausea and vomiting).  HENT:  Head: Normocephalic and atraumatic.  Eyes: Pupils are equal, round, and reactive to light.  Neck: Normal range of motion.  Cardiovascular: Normal rate and intact distal pulses.   Pulmonary/Chest: No respiratory distress.  Abdominal: Normal appearance. She exhibits no distension. There is no tenderness. There is no rebound.  Musculoskeletal: Normal range of motion.  Neurological: She is alert and oriented to person, place, and time. No cranial nerve deficit.  Skin: Skin is warm and dry. No rash noted.  Psychiatric: She has a normal mood and affect. Her behavior is normal.    ED Course  Procedures (including critical care time) Meds ordered this encounter  Medications  . sodium chloride 0.9 % bolus 1,000 mL    Sig:   . promethazine (PHENERGAN) injection 25 mg    Sig:   . LORazepam (ATIVAN) injection 1 mg    Sig:   . fentaNYL (SUBLIMAZE) injection 100 mcg    Sig:   . 0.9 %  sodium chloride infusion    Sig:   . promethazine (PHENERGAN) 25 MG suppository    Sig:  Place 1 suppository (25 mg total) rectally every 4 (four) hours as needed for nausea.    Dispense:  12 each    Refill:  0  . DISCONTD: metoprolol (LOPRESSOR) injection 5 mg    Sig:     DIAGNOSTIC STUDIES: None performed.   COORDINATION OF CARE:  6:54 PMDiscussed treatment plan which includes IV fluids, phenergan and ativan with pt at bedside and pt agreed to plan.    Labs Reviewed  URINE RAPID DRUG SCREEN (HOSP PERFORMED) - Abnormal; Notable for the following:    Tetrahydrocannabinol POSITIVE (*)    All other components within normal limits  URINALYSIS, ROUTINE W REFLEX MICROSCOPIC - Abnormal; Notable for the following:    APPearance CLOUDY (*)    Hgb urine dipstick TRACE (*)    Ketones, ur 15 (*)    All other components within normal limits  URINE MICROSCOPIC-ADD ON - Abnormal; Notable for the following:    Squamous Epithelial / LPF FEW (*)    Bacteria, UA MANY (*)    All other components within normal limits  URINE CULTURE  PREGNANCY, URINE   No results found.   1. Marijuana abuse   2. Cyclic vomiting syndrome   3. Chronic abdominal pain       MDM  I personally performed the services described in this documentation, which was scribed in my presence. The recorded information has been reviewed and considered.    I discussed with the patient in detail that smoking marijuana made be contributing to her symptoms.  She denied smoking marijuana in the last 2 months, but her drug screen was positive for THC.  If she continues to abuse marijuana I explained to her that I would not treat her emesis and abdominal pain with narcotics anymore.   Patient stated that she had an emergency at home and had to leave prior to the treatment finishing.  Patient was feeling better and is not vomiting at time of discharge.  She was discharged with Phenergan suppositories invited to return should her symptoms return.  Dot Lanes, MD 08/17/12 2220

## 2012-08-19 LAB — URINE CULTURE

## 2012-08-20 NOTE — ED Notes (Signed)
+   Urine Chart sent to [place] office for review.

## 2012-08-25 ENCOUNTER — Telehealth (HOSPITAL_COMMUNITY): Payer: Self-pay | Admitting: Emergency Medicine

## 2012-08-25 NOTE — ED Notes (Signed)
Chart returned from [place] office. Per Evalee Jefferson PA-C, no abx indicated.

## 2012-09-08 ENCOUNTER — Emergency Department (HOSPITAL_BASED_OUTPATIENT_CLINIC_OR_DEPARTMENT_OTHER)
Admission: EM | Admit: 2012-09-08 | Discharge: 2012-09-08 | Disposition: A | Discharge status: Home / Self Care | Payer: Self-pay | Attending: Emergency Medicine | Admitting: Emergency Medicine

## 2012-09-08 ENCOUNTER — Encounter (HOSPITAL_BASED_OUTPATIENT_CLINIC_OR_DEPARTMENT_OTHER): Payer: Self-pay | Admitting: *Deleted

## 2012-09-08 DIAGNOSIS — R197 Diarrhea, unspecified: Secondary | ICD-10-CM | POA: Insufficient documentation

## 2012-09-08 DIAGNOSIS — I10 Essential (primary) hypertension: Secondary | ICD-10-CM | POA: Insufficient documentation

## 2012-09-08 DIAGNOSIS — Z79899 Other long term (current) drug therapy: Secondary | ICD-10-CM | POA: Insufficient documentation

## 2012-09-08 DIAGNOSIS — R1115 Cyclical vomiting syndrome unrelated to migraine: Secondary | ICD-10-CM | POA: Insufficient documentation

## 2012-09-08 LAB — URINALYSIS, ROUTINE W REFLEX MICROSCOPIC
Glucose, UA: NEGATIVE mg/dL
Ketones, ur: NEGATIVE mg/dL
Leukocytes, UA: NEGATIVE
Protein, ur: NEGATIVE mg/dL
Urobilinogen, UA: 0.2 mg/dL (ref 0.0–1.0)

## 2012-09-08 LAB — URINE MICROSCOPIC-ADD ON

## 2012-09-08 LAB — PREGNANCY, URINE: Preg Test, Ur: NEGATIVE

## 2012-09-08 MED ORDER — SODIUM CHLORIDE 0.9 % IV BOLUS (SEPSIS)
1000.0000 mL | Freq: Once | INTRAVENOUS | Status: AC
  Administered 2012-09-08: 1000 mL via INTRAVENOUS

## 2012-09-08 MED ORDER — PROMETHAZINE HCL 25 MG RE SUPP: 25.0000 mg | Freq: Four times a day (QID) | RECTAL | Status: DC | PRN

## 2012-09-08 MED ORDER — PROMETHAZINE HCL 25 MG/ML IJ SOLN: 25.0000 mg | Freq: Once | INTRAMUSCULAR | Status: AC

## 2012-09-08 MED ORDER — SODIUM CHLORIDE 0.9 % IV BOLUS (SEPSIS): 1000.0000 mL | Freq: Once | INTRAVENOUS | Status: DC

## 2012-09-08 MED ORDER — PROMETHAZINE HCL 25 MG/ML IJ SOLN
25.0000 mg | Freq: Once | INTRAMUSCULAR | Status: AC
  Administered 2012-09-08: 25 mg via INTRAMUSCULAR
  Filled 2012-09-08: qty 1

## 2012-09-08 MED ORDER — PROMETHAZINE HCL 25 MG/ML IJ SOLN
INTRAMUSCULAR | Status: AC
  Administered 2012-09-08: 25 mg via INTRAMUSCULAR
  Filled 2012-09-08: qty 1

## 2012-09-08 NOTE — ED Notes (Signed)
Pt was discharged- had not left the facility. Began vomiting again in waiting room. Charge RN was notified and patient was instructed to return to previous room. MD notified on patient's return.

## 2012-09-08 NOTE — ED Notes (Signed)
N/V/D since Friday

## 2012-09-08 NOTE — ED Provider Notes (Signed)
History     CSN: [id number]  Arrival date & time 09/08/12  1259   First MD Initiated Contact with Patient 09/08/12 1314      Chief Complaint  Patient presents with  . Emesis    (Consider location/radiation/quality/duration/timing/severity/associated sxs/prior treatment) HPI Comments: The patient states that she has had several days of nausea vomiting and some diarrhea. She states this is similar to her cyclic vomiting syndrome, she states that she last used marijuana several weeks ago. Her symptoms are persistent, nothing seems to make it better or worse, she has tried her oral Phenergan at home but has vomited it up. The patient repeatedly was asking for narcotic medications during the history. She states that she does have some abdominal cramping but no other pain.  Patient is a 27 y.o. female presenting with vomiting. The history is provided by the patient and medical records.  Emesis   Past Medical History  Diagnosis Date  . Hypertension   . Cyclical vomiting     Past Surgical History  Procedure Laterality Date  . Tonsillectomy    . Tubal ligation    . Cesarean section    . Cholecystectomy    . Cesarean section    . Tubal ligation      No family history on file.  History  Substance Use Topics  . Smoking status: Never Smoker   . Smokeless tobacco: Never Used  . Alcohol Use: No    OB History   Grav Para Term Preterm Abortions TAB SAB Ect Mult Living                  Review of Systems  Gastrointestinal: Positive for vomiting.  All other systems reviewed and are negative.    Allergies  Other; Peanut-containing drug products; Percocet; Tomato; and Zofran  Home Medications   Current Outpatient Rx  Name  Route  Sig  Dispense  Refill  . flintstones complete (FLINTSTONES) 60 MG chewable tablet   Oral   Chew 1 tablet by mouth daily.           Marland Kitchen HYDROmorphone HCl (DILAUDID PO)   Oral   Take 100 mg by mouth 2 (two) times daily.         Marland Kitchen labetalol  (NORMODYNE) 100 MG tablet   Oral   Take 100 mg by mouth 2 (two) times daily.           Marland Kitchen LORazepam (ATIVAN) 2 MG tablet   Oral   Take 2 mg by mouth at bedtime as needed. For sleep         . potassium chloride (K-DUR) 10 MEQ tablet   Oral   Take 2 tablets (20 mEq total) by mouth 2 (two) times daily.   10 tablet   0   . EXPIRED: promethazine (PHENERGAN) 25 MG suppository   Rectal   Place 1 suppository (25 mg total) rectally every 4 (four) hours as needed for nausea.   12 each   0   . promethazine (PHENERGAN) 25 MG suppository   Rectal   Place 1 suppository (25 mg total) rectally every 6 (six) hours as needed for nausea.   12 each   0   . promethazine (PHENERGAN) 25 MG tablet   Oral   Take 1 tablet (25 mg total) by mouth every 6 (six) hours as needed for nausea.   30 tablet   0     BP 169/120  Pulse 115  Temp(Src) 98 F (36.7 C) (Oral)  Resp 20  Ht 5' 3"  (1.6 m)  Wt 140 lb (63.504 kg)  BMI 24.81 kg/m2  SpO2 99%  LMP 09/03/2012  Physical Exam  Nursing note and vitals reviewed. Constitutional: She appears well-developed and well-nourished. No distress.  HENT:  Head: Normocephalic and atraumatic.  Mouth/Throat: Oropharynx is clear and moist. No oropharyngeal exudate.  Eyes: Conjunctivae and EOM are normal. Pupils are equal, round, and reactive to light. Right eye exhibits no discharge. Left eye exhibits no discharge. No scleral icterus.  Neck: Normal range of motion. Neck supple. No JVD present. No thyromegaly present.  Cardiovascular: Normal rate, regular rhythm, normal heart sounds and intact distal pulses.  Exam reveals no gallop and no friction rub.   No murmur heard. Pulmonary/Chest: Effort normal and breath sounds normal. No respiratory distress. She has no wheezes. She has no rales.  Abdominal: Soft. Bowel sounds are normal. She exhibits no distension and no mass. There is no tenderness.  Musculoskeletal: Normal range of motion. She exhibits no edema and  no tenderness.  Lymphadenopathy:    She has no cervical adenopathy.  Neurological: She is alert. Coordination normal.  Skin: Skin is warm and dry. No rash noted. No erythema.  Psychiatric: She has a normal mood and affect. Her behavior is normal.    ED Course  Procedures (including critical care time)  Labs Reviewed  URINALYSIS, ROUTINE W REFLEX MICROSCOPIC - Abnormal; Notable for the following:    APPearance TURBID (*)    All other components within normal limits  URINE MICROSCOPIC-ADD ON - Abnormal; Notable for the following:    Squamous Epithelial / LPF MANY (*)    Bacteria, UA FEW (*)    All other components within normal limits  PREGNANCY, URINE   No results found.   1. Cyclic vomiting syndrome       MDM  The patient does appear uncomfortable as if she is nauseated, she does have a mild tachycardia to 110 beats per minute, her abdomen is soft and minimally tender, this is nonfocal, not right lower quadrant, non-peritoneal  Urinalysis shows a normal specific gravity, no ketones, no infection.  Intramuscular Phenergan, fluids, reevaluate I have told the patient at this time that she will not be getting any intravenous narcotic medications for her vomiting symptoms.  The patient has received 2 doses of nausea medicine, intramuscular Phenergan. She has received an IV fluid bolus, she continues to request Dilaudid, this would not be given, the patient is stable for discharge, she will be given rectal Phenergan for home. She states that she has an allergy to Zofran causes a rash.    Johnna Acosta, MD 09/08/12 [phone]

## 2012-09-08 NOTE — ED Notes (Signed)
Pt was discharged. Found actively vomiting in waiting area. Charge RN notified and pt was returned to ED3.

## 2012-10-22 ENCOUNTER — Emergency Department (HOSPITAL_BASED_OUTPATIENT_CLINIC_OR_DEPARTMENT_OTHER)
Admission: EM | Admit: 2012-10-22 | Discharge: 2012-10-22 | Disposition: A | Discharge status: Home / Self Care | Payer: Self-pay | Attending: Emergency Medicine | Admitting: Emergency Medicine

## 2012-10-22 ENCOUNTER — Encounter (HOSPITAL_BASED_OUTPATIENT_CLINIC_OR_DEPARTMENT_OTHER): Payer: Self-pay | Admitting: Emergency Medicine

## 2012-10-22 DIAGNOSIS — R1115 Cyclical vomiting syndrome unrelated to migraine: Secondary | ICD-10-CM | POA: Insufficient documentation

## 2012-10-22 DIAGNOSIS — R1013 Epigastric pain: Secondary | ICD-10-CM | POA: Insufficient documentation

## 2012-10-22 DIAGNOSIS — Z3202 Encounter for pregnancy test, result negative: Secondary | ICD-10-CM | POA: Insufficient documentation

## 2012-10-22 DIAGNOSIS — Z79899 Other long term (current) drug therapy: Secondary | ICD-10-CM | POA: Insufficient documentation

## 2012-10-22 DIAGNOSIS — I10 Essential (primary) hypertension: Secondary | ICD-10-CM | POA: Insufficient documentation

## 2012-10-22 LAB — COMPREHENSIVE METABOLIC PANEL
ALT: 10 U/L (ref 0–35)
AST: 18 U/L (ref 0–37)
Albumin: 4.6 g/dL (ref 3.5–5.2)
Alkaline Phosphatase: 80 U/L (ref 39–117)
BUN: 8 mg/dL (ref 6–23)
CO2: 20 mEq/L (ref 19–32)
Calcium: 9.9 mg/dL (ref 8.4–10.5)
Chloride: 100 mEq/L (ref 96–112)
Creatinine, Ser: 0.7 mg/dL (ref 0.50–1.10)
GFR calc Af Amer: >90 mL/min (ref >90)
GFR calc non Af Amer: >90 mL/min (ref >90)
Glucose, Bld: 102 mg/dL — ABNORMAL HIGH (ref 70–99)
Potassium: 3.2 mEq/L — ABNORMAL LOW (ref 3.5–5.1)
Sodium: 137 mEq/L (ref 135–145)
Total Bilirubin: 0.9 mg/dL (ref 0.3–1.2)
Total Protein: 8.5 g/dL — ABNORMAL HIGH (ref 6.0–8.3)

## 2012-10-22 LAB — CBC WITH DIFFERENTIAL/PLATELET
Basophils Absolute: 0 10*3/uL (ref 0.0–0.1)
Basophils Relative: 0 % (ref 0–1)
Eosinophils Absolute: 0 10*3/uL (ref 0.0–0.7)
Eosinophils Relative: 0 % (ref 0–5)
HCT: 40.7 % (ref 36.0–46.0)
Hemoglobin: 14.4 g/dL (ref 12.0–15.0)
Lymphocytes Relative: 6 % — ABNORMAL LOW (ref 12–46)
Lymphs Abs: 1 10*3/uL (ref 0.7–4.0)
MCH: 31.2 pg (ref 26.0–34.0)
MCHC: 35.4 g/dL (ref 30.0–36.0)
MCV: 88.1 fL (ref 78.0–100.0)
Monocytes Absolute: 0.9 10*3/uL (ref 0.1–1.0)
Monocytes Relative: 5 % (ref 3–12)
Neutro Abs: 15.7 10*3/uL — ABNORMAL HIGH (ref 1.7–7.7)
Neutrophils Relative %: 89 % — ABNORMAL HIGH (ref 43–77)
Platelets: 268 10*3/uL (ref 150–400)
RBC: 4.62 MIL/uL (ref 3.87–5.11)
RDW: 12.4 % (ref 11.5–15.5)
WBC: 17.7 10*3/uL — ABNORMAL HIGH (ref 4.0–10.5)

## 2012-10-22 LAB — URINE MICROSCOPIC-ADD ON

## 2012-10-22 LAB — LIPASE, BLOOD: Lipase: 24 U/L (ref 11–59)

## 2012-10-22 LAB — URINALYSIS, ROUTINE W REFLEX MICROSCOPIC
Bilirubin Urine: NEGATIVE
Glucose, UA: NEGATIVE mg/dL
Specific Gravity, Urine: 1.023 (ref 1.005–1.030)
pH: 6.5 (ref 5.0–8.0)

## 2012-10-22 LAB — PREGNANCY, URINE: Preg Test, Ur: NEGATIVE

## 2012-10-22 MED ORDER — SODIUM CHLORIDE 0.9 % IV BOLUS (SEPSIS)
1000.0000 mL | Freq: Once | INTRAVENOUS | Status: AC
  Administered 2012-10-22: 1000 mL via INTRAVENOUS

## 2012-10-22 MED ORDER — PROMETHAZINE HCL 25 MG/ML IJ SOLN
12.5000 mg | Freq: Once | INTRAMUSCULAR | Status: AC
  Administered 2012-10-22: 12.5 mg via INTRAVENOUS
  Filled 2012-10-22: qty 1

## 2012-10-22 MED ORDER — HYDROMORPHONE HCL PF 1 MG/ML IJ SOLN
1.0000 mg | Freq: Once | INTRAMUSCULAR | Status: AC
  Administered 2012-10-22: 1 mg via INTRAVENOUS
  Filled 2012-10-22: qty 1

## 2012-10-22 NOTE — ED Provider Notes (Signed)
History     CSN: [id number]  Arrival date & time 10/22/12  1815   First MD Initiated Contact with Patient 10/22/12 1845      Chief Complaint  Patient presents with  . Emesis  . Abdominal Pain    (Consider location/radiation/quality/duration/timing/severity/associated sxs/prior treatment) HPI Comments: Patient with a history of cyclical vomiting syndrome presents with nausea vomiting and abdominal pain typical for her normal flareups. She states it started 3 days ago and has been worsening since then. She's been using her home medications including Phenergan without relief. She denies any fevers or unusual abdominal pain. She denies any urinary symptoms. She describes a crampy pain in her upper abdomen is nonradiating. She sees a physician at [facility] for this condition  Patient is a 27 y.o. female presenting with vomiting and abdominal pain.  Emesis Associated symptoms: abdominal pain   Associated symptoms: no arthralgias, no chills, no diarrhea and no headaches   Abdominal Pain Associated symptoms: nausea and vomiting   Associated symptoms: no chest pain, no chills, no cough, no diarrhea, no fatigue, no fever, no hematuria and no shortness of breath     Past Medical History  Diagnosis Date  . Hypertension   . Cyclical vomiting     Past Surgical History  Procedure Laterality Date  . Tonsillectomy    . Tubal ligation    . Cesarean section    . Cholecystectomy    . Cesarean section    . Tubal ligation      No family history on file.  History  Substance Use Topics  . Smoking status: Never Smoker   . Smokeless tobacco: Never Used  . Alcohol Use: No    OB History   Grav Para Term Preterm Abortions TAB SAB Ect Mult Living                  Review of Systems  Constitutional: Negative for fever, chills, diaphoresis and fatigue.  HENT: Negative for congestion, rhinorrhea and sneezing.   Eyes: Negative.   Respiratory: Negative for cough, chest tightness  and shortness of breath.   Cardiovascular: Negative for chest pain and leg swelling.  Gastrointestinal: Positive for nausea, vomiting and abdominal pain. Negative for diarrhea and blood in stool.  Genitourinary: Negative for frequency, hematuria, flank pain and difficulty urinating.  Musculoskeletal: Negative for back pain and arthralgias.  Skin: Negative for rash.  Neurological: Negative for dizziness, speech difficulty, weakness, numbness and headaches.    Allergies  Other; Peanut-containing drug products; Percocet; Tomato; and Zofran  Home Medications   Current Outpatient Rx  Name  Route  Sig  Dispense  Refill  . flintstones complete (FLINTSTONES) 60 MG chewable tablet   Oral   Chew 1 tablet by mouth daily.           Marland Kitchen HYDROmorphone HCl (DILAUDID PO)   Oral   Take 100 mg by mouth 2 (two) times daily.         Marland Kitchen labetalol (NORMODYNE) 100 MG tablet   Oral   Take 100 mg by mouth 2 (two) times daily.           Marland Kitchen LORazepam (ATIVAN) 2 MG tablet   Oral   Take 2 mg by mouth at bedtime as needed. For sleep         . potassium chloride (K-DUR) 10 MEQ tablet   Oral   Take 2 tablets (20 mEq total) by mouth 2 (two) times daily.   10 tablet   0   .  EXPIRED: promethazine (PHENERGAN) 25 MG suppository   Rectal   Place 1 suppository (25 mg total) rectally every 4 (four) hours as needed for nausea.   12 each   0   . promethazine (PHENERGAN) 25 MG suppository   Rectal   Place 1 suppository (25 mg total) rectally every 6 (six) hours as needed for nausea.   12 each   0   . promethazine (PHENERGAN) 25 MG tablet   Oral   Take 1 tablet (25 mg total) by mouth every 6 (six) hours as needed for nausea.   30 tablet   0     BP 166/93  Pulse 95  Temp(Src) 97.4 F (36.3 C) (Oral)  Resp 20  SpO2 97%  LMP 10/19/2012  Physical Exam  Constitutional: She is oriented to person, place, and time. She appears well-developed and well-nourished.  HENT:  Head: Normocephalic and  atraumatic.  Slightly dry mucous membranes  Eyes: Pupils are equal, round, and reactive to light.  Neck: Normal range of motion. Neck supple.  Cardiovascular: Normal rate, regular rhythm and normal heart sounds.   Pulmonary/Chest: Effort normal and breath sounds normal. No respiratory distress. She has no wheezes. She has no rales. She exhibits no tenderness.  Abdominal: Soft. Bowel sounds are normal. There is tenderness (mild tenderness to palpation in her epigastrium). There is no rebound and no guarding.  Musculoskeletal: Normal range of motion. She exhibits no edema.  Lymphadenopathy:    She has no cervical adenopathy.  Neurological: She is alert and oriented to person, place, and time.  Skin: Skin is warm and dry. No rash noted.  Psychiatric: She has a normal mood and affect.    ED Course  Procedures (including critical care time)  Results for orders placed during the hospital encounter of 10/22/12  CBC WITH DIFFERENTIAL      Result Value Range   WBC 17.7 (*) 4.0 - 10.5 K/uL   RBC 4.62  3.87 - 5.11 MIL/uL   Hemoglobin 14.4  12.0 - 15.0 g/dL   HCT 40.7  36.0 - 46.0 %   MCV 88.1  78.0 - 100.0 fL   MCH 31.2  26.0 - 34.0 pg   MCHC 35.4  30.0 - 36.0 g/dL   RDW 12.4  11.5 - 15.5 %   Platelets 268  150 - 400 K/uL   Neutrophils Relative 89 (*) 43 - 77 %   Neutro Abs 15.7 (*) 1.7 - 7.7 K/uL   Lymphocytes Relative 6 (*) 12 - 46 %   Lymphs Abs 1.0  0.7 - 4.0 K/uL   Monocytes Relative 5  3 - 12 %   Monocytes Absolute 0.9  0.1 - 1.0 K/uL   Eosinophils Relative 0  0 - 5 %   Eosinophils Absolute 0.0  0.0 - 0.7 K/uL   Basophils Relative 0  0 - 1 %   Basophils Absolute 0.0  0.0 - 0.1 K/uL  COMPREHENSIVE METABOLIC PANEL      Result Value Range   Sodium 137  135 - 145 mEq/L   Potassium 3.2 (*) 3.5 - 5.1 mEq/L   Chloride 100  96 - 112 mEq/L   CO2 20  19 - 32 mEq/L   Glucose, Bld 102 (*) 70 - 99 mg/dL   BUN 8  6 - 23 mg/dL   Creatinine, Ser 0.70  0.50 - 1.10 mg/dL   Calcium 9.9  8.4  - 10.5 mg/dL   Total Protein 8.5 (*) 6.0 - 8.3 g/dL  Albumin 4.6  3.5 - 5.2 g/dL   AST 18  0 - 37 U/L   ALT 10  0 - 35 U/L   Alkaline Phosphatase 80  39 - 117 U/L   Total Bilirubin 0.9  0.3 - 1.2 mg/dL   GFR calc non Af Amer >90  >90 mL/min   GFR calc Af Amer >90  >90 mL/min  LIPASE, BLOOD      Result Value Range   Lipase 24  11 - 59 U/L  URINALYSIS, ROUTINE W REFLEX MICROSCOPIC      Result Value Range   Color, Urine AMBER (*) YELLOW   APPearance CLOUDY (*) CLEAR   Specific Gravity, Urine 1.023  1.005 - 1.030   pH 6.5  5.0 - 8.0   Glucose, UA NEGATIVE  NEGATIVE mg/dL   Hgb urine dipstick LARGE (*) NEGATIVE   Bilirubin Urine NEGATIVE  NEGATIVE   Ketones, ur >80 (*) NEGATIVE mg/dL   Protein, ur 100 (*) NEGATIVE mg/dL   Urobilinogen, UA 0.2  0.0 - 1.0 mg/dL   Nitrite NEGATIVE  NEGATIVE   Leukocytes, UA NEGATIVE  NEGATIVE  PREGNANCY, URINE      Result Value Range   Preg Test, Ur NEGATIVE  NEGATIVE  URINE MICROSCOPIC-ADD ON      Result Value Range   Squamous Epithelial / LPF FEW (*) RARE   WBC, UA 0-2  <3 WBC/hpf   RBC / HPF 11-20  <3 RBC/hpf   Bacteria, UA RARE  RARE   Urine-Other MUCOUS PRESENT     No results found.    1. Cyclical vomiting syndrome       MDM  Patient with an acute flare up of her chronic cyclical vomiting syndrome. She was given Dilaudid and Phenergan and is feeling much better after this. She's also given IV fluids. She's sitting up smiling and talking and is in no apparent distress. She is tolerating by mouth fluids. She was discharged home to followup with her primary care physician who is with [facility]. She states her period is coming on which likely explains the hematuria.        Malvin Johns, MD 10/22/12 2012

## 2012-10-22 NOTE — ED Notes (Addendum)
Vomiting x4 days.  Hx of Cyclical Vomiting Syndrome. Got new rx from pmd for Dilaudid and phenergan 4 days ago but she states it did not work today. Used Phenergan suppository at 10am and it stayed in, but she has continued to vomit.

## 2012-11-11 ENCOUNTER — Encounter (HOSPITAL_BASED_OUTPATIENT_CLINIC_OR_DEPARTMENT_OTHER): Payer: Self-pay

## 2012-11-11 ENCOUNTER — Emergency Department (HOSPITAL_BASED_OUTPATIENT_CLINIC_OR_DEPARTMENT_OTHER)
Admission: EM | Admit: 2012-11-11 | Discharge: 2012-11-11 | Disposition: A | Discharge status: Home / Self Care | Payer: Self-pay | Attending: Emergency Medicine | Admitting: Emergency Medicine

## 2012-11-11 DIAGNOSIS — R197 Diarrhea, unspecified: Secondary | ICD-10-CM | POA: Insufficient documentation

## 2012-11-11 DIAGNOSIS — Z79899 Other long term (current) drug therapy: Secondary | ICD-10-CM | POA: Insufficient documentation

## 2012-11-11 DIAGNOSIS — Z3202 Encounter for pregnancy test, result negative: Secondary | ICD-10-CM | POA: Insufficient documentation

## 2012-11-11 DIAGNOSIS — R112 Nausea with vomiting, unspecified: Secondary | ICD-10-CM | POA: Insufficient documentation

## 2012-11-11 DIAGNOSIS — I10 Essential (primary) hypertension: Secondary | ICD-10-CM | POA: Insufficient documentation

## 2012-11-11 LAB — COMPREHENSIVE METABOLIC PANEL
Albumin: 4.3 g/dL (ref 3.5–5.2)
Alkaline Phosphatase: 72 U/L (ref 39–117)
BUN: 13 mg/dL (ref 6–23)
Creatinine, Ser: 0.9 mg/dL (ref 0.50–1.10)
GFR calc Af Amer: >90 mL/min (ref >90)
Glucose, Bld: 100 mg/dL — ABNORMAL HIGH (ref 70–99)
Potassium: 3.8 mEq/L (ref 3.5–5.1)
Total Protein: 7.8 g/dL (ref 6.0–8.3)

## 2012-11-11 LAB — CBC WITH DIFFERENTIAL/PLATELET
Basophils Absolute: 0 10*3/uL (ref 0.0–0.1)
Eosinophils Relative: 1 % (ref 0–5)
HCT: 42.2 % (ref 36.0–46.0)
Lymphocytes Relative: 17 % (ref 12–46)
Lymphs Abs: 1.7 10*3/uL (ref 0.7–4.0)
MCV: 90.8 fL (ref 78.0–100.0)
Monocytes Absolute: 0.8 10*3/uL (ref 0.1–1.0)
Neutro Abs: 7.4 10*3/uL (ref 1.7–7.7)
Platelets: 242 10*3/uL (ref 150–400)
RBC: 4.65 MIL/uL (ref 3.87–5.11)
RDW: 13.1 % (ref 11.5–15.5)
WBC: 9.9 10*3/uL (ref 4.0–10.5)

## 2012-11-11 LAB — LIPASE, BLOOD: Lipase: 29 U/L (ref 11–59)

## 2012-11-11 MED ORDER — HYDROMORPHONE HCL PF 1 MG/ML IJ SOLN
1.0000 mg | Freq: Once | INTRAMUSCULAR | Status: AC
  Administered 2012-11-11: 1 mg via INTRAVENOUS
  Filled 2012-11-11: qty 1

## 2012-11-11 MED ORDER — SODIUM CHLORIDE 0.9 % IV SOLN
Freq: Once | INTRAVENOUS | Status: AC
  Administered 2012-11-11: 10:00:00 via INTRAVENOUS

## 2012-11-11 MED ORDER — PROMETHAZINE HCL 25 MG/ML IJ SOLN
25.0000 mg | Freq: Once | INTRAMUSCULAR | Status: AC
  Administered 2012-11-11: 25 mg via INTRAVENOUS
  Filled 2012-11-11: qty 1

## 2012-11-11 MED ORDER — ONDANSETRON HCL 4 MG/2ML IJ SOLN: 4.0000 mg | Freq: Once | INTRAMUSCULAR | Status: DC

## 2012-11-11 MED ORDER — PROMETHAZINE HCL 25 MG PO TABS: 25.0000 mg | ORAL_TABLET | Freq: Four times a day (QID) | ORAL | Status: DC | PRN

## 2012-11-11 MED ORDER — SODIUM CHLORIDE 0.9 % IV BOLUS (SEPSIS)
1000.0000 mL | Freq: Once | INTRAVENOUS | Status: AC
  Administered 2012-11-11: 1000 mL via INTRAVENOUS

## 2012-11-11 NOTE — ED Notes (Signed)
Pt states that she cannot provide a urine specimen at this time.  Pt was just found returning from the restroom, but states that she had a BM reported to nurse as diarrhea.  Pt states that she cannot provide any urine at this time.  Will check back shortly.

## 2012-11-11 NOTE — ED Notes (Signed)
Pt reports she developed nausea, vomiting and diarrhea during the night.

## 2012-11-11 NOTE — ED Notes (Signed)
md at bedside

## 2012-11-11 NOTE — ED Notes (Signed)
Pt ambulated to the restroom again, stating that she needed to go very quickly and could provide the urine specimen

## 2012-11-11 NOTE — ED Provider Notes (Signed)
History     CSN: [id number]  Arrival date & time 11/11/12  0827   First MD Initiated Contact with Patient 11/11/12 [phone]      Chief Complaint  Patient presents with  . Nausea  . Emesis  . Diarrhea    (Consider location/radiation/quality/duration/timing/severity/associated sxs/prior Treatment)  Patient with history of cyclical vomiting syndrome since age 27 presents today with same type symptoms for 3.5 days.  Scheduled to see gi end of May.  Followed by pmd in [facility].  States seen there Friday and given phenergan and dilaudid. States usually needs iv fluids and unable to get there.   Patient is a 27 y.o. female presenting with vomiting and diarrhea. The history is provided by the patient.  Emesis Severity:  Severe Timing:  Constant Number of daily episodes:  12 Quality:  Undigested food Able to tolerate:  Liquids Progression:  Unchanged Worsened by:  Nothing tried Ineffective treatments:  Antiemetics Associated symptoms: diarrhea   Risk factors: not pregnant now, no prior abdominal surgery, no sick contacts, no suspect food intake and no travel to endemic areas   Diarrhea Associated symptoms: vomiting     Past Medical History  Diagnosis Date  . Hypertension   . Cyclical vomiting     Past Surgical History  Procedure Laterality Date  . Tonsillectomy    . Tubal ligation    . Cesarean section    . Cholecystectomy    . Cesarean section    . Tubal ligation      No family history on file.  History  Substance Use Topics  . Smoking status: Never Smoker   . Smokeless tobacco: Never Used  . Alcohol Use: No    OB History   Grav Para Term Preterm Abortions TAB SAB Ect Mult Living                  Review of Systems  Gastrointestinal: Positive for vomiting and diarrhea.  All other systems reviewed and are negative.    Allergies  Other; Peanut-containing drug products; Percocet; Tomato; and Zofran  Home Medications   Current Outpatient Rx  Name  Route   Sig  Dispense  Refill  . flintstones complete (FLINTSTONES) 60 MG chewable tablet   Oral   Chew 1 tablet by mouth daily.           Marland Kitchen HYDROmorphone HCl (DILAUDID PO)   Oral   Take 100 mg by mouth 2 (two) times daily.         Marland Kitchen labetalol (NORMODYNE) 100 MG tablet   Oral   Take 100 mg by mouth 2 (two) times daily.           Marland Kitchen LORazepam (ATIVAN) 2 MG tablet   Oral   Take 2 mg by mouth at bedtime as needed. For sleep         . potassium chloride (K-DUR) 10 MEQ tablet   Oral   Take 2 tablets (20 mEq total) by mouth 2 (two) times daily.   10 tablet   0   . EXPIRED: promethazine (PHENERGAN) 25 MG suppository   Rectal   Place 1 suppository (25 mg total) rectally every 4 (four) hours as needed for nausea.   12 each   0   . promethazine (PHENERGAN) 25 MG suppository   Rectal   Place 1 suppository (25 mg total) rectally every 6 (six) hours as needed for nausea.   12 each   0   . promethazine (PHENERGAN) 25 MG  tablet   Oral   Take 1 tablet (25 mg total) by mouth every 6 (six) hours as needed for nausea.   30 tablet   0     BP 146/99  Pulse 89  Temp(Src) 98.2 F (36.8 C) (Oral)  Resp 16  Ht 5' 3"  (1.6 m)  Wt 138 lb (62.596 kg)  BMI 24.45 kg/m2  SpO2 100%  LMP 10/19/2012  Physical Exam  Nursing note and vitals reviewed. Constitutional: She is oriented to person, place, and time. She appears well-developed and well-nourished.  HENT:  Head: Normocephalic and atraumatic.  Right Ear: External ear normal.  Left Ear: External ear normal.  Nose: Nose normal.  Mouth/Throat: Oropharynx is clear and moist.  Eyes: Conjunctivae and EOM are normal. Pupils are equal, round, and reactive to light.  Neck: Normal range of motion. Neck supple.  Cardiovascular: Normal rate, regular rhythm, normal heart sounds and intact distal pulses.   Pulmonary/Chest: Effort normal and breath sounds normal.  Abdominal: Soft. Bowel sounds are normal.  Musculoskeletal: Normal range of  motion.  Neurological: She is alert and oriented to person, place, and time. She has normal reflexes.  Skin: Skin is warm and dry.  Psychiatric: She has a normal mood and affect. Her behavior is normal. Thought content normal.    ED Course  Procedures (including critical care time)  Labs Reviewed  COMPREHENSIVE METABOLIC PANEL - Abnormal; Notable for the following:    Glucose, Bld 100 (*)    GFR calc non Af Amer 87 (*)    All other components within normal limits  PREGNANCY, URINE  CBC WITH DIFFERENTIAL  LIPASE, BLOOD   No results found.   No diagnosis found.    MDM  Patient given iv fluids and pain meds and antiemetics with improved condition and tolerating oral fluids.  Advised regarding return precautions.  Plan follow up with pmd- patient voices understanding.      Shaune Pollack, MD 11/11/12 [phone]

## 2012-11-11 NOTE — ED Notes (Signed)
Pt states nausea is improved and tolerated PO fluids without difficulty.

## 2012-12-14 ENCOUNTER — Encounter (HOSPITAL_BASED_OUTPATIENT_CLINIC_OR_DEPARTMENT_OTHER): Payer: Self-pay | Admitting: *Deleted

## 2012-12-14 ENCOUNTER — Emergency Department (HOSPITAL_BASED_OUTPATIENT_CLINIC_OR_DEPARTMENT_OTHER)
Admission: EM | Admit: 2012-12-14 | Discharge: 2012-12-14 | Disposition: A | Discharge status: Home / Self Care | Payer: Self-pay | Attending: Emergency Medicine | Admitting: Emergency Medicine

## 2012-12-14 DIAGNOSIS — R112 Nausea with vomiting, unspecified: Secondary | ICD-10-CM

## 2012-12-14 DIAGNOSIS — R197 Diarrhea, unspecified: Secondary | ICD-10-CM | POA: Insufficient documentation

## 2012-12-14 DIAGNOSIS — Z79899 Other long term (current) drug therapy: Secondary | ICD-10-CM | POA: Insufficient documentation

## 2012-12-14 DIAGNOSIS — I10 Essential (primary) hypertension: Secondary | ICD-10-CM | POA: Insufficient documentation

## 2012-12-14 DIAGNOSIS — R109 Unspecified abdominal pain: Secondary | ICD-10-CM

## 2012-12-14 DIAGNOSIS — Z3202 Encounter for pregnancy test, result negative: Secondary | ICD-10-CM | POA: Insufficient documentation

## 2012-12-14 DIAGNOSIS — Z9089 Acquired absence of other organs: Secondary | ICD-10-CM | POA: Insufficient documentation

## 2012-12-14 DIAGNOSIS — Z9851 Tubal ligation status: Secondary | ICD-10-CM | POA: Insufficient documentation

## 2012-12-14 LAB — URINALYSIS, ROUTINE W REFLEX MICROSCOPIC
Nitrite: NEGATIVE
Specific Gravity, Urine: 1.027 (ref 1.005–1.030)
Urobilinogen, UA: 1 mg/dL (ref 0.0–1.0)
pH: 7 (ref 5.0–8.0)

## 2012-12-14 LAB — PREGNANCY, URINE: Preg Test, Ur: NEGATIVE

## 2012-12-14 LAB — URINE MICROSCOPIC-ADD ON

## 2012-12-14 MED ORDER — PROMETHAZINE HCL 25 MG/ML IJ SOLN
25.0000 mg | Freq: Once | INTRAMUSCULAR | Status: AC
  Administered 2012-12-14: 25 mg via INTRAVENOUS
  Filled 2012-12-14: qty 1

## 2012-12-14 MED ORDER — HYDROMORPHONE HCL PF 1 MG/ML IJ SOLN
1.0000 mg | Freq: Once | INTRAMUSCULAR | Status: AC
  Administered 2012-12-14: 1 mg via INTRAVENOUS
  Filled 2012-12-14: qty 1

## 2012-12-14 MED ORDER — SODIUM CHLORIDE 0.9 % IV BOLUS (SEPSIS)
1000.0000 mL | Freq: Once | INTRAVENOUS | Status: AC
  Administered 2012-12-14: 1000 mL via INTRAVENOUS

## 2012-12-14 NOTE — ED Provider Notes (Signed)
History  This chart was scribed for Carmin Muskrat, MD by Jeralyn Ruths, ED Scribe. The patient was seen in room MH12/MH12 and the patient's care was started at 3:36 PM.  CSN: [id number]  Arrival date & time 12/14/12  1410  Chief Complaint  Patient presents with  . Emesis   The history is provided by the patient. No language interpreter was used.   HPI Comments: Sherry Key is a 27 y.o. female with a h/o HTN who presents to the Emergency Department complaining of sudden, moderate nausea, pain, diarrhea, and vomiting that began last last night. This is a recurring issue that the pt has been seen for in the past. The pain reportedly extends to her sides, back, and legs. Pt denies any new medications, supplements, activity change, LOC, headache, diaphoresis, fever, chills, weakness, cough, SOB and any other pain.   Past Medical History  Diagnosis Date  . Hypertension   . Cyclical vomiting     Past Surgical History  Procedure Laterality Date  . Tonsillectomy    . Tubal ligation    . Cesarean section    . Cholecystectomy    . Cesarean section    . Tubal ligation      History reviewed. No pertinent family history.  History  Substance Use Topics  . Smoking status: Never Smoker   . Smokeless tobacco: Never Used  . Alcohol Use: No    OB History   Grav Para Term Preterm Abortions TAB SAB Ect Mult Living                  Review of Systems  Constitutional:       Per HPI, otherwise negative  HENT:       Per HPI, otherwise negative  Respiratory:       Per HPI, otherwise negative  Cardiovascular:       Per HPI, otherwise negative  Gastrointestinal: Positive for nausea, vomiting and diarrhea.  Endocrine:       Negative aside from HPI  Genitourinary:       Neg aside from HPI   Musculoskeletal:       Per HPI, otherwise negative  Skin: Negative.   Neurological: Negative for syncope.  A complete 10 system review of systems was obtained and all systems are negative except  as noted in the HPI and PMH.    Allergies  Other; Peanut-containing drug products; Percocet; Tomato; and Zofran  Home Medications   Current Outpatient Rx  Name  Route  Sig  Dispense  Refill  . flintstones complete (FLINTSTONES) 60 MG chewable tablet   Oral   Chew 1 tablet by mouth daily.           Marland Kitchen HYDROmorphone HCl (DILAUDID PO)   Oral   Take 100 mg by mouth 2 (two) times daily.         Marland Kitchen labetalol (NORMODYNE) 100 MG tablet   Oral   Take 100 mg by mouth 2 (two) times daily.           Marland Kitchen LORazepam (ATIVAN) 2 MG tablet   Oral   Take 2 mg by mouth at bedtime as needed. For sleep         . potassium chloride (K-DUR) 10 MEQ tablet   Oral   Take 2 tablets (20 mEq total) by mouth 2 (two) times daily.   10 tablet   0   . EXPIRED: promethazine (PHENERGAN) 25 MG suppository   Rectal   Place 1 suppository (25  mg total) rectally every 4 (four) hours as needed for nausea.   12 each   0   . promethazine (PHENERGAN) 25 MG suppository   Rectal   Place 1 suppository (25 mg total) rectally every 6 (six) hours as needed for nausea.   12 each   0   . promethazine (PHENERGAN) 25 MG tablet   Oral   Take 1 tablet (25 mg total) by mouth every 6 (six) hours as needed for nausea.   30 tablet   0   . promethazine (PHENERGAN) 25 MG tablet   Oral   Take 1 tablet (25 mg total) by mouth every 6 (six) hours as needed for nausea.   30 tablet   0     Triage Vitals: BP 167/114  Pulse 112  Temp(Src) 98.5 F (36.9 C) (Oral)  Resp 20  Ht 5' 3"  (1.6 m)  Wt 138 lb (62.596 kg)  BMI 24.45 kg/m2  SpO2 98%  LMP 11/16/2012  Physical Exam  Nursing note and vitals reviewed. Constitutional: She is oriented to person, place, and time. She appears well-developed and well-nourished. No distress.  HENT:  Head: Normocephalic and atraumatic.  Eyes: Conjunctivae and EOM are normal.  Cardiovascular: Normal rate and regular rhythm.   Slightly tachycardic.   Pulmonary/Chest: Effort normal  and breath sounds normal. No stridor. No respiratory distress.  Abdominal: Soft. She exhibits no distension. There is tenderness. There is guarding.  Musculoskeletal: She exhibits no edema.  Neurological: She is alert and oriented to person, place, and time. No cranial nerve deficit.  Skin: Skin is warm and dry.  Psychiatric: She has a normal mood and affect.    ED Course  Procedures (including critical care time) COORDINATION OF CARE: 3:41 PM - Discussed ED treatment with pt at bedside and pt agrees.  5:06 PM - Recheck.    Labs Reviewed  URINALYSIS, ROUTINE W REFLEX MICROSCOPIC - Abnormal; Notable for the following:    APPearance CLOUDY (*)    Ketones, ur >80 (*)    Leukocytes, UA MODERATE (*)    All other components within normal limits  URINE MICROSCOPIC-ADD ON - Abnormal; Notable for the following:    Squamous Epithelial / LPF MANY (*)    Bacteria, UA MANY (*)    Casts HYALINE CASTS (*)    All other components within normal limits  PREGNANCY, URINE   No results found.   No diagnosis found.   6:41 PM Patient substantially better.  Discharge home. MDM   I personally performed the services described in this documentation, which was scribed in my presence. The recorded information has been reviewed and is accurate.   This patient with chronic abdominal pain, septal vomiting syndrome presents with a typical exacerbation.  On exam she is tachycardic, but awake and alert, appropriately interactive.  She is afebrile.  Following ED interventions, she is substantially improved.  She is discharged in stable condition, with low suspicion for occult infection or acute new pathology.   Carmin Muskrat, MD 12/14/12 [phone]

## 2012-12-14 NOTE — ED Notes (Signed)
Pt states she has vomited 3X/hr since this a.m. Hx of cyclical vomiting

## 2012-12-16 LAB — URINE CULTURE
Colony Count: NO GROWTH
Culture: NO GROWTH

## 2013-01-13 ENCOUNTER — Encounter (HOSPITAL_BASED_OUTPATIENT_CLINIC_OR_DEPARTMENT_OTHER): Payer: Self-pay | Admitting: *Deleted

## 2013-01-13 ENCOUNTER — Emergency Department (HOSPITAL_BASED_OUTPATIENT_CLINIC_OR_DEPARTMENT_OTHER)
Admission: EM | Admit: 2013-01-13 | Discharge: 2013-01-14 | Disposition: A | Discharge status: Home / Self Care | Payer: Self-pay | Attending: Emergency Medicine | Admitting: Emergency Medicine

## 2013-01-13 DIAGNOSIS — M549 Dorsalgia, unspecified: Secondary | ICD-10-CM | POA: Insufficient documentation

## 2013-01-13 DIAGNOSIS — G8929 Other chronic pain: Secondary | ICD-10-CM | POA: Insufficient documentation

## 2013-01-13 DIAGNOSIS — E860 Dehydration: Secondary | ICD-10-CM | POA: Insufficient documentation

## 2013-01-13 DIAGNOSIS — R110 Nausea: Secondary | ICD-10-CM | POA: Insufficient documentation

## 2013-01-13 DIAGNOSIS — I10 Essential (primary) hypertension: Secondary | ICD-10-CM | POA: Insufficient documentation

## 2013-01-13 DIAGNOSIS — R109 Unspecified abdominal pain: Secondary | ICD-10-CM | POA: Insufficient documentation

## 2013-01-13 DIAGNOSIS — Z3202 Encounter for pregnancy test, result negative: Secondary | ICD-10-CM | POA: Insufficient documentation

## 2013-01-13 DIAGNOSIS — Z79899 Other long term (current) drug therapy: Secondary | ICD-10-CM | POA: Insufficient documentation

## 2013-01-13 DIAGNOSIS — R1115 Cyclical vomiting syndrome unrelated to migraine: Secondary | ICD-10-CM | POA: Insufficient documentation

## 2013-01-13 DIAGNOSIS — M79609 Pain in unspecified limb: Secondary | ICD-10-CM | POA: Insufficient documentation

## 2013-01-13 LAB — COMPREHENSIVE METABOLIC PANEL
ALT: 10 U/L (ref 0–35)
Alkaline Phosphatase: 69 U/L (ref 39–117)
BUN: 9 mg/dL (ref 6–23)
CO2: 21 mEq/L (ref 19–32)
Calcium: 9.8 mg/dL (ref 8.4–10.5)
GFR calc Af Amer: >90 mL/min (ref >90)
GFR calc non Af Amer: >90 mL/min (ref >90)
Glucose, Bld: 131 mg/dL — ABNORMAL HIGH (ref 70–99)
Potassium: 3.3 mEq/L — ABNORMAL LOW (ref 3.5–5.1)
Sodium: 141 mEq/L (ref 135–145)

## 2013-01-13 LAB — CBC WITH DIFFERENTIAL/PLATELET
Eosinophils Relative: 0 % (ref 0–5)
Hemoglobin: 12.6 g/dL (ref 12.0–15.0)
Lymphocytes Relative: 7 % — ABNORMAL LOW (ref 12–46)
Lymphs Abs: 0.9 10*3/uL (ref 0.7–4.0)
MCH: 31.6 pg (ref 26.0–34.0)
MCV: 90 fL (ref 78.0–100.0)
Monocytes Relative: 3 % (ref 3–12)
Platelets: 235 10*3/uL (ref 150–400)
RBC: 3.99 MIL/uL (ref 3.87–5.11)
WBC: 13.4 10*3/uL — ABNORMAL HIGH (ref 4.0–10.5)

## 2013-01-13 LAB — URINALYSIS, ROUTINE W REFLEX MICROSCOPIC
Bilirubin Urine: NEGATIVE
Glucose, UA: NEGATIVE mg/dL
Ketones, ur: NEGATIVE mg/dL
Nitrite: NEGATIVE
Specific Gravity, Urine: 1.019 (ref 1.005–1.030)
pH: 7.5 (ref 5.0–8.0)

## 2013-01-13 LAB — LIPASE, BLOOD: Lipase: 27 U/L (ref 11–59)

## 2013-01-13 MED ORDER — PROMETHAZINE HCL 25 MG/ML IJ SOLN
12.5000 mg | Freq: Once | INTRAMUSCULAR | Status: AC
  Administered 2013-01-13: 12.5 mg via INTRAMUSCULAR
  Filled 2013-01-13: qty 1

## 2013-01-13 MED ORDER — PANTOPRAZOLE SODIUM 40 MG IV SOLR: 40.0000 mg | Freq: Once | INTRAVENOUS | Status: DC

## 2013-01-13 MED ORDER — HYDROMORPHONE HCL PF 1 MG/ML IJ SOLN
1.0000 mg | Freq: Once | INTRAMUSCULAR | Status: AC
  Administered 2013-01-13: 1 mg via INTRAMUSCULAR
  Filled 2013-01-13: qty 1

## 2013-01-13 MED ORDER — SODIUM CHLORIDE 0.9 % IV BOLUS (SEPSIS): 1000.0000 mL | Freq: Once | INTRAVENOUS | Status: DC

## 2013-01-13 MED ORDER — METOCLOPRAMIDE HCL 5 MG/ML IJ SOLN: 10.0000 mg | Freq: Once | INTRAMUSCULAR | Status: DC

## 2013-01-13 MED ORDER — SODIUM CHLORIDE 0.9 % IV SOLN: INTRAVENOUS | Status: DC

## 2013-01-13 NOTE — ED Notes (Signed)
Iv attempted x 2, once to left ac, blood return but unable to thread catheter, attempted to left ac, unsucessful

## 2013-01-13 NOTE — Progress Notes (Signed)
RT performed arterial stick to obtain labs

## 2013-01-13 NOTE — ED Provider Notes (Signed)
History    CSN: [id number] Arrival date & time 01/13/13  2042  First MD Initiated Contact with Patient 01/13/13 2106     Chief Complaint  Patient presents with  . Emesis   (Consider location/radiation/quality/duration/timing/severity/associated sxs/prior Treatment) HPI  27 year old female with history of chronic abdominal pain and cyclical vomiting presents complaining of abdominal pain and vomits. Patient reported she has a history of cervical vomits and sometimes worsen happen without any specific triggers. She has been feeling nausea and has vomited twice every hour since yesterday. Vomitus is bilious in content but nonbloody. Patient reports feeling dehydrated. She also reports having pain to her low back and her legs. States pain is chronic in nature usually worsen whenever she has her cervical vomits. She denies any significant abdominal pain. No fever, headache, chills, productive cough, chest pain, shortness of breath, dysuria, or rash. Patient is currently on her menstruation.  Past Medical History  Diagnosis Date  . Hypertension   . Cyclical vomiting    Past Surgical History  Procedure Laterality Date  . Tonsillectomy    . Tubal ligation    . Cesarean section    . Cholecystectomy    . Cesarean section    . Tubal ligation     No family history on file. History  Substance Use Topics  . Smoking status: Never Smoker   . Smokeless tobacco: Never Used  . Alcohol Use: No   OB History   Grav Para Term Preterm Abortions TAB SAB Ect Mult Living                 Review of Systems  All other systems reviewed and are negative.    Allergies  Other; Peanut-containing drug products; Percocet; Tomato; and Zofran  Home Medications   Current Outpatient Rx  Name  Route  Sig  Dispense  Refill  . flintstones complete (FLINTSTONES) 60 MG chewable tablet   Oral   Chew 1 tablet by mouth daily.           Marland Kitchen HYDROmorphone HCl (DILAUDID PO)   Oral   Take 100 mg by mouth 2  (two) times daily.         Marland Kitchen labetalol (NORMODYNE) 100 MG tablet   Oral   Take 100 mg by mouth 2 (two) times daily.           Marland Kitchen LORazepam (ATIVAN) 2 MG tablet   Oral   Take 2 mg by mouth at bedtime as needed. For sleep         . potassium chloride (K-DUR) 10 MEQ tablet   Oral   Take 2 tablets (20 mEq total) by mouth 2 (two) times daily.   10 tablet   0   . EXPIRED: promethazine (PHENERGAN) 25 MG suppository   Rectal   Place 1 suppository (25 mg total) rectally every 4 (four) hours as needed for nausea.   12 each   0   . promethazine (PHENERGAN) 25 MG suppository   Rectal   Place 1 suppository (25 mg total) rectally every 6 (six) hours as needed for nausea.   12 each   0   . promethazine (PHENERGAN) 25 MG tablet   Oral   Take 1 tablet (25 mg total) by mouth every 6 (six) hours as needed for nausea.   30 tablet   0   . promethazine (PHENERGAN) 25 MG tablet   Oral   Take 1 tablet (25 mg total) by mouth every 6 (six) hours as  needed for nausea.   30 tablet   0    BP 155/117  Pulse 110  Temp(Src) 98.1 F (36.7 C) (Oral)  Resp 18  Wt 138 lb (62.596 kg)  BMI 24.45 kg/m2  SpO2 100%  LMP 01/11/2013 Physical Exam  Nursing note and vitals reviewed. Constitutional: She appears well-developed and well-nourished. She appears distressed (pt dry-heaving).  Awake, alert, nontoxic appearance  HENT:  Head: Atraumatic.  Oral mucosa moist  Eyes: Conjunctivae are normal. Right eye exhibits no discharge. Left eye exhibits no discharge.  Neck: Neck supple.  Cardiovascular: Normal rate and regular rhythm.   Pulmonary/Chest: Effort normal. No respiratory distress. She exhibits no tenderness.  Abdominal: Soft. There is tenderness (mild epigastric tenderness, no guarding or rebound tenderness). There is no rebound.  Genitourinary:  No CVA tenderness  Musculoskeletal: She exhibits no tenderness (no significant back pain on exam).  ROM appears intact, no obvious focal  weakness  Neurological:  Mental status and motor strength appears intact  Skin: No rash noted.  Psychiatric: She has a normal mood and affect.    ED Course  Procedures (including critical care time)  12:25 AM Pt presents with c/o nausea and vomit.  Hx of same and has mult ER visits for same.  She is mildly tachycardic but unchanged from baseline.  It was difficult to established IV access despite mult tries.  Pt has mild leukocytosis. K+ 3.3.  UA shows Hgb but pt currently on menstruation, no CVA tenderness to suggest kidney stone.     After several dose of medications pt felt better, able to tolerates PO and stable for discharge.  Pt agrees to f/u with PCP for further care.  Return precaution discussed.    Labs Reviewed  URINALYSIS, ROUTINE W REFLEX MICROSCOPIC - Abnormal; Notable for the following:    APPearance CLOUDY (*)    Hgb urine dipstick MODERATE (*)    All other components within normal limits  CBC WITH DIFFERENTIAL - Abnormal; Notable for the following:    WBC 13.4 (*)    HCT 35.9 (*)    Neutrophils Relative % 90 (*)    Neutro Abs 12.0 (*)    Lymphocytes Relative 7 (*)    All other components within normal limits  COMPREHENSIVE METABOLIC PANEL - Abnormal; Notable for the following:    Potassium 3.3 (*)    Glucose, Bld 131 (*)    All other components within normal limits  URINE MICROSCOPIC-ADD ON - Abnormal; Notable for the following:    Squamous Epithelial / LPF FEW (*)    All other components within normal limits  PREGNANCY, URINE  LIPASE, BLOOD  URINE RAPID DRUG SCREEN (HOSP PERFORMED)   No results found. 1. Cyclical vomiting     MDM  BP 155/117  Pulse 110  Temp(Src) 98.1 F (36.7 C) (Oral)  Resp 18  Wt 138 lb (62.596 kg)  BMI 24.45 kg/m2  SpO2 100%  LMP 01/11/2013  Medications  sodium chloride 0.9 % bolus 1,000 mL (1,000 mLs Intravenous Not Given 01/13/13 2338)  HYDROmorphone (DILAUDID) injection 1 mg (1 mg Intramuscular Given 01/13/13 2225)   promethazine (PHENERGAN) injection 12.5 mg (12.5 mg Intramuscular Given 01/13/13 2225)  HYDROmorphone (DILAUDID) injection 1 mg (1 mg Intramuscular Given 01/13/13 2345)  promethazine (PHENERGAN) injection 12.5 mg (12.5 mg Intramuscular Given 01/13/13 2345)      Domenic Moras, PA-C 01/14/13 [id number]

## 2013-01-13 NOTE — ED Notes (Addendum)
Nausea vomiting since yesterday. States she feels dehydrated. She drove herself here.

## 2013-01-14 NOTE — ED Provider Notes (Signed)
Medical screening examination/treatment/procedure(s) were performed by non-physician practitioner and as supervising physician I was immediately available for consultation/collaboration.  Threasa Beards, MD 01/14/13 1520

## 2013-01-26 ENCOUNTER — Encounter (HOSPITAL_BASED_OUTPATIENT_CLINIC_OR_DEPARTMENT_OTHER): Payer: Self-pay | Admitting: *Deleted

## 2013-01-26 ENCOUNTER — Emergency Department (HOSPITAL_BASED_OUTPATIENT_CLINIC_OR_DEPARTMENT_OTHER)
Admission: EM | Admit: 2013-01-26 | Discharge: 2013-01-26 | Disposition: A | Discharge status: Home / Self Care | Payer: Self-pay | Attending: Emergency Medicine | Admitting: Emergency Medicine

## 2013-01-26 DIAGNOSIS — R000 Tachycardia, unspecified: Secondary | ICD-10-CM | POA: Insufficient documentation

## 2013-01-26 DIAGNOSIS — Z79899 Other long term (current) drug therapy: Secondary | ICD-10-CM | POA: Insufficient documentation

## 2013-01-26 DIAGNOSIS — R1013 Epigastric pain: Secondary | ICD-10-CM | POA: Insufficient documentation

## 2013-01-26 DIAGNOSIS — K117 Disturbances of salivary secretion: Secondary | ICD-10-CM | POA: Insufficient documentation

## 2013-01-26 DIAGNOSIS — I10 Essential (primary) hypertension: Secondary | ICD-10-CM | POA: Insufficient documentation

## 2013-01-26 DIAGNOSIS — R1115 Cyclical vomiting syndrome unrelated to migraine: Secondary | ICD-10-CM | POA: Insufficient documentation

## 2013-01-26 LAB — BASIC METABOLIC PANEL
BUN: 10 mg/dL (ref 6–23)
CO2: 23 mEq/L (ref 19–32)
Chloride: 103 mEq/L (ref 96–112)
Creatinine, Ser: 0.8 mg/dL (ref 0.50–1.10)

## 2013-01-26 LAB — CBC WITH DIFFERENTIAL/PLATELET
Basophils Absolute: 0 10*3/uL (ref 0.0–0.1)
Basophils Relative: 0 % (ref 0–1)
Eosinophils Absolute: 0.1 10*3/uL (ref 0.0–0.7)
Eosinophils Relative: 0 % (ref 0–5)
HCT: 39 % (ref 36.0–46.0)
MCH: 31.1 pg (ref 26.0–34.0)
MCHC: 33.8 g/dL (ref 30.0–36.0)
MCV: 92 fL (ref 78.0–100.0)
Monocytes Absolute: 0.8 10*3/uL (ref 0.1–1.0)
RDW: 12.5 % (ref 11.5–15.5)

## 2013-01-26 MED ORDER — PROMETHAZINE HCL 25 MG/ML IJ SOLN
25.0000 mg | Freq: Once | INTRAMUSCULAR | Status: AC
  Administered 2013-01-26: 25 mg via INTRAMUSCULAR
  Filled 2013-01-26: qty 1

## 2013-01-26 MED ORDER — PROMETHAZINE HCL 25 MG/ML IJ SOLN
25.0000 mg | Freq: Once | INTRAMUSCULAR | Status: AC
  Administered 2013-01-26: 25 mg via INTRAVENOUS
  Filled 2013-01-26: qty 1

## 2013-01-26 MED ORDER — PROMETHAZINE HCL 25 MG RE SUPP: 25.0000 mg | Freq: Four times a day (QID) | RECTAL | Status: DC | PRN

## 2013-01-26 MED ORDER — DIPHENHYDRAMINE HCL 50 MG/ML IJ SOLN
12.5000 mg | Freq: Once | INTRAMUSCULAR | Status: AC
  Administered 2013-01-26: 12.5 mg via INTRAVENOUS
  Filled 2013-01-26: qty 1

## 2013-01-26 MED ORDER — HYDROMORPHONE HCL PF 1 MG/ML IJ SOLN
1.0000 mg | Freq: Once | INTRAMUSCULAR | Status: AC
  Administered 2013-01-26: 1 mg via INTRAVENOUS
  Filled 2013-01-26: qty 1

## 2013-01-26 MED ORDER — SODIUM CHLORIDE 0.9 % IV BOLUS (SEPSIS)
1000.0000 mL | Freq: Once | INTRAVENOUS | Status: AC
  Administered 2013-01-26: 1000 mL via INTRAVENOUS

## 2013-01-26 MED ORDER — PROMETHAZINE HCL 25 MG PO TABS: 25.0000 mg | ORAL_TABLET | Freq: Four times a day (QID) | ORAL | Status: DC | PRN

## 2013-01-26 MED ORDER — HYDROMORPHONE HCL PF 2 MG/ML IJ SOLN
2.0000 mg | Freq: Once | INTRAMUSCULAR | Status: AC
  Administered 2013-01-26: 2 mg via INTRAMUSCULAR
  Filled 2013-01-26: qty 1

## 2013-01-26 NOTE — ED Notes (Addendum)
Patient with complain of abdominal pain with n/v since last night.  Patient pain is all over her stomach and c/o her legs hurting too.  Patient tried to take phenergan but she vomited the medication.

## 2013-01-26 NOTE — ED Notes (Signed)
Pt asking for benadryl for itching, md notified and at bedside speaking with pt.  Tolerating po fluids and crackers without retching or vomiting.

## 2013-01-26 NOTE — ED Notes (Signed)
MD at bedside. 

## 2013-01-26 NOTE — ED Provider Notes (Addendum)
History    This chart was scribed for Blanchie Dessert, MD by Roxan Diesel, ED scribe.  This patient was seen in room MH05/MH05 and the patient's care was started at 7:32 PM.  CSN: [id number]  Arrival date & time 01/26/13  1908    Chief Complaint  Patient presents with  . Emesis    The history is provided by the patient. No language interpreter was used.    HPI Comments: Sherry Key is a 27 y.o. female with h/o cyclical vomiting syndrome who presents to the Emergency Department complaining of emesis that began last night, with accompanying sharp, moderate, non-radiating epigastric abdominal pain.  Pt states that her symptoms are the same as her recurrent flair-ups of cyclical vomiting, although she notes they usually coincide with menstrual periods but her period has now been over "for a while."  She also notes that she ran out of her anti-emetics and pain medication 4 days ago.  Pt denies diarrhea, fever, dysuria, vaginal discharge, or any other associated symptoms.   Past Medical History  Diagnosis Date  . Hypertension   . Cyclical vomiting     Past Surgical History  Procedure Laterality Date  . Tonsillectomy    . Tubal ligation    . Cesarean section    . Cholecystectomy    . Cesarean section    . Tubal ligation      History reviewed. No pertinent family history.   History  Substance Use Topics  . Smoking status: Never Smoker   . Smokeless tobacco: Never Used  . Alcohol Use: No    OB History   Grav Para Term Preterm Abortions TAB SAB Ect Mult Living                  Review of Systems A complete 10 system review of systems was obtained and all systems are negative except as noted in the HPI and PMH.     Allergies  Other; Peanut-containing drug products; Percocet; Tomato; and Zofran  Home Medications   Current Outpatient Rx  Name  Route  Sig  Dispense  Refill  . potassium chloride (K-DUR) 10 MEQ tablet   Oral   Take 2 tablets (20 mEq total)  by mouth 2 (two) times daily.   10 tablet   0   . promethazine (PHENERGAN) 25 MG tablet   Oral   Take 1 tablet (25 mg total) by mouth every 6 (six) hours as needed for nausea.   30 tablet   0   . flintstones complete (FLINTSTONES) 60 MG chewable tablet   Oral   Chew 1 tablet by mouth daily.           Marland Kitchen HYDROmorphone HCl (DILAUDID PO)   Oral   Take 100 mg by mouth 2 (two) times daily.         Marland Kitchen labetalol (NORMODYNE) 100 MG tablet   Oral   Take 100 mg by mouth 2 (two) times daily.           Marland Kitchen LORazepam (ATIVAN) 2 MG tablet   Oral   Take 2 mg by mouth at bedtime as needed. For sleep         . EXPIRED: promethazine (PHENERGAN) 25 MG suppository   Rectal   Place 1 suppository (25 mg total) rectally every 4 (four) hours as needed for nausea.   12 each   0   . promethazine (PHENERGAN) 25 MG suppository   Rectal   Place 1 suppository (25 mg  total) rectally every 6 (six) hours as needed for nausea.   12 each   0   . promethazine (PHENERGAN) 25 MG tablet   Oral   Take 1 tablet (25 mg total) by mouth every 6 (six) hours as needed for nausea.   30 tablet   0    BP 165/129  Pulse 109  Temp(Src) 99.6 F (37.6 C) (Oral)  SpO2 100%  LMP 01/11/2013  Physical Exam  Nursing note and vitals reviewed. Constitutional: She is oriented to person, place, and time. She appears well-developed and well-nourished. No distress.  HENT:  Head: Normocephalic and atraumatic.  Mouth/Throat: Mucous membranes are dry.  Eyes: EOM are normal.  Neck: Neck supple. No tracheal deviation present.  Cardiovascular: Normal heart sounds.  Tachycardia present.   No murmur heard. Pulmonary/Chest: Effort normal. No respiratory distress.  Abdominal: There is tenderness. There is guarding. There is no rebound.  Epigastric tenderness with guarding. No RUQ tenderness.  Musculoskeletal: Normal range of motion.  Neurological: She is alert and oriented to person, place, and time.  Skin: Skin is  warm and dry.  Psychiatric: She has a normal mood and affect. Her behavior is normal.    ED Course  Procedures (including critical care time)  DIAGNOSTIC STUDIES: Oxygen Saturation is 100% on room air, normal by my interpretation.    COORDINATION OF CARE: 7:36 PM-Discussed treatment plan which includes pain medication, anti-emetics and labs with pt at bedside and pt agreed to plan.    Labs Reviewed  BASIC METABOLIC PANEL - Abnormal; Notable for the following:    Glucose, Bld 115 (*)    All other components within normal limits  CBC WITH DIFFERENTIAL - Abnormal; Notable for the following:    WBC 15.4 (*)    Neutrophils Relative % 83 (*)    Neutro Abs 12.8 (*)    Lymphocytes Relative 11 (*)    All other components within normal limits    No results found.  1. Cyclical vomiting syndrome      MDM   Patient with history of cyclical vomiting syndrome who is well known to the ER who presents today with her typical exacerbation at [facility]. It usually occurs around menses however she admits that she ran out of her Phenergan and Dilaudid which is most likely the root cause for her symptoms today. She's been out of medication for the last 3 days and her symptoms started yesterday. Patient states that her insurance no longer is covered by her current doctor and they're currently looking for another physician. Her abdominal pain is in the typical location in the epigastric and left upper quadrant area. She last had labs done 2 weeks ago which were relatively normal however will ensure that she has a normal potassium as she does tend to get hypokalemic. Patient chose to have IM medications due to being a tough stick  10:01 PM Patient's labs are stable. She feels better and her 2 doses of medications and she was discharged home with a prescription for Premarin.   I personally performed the services described in this documentation, which was scribed in my presence.  The recorded information has  been reviewed and considered.    Blanchie Dessert, MD 01/26/13 [id number]  Blanchie Dessert, MD 01/26/13 2225

## 2013-01-29 ENCOUNTER — Emergency Department (HOSPITAL_BASED_OUTPATIENT_CLINIC_OR_DEPARTMENT_OTHER)
Admission: EM | Admit: 2013-01-29 | Discharge: 2013-01-29 | Disposition: A | Discharge status: Home / Self Care | Payer: Self-pay | Attending: Emergency Medicine | Admitting: Emergency Medicine

## 2013-01-29 ENCOUNTER — Encounter (HOSPITAL_BASED_OUTPATIENT_CLINIC_OR_DEPARTMENT_OTHER): Payer: Self-pay

## 2013-01-29 DIAGNOSIS — I10 Essential (primary) hypertension: Secondary | ICD-10-CM | POA: Insufficient documentation

## 2013-01-29 DIAGNOSIS — R1115 Cyclical vomiting syndrome unrelated to migraine: Secondary | ICD-10-CM | POA: Insufficient documentation

## 2013-01-29 DIAGNOSIS — Z79899 Other long term (current) drug therapy: Secondary | ICD-10-CM | POA: Insufficient documentation

## 2013-01-29 DIAGNOSIS — R109 Unspecified abdominal pain: Secondary | ICD-10-CM | POA: Insufficient documentation

## 2013-01-29 LAB — CBC WITH DIFFERENTIAL/PLATELET
Basophils Absolute: 0 10*3/uL (ref 0.0–0.1)
Basophils Relative: 0 % (ref 0–1)
HCT: 37.9 % (ref 36.0–46.0)
MCHC: 33.2 g/dL (ref 30.0–36.0)
Monocytes Absolute: 1.1 10*3/uL — ABNORMAL HIGH (ref 0.1–1.0)
Neutro Abs: 9.4 10*3/uL — ABNORMAL HIGH (ref 1.7–7.7)
Neutrophils Relative %: 69 % (ref 43–77)
Platelets: 273 10*3/uL (ref 150–400)
RDW: 12.2 % (ref 11.5–15.5)

## 2013-01-29 LAB — COMPREHENSIVE METABOLIC PANEL
AST: 16 U/L (ref 0–37)
Albumin: 4.1 g/dL (ref 3.5–5.2)
Calcium: 10 mg/dL (ref 8.4–10.5)
Chloride: 102 mEq/L (ref 96–112)
Creatinine, Ser: 0.8 mg/dL (ref 0.50–1.10)
Total Bilirubin: 0.3 mg/dL (ref 0.3–1.2)

## 2013-01-29 MED ORDER — PROMETHAZINE HCL 25 MG PO TABS: 25.0000 mg | ORAL_TABLET | Freq: Four times a day (QID) | ORAL | Status: DC | PRN

## 2013-01-29 MED ORDER — PROMETHAZINE HCL 25 MG PO TABS: 12.5000 mg | ORAL_TABLET | Freq: Once | ORAL | Status: DC

## 2013-01-29 MED ORDER — HYDROMORPHONE HCL PF 1 MG/ML IJ SOLN
1.0000 mg | Freq: Once | INTRAMUSCULAR | Status: AC
  Administered 2013-01-29: 1 mg via INTRAVENOUS
  Filled 2013-01-29: qty 1

## 2013-01-29 MED ORDER — SODIUM CHLORIDE 0.9 % IV BOLUS (SEPSIS)
1000.0000 mL | Freq: Once | INTRAVENOUS | Status: AC
  Administered 2013-01-29: 1000 mL via INTRAVENOUS

## 2013-01-29 MED ORDER — SODIUM CHLORIDE 0.9 % IV SOLN
1000.0000 mL | Freq: Once | INTRAVENOUS | Status: AC
  Administered 2013-01-29: 1000 mL via INTRAVENOUS

## 2013-01-29 MED ORDER — PROMETHAZINE HCL 25 MG/ML IJ SOLN
12.5000 mg | Freq: Once | INTRAMUSCULAR | Status: AC
  Administered 2013-01-29: 12.5 mg via INTRAVENOUS

## 2013-01-29 MED ORDER — PROMETHAZINE HCL 25 MG/ML IJ SOLN
INTRAMUSCULAR | Status: AC
  Filled 2013-01-29: qty 1

## 2013-01-29 MED ORDER — PROMETHAZINE HCL 25 MG/ML IJ SOLN
12.5000 mg | Freq: Once | INTRAMUSCULAR | Status: AC
  Administered 2013-01-29: 12.5 mg via INTRAVENOUS
  Filled 2013-01-29: qty 1

## 2013-01-29 NOTE — ED Provider Notes (Addendum)
History    CSN: [id number] Arrival date & time 01/29/13  0036  First MD Initiated Contact with Patient 01/29/13 [phone]     Chief Complaint  Patient presents with  . Abdominal Pain, Nausea and Vomiting    (Consider location/radiation/quality/duration/timing/severity/associated sxs/prior Treatment) HPI This is a 27 year old female with cyclic vomiting syndrome who is well known to the ED. She is here with abdominal pain, nausea, vomiting and diarrhea that began 3 days ago. She's not sure what triggered it because she has not eaten any foods that are known to trigger her symptoms nor has she started her menses she also can hear this. She describes the symptoms as moderate to severe. She has not gotten relief with Phenergan suppositories at home and states that her rectum is irritated by using the suppositories. Her abdomen is not distended but it is diffusely tender. The symptoms are characterized as being like prior episodes of cyclic vomiting syndrome. She was seen in the ED 3 days ago and treated but has subsequently relapsed.  Past Medical History  Diagnosis Date  . Hypertension   . Cyclical vomiting    Past Surgical History  Procedure Laterality Date  . Tonsillectomy    . Tubal ligation    . Cesarean section    . Cholecystectomy    . Cesarean section    . Tubal ligation     No family history on file. History  Substance Use Topics  . Smoking status: Never Smoker   . Smokeless tobacco: Never Used  . Alcohol Use: No   OB History   Grav Para Term Preterm Abortions TAB SAB Ect Mult Living                 Review of Systems  All other systems reviewed and are negative.    Allergies  Other; Peanut-containing drug products; Percocet; Tomato; and Zofran  Home Medications   Current Outpatient Rx  Name  Route  Sig  Dispense  Refill  . flintstones complete (FLINTSTONES) 60 MG chewable tablet   Oral   Chew 1 tablet by mouth daily.           Marland Kitchen HYDROmorphone HCl (DILAUDID  PO)   Oral   Take 100 mg by mouth 2 (two) times daily.         Marland Kitchen labetalol (NORMODYNE) 100 MG tablet   Oral   Take 100 mg by mouth 2 (two) times daily.           Marland Kitchen LORazepam (ATIVAN) 2 MG tablet   Oral   Take 2 mg by mouth at bedtime as needed. For sleep         . potassium chloride (K-DUR) 10 MEQ tablet   Oral   Take 2 tablets (20 mEq total) by mouth 2 (two) times daily.   10 tablet   0   . EXPIRED: promethazine (PHENERGAN) 25 MG suppository   Rectal   Place 1 suppository (25 mg total) rectally every 4 (four) hours as needed for nausea.   12 each   0   . promethazine (PHENERGAN) 25 MG suppository   Rectal   Place 1 suppository (25 mg total) rectally every 6 (six) hours as needed for nausea.   12 each   0   . promethazine (PHENERGAN) 25 MG tablet   Oral   Take 1 tablet (25 mg total) by mouth every 6 (six) hours as needed for nausea.   30 tablet   0   . promethazine (  PHENERGAN) 25 MG tablet   Oral   Take 1 tablet (25 mg total) by mouth every 6 (six) hours as needed for nausea.   30 tablet   0    BP 148/93  Pulse 108  Temp(Src) 98.9 F (37.2 C) (Oral)  Resp 22  Ht 5' 3"  (1.6 m)  Wt 140 lb (63.504 kg)  BMI 24.81 kg/m2  SpO2 100%  LMP 01/11/2013  Physical Exam General: Well-developed, well-nourished female in no acute distress; appearance consistent with age of record HENT: normocephalic, atraumatic; mucous membranes somewhat dry Eyes: pupils equal round and reactive to light; extraocular muscles intact Neck: supple Heart: regular rate and rhythm; tachycardia Lungs: clear to auscultation bilaterally Abdomen: soft; nondistended; diffusely tender; bowel sounds present Extremities: No deformity; full range of motion; pulses normal Neurologic: Awake, alert and oriented; motor function intact in all extremities and symmetric; no facial droop Skin: Warm and dry Psychiatric: Normal mood and affect    ED Course  Procedures (including critical care  time)   MDM   Nursing notes and vitals signs, including pulse oximetry, reviewed.  Summary of this visit's results, reviewed by myself:  Labs:  Results for orders placed during the hospital encounter of 01/29/13 (from the past 24 hour(s))  CBC WITH DIFFERENTIAL     Status: Abnormal   Collection Time    01/29/13  1:46 AM      Result Value Range   WBC 13.6 (*) 4.0 - 10.5 K/uL   RBC 4.07  3.87 - 5.11 MIL/uL   Hemoglobin 12.6  12.0 - 15.0 g/dL   HCT 37.9  36.0 - 46.0 %   MCV 93.1  78.0 - 100.0 fL   MCH 31.0  26.0 - 34.0 pg   MCHC 33.2  30.0 - 36.0 g/dL   RDW 12.2  11.5 - 15.5 %   Platelets 273  150 - 400 K/uL   Neutrophils Relative % 69  43 - 77 %   Neutro Abs 9.4 (*) 1.7 - 7.7 K/uL   Lymphocytes Relative 22  12 - 46 %   Lymphs Abs 3.0  0.7 - 4.0 K/uL   Monocytes Relative 8  3 - 12 %   Monocytes Absolute 1.1 (*) 0.1 - 1.0 K/uL   Eosinophils Relative 1  0 - 5 %   Eosinophils Absolute 0.1  0.0 - 0.7 K/uL   Basophils Relative 0  0 - 1 %   Basophils Absolute 0.0  0.0 - 0.1 K/uL  COMPREHENSIVE METABOLIC PANEL     Status: Abnormal   Collection Time    01/29/13  1:46 AM      Result Value Range   Sodium 140  135 - 145 mEq/L   Potassium 3.5  3.5 - 5.1 mEq/L   Chloride 102  96 - 112 mEq/L   CO2 25  19 - 32 mEq/L   Glucose, Bld 103 (*) 70 - 99 mg/dL   BUN 12  6 - 23 mg/dL   Creatinine, Ser 0.80  0.50 - 1.10 mg/dL   Calcium 10.0  8.4 - 10.5 mg/dL   Total Protein 7.5  6.0 - 8.3 g/dL   Albumin 4.1  3.5 - 5.2 g/dL   AST 16  0 - 37 U/L   ALT 10  0 - 35 U/L   Alkaline Phosphatase 69  39 - 117 U/L   Total Bilirubin 0.3  0.3 - 1.2 mg/dL   GFR calc non Af Amer >90  >90 mL/min   GFR  calc Af Amer >90  >90 mL/min    4:55 AM Patient not tolerating fluids by mouth after IV medications and fluids. She requests a prescription for Phenergan tablets. She has an appointment with her primary physician this coming Monday, 5 days from now.  Wynetta Fines, MD 01/29/13 0455  Wynetta Fines,  MD 01/29/13 [phone]

## 2013-01-29 NOTE — ED Notes (Signed)
Pt presents with [facility] attack per patient and that patient was here last night and has not got any better.

## 2013-02-23 ENCOUNTER — Encounter (HOSPITAL_BASED_OUTPATIENT_CLINIC_OR_DEPARTMENT_OTHER): Payer: Self-pay | Admitting: Emergency Medicine

## 2013-02-23 ENCOUNTER — Emergency Department (HOSPITAL_BASED_OUTPATIENT_CLINIC_OR_DEPARTMENT_OTHER)
Admission: EM | Admit: 2013-02-23 | Discharge: 2013-02-23 | Disposition: A | Discharge status: Home / Self Care | Payer: Self-pay | Attending: Emergency Medicine | Admitting: Emergency Medicine

## 2013-02-23 DIAGNOSIS — Z3202 Encounter for pregnancy test, result negative: Secondary | ICD-10-CM | POA: Insufficient documentation

## 2013-02-23 DIAGNOSIS — Z9089 Acquired absence of other organs: Secondary | ICD-10-CM | POA: Insufficient documentation

## 2013-02-23 DIAGNOSIS — Z9889 Other specified postprocedural states: Secondary | ICD-10-CM | POA: Insufficient documentation

## 2013-02-23 DIAGNOSIS — I10 Essential (primary) hypertension: Secondary | ICD-10-CM | POA: Insufficient documentation

## 2013-02-23 DIAGNOSIS — Z79899 Other long term (current) drug therapy: Secondary | ICD-10-CM | POA: Insufficient documentation

## 2013-02-23 DIAGNOSIS — Z9851 Tubal ligation status: Secondary | ICD-10-CM | POA: Insufficient documentation

## 2013-02-23 DIAGNOSIS — R1115 Cyclical vomiting syndrome unrelated to migraine: Secondary | ICD-10-CM | POA: Insufficient documentation

## 2013-02-23 DIAGNOSIS — R630 Anorexia: Secondary | ICD-10-CM | POA: Insufficient documentation

## 2013-02-23 DIAGNOSIS — R1084 Generalized abdominal pain: Secondary | ICD-10-CM | POA: Insufficient documentation

## 2013-02-23 LAB — COMPREHENSIVE METABOLIC PANEL
ALT: 11 U/L (ref 0–35)
Albumin: 4.4 g/dL (ref 3.5–5.2)
Alkaline Phosphatase: 75 U/L (ref 39–117)
Potassium: 3.3 mEq/L — ABNORMAL LOW (ref 3.5–5.1)
Sodium: 140 mEq/L (ref 135–145)
Total Protein: 7.9 g/dL (ref 6.0–8.3)

## 2013-02-23 LAB — CBC WITH DIFFERENTIAL/PLATELET
Basophils Absolute: 0 10*3/uL (ref 0.0–0.1)
Basophils Relative: 0 % (ref 0–1)
Eosinophils Absolute: 0.1 10*3/uL (ref 0.0–0.7)
MCH: 31.1 pg (ref 26.0–34.0)
MCHC: 33.8 g/dL (ref 30.0–36.0)
Neutrophils Relative %: 74 % (ref 43–77)
Platelets: 266 10*3/uL (ref 150–400)
RBC: 4.31 MIL/uL (ref 3.87–5.11)
RDW: 12.5 % (ref 11.5–15.5)

## 2013-02-23 LAB — URINE MICROSCOPIC-ADD ON

## 2013-02-23 LAB — URINALYSIS, ROUTINE W REFLEX MICROSCOPIC
Bilirubin Urine: NEGATIVE
Nitrite: NEGATIVE
Specific Gravity, Urine: 1.015 (ref 1.005–1.030)
pH: 7.5 (ref 5.0–8.0)

## 2013-02-23 LAB — PREGNANCY, URINE: Preg Test, Ur: NEGATIVE

## 2013-02-23 MED ORDER — DIPHENHYDRAMINE HCL 50 MG/ML IJ SOLN
25.0000 mg | Freq: Once | INTRAMUSCULAR | Status: AC
  Administered 2013-02-23: 25 mg via INTRAVENOUS
  Filled 2013-02-23: qty 1

## 2013-02-23 MED ORDER — HYDROMORPHONE HCL PF 1 MG/ML IJ SOLN
1.0000 mg | Freq: Once | INTRAMUSCULAR | Status: AC
  Administered 2013-02-23: 1 mg via INTRAVENOUS
  Filled 2013-02-23: qty 1

## 2013-02-23 MED ORDER — DROPERIDOL 2.5 MG/ML IJ SOLN: 1.2500 mg | Freq: Once | INTRAMUSCULAR | Status: DC

## 2013-02-23 MED ORDER — SODIUM CHLORIDE 0.9 % IV BOLUS (SEPSIS)
1000.0000 mL | Freq: Once | INTRAVENOUS | Status: AC
  Administered 2013-02-23: 1000 mL via INTRAVENOUS

## 2013-02-23 MED ORDER — LORAZEPAM 2 MG/ML IJ SOLN
1.0000 mg | Freq: Once | INTRAMUSCULAR | Status: DC
  Filled 2013-02-23: qty 1

## 2013-02-23 MED ORDER — PROMETHAZINE HCL 25 MG/ML IJ SOLN
25.0000 mg | Freq: Once | INTRAMUSCULAR | Status: AC
  Administered 2013-02-23: 25 mg via INTRAVENOUS
  Filled 2013-02-23: qty 1

## 2013-02-23 MED ORDER — LORAZEPAM 2 MG/ML IJ SOLN
1.0000 mg | Freq: Once | INTRAMUSCULAR | Status: AC
  Administered 2013-02-23: 1 mg via INTRAVENOUS

## 2013-02-23 MED ORDER — METOCLOPRAMIDE HCL 5 MG/ML IJ SOLN
10.0000 mg | Freq: Once | INTRAMUSCULAR | Status: DC
  Filled 2013-02-23: qty 2

## 2013-02-23 NOTE — ED Notes (Signed)
amb to BR w/o difficulty 

## 2013-02-23 NOTE — ED Provider Notes (Signed)
CSN: [id number]     Arrival date & time 02/23/13  0524 History     First MD Initiated Contact with Patient 02/23/13 [phone]     Chief Complaint  Patient presents with  . Abdominal Pain    hx cyclic vomiting   (Consider location/radiation/quality/duration/timing/severity/associated sxs/prior Treatment) HPI Comments: Patient present to the ED with exacerbation of her cyclic vomiting syndrome. She complains of diffuse abdominal pain that is worse on the right side with nausea vomiting since yesterday morning. She reports being out of her Phenergan and Dilaudid at home. She denies any fevers, chills, dysuria hematuria. Denies any back pain. Denies any change in the chronic character of symptoms.   The history is provided by the patient.    Past Medical History  Diagnosis Date  . Hypertension   . Cyclical vomiting    Past Surgical History  Procedure Laterality Date  . Tonsillectomy    . Tubal ligation    . Cesarean section    . Cholecystectomy    . Cesarean section    . Tubal ligation     History reviewed. No pertinent family history. History  Substance Use Topics  . Smoking status: Never Smoker   . Smokeless tobacco: Never Used  . Alcohol Use: No   OB History   Grav Para Term Preterm Abortions TAB SAB Ect Mult Living                 Review of Systems  Constitutional: Positive for activity change and appetite change. Negative for fever.  HENT: Negative for congestion and rhinorrhea.   Respiratory: Negative for cough, chest tightness and shortness of breath.   Cardiovascular: Negative for chest pain.  Gastrointestinal: Positive for nausea, vomiting and abdominal pain. Negative for diarrhea.  Genitourinary: Negative for dysuria, hematuria, vaginal bleeding and vaginal discharge.  Musculoskeletal: Negative for back pain.  Skin: Negative for rash.  Neurological: Negative for dizziness, weakness and headaches.  A complete 10 system review of systems was obtained and all  systems are negative except as noted in the HPI and PMH. \   Allergies  Other; Peanut-containing drug products; Percocet; Tomato; and Zofran  Home Medications   Current Outpatient Rx  Name  Route  Sig  Dispense  Refill  . flintstones complete (FLINTSTONES) 60 MG chewable tablet   Oral   Chew 1 tablet by mouth daily.           Marland Kitchen HYDROmorphone HCl (DILAUDID PO)   Oral   Take 100 mg by mouth 2 (two) times daily.         Marland Kitchen labetalol (NORMODYNE) 100 MG tablet   Oral   Take 100 mg by mouth 2 (two) times daily.           Marland Kitchen LORazepam (ATIVAN) 2 MG tablet   Oral   Take 2 mg by mouth at bedtime as needed. For sleep         . potassium chloride (K-DUR) 10 MEQ tablet   Oral   Take 2 tablets (20 mEq total) by mouth 2 (two) times daily.   10 tablet   0   . EXPIRED: promethazine (PHENERGAN) 25 MG suppository   Rectal   Place 1 suppository (25 mg total) rectally every 4 (four) hours as needed for nausea.   12 each   0   . promethazine (PHENERGAN) 25 MG suppository   Rectal   Place 1 suppository (25 mg total) rectally every 6 (six) hours as needed for nausea.  12 each   0   . promethazine (PHENERGAN) 25 MG tablet   Oral   Take 1 tablet (25 mg total) by mouth every 6 (six) hours as needed for nausea.   30 tablet   0   . promethazine (PHENERGAN) 25 MG tablet   Oral   Take 1 tablet (25 mg total) by mouth every 6 (six) hours as needed for nausea.   30 tablet   0    BP 140/94  Pulse 102  Temp(Src) 97.5 F (36.4 C) (Oral)  Resp 18  Ht 5' 3"  (1.6 m)  Wt 138 lb (62.596 kg)  BMI 24.45 kg/m2  SpO2 97% Physical Exam  Constitutional: She is oriented to person, place, and time. She appears well-developed and well-nourished. No distress.  HENT:  Head: Normocephalic and atraumatic.  Mouth/Throat: Oropharynx is clear and moist. No oropharyngeal exudate.  Eyes: Conjunctivae and EOM are normal. Pupils are equal, round, and reactive to light.  Neck: Normal range of  motion. Neck supple.  Cardiovascular: Normal rate, regular rhythm and normal heart sounds.   No murmur heard. Pulmonary/Chest: Effort normal and breath sounds normal. No respiratory distress.  Abdominal: Soft. There is tenderness. There is no rebound and no guarding.  Diffuse abdominal tenderness, no peritoneal signs.  Musculoskeletal: Normal range of motion. She exhibits no edema and no tenderness.  Neurological: She is alert and oriented to person, place, and time. No cranial nerve deficit. She exhibits normal muscle tone. Coordination normal.  Skin: Skin is warm.    ED Course   Procedures (including critical care time)  Labs Reviewed  CBC WITH DIFFERENTIAL - Abnormal; Notable for the following:    WBC 11.4 (*)    Neutro Abs 8.5 (*)    All other components within normal limits  COMPREHENSIVE METABOLIC PANEL  LIPASE, BLOOD  URINALYSIS, ROUTINE W REFLEX MICROSCOPIC  PREGNANCY, URINE   No results found. No diagnosis found.  MDM  Acute exacerbation of chronic cyclic vomiting syndrome.  Out of meds at home.  No change from previous presentations per patient. Abdomen soft without peritoneal signs.  Will give IV fluids, antiemetics, pain control. Labs pending at time of sign out to Dr. Canary Brim.  Anticipate discharge when symptoms controlled.  Ezequiel Essex, MD 02/23/13 [phone]

## 2013-02-23 NOTE — ED Notes (Signed)
IV 22 left arm removed per protocol

## 2013-02-23 NOTE — ED Notes (Addendum)
Pain decreased but remains >6 pt responds verbally appropriate to questions rates pain 6-7 /10 scale. O2 at 2l n/c in place after medications to prevent desat but pt able to maintain sat >95% prior to O2. Urine specimen requested a second time

## 2013-02-23 NOTE — ED Notes (Signed)
Pt reports hx cyclic vomiting since age 27 yrs and has chronic abd pain n/v/d symptoms worsen over last several days and pt is out of her medications

## 2013-02-25 LAB — URINE CULTURE

## 2013-02-25 NOTE — ED Notes (Signed)
Post ED Visit - Positive Culture Follow-up  Culture report reviewed by antimicrobial stewardship pharmacist: []  Wes Dulaney, Pharm.D., BCPS [x]  Heide Guile, Pharm.D., BCPS []  Alycia Rossetti, Pharm.D., BCPS []  Hamilton, Pharm.D., BCPS, AAHIVP []  Legrand Como, Pharm.D., BCPS, AAHIVP  Positive urine culture  no further patient follow-up is required at this time.  Varney Baas 02/25/2013, 1:03 PM

## 2013-03-08 ENCOUNTER — Encounter (HOSPITAL_BASED_OUTPATIENT_CLINIC_OR_DEPARTMENT_OTHER): Payer: Self-pay

## 2013-03-08 ENCOUNTER — Emergency Department (HOSPITAL_BASED_OUTPATIENT_CLINIC_OR_DEPARTMENT_OTHER)
Admission: EM | Admit: 2013-03-08 | Discharge: 2013-03-08 | Disposition: A | Discharge status: Home / Self Care | Payer: Self-pay | Attending: Emergency Medicine | Admitting: Emergency Medicine

## 2013-03-08 DIAGNOSIS — R112 Nausea with vomiting, unspecified: Secondary | ICD-10-CM | POA: Insufficient documentation

## 2013-03-08 DIAGNOSIS — R1115 Cyclical vomiting syndrome unrelated to migraine: Secondary | ICD-10-CM | POA: Insufficient documentation

## 2013-03-08 DIAGNOSIS — Z79899 Other long term (current) drug therapy: Secondary | ICD-10-CM | POA: Insufficient documentation

## 2013-03-08 DIAGNOSIS — N390 Urinary tract infection, site not specified: Secondary | ICD-10-CM | POA: Insufficient documentation

## 2013-03-08 DIAGNOSIS — Z3202 Encounter for pregnancy test, result negative: Secondary | ICD-10-CM | POA: Insufficient documentation

## 2013-03-08 DIAGNOSIS — R109 Unspecified abdominal pain: Secondary | ICD-10-CM | POA: Insufficient documentation

## 2013-03-08 DIAGNOSIS — I10 Essential (primary) hypertension: Secondary | ICD-10-CM | POA: Insufficient documentation

## 2013-03-08 LAB — CBC WITH DIFFERENTIAL/PLATELET
Basophils Absolute: 0 10*3/uL (ref 0.0–0.1)
Eosinophils Relative: 2 % (ref 0–5)
Lymphocytes Relative: 15 % (ref 12–46)
Lymphs Abs: 1.7 10*3/uL (ref 0.7–4.0)
MCV: 93.1 fL (ref 78.0–100.0)
Neutro Abs: 8.8 10*3/uL — ABNORMAL HIGH (ref 1.7–7.7)
Neutrophils Relative %: 77 % (ref 43–77)
Platelets: 234 10*3/uL (ref 150–400)
RBC: 4.49 MIL/uL (ref 3.87–5.11)
RDW: 12.4 % (ref 11.5–15.5)
WBC: 11.4 10*3/uL — ABNORMAL HIGH (ref 4.0–10.5)

## 2013-03-08 LAB — URINALYSIS, ROUTINE W REFLEX MICROSCOPIC
Bilirubin Urine: NEGATIVE
Ketones, ur: NEGATIVE mg/dL
Nitrite: NEGATIVE
Specific Gravity, Urine: 1.012 (ref 1.005–1.030)
Urobilinogen, UA: 0.2 mg/dL (ref 0.0–1.0)

## 2013-03-08 LAB — COMPREHENSIVE METABOLIC PANEL
ALT: 11 U/L (ref 0–35)
AST: 21 U/L (ref 0–37)
Alkaline Phosphatase: 78 U/L (ref 39–117)
CO2: 23 mEq/L (ref 19–32)
Chloride: 103 mEq/L (ref 96–112)
GFR calc Af Amer: >90 mL/min (ref >90)
GFR calc non Af Amer: 87 mL/min — ABNORMAL LOW (ref >90)
Glucose, Bld: 119 mg/dL — ABNORMAL HIGH (ref 70–99)
Potassium: 4 mEq/L (ref 3.5–5.1)
Sodium: 139 mEq/L (ref 135–145)
Total Bilirubin: 0.3 mg/dL (ref 0.3–1.2)

## 2013-03-08 LAB — URINE MICROSCOPIC-ADD ON

## 2013-03-08 LAB — CG4 I-STAT (LACTIC ACID): Lactic Acid, Venous: 2.67 mmol/L — ABNORMAL HIGH (ref 0.5–2.2)

## 2013-03-08 MED ORDER — SODIUM CHLORIDE 0.9 % IV BOLUS (SEPSIS)
1000.0000 mL | Freq: Once | INTRAVENOUS | Status: AC
  Administered 2013-03-08: 1000 mL via INTRAVENOUS

## 2013-03-08 MED ORDER — HYDROMORPHONE HCL PF 1 MG/ML IJ SOLN
0.5000 mg | Freq: Once | INTRAMUSCULAR | Status: AC
  Administered 2013-03-08: 0.5 mg via INTRAVENOUS
  Filled 2013-03-08: qty 1

## 2013-03-08 MED ORDER — DIPHENHYDRAMINE HCL 50 MG/ML IJ SOLN
50.0000 mg | Freq: Once | INTRAMUSCULAR | Status: AC
  Administered 2013-03-08: 50 mg via INTRAVENOUS
  Filled 2013-03-08: qty 1

## 2013-03-08 MED ORDER — METOCLOPRAMIDE HCL 5 MG/ML IJ SOLN
10.0000 mg | Freq: Once | INTRAMUSCULAR | Status: AC
  Administered 2013-03-08: 10 mg via INTRAVENOUS
  Filled 2013-03-08: qty 2

## 2013-03-08 MED ORDER — PROMETHAZINE HCL 25 MG/ML IJ SOLN
25.0000 mg | Freq: Once | INTRAMUSCULAR | Status: AC
  Administered 2013-03-08: 25 mg via INTRAVENOUS
  Filled 2013-03-08: qty 1

## 2013-03-08 MED ORDER — PROMETHAZINE HCL 25 MG/ML IJ SOLN
25.0000 mg | Freq: Once | INTRAMUSCULAR | Status: AC
  Administered 2013-03-08: 25 mg via INTRAMUSCULAR
  Filled 2013-03-08: qty 1

## 2013-03-08 MED ORDER — CEPHALEXIN 500 MG PO CAPS: 500.0000 mg | ORAL_CAPSULE | Freq: Two times a day (BID) | ORAL | Status: DC

## 2013-03-08 MED ORDER — HYDROMORPHONE HCL PF 1 MG/ML IJ SOLN
1.0000 mg | Freq: Once | INTRAMUSCULAR | Status: AC
  Administered 2013-03-08: 1 mg via INTRAVENOUS
  Filled 2013-03-08: qty 1

## 2013-03-08 MED ORDER — HYDROMORPHONE HCL PF 1 MG/ML IJ SOLN
0.5000 mg | Freq: Once | INTRAMUSCULAR | Status: AC
  Administered 2013-03-08: 0.5 mg via INTRAVENOUS

## 2013-03-08 NOTE — ED Provider Notes (Signed)
CSN: [id number]     Arrival date & time 03/08/13  0819 History   None    Chief Complaint  Patient presents with  . cyclic vomiting syndrome    (Consider location/radiation/quality/duration/timing/severity/associated sxs/prior Treatment) HPI  Is a 27 year old female with history of cyclic vomiting who presents with an acute exacerbation. Patient states that her symptoms started last night. She said nonbilious, nonbloody emesis. She reports diffuse abdominal pain without radiation. Currently her pain a 9 on a 10. She denies any diarrhea. Patient states that this is typical for her cyclic vomiting. She's currently on her period. She denies any fever, chest pain, shortness of breath, urinary symptoms, focal weakness or numbness. Past Medical History  Diagnosis Date  . Hypertension   . Cyclical vomiting    Past Surgical History  Procedure Laterality Date  . Tonsillectomy    . Tubal ligation    . Cesarean section    . Cholecystectomy    . Cesarean section    . Tubal ligation     No family history on file. History  Substance Use Topics  . Smoking status: Never Smoker   . Smokeless tobacco: Never Used  . Alcohol Use: No   OB History   Grav Para Term Preterm Abortions TAB SAB Ect Mult Living                 Review of Systems  Constitutional: Negative for fever.  Respiratory: Negative for cough, chest tightness and shortness of breath.   Cardiovascular: Negative for chest pain.  Gastrointestinal: Positive for nausea, vomiting and abdominal pain. Negative for diarrhea.  Genitourinary: Negative for dysuria.  Musculoskeletal: Negative for back pain.  Skin: Negative for rash.  Neurological: Negative for headaches.  All other systems reviewed and are negative.    Allergies  Other; Peanut-containing drug products; Percocet; Tomato; and Zofran  Home Medications   Current Outpatient Rx  Name  Route  Sig  Dispense  Refill  . flintstones complete (FLINTSTONES) 60 MG chewable  tablet   Oral   Chew 1 tablet by mouth daily.           Marland Kitchen HYDROmorphone HCl (DILAUDID PO)   Oral   Take 100 mg by mouth 2 (two) times daily.         Marland Kitchen labetalol (NORMODYNE) 100 MG tablet   Oral   Take 100 mg by mouth 2 (two) times daily.           Marland Kitchen LORazepam (ATIVAN) 2 MG tablet   Oral   Take 2 mg by mouth at bedtime as needed. For sleep         . potassium chloride (K-DUR) 10 MEQ tablet   Oral   Take 2 tablets (20 mEq total) by mouth 2 (two) times daily.   10 tablet   0   . EXPIRED: promethazine (PHENERGAN) 25 MG suppository   Rectal   Place 1 suppository (25 mg total) rectally every 4 (four) hours as needed for nausea.   12 each   0   . promethazine (PHENERGAN) 25 MG suppository   Rectal   Place 1 suppository (25 mg total) rectally every 6 (six) hours as needed for nausea.   12 each   0   . promethazine (PHENERGAN) 25 MG tablet   Oral   Take 1 tablet (25 mg total) by mouth every 6 (six) hours as needed for nausea.   30 tablet   0   . promethazine (PHENERGAN) 25 MG tablet  Oral   Take 1 tablet (25 mg total) by mouth every 6 (six) hours as needed for nausea.   30 tablet   0    BP 149/102  Pulse 109  Temp(Src) 98.4 F (36.9 C) (Oral)  Resp 13  SpO2 100% Physical Exam  Nursing note and vitals reviewed. Constitutional: She is oriented to person, place, and time. She appears well-developed and well-nourished.  Vomiting with a small amount of nonbilious, nonbloody emesis noted in bag.  HENT:  Head: Normocephalic and atraumatic.  Eyes: Pupils are equal, round, and reactive to light.  Neck: Neck supple.  Cardiovascular: Regular rhythm and normal heart sounds.   tachycardia  Pulmonary/Chest: Effort normal. No respiratory distress. She has no wheezes.  Abdominal: Soft. Bowel sounds are normal. There is tenderness. There is no rebound and no guarding.  Diffuse tenderness to palpation  Neurological: She is alert and oriented to person, place, and time.   Skin: Skin is warm and dry.  Psychiatric: She has a normal mood and affect.    ED Course  Procedures (including critical care time) Labs Review Labs Reviewed  CBC WITH DIFFERENTIAL - Abnormal; Notable for the following:    WBC 11.4 (*)    Neutro Abs 8.8 (*)    All other components within normal limits  COMPREHENSIVE METABOLIC PANEL - Abnormal; Notable for the following:    Glucose, Bld 119 (*)    GFR calc non Af Amer 87 (*)    All other components within normal limits  URINALYSIS, ROUTINE W REFLEX MICROSCOPIC - Abnormal; Notable for the following:    APPearance CLOUDY (*)    pH 8.5 (*)    Hgb urine dipstick LARGE (*)    Leukocytes, UA MODERATE (*)    All other components within normal limits  URINE MICROSCOPIC-ADD ON - Abnormal; Notable for the following:    Squamous Epithelial / LPF FEW (*)    Bacteria, UA MANY (*)    All other components within normal limits  CG4 I-STAT (LACTIC ACID) - Abnormal; Notable for the following:    Lactic Acid, Venous 2.67 (*)    All other components within normal limits  URINE CULTURE  PREGNANCY, URINE   Imaging Review No results found.  MDM   1. Cyclic vomiting syndrome   2.  UTI  This is a 27 year old female with history of cyclic vomiting who presents with acute exacerbation. She reports that the symptoms are classic for Versed with vomiting. She is tachycardic but nontoxic-appearing on exam.  She has diffuse tenderness to palpation over abdomen without peritoneal signs. Lab work was obtained including a lactate. Patient was given a normal saline bolus, pain control, and anti-emedic.  Patient received multiple doses of Dilaudid, Phenergan, Reglan, and Benadryl. Lab work is notable for mild lactic acidosis which she's had in the past. Otherwise it was at the patient's baseline. Patient does have evidence of UTI. She is asymptomatic. Patient had improvement of her symptoms in the ER. She will be discharged home.  After history, exam, and  medical workup I feel the patient has been appropriately medically screened and is safe for discharge home. Pertinent diagnoses were discussed with the patient. Patient was given return precautions.  Merryl Hacker, MD 03/08/13 [phone]

## 2013-03-09 ENCOUNTER — Emergency Department (HOSPITAL_BASED_OUTPATIENT_CLINIC_OR_DEPARTMENT_OTHER)
Admission: EM | Admit: 2013-03-09 | Discharge: 2013-03-09 | Disposition: A | Discharge status: Home / Self Care | Payer: Self-pay | Attending: Emergency Medicine | Admitting: Emergency Medicine

## 2013-03-09 ENCOUNTER — Encounter (HOSPITAL_BASED_OUTPATIENT_CLINIC_OR_DEPARTMENT_OTHER): Payer: Self-pay | Admitting: *Deleted

## 2013-03-09 DIAGNOSIS — F1210 Cannabis abuse, uncomplicated: Secondary | ICD-10-CM | POA: Insufficient documentation

## 2013-03-09 DIAGNOSIS — Z79899 Other long term (current) drug therapy: Secondary | ICD-10-CM | POA: Insufficient documentation

## 2013-03-09 DIAGNOSIS — R1115 Cyclical vomiting syndrome unrelated to migraine: Secondary | ICD-10-CM | POA: Insufficient documentation

## 2013-03-09 DIAGNOSIS — F1290 Cannabis use, unspecified, uncomplicated: Secondary | ICD-10-CM

## 2013-03-09 DIAGNOSIS — I10 Essential (primary) hypertension: Secondary | ICD-10-CM | POA: Insufficient documentation

## 2013-03-09 LAB — RAPID URINE DRUG SCREEN, HOSP PERFORMED
Amphetamines: NOT DETECTED
Benzodiazepines: NOT DETECTED
Opiates: POSITIVE — AB
Tetrahydrocannabinol: POSITIVE — AB

## 2013-03-09 MED ORDER — HYDROMORPHONE HCL PF 1 MG/ML IJ SOLN
1.0000 mg | Freq: Once | INTRAMUSCULAR | Status: AC
  Administered 2013-03-09: 1 mg via INTRAVENOUS
  Filled 2013-03-09: qty 1

## 2013-03-09 MED ORDER — PROMETHAZINE HCL 25 MG/ML IJ SOLN
25.0000 mg | Freq: Once | INTRAMUSCULAR | Status: AC
  Administered 2013-03-09: 25 mg via INTRAVENOUS
  Filled 2013-03-09: qty 1

## 2013-03-09 MED ORDER — DIPHENHYDRAMINE HCL 50 MG/ML IJ SOLN
25.0000 mg | Freq: Once | INTRAMUSCULAR | Status: AC
  Administered 2013-03-09: 25 mg via INTRAVENOUS
  Filled 2013-03-09: qty 1

## 2013-03-09 NOTE — ED Notes (Signed)
Patient here with c/o nausea and vomiting.  Patient was seen here yesterday for same and states that she is unable to keep anything down

## 2013-03-09 NOTE — ED Notes (Signed)
Pt resting in bed. Pt states she does not have to urinate. Pt states she will need fluids before being able to urinate.

## 2013-03-09 NOTE — ED Notes (Signed)
D/c with ride- no new rx given

## 2013-03-09 NOTE — ED Notes (Signed)
PA made aware pt is requesting more medications.

## 2013-03-09 NOTE — ED Provider Notes (Signed)
CSN: [id number]     Arrival date & time 03/09/13  1353 History   First MD Initiated Contact with Patient 03/09/13 1455     Chief Complaint  Patient presents with  . Emesis   (Consider location/radiation/quality/duration/timing/severity/associated sxs/prior Treatment) Patient is a 27 y.o. female presenting with vomiting. The history is provided by the patient. No language interpreter was used.  Emesis Severity:  Severe Duration:  2 days Timing:  Constant Number of daily episodes:  Multiple Progression:  Worsening Chronicity:  Recurrent Relieved by:  Nothing Worsened by:  Nothing tried Associated symptoms: no abdominal pain   Risk factors: no diabetes   Pt reports she has cyclical vomiting.  Pt seen here yesterday for same.  Pt reports she is unable to keep medications down at home.     Past Medical History  Diagnosis Date  . Hypertension   . Cyclical vomiting    Past Surgical History  Procedure Laterality Date  . Tonsillectomy    . Tubal ligation    . Cesarean section    . Cholecystectomy    . Cesarean section    . Tubal ligation     History reviewed. No pertinent family history. History  Substance Use Topics  . Smoking status: Never Smoker   . Smokeless tobacco: Never Used  . Alcohol Use: No   OB History   Grav Para Term Preterm Abortions TAB SAB Ect Mult Living                 Review of Systems  Gastrointestinal: Positive for nausea and vomiting. Negative for abdominal pain.    Allergies  Other; Peanut-containing drug products; Percocet; Tomato; and Zofran  Home Medications   Current Outpatient Rx  Name  Route  Sig  Dispense  Refill  . cephALEXin (KEFLEX) 500 MG capsule   Oral   Take 1 capsule (500 mg total) by mouth 2 (two) times daily.   6 capsule   0   . flintstones complete (FLINTSTONES) 60 MG chewable tablet   Oral   Chew 1 tablet by mouth daily.           Marland Kitchen HYDROmorphone HCl (DILAUDID PO)   Oral   Take 100 mg by mouth 2 (two) times  daily.         Marland Kitchen labetalol (NORMODYNE) 100 MG tablet   Oral   Take 100 mg by mouth 2 (two) times daily.           Marland Kitchen LORazepam (ATIVAN) 2 MG tablet   Oral   Take 2 mg by mouth at bedtime as needed. For sleep         . potassium chloride (K-DUR) 10 MEQ tablet   Oral   Take 2 tablets (20 mEq total) by mouth 2 (two) times daily.   10 tablet   0   . EXPIRED: promethazine (PHENERGAN) 25 MG suppository   Rectal   Place 1 suppository (25 mg total) rectally every 4 (four) hours as needed for nausea.   12 each   0   . promethazine (PHENERGAN) 25 MG suppository   Rectal   Place 1 suppository (25 mg total) rectally every 6 (six) hours as needed for nausea.   12 each   0   . promethazine (PHENERGAN) 25 MG tablet   Oral   Take 1 tablet (25 mg total) by mouth every 6 (six) hours as needed for nausea.   30 tablet   0   . promethazine (PHENERGAN) 25 MG  tablet   Oral   Take 1 tablet (25 mg total) by mouth every 6 (six) hours as needed for nausea.   30 tablet   0    BP 159/98  Pulse 110  Temp(Src) 98.8 F (37.1 C) (Oral)  Resp 18  SpO2 100% Physical Exam  Nursing note and vitals reviewed. Constitutional: She is oriented to person, place, and time. She appears well-developed and well-nourished.  HENT:  Head: Normocephalic and atraumatic.  Eyes: Conjunctivae and EOM are normal. Pupils are equal, round, and reactive to light.  Neck: Normal range of motion. Neck supple.  Cardiovascular: Normal rate.   Pulmonary/Chest: Effort normal and breath sounds normal.  Abdominal: Soft. Bowel sounds are normal. There is no tenderness.  Musculoskeletal: Normal range of motion.  Neurological: She is alert and oriented to person, place, and time. She has normal reflexes.  Skin: Skin is warm.  Psychiatric: She has a normal mood and affect.    ED Course  Procedures (including critical care time) Labs Review Labs Reviewed  URINE RAPID DRUG SCREEN (HOSP PERFORMED) - Abnormal; Notable  for the following:    Opiates POSITIVE (*)    Tetrahydrocannabinol POSITIVE (*)    All other components within normal limits   Imaging Review No results found.  MDM   1. Cyclical vomiting   2. Marijuana use    Pt advised of association of marijuana use and cyclical vomiting.   I advised pt to discontinue marijuana use, continue home medications and follow up with primary care    Fransico Meadow, PA-C 03/09/13 2014

## 2013-03-10 LAB — URINE CULTURE

## 2013-03-10 NOTE — ED Provider Notes (Signed)
Medical screening examination/treatment/procedure(s) were performed by non-physician practitioner and as supervising physician I was immediately available for consultation/collaboration.   Lolita Patella, MD 03/10/13 [phone]

## 2013-03-20 ENCOUNTER — Encounter (HOSPITAL_BASED_OUTPATIENT_CLINIC_OR_DEPARTMENT_OTHER): Payer: Self-pay | Admitting: *Deleted

## 2013-03-20 ENCOUNTER — Emergency Department (HOSPITAL_BASED_OUTPATIENT_CLINIC_OR_DEPARTMENT_OTHER)
Admission: EM | Admit: 2013-03-20 | Discharge: 2013-03-20 | Disposition: A | Discharge status: Home / Self Care | Payer: Medicaid Other | Attending: Emergency Medicine | Admitting: Emergency Medicine

## 2013-03-20 DIAGNOSIS — R1115 Cyclical vomiting syndrome unrelated to migraine: Secondary | ICD-10-CM | POA: Insufficient documentation

## 2013-03-20 DIAGNOSIS — R197 Diarrhea, unspecified: Secondary | ICD-10-CM | POA: Insufficient documentation

## 2013-03-20 DIAGNOSIS — Z79899 Other long term (current) drug therapy: Secondary | ICD-10-CM | POA: Insufficient documentation

## 2013-03-20 DIAGNOSIS — Z792 Long term (current) use of antibiotics: Secondary | ICD-10-CM | POA: Insufficient documentation

## 2013-03-20 DIAGNOSIS — Z9889 Other specified postprocedural states: Secondary | ICD-10-CM | POA: Insufficient documentation

## 2013-03-20 DIAGNOSIS — Z9851 Tubal ligation status: Secondary | ICD-10-CM | POA: Insufficient documentation

## 2013-03-20 DIAGNOSIS — R109 Unspecified abdominal pain: Secondary | ICD-10-CM | POA: Insufficient documentation

## 2013-03-20 DIAGNOSIS — I10 Essential (primary) hypertension: Secondary | ICD-10-CM | POA: Insufficient documentation

## 2013-03-20 DIAGNOSIS — R000 Tachycardia, unspecified: Secondary | ICD-10-CM | POA: Insufficient documentation

## 2013-03-20 LAB — URINALYSIS, ROUTINE W REFLEX MICROSCOPIC
Bilirubin Urine: NEGATIVE
Ketones, ur: 15 mg/dL — AB
Nitrite: NEGATIVE
Protein, ur: NEGATIVE mg/dL
Specific Gravity, Urine: 1.012 (ref 1.005–1.030)
Urobilinogen, UA: 0.2 mg/dL (ref 0.0–1.0)

## 2013-03-20 LAB — CBC WITH DIFFERENTIAL/PLATELET
Basophils Absolute: 0 10*3/uL (ref 0.0–0.1)
Eosinophils Absolute: 0.1 10*3/uL (ref 0.0–0.7)
Lymphocytes Relative: 7 % — ABNORMAL LOW (ref 12–46)
MCV: 92 fL (ref 78.0–100.0)
Monocytes Absolute: 0.5 10*3/uL (ref 0.1–1.0)
Monocytes Relative: 4 % (ref 3–12)
Neutro Abs: 11.4 10*3/uL — ABNORMAL HIGH (ref 1.7–7.7)
Platelets: 336 10*3/uL (ref 150–400)
RDW: 12.6 % (ref 11.5–15.5)
WBC: 12.9 10*3/uL — ABNORMAL HIGH (ref 4.0–10.5)

## 2013-03-20 LAB — LIPASE, BLOOD: Lipase: 22 U/L (ref 11–59)

## 2013-03-20 LAB — BASIC METABOLIC PANEL
Chloride: 101 mEq/L (ref 96–112)
Creatinine, Ser: 0.8 mg/dL (ref 0.50–1.10)
GFR calc Af Amer: >90 mL/min (ref >90)
GFR calc non Af Amer: >90 mL/min (ref >90)

## 2013-03-20 MED ORDER — POTASSIUM CHLORIDE 10 MEQ/100ML IV SOLN
10.0000 meq | Freq: Once | INTRAVENOUS | Status: AC
  Administered 2013-03-20: 10 meq via INTRAVENOUS
  Filled 2013-03-20: qty 100

## 2013-03-20 MED ORDER — HYDROMORPHONE HCL PF 1 MG/ML IJ SOLN
1.0000 mg | Freq: Once | INTRAMUSCULAR | Status: AC
  Administered 2013-03-20: 1 mg via INTRAVENOUS
  Filled 2013-03-20: qty 1

## 2013-03-20 MED ORDER — PROMETHAZINE HCL 25 MG PO TABS: 25.0000 mg | ORAL_TABLET | Freq: Four times a day (QID) | ORAL | Status: DC | PRN

## 2013-03-20 MED ORDER — PROMETHAZINE HCL 25 MG/ML IJ SOLN
25.0000 mg | Freq: Once | INTRAMUSCULAR | Status: AC
  Administered 2013-03-20: 25 mg via INTRAVENOUS
  Filled 2013-03-20: qty 1

## 2013-03-20 MED ORDER — SODIUM CHLORIDE 0.9 % IV BOLUS (SEPSIS)
2000.0000 mL | Freq: Once | INTRAVENOUS | Status: AC
  Administered 2013-03-20: 1000 mL via INTRAVENOUS

## 2013-03-20 NOTE — ED Provider Notes (Signed)
CSN: [id number]     Arrival date & time 03/20/13  1242 History   First MD Initiated Contact with Patient 03/20/13 1247     Chief Complaint  Patient presents with  . Emesis   (Consider location/radiation/quality/duration/timing/severity/associated sxs/prior Treatment) The history is provided by the patient.  Sherry Key is a 27 y.o. female hx of HTN, cyclic vomiting here with vomiting. Vomiting this morning while at work. She said about 8 times today and is clear to yellowish. Denies any blood in the vomit. She also has diffuse, pain and some diarrhea but she commonly gets this when she is vomiting. She has frequent ER visits for vomiting last time was 2 weeks ago. Of note, she went to [facility] a week ago and was admitted. Denies fevers or chills.   Past Medical History  Diagnosis Date  . Hypertension   . Cyclical vomiting    Past Surgical History  Procedure Laterality Date  . Tonsillectomy    . Tubal ligation    . Cesarean section    . Cholecystectomy    . Cesarean section    . Tubal ligation     No family history on file. History  Substance Use Topics  . Smoking status: Never Smoker   . Smokeless tobacco: Never Used  . Alcohol Use: No   OB History   Grav Para Term Preterm Abortions TAB SAB Ect Mult Living                 Review of Systems  Gastrointestinal: Positive for vomiting, abdominal pain and diarrhea.  All other systems reviewed and are negative.    Allergies  Other; Peanut-containing drug products; Percocet; Tomato; and Zofran  Home Medications   Current Outpatient Rx  Name  Route  Sig  Dispense  Refill  . cephALEXin (KEFLEX) 500 MG capsule   Oral   Take 1 capsule (500 mg total) by mouth 2 (two) times daily.   6 capsule   0   . flintstones complete (FLINTSTONES) 60 MG chewable tablet   Oral   Chew 1 tablet by mouth daily.           Marland Kitchen HYDROmorphone HCl (DILAUDID PO)   Oral   Take 100 mg by mouth 2 (two) times daily.         Marland Kitchen  labetalol (NORMODYNE) 100 MG tablet   Oral   Take 100 mg by mouth 2 (two) times daily.           Marland Kitchen LORazepam (ATIVAN) 2 MG tablet   Oral   Take 2 mg by mouth at bedtime as needed. For sleep         . potassium chloride (K-DUR) 10 MEQ tablet   Oral   Take 2 tablets (20 mEq total) by mouth 2 (two) times daily.   10 tablet   0   . EXPIRED: promethazine (PHENERGAN) 25 MG suppository   Rectal   Place 1 suppository (25 mg total) rectally every 4 (four) hours as needed for nausea.   12 each   0   . promethazine (PHENERGAN) 25 MG suppository   Rectal   Place 1 suppository (25 mg total) rectally every 6 (six) hours as needed for nausea.   12 each   0   . promethazine (PHENERGAN) 25 MG tablet   Oral   Take 1 tablet (25 mg total) by mouth every 6 (six) hours as needed for nausea.   30 tablet   0   .  promethazine (PHENERGAN) 25 MG tablet   Oral   Take 1 tablet (25 mg total) by mouth every 6 (six) hours as needed for nausea.   30 tablet   0    BP 170/114  Pulse 108  Temp(Src) 98 F (36.7 C) (Oral)  Resp 16  SpO2 100%  LMP 03/13/2013 Physical Exam  Nursing note and vitals reviewed. Constitutional: She is oriented to person, place, and time.  Thin, tearful, vomiting   HENT:  Head: Normocephalic.  MM slightly dry   Eyes: Conjunctivae are normal. Pupils are equal, round, and reactive to light.  Neck: Normal range of motion. Neck supple.  Cardiovascular: Regular rhythm and normal heart sounds.   Tachycardic   Pulmonary/Chest: Effort normal and breath sounds normal. No respiratory distress. She has no wheezes. She has no rales.  Abdominal:  Voluntary guarding, mild diffuse tenderness, no rebound   Musculoskeletal: Normal range of motion.  Neurological: She is alert and oriented to person, place, and time.  Skin: Skin is warm and dry.  Psychiatric: She has a normal mood and affect. Her behavior is normal. Judgment and thought content normal.    ED Course  Procedures  (including critical care time) Labs Review Labs Reviewed  CBC WITH DIFFERENTIAL - Abnormal; Notable for the following:    WBC 12.9 (*)    Neutrophils Relative % 87 (*)    Lymphocytes Relative 7 (*)    Neutro Abs 11.4 (*)    All other components within normal limits  BASIC METABOLIC PANEL - Abnormal; Notable for the following:    Potassium 3.3 (*)    Glucose, Bld 126 (*)    All other components within normal limits  URINALYSIS, ROUTINE W REFLEX MICROSCOPIC - Abnormal; Notable for the following:    pH 8.5 (*)    Ketones, ur 15 (*)    Leukocytes, UA TRACE (*)    All other components within normal limits  LIPASE, BLOOD  URINE MICROSCOPIC-ADD ON   Imaging Review No results found.  MDM  No diagnosis found. Sherry Key is a 27 y.o. female here with vomiting from her cyclic vomiting syndrome. Will hydrate and check basic labs. She requests phenergan and dilaudid.   3:28 PM Labs at baseline. Felt better after 2 L NS and dilaudid and phenergan. Not vomiting anymore. Has some dilaudid at home. Will d/c home with phenergan.     Wandra Arthurs, MD 03/20/13 [phone]

## 2013-03-20 NOTE — ED Notes (Signed)
Call Timoteo Ace at [phone] when patient is ready to be picked up.

## 2013-03-20 NOTE — ED Notes (Signed)
Brought from work with vomiting. Was discharged a week ago from [facility] for cyclic vomiting.

## 2013-03-20 NOTE — ED Notes (Signed)
Pt. Aware we need urine on her and she reports she cant go at this time.   Pt. Wants RN to call her grandmother.  RN will call as asked by the Pt.

## 2013-03-28 ENCOUNTER — Encounter (HOSPITAL_BASED_OUTPATIENT_CLINIC_OR_DEPARTMENT_OTHER): Payer: Self-pay | Admitting: *Deleted

## 2013-03-28 ENCOUNTER — Emergency Department (HOSPITAL_BASED_OUTPATIENT_CLINIC_OR_DEPARTMENT_OTHER)
Admission: EM | Admit: 2013-03-28 | Discharge: 2013-03-28 | Discharge status: Against Medical Advice | Payer: Medicaid Other | Attending: Emergency Medicine | Admitting: Emergency Medicine

## 2013-03-28 DIAGNOSIS — R1110 Vomiting, unspecified: Secondary | ICD-10-CM | POA: Insufficient documentation

## 2013-03-28 DIAGNOSIS — I10 Essential (primary) hypertension: Secondary | ICD-10-CM | POA: Insufficient documentation

## 2013-03-28 NOTE — ED Notes (Addendum)
Pt c/o vomiting after getting flu shot x 1 day ago HX of same

## 2013-03-28 NOTE — ED Notes (Signed)
Notified by reg clerk that pt left

## 2013-03-30 ENCOUNTER — Emergency Department (HOSPITAL_BASED_OUTPATIENT_CLINIC_OR_DEPARTMENT_OTHER)
Admission: EM | Admit: 2013-03-30 | Discharge: 2013-03-30 | Disposition: A | Discharge status: Home / Self Care | Payer: Medicaid Other | Attending: Emergency Medicine | Admitting: Emergency Medicine

## 2013-03-30 ENCOUNTER — Encounter (HOSPITAL_BASED_OUTPATIENT_CLINIC_OR_DEPARTMENT_OTHER): Payer: Self-pay

## 2013-03-30 ENCOUNTER — Emergency Department (HOSPITAL_BASED_OUTPATIENT_CLINIC_OR_DEPARTMENT_OTHER): Payer: Medicaid Other

## 2013-03-30 DIAGNOSIS — R1115 Cyclical vomiting syndrome unrelated to migraine: Secondary | ICD-10-CM | POA: Insufficient documentation

## 2013-03-30 DIAGNOSIS — Z792 Long term (current) use of antibiotics: Secondary | ICD-10-CM | POA: Insufficient documentation

## 2013-03-30 DIAGNOSIS — Z79899 Other long term (current) drug therapy: Secondary | ICD-10-CM | POA: Insufficient documentation

## 2013-03-30 DIAGNOSIS — I10 Essential (primary) hypertension: Secondary | ICD-10-CM | POA: Insufficient documentation

## 2013-03-30 DIAGNOSIS — R110 Nausea: Secondary | ICD-10-CM | POA: Insufficient documentation

## 2013-03-30 LAB — CBC WITH DIFFERENTIAL/PLATELET
Basophils Absolute: 0 10*3/uL (ref 0.0–0.1)
Basophils Relative: 0 % (ref 0–1)
Eosinophils Relative: 1 % (ref 0–5)
Lymphocytes Relative: 20 % (ref 12–46)
MCHC: 33.8 g/dL (ref 30.0–36.0)
Neutro Abs: 5.4 10*3/uL (ref 1.7–7.7)
Platelets: 224 10*3/uL (ref 150–400)
RDW: 12.2 % (ref 11.5–15.5)
WBC: 7.7 10*3/uL (ref 4.0–10.5)

## 2013-03-30 LAB — COMPREHENSIVE METABOLIC PANEL
Alkaline Phosphatase: 62 U/L (ref 39–117)
BUN: 5 mg/dL — ABNORMAL LOW (ref 6–23)
CO2: 22 mEq/L (ref 19–32)
Chloride: 104 mEq/L (ref 96–112)
Creatinine, Ser: 0.7 mg/dL (ref 0.50–1.10)
GFR calc non Af Amer: >90 mL/min (ref >90)
Glucose, Bld: 100 mg/dL — ABNORMAL HIGH (ref 70–99)
Total Bilirubin: 0.9 mg/dL (ref 0.3–1.2)

## 2013-03-30 LAB — LIPASE, BLOOD: Lipase: 19 U/L (ref 11–59)

## 2013-03-30 MED ORDER — HYDROMORPHONE HCL PF 1 MG/ML IJ SOLN
1.0000 mg | Freq: Once | INTRAMUSCULAR | Status: AC
  Administered 2013-03-30: 1 mg via INTRAVENOUS
  Filled 2013-03-30: qty 1

## 2013-03-30 MED ORDER — SODIUM CHLORIDE 0.9 % IV SOLN: INTRAVENOUS | Status: DC

## 2013-03-30 MED ORDER — PROMETHAZINE HCL 25 MG/ML IJ SOLN
12.5000 mg | Freq: Once | INTRAMUSCULAR | Status: AC
  Administered 2013-03-30: 12.5 mg via INTRAVENOUS
  Filled 2013-03-30: qty 1

## 2013-03-30 MED ORDER — SODIUM CHLORIDE 0.9 % IV BOLUS (SEPSIS)
1000.0000 mL | Freq: Once | INTRAVENOUS | Status: AC
  Administered 2013-03-30: 1000 mL via INTRAVENOUS

## 2013-03-30 NOTE — ED Provider Notes (Signed)
CSN: [id number]     Arrival date & time 03/30/13  1043 History   First MD Initiated Contact with Patient 03/30/13 1137     Chief Complaint  Patient presents with  . Abdominal Pain   (Consider location/radiation/quality/duration/timing/severity/associated sxs/prior Treatment) Patient is a 27 y.o. female presenting with abdominal pain. The history is provided by the patient.  Abdominal Pain Associated symptoms: nausea and vomiting   Associated symptoms: no chest pain, no diarrhea, no dysuria, no fever and no shortness of breath    patient with a history of cyclic vomiting syndrome. Symptoms seem to be consistent with that. Patient talks about increasing left upper quadrant abdominal pain and also frequent vomiting. Patient states she has a cyst on her pancreas is concerned that that is worse states that has not been evaluated recently. Patient last seen on September 11 here for cyclic vomiting syndrome. The chart was reviewed. Patient states abdominal pain is 10 out of 10. No blood in vomit no diarrhea. Pain is described as sharp. Nothing makes it better or worse.  Past Medical History  Diagnosis Date  . Hypertension   . Cyclical vomiting    Past Surgical History  Procedure Laterality Date  . Tonsillectomy    . Tubal ligation    . Cesarean section    . Cholecystectomy    . Cesarean section    . Tubal ligation     No family history on file. History  Substance Use Topics  . Smoking status: Never Smoker   . Smokeless tobacco: Never Used  . Alcohol Use: No   OB History   Grav Para Term Preterm Abortions TAB SAB Ect Mult Living                 Review of Systems  Constitutional: Negative for fever.  HENT: Negative for neck pain.   Respiratory: Negative for shortness of breath.   Cardiovascular: Negative for chest pain.  Gastrointestinal: Positive for nausea, vomiting and abdominal pain. Negative for diarrhea.  Genitourinary: Negative for dysuria.  Musculoskeletal: Negative for  back pain.  Skin: Negative for rash.  Neurological: Negative for headaches.  Hematological: Does not bruise/bleed easily.  Psychiatric/Behavioral: Negative for confusion.    Allergies  Other; Peanut-containing drug products; Percocet; Tomato; and Zofran  Home Medications   Current Outpatient Rx  Name  Route  Sig  Dispense  Refill  . cephALEXin (KEFLEX) 500 MG capsule   Oral   Take 1 capsule (500 mg total) by mouth 2 (two) times daily.   6 capsule   0   . flintstones complete (FLINTSTONES) 60 MG chewable tablet   Oral   Chew 1 tablet by mouth daily.           Marland Kitchen HYDROmorphone HCl (DILAUDID PO)   Oral   Take 100 mg by mouth 2 (two) times daily.         Marland Kitchen labetalol (NORMODYNE) 100 MG tablet   Oral   Take 100 mg by mouth 2 (two) times daily.           Marland Kitchen LORazepam (ATIVAN) 2 MG tablet   Oral   Take 2 mg by mouth at bedtime as needed. For sleep         . potassium chloride (K-DUR) 10 MEQ tablet   Oral   Take 2 tablets (20 mEq total) by mouth 2 (two) times daily.   10 tablet   0   . EXPIRED: promethazine (PHENERGAN) 25 MG suppository   Rectal  Place 1 suppository (25 mg total) rectally every 4 (four) hours as needed for nausea.   12 each   0   . promethazine (PHENERGAN) 25 MG suppository   Rectal   Place 1 suppository (25 mg total) rectally every 6 (six) hours as needed for nausea.   12 each   0   . promethazine (PHENERGAN) 25 MG tablet   Oral   Take 1 tablet (25 mg total) by mouth every 6 (six) hours as needed for nausea.   30 tablet   0   . promethazine (PHENERGAN) 25 MG tablet   Oral   Take 1 tablet (25 mg total) by mouth every 6 (six) hours as needed for nausea.   30 tablet   0   . promethazine (PHENERGAN) 25 MG tablet   Oral   Take 1 tablet (25 mg total) by mouth every 6 (six) hours as needed for nausea.   30 tablet   0    BP 133/107  Pulse 96  Temp(Src) 98 F (36.7 C) (Oral)  Resp 20  SpO2 99%  LMP 03/13/2013 Physical Exam   Nursing note and vitals reviewed. Constitutional: She is oriented to person, place, and time. She appears well-developed and well-nourished. She appears distressed.  HENT:  Head: Normocephalic.  Left Ear: External ear normal.  Mucous membranes dry.  Eyes: Conjunctivae and EOM are normal. Pupils are equal, round, and reactive to light.  Neck: Normal range of motion.  Cardiovascular: Normal rate and regular rhythm.   No murmur heard. Pulmonary/Chest: Effort normal and breath sounds normal.  Abdominal: Soft. Bowel sounds are normal. There is tenderness.  Diffuse tenderness.  Musculoskeletal: Normal range of motion.  Neurological: She is alert and oriented to person, place, and time. No cranial nerve deficit. She exhibits normal muscle tone. Coordination normal.  Skin: Skin is warm. No rash noted.    ED Course  Procedures (including critical care time) Labs Review Labs Reviewed  COMPREHENSIVE METABOLIC PANEL - Abnormal; Notable for the following:    Glucose, Bld 100 (*)    BUN 5 (*)    All other components within normal limits  LIPASE, BLOOD  CBC WITH DIFFERENTIAL   Results for orders placed during the hospital encounter of 03/30/13  COMPREHENSIVE METABOLIC PANEL      Result Value Range   Sodium 138  135 - 145 mEq/L   Potassium 3.6  3.5 - 5.1 mEq/L   Chloride 104  96 - 112 mEq/L   CO2 22  19 - 32 mEq/L   Glucose, Bld 100 (*) 70 - 99 mg/dL   BUN 5 (*) 6 - 23 mg/dL   Creatinine, Ser 0.70  0.50 - 1.10 mg/dL   Calcium 9.6  8.4 - 10.5 mg/dL   Total Protein 7.0  6.0 - 8.3 g/dL   Albumin 3.7  3.5 - 5.2 g/dL   AST 13  0 - 37 U/L   ALT 6  0 - 35 U/L   Alkaline Phosphatase 62  39 - 117 U/L   Total Bilirubin 0.9  0.3 - 1.2 mg/dL   GFR calc non Af Amer >90  >90 mL/min   GFR calc Af Amer >90  >90 mL/min  LIPASE, BLOOD      Result Value Range   Lipase 19  11 - 59 U/L  CBC WITH DIFFERENTIAL      Result Value Range   WBC 7.7  4.0 - 10.5 K/uL   RBC 3.95  3.87 - 5.11 MIL/uL  Hemoglobin 12.2  12.0 - 15.0 g/dL   HCT 36.1  36.0 - 46.0 %   MCV 91.4  78.0 - 100.0 fL   MCH 30.9  26.0 - 34.0 pg   MCHC 33.8  30.0 - 36.0 g/dL   RDW 12.2  11.5 - 15.5 %   Platelets 224  150 - 400 K/uL   Neutrophils Relative % 70  43 - 77 %   Neutro Abs 5.4  1.7 - 7.7 K/uL   Lymphocytes Relative 20  12 - 46 %   Lymphs Abs 1.6  0.7 - 4.0 K/uL   Monocytes Relative 8  3 - 12 %   Monocytes Absolute 0.6  0.1 - 1.0 K/uL   Eosinophils Relative 1  0 - 5 %   Eosinophils Absolute 0.1  0.0 - 0.7 K/uL   Basophils Relative 0  0 - 1 %   Basophils Absolute 0.0  0.0 - 0.1 K/uL    Imaging Review No results found.  MDM   1. Cyclic vomiting syndrome    Followup with your new GI Dr. Sinclair Grooms current medications. Workup here today without significant lab abnormalities no leukocytosis no evidence of pancreatitis no liver function test abnormalities renal function is normal no evidence of significant dehydration. Had planned on doing a CT of your abdomen and pelvis here today but that we are informed by radiology to give one done at [facility] 2 days ago. Which she did not tells about even though we asked. CT not done today. CT scan at [facility] had no significant Shawn Route is no change in the pancreatic cyst of 1 cm in size. This appears to be benign.  Improved with IV fluids Phenergan and pain medication. Symptoms most likely consistent with near-normal cyclic vomiting syndrome.    Mervin Kung, MD 03/30/13 [phone]

## 2013-03-30 NOTE — ED Notes (Signed)
Patient here with increasing LUQ pain that she thinks is related to her cyst on her pancreas. Reports that this discomfort and nausea is different from her regular cyclic vomiting

## 2013-04-30 ENCOUNTER — Emergency Department (HOSPITAL_BASED_OUTPATIENT_CLINIC_OR_DEPARTMENT_OTHER)
Admission: EM | Admit: 2013-04-30 | Discharge: 2013-04-30 | Disposition: A | Discharge status: Home / Self Care | Payer: Medicaid Other | Attending: Emergency Medicine | Admitting: Emergency Medicine

## 2013-04-30 ENCOUNTER — Encounter (HOSPITAL_BASED_OUTPATIENT_CLINIC_OR_DEPARTMENT_OTHER): Payer: Self-pay | Admitting: Emergency Medicine

## 2013-04-30 DIAGNOSIS — R1084 Generalized abdominal pain: Secondary | ICD-10-CM | POA: Insufficient documentation

## 2013-04-30 DIAGNOSIS — Z9851 Tubal ligation status: Secondary | ICD-10-CM | POA: Insufficient documentation

## 2013-04-30 DIAGNOSIS — Z79899 Other long term (current) drug therapy: Secondary | ICD-10-CM | POA: Insufficient documentation

## 2013-04-30 DIAGNOSIS — I10 Essential (primary) hypertension: Secondary | ICD-10-CM | POA: Insufficient documentation

## 2013-04-30 DIAGNOSIS — Z9889 Other specified postprocedural states: Secondary | ICD-10-CM | POA: Insufficient documentation

## 2013-04-30 DIAGNOSIS — R1115 Cyclical vomiting syndrome unrelated to migraine: Secondary | ICD-10-CM | POA: Insufficient documentation

## 2013-04-30 LAB — COMPREHENSIVE METABOLIC PANEL
ALT: 11 U/L (ref 0–35)
AST: 17 U/L (ref 0–37)
Albumin: 4.4 g/dL (ref 3.5–5.2)
Alkaline Phosphatase: 74 U/L (ref 39–117)
CO2: 25 mEq/L (ref 19–32)
Chloride: 103 mEq/L (ref 96–112)
Creatinine, Ser: 0.7 mg/dL (ref 0.50–1.10)
GFR calc non Af Amer: >90 mL/min (ref >90)
Potassium: 4.2 mEq/L (ref 3.5–5.1)
Total Bilirubin: 0.4 mg/dL (ref 0.3–1.2)

## 2013-04-30 LAB — CBC WITH DIFFERENTIAL/PLATELET
Basophils Absolute: 0 10*3/uL (ref 0.0–0.1)
Basophils Relative: 0 % (ref 0–1)
HCT: 41.5 % (ref 36.0–46.0)
Hemoglobin: 13.8 g/dL (ref 12.0–15.0)
Lymphocytes Relative: 18 % (ref 12–46)
MCHC: 33.3 g/dL (ref 30.0–36.0)
Monocytes Absolute: 0.6 10*3/uL (ref 0.1–1.0)
Neutro Abs: 6.1 10*3/uL (ref 1.7–7.7)
Neutrophils Relative %: 74 % (ref 43–77)
RDW: 12.3 % (ref 11.5–15.5)
WBC: 8.1 10*3/uL (ref 4.0–10.5)

## 2013-04-30 MED ORDER — DIPHENHYDRAMINE HCL 50 MG/ML IJ SOLN
25.0000 mg | Freq: Once | INTRAMUSCULAR | Status: AC
  Administered 2013-04-30: 25 mg via INTRAVENOUS
  Filled 2013-04-30: qty 1

## 2013-04-30 MED ORDER — SODIUM CHLORIDE 0.9 % IV BOLUS (SEPSIS)
1000.0000 mL | Freq: Once | INTRAVENOUS | Status: AC
  Administered 2013-04-30: 1000 mL via INTRAVENOUS

## 2013-04-30 MED ORDER — HYDROMORPHONE HCL PF 1 MG/ML IJ SOLN
1.0000 mg | Freq: Once | INTRAMUSCULAR | Status: AC
  Administered 2013-04-30: 1 mg via INTRAVENOUS
  Filled 2013-04-30: qty 1

## 2013-04-30 MED ORDER — PROMETHAZINE HCL 25 MG PO TABS: 25.0000 mg | ORAL_TABLET | Freq: Four times a day (QID) | ORAL | Status: DC | PRN

## 2013-04-30 MED ORDER — PROMETHAZINE HCL 25 MG/ML IJ SOLN
12.5000 mg | Freq: Once | INTRAMUSCULAR | Status: AC
  Administered 2013-04-30: 12.5 mg via INTRAVENOUS
  Filled 2013-04-30: qty 1

## 2013-04-30 MED ORDER — PROMETHAZINE HCL 25 MG/ML IJ SOLN
50.0000 mg | Freq: Once | INTRAMUSCULAR | Status: AC
  Administered 2013-04-30: 50 mg via INTRAMUSCULAR
  Filled 2013-04-30: qty 2

## 2013-04-30 NOTE — ED Notes (Signed)
Vomiting since yesterday

## 2013-05-01 NOTE — ED Provider Notes (Signed)
CSN: [id number]     Arrival date & time 04/30/13  1001 History   First MD Initiated Contact with Patient 04/30/13 1038     Chief Complaint  Patient presents with  . Emesis    Vomiting since 1pm yesterday    HPI  Vision has a history of cyclic vomiting syndrome. Exacerbation of symptoms with this is been going on for the last 48 hours. She took something at home yesterday and has relief. Vomiting this morning. Became painful. Presents here. She denies fever. She denies hematemesis. No diarrhea. No additional symptoms. Pain is diffuse and intermittent not localizing.  Past Medical History  Diagnosis Date  . Hypertension   . Cyclical vomiting    Past Surgical History  Procedure Laterality Date  . Tonsillectomy    . Tubal ligation    . Cesarean section    . Cholecystectomy    . Cesarean section    . Tubal ligation     No family history on file. History  Substance Use Topics  . Smoking status: Never Smoker   . Smokeless tobacco: Never Used  . Alcohol Use: No   OB History   Grav Para Term Preterm Abortions TAB SAB Ect Mult Living                 Review of Systems  Constitutional: Negative for fever, chills, diaphoresis, appetite change and fatigue.  HENT: Negative for mouth sores, sore throat and trouble swallowing.   Eyes: Negative for visual disturbance.  Respiratory: Negative for cough, chest tightness, shortness of breath and wheezing.   Cardiovascular: Negative for chest pain.  Gastrointestinal: Positive for nausea, vomiting and abdominal pain. Negative for diarrhea and abdominal distention.  Endocrine: Negative for polydipsia, polyphagia and polyuria.  Genitourinary: Negative for dysuria, frequency and hematuria.  Musculoskeletal: Negative for gait problem.  Skin: Negative for color change, pallor and rash.  Neurological: Negative for dizziness, syncope, light-headedness and headaches.  Hematological: Does not bruise/bleed easily.  Psychiatric/Behavioral: Negative  for behavioral problems and confusion.    Allergies  Other; Peanut-containing drug products; Percocet; Tomato; and Zofran  Home Medications   Current Outpatient Rx  Name  Route  Sig  Dispense  Refill  . promethazine (PHENERGAN) 25 MG tablet   Oral   Take 1 tablet (25 mg total) by mouth every 6 (six) hours as needed for nausea.   30 tablet   0   . cephALEXin (KEFLEX) 500 MG capsule   Oral   Take 1 capsule (500 mg total) by mouth 2 (two) times daily.   6 capsule   0   . flintstones complete (FLINTSTONES) 60 MG chewable tablet   Oral   Chew 1 tablet by mouth daily.           Marland Kitchen HYDROmorphone HCl (DILAUDID PO)   Oral   Take 100 mg by mouth 2 (two) times daily.         Marland Kitchen labetalol (NORMODYNE) 100 MG tablet   Oral   Take 100 mg by mouth 2 (two) times daily.           Marland Kitchen LORazepam (ATIVAN) 2 MG tablet   Oral   Take 2 mg by mouth at bedtime as needed. For sleep         . potassium chloride (K-DUR) 10 MEQ tablet   Oral   Take 2 tablets (20 mEq total) by mouth 2 (two) times daily.   10 tablet   0   . EXPIRED: promethazine (PHENERGAN)  25 MG suppository   Rectal   Place 1 suppository (25 mg total) rectally every 4 (four) hours as needed for nausea.   12 each   0   . promethazine (PHENERGAN) 25 MG suppository   Rectal   Place 1 suppository (25 mg total) rectally every 6 (six) hours as needed for nausea.   12 each   0   . promethazine (PHENERGAN) 25 MG tablet   Oral   Take 1 tablet (25 mg total) by mouth every 6 (six) hours as needed for nausea.   30 tablet   0   . promethazine (PHENERGAN) 25 MG tablet   Oral   Take 1 tablet (25 mg total) by mouth every 6 (six) hours as needed for nausea.   30 tablet   0   . promethazine (PHENERGAN) 25 MG tablet   Oral   Take 1 tablet (25 mg total) by mouth every 6 (six) hours as needed for nausea.   30 tablet   0    BP 149/107  Pulse 91  Temp(Src) 97.8 F (36.6 C) (Oral)  Resp 20  SpO2 100%  LMP  04/30/2013 Physical Exam  Constitutional: She is oriented to person, place, and time. She appears well-developed and well-nourished. No distress.  Initially sitting upright. Appears in no distress. Conversant  HENT:  Head: Normocephalic.  Eyes: Conjunctivae are normal. Pupils are equal, round, and reactive to light. No scleral icterus.  Neck: Normal range of motion. Neck supple. No thyromegaly present.  Cardiovascular: Normal rate and regular rhythm.  Exam reveals no gallop and no friction rub.   No murmur heard. Pulmonary/Chest: Effort normal and breath sounds normal. No respiratory distress. She has no wheezes. She has no rales.  Abdominal: Soft. Bowel sounds are normal. She exhibits no distension. There is no tenderness. There is no rebound.  A soft abdomen diffusely. Normal active bowel sounds. She last ate at examiner without hesitation describe her tenderness is "10 over 10". Absolute not a peritoneal abdominal exam.  Musculoskeletal: Normal range of motion.  Neurological: She is alert and oriented to person, place, and time.  Skin: Skin is warm and dry. No rash noted.  Psychiatric: She has a normal mood and affect. Her behavior is normal.    ED Course  Procedures (including critical care time) Labs Review Labs Reviewed  CBC WITH DIFFERENTIAL  COMPREHENSIVE METABOLIC PANEL  LIPASE, BLOOD   Imaging Review No results found.  EKG Interpretation   None       MDM   1. Cyclic vomiting syndrome    Her labs are all normal. She has no leukocytosis. She denied any additional nausea although one point in her shoulder that she had told the nurses she thrown up. Her main complaints are the most her department stay with pain. At examiner she is resting and actually sleeping. Her repeat exam hour later she is taking some by mouth fluids. She states her pain is unresolved. I think that expectant management at home as all this indicated.    Lebron Quam, MD 05/01/13 [phone]

## 2013-05-22 ENCOUNTER — Encounter (HOSPITAL_BASED_OUTPATIENT_CLINIC_OR_DEPARTMENT_OTHER): Payer: Self-pay | Admitting: Emergency Medicine

## 2013-05-22 ENCOUNTER — Emergency Department (HOSPITAL_BASED_OUTPATIENT_CLINIC_OR_DEPARTMENT_OTHER)
Admission: EM | Admit: 2013-05-22 | Discharge: 2013-05-22 | Disposition: A | Discharge status: Home / Self Care | Payer: Medicaid Other | Attending: Emergency Medicine | Admitting: Emergency Medicine

## 2013-05-22 DIAGNOSIS — R1012 Left upper quadrant pain: Secondary | ICD-10-CM | POA: Insufficient documentation

## 2013-05-22 DIAGNOSIS — I10 Essential (primary) hypertension: Secondary | ICD-10-CM | POA: Insufficient documentation

## 2013-05-22 DIAGNOSIS — R51 Headache: Secondary | ICD-10-CM | POA: Insufficient documentation

## 2013-05-22 DIAGNOSIS — R1031 Right lower quadrant pain: Secondary | ICD-10-CM | POA: Insufficient documentation

## 2013-05-22 DIAGNOSIS — R1115 Cyclical vomiting syndrome unrelated to migraine: Secondary | ICD-10-CM

## 2013-05-22 DIAGNOSIS — M549 Dorsalgia, unspecified: Secondary | ICD-10-CM | POA: Insufficient documentation

## 2013-05-22 DIAGNOSIS — Z79899 Other long term (current) drug therapy: Secondary | ICD-10-CM | POA: Insufficient documentation

## 2013-05-22 DIAGNOSIS — R197 Diarrhea, unspecified: Secondary | ICD-10-CM | POA: Insufficient documentation

## 2013-05-22 LAB — URINALYSIS, ROUTINE W REFLEX MICROSCOPIC
Bilirubin Urine: NEGATIVE
Glucose, UA: NEGATIVE mg/dL
Ketones, ur: NEGATIVE mg/dL
Protein, ur: NEGATIVE mg/dL
pH: 7 (ref 5.0–8.0)

## 2013-05-22 LAB — COMPREHENSIVE METABOLIC PANEL
AST: 18 U/L (ref 0–37)
BUN: 14 mg/dL (ref 6–23)
CO2: 25 mEq/L (ref 19–32)
Calcium: 9.4 mg/dL (ref 8.4–10.5)
Chloride: 102 mEq/L (ref 96–112)
Creatinine, Ser: 0.8 mg/dL (ref 0.50–1.10)
GFR calc non Af Amer: >90 mL/min (ref >90)
Glucose, Bld: 104 mg/dL — ABNORMAL HIGH (ref 70–99)
Total Bilirubin: 0.3 mg/dL (ref 0.3–1.2)

## 2013-05-22 LAB — URINE MICROSCOPIC-ADD ON

## 2013-05-22 LAB — CBC
HCT: 39.8 % (ref 36.0–46.0)
MCV: 92.8 fL (ref 78.0–100.0)
Platelets: 229 10*3/uL (ref 150–400)
RBC: 4.29 MIL/uL (ref 3.87–5.11)
WBC: 8.2 10*3/uL (ref 4.0–10.5)

## 2013-05-22 MED ORDER — HYDROMORPHONE HCL PF 1 MG/ML IJ SOLN
1.0000 mg | Freq: Once | INTRAMUSCULAR | Status: AC
  Administered 2013-05-22: 1 mg via INTRAVENOUS
  Filled 2013-05-22: qty 1

## 2013-05-22 MED ORDER — PROMETHAZINE HCL 25 MG RE SUPP: 25.0000 mg | RECTAL | Status: DC | PRN

## 2013-05-22 MED ORDER — PROMETHAZINE HCL 25 MG PO TABS: 25.0000 mg | ORAL_TABLET | Freq: Four times a day (QID) | ORAL | Status: DC | PRN

## 2013-05-22 MED ORDER — SODIUM CHLORIDE 0.9 % IV BOLUS (SEPSIS)
1000.0000 mL | Freq: Once | INTRAVENOUS | Status: AC
  Administered 2013-05-22: 1000 mL via INTRAVENOUS

## 2013-05-22 MED ORDER — PROMETHAZINE HCL 25 MG/ML IJ SOLN
25.0000 mg | Freq: Once | INTRAMUSCULAR | Status: AC
  Administered 2013-05-22: 25 mg via INTRAVENOUS
  Filled 2013-05-22: qty 1

## 2013-05-22 NOTE — ED Provider Notes (Signed)
I saw and evaluated the patient, reviewed the resident's note and I agree with the findings and plan. Patient is a 27 year old female with past medical history of cyclic vomiting. She is frequently in the emergency department with exacerbations of this. She started this morning with vomiting, abdominal pain, and diarrhea. She denies any fevers or chills. She denies any bloody vomit or stool.  On exam, patient is afebrile vitals are stable. Heart is regular rate and rhythm and lungs are clear. Abdomen is noted to be Tender in all 4 quadrants without rebound or guarding. Bowel sounds are present. Mucous membranes are moist and skin has good turgor.  She was given IV fluids, Phenergan, and dilaudid as this is what has worked for her in the past. Laboratory studies revealed only a mildly elevated lipase but no other abnormality. She began to feel better with the above treatment and is now requesting to be discharged. She will be discharged to home with anti-emetics and as needed followup.     Veryl Speak, MD 05/22/13 1000

## 2013-05-22 NOTE — ED Notes (Signed)
Pt requests d/c home, md is at bedside. Iv d/c, pt assisted to call for ride home.

## 2013-05-22 NOTE — ED Provider Notes (Signed)
CSN: [id number]     Arrival date & time 05/22/13  0732 History   First MD Initiated Contact with Patient 05/22/13 0747     Chief Complaint  Patient presents with  . Emesis  . Diarrhea  . Abdominal Pain  . Back Pain   (Consider location/radiation/quality/duration/timing/severity/associated sxs/prior Treatment) Patient is a 27 y.o. female presenting with vomiting, diarrhea, abdominal pain, and back pain.  Emesis Associated symptoms: abdominal pain and diarrhea   Associated symptoms: no chills, no headaches and no sore throat   Diarrhea Associated symptoms: abdominal pain and vomiting   Associated symptoms: no chills, no fever and no headaches   Abdominal Pain Associated symptoms: diarrhea and vomiting   Associated symptoms: no chest pain, no chills, no cough, no dysuria, no fever, no shortness of breath and no sore throat   Back Pain Associated symptoms: abdominal pain   Associated symptoms: no chest pain, no dysuria, no fever and no headaches     27 year old female with history of cyclic vomiting syndrome here with nausea, vomiting, abdominal pain, and back pain. She describes her abd pain as sharp, RLQ and LUQ 10/10 pain, non radiating, increased with vomiting and only alleviated with dilaudid and anti emetics. SHe describes her back pain as low mid back pain on both sides radiating down her BL legs. She denies bowel or bladder dysfunction, saddle anesthesia, or lower extremity weakness or numbness. She takes that she's had 3 episodes of nonbloody nonbilious emesis today. She also complains of a headache. States that in general herself with vomiting episodes began with a headache, abdominal pain, back pain, and then she begins to vomit repeatedly being unable to keep down any food or fluids.   Past Medical History  Diagnosis Date  . Hypertension   . Cyclical vomiting    Past Surgical History  Procedure Laterality Date  . Tonsillectomy    . Tubal ligation    . Cesarean section     . Cholecystectomy    . Cesarean section    . Tubal ligation     No family history on file. History  Substance Use Topics  . Smoking status: Never Smoker   . Smokeless tobacco: Never Used  . Alcohol Use: No   OB History   Grav Para Term Preterm Abortions TAB SAB Ect Mult Living                 Review of Systems  Constitutional: Negative for fever, chills and appetite change.  HENT: Negative for sore throat.   Respiratory: Negative for cough and shortness of breath.   Cardiovascular: Negative for chest pain.  Gastrointestinal: Positive for vomiting, abdominal pain and diarrhea.  Genitourinary: Negative for dysuria.  Musculoskeletal: Positive for back pain.  Neurological: Negative for headaches.  All other systems reviewed and are negative.    Allergies  Other; Peanut-containing drug products; Percocet; Tomato; and Zofran  Home Medications   Current Outpatient Rx  Name  Route  Sig  Dispense  Refill  . HYDROmorphone HCl (DILAUDID PO)   Oral   Take 100 mg by mouth 2 (two) times daily.         . flintstones complete (FLINTSTONES) 60 MG chewable tablet   Oral   Chew 1 tablet by mouth daily.           Marland Kitchen EXPIRED: promethazine (PHENERGAN) 25 MG suppository   Rectal   Place 1 suppository (25 mg total) rectally every 4 (four) hours as needed for nausea.   12  each   0   . promethazine (PHENERGAN) 25 MG tablet   Oral   Take 1 tablet (25 mg total) by mouth every 6 (six) hours as needed for nausea.   30 tablet   0    BP 152/115  Pulse 90  Temp(Src) 98.8 F (37.1 C)  Resp 18  Ht 5' 3"  (1.6 m)  Wt 138 lb (62.596 kg)  BMI 24.45 kg/m2  SpO2 100%  LMP 04/30/2013 Physical Exam  Constitutional: She is oriented to person, place, and time. She appears well-developed and well-nourished. No distress.  HENT:  Head: Normocephalic and atraumatic.  Eyes: EOM are normal. Pupils are equal, round, and reactive to light.  Neck: Neck supple.  Cardiovascular: Normal rate,  regular rhythm and normal heart sounds.   No murmur heard. Pulmonary/Chest: Effort normal and breath sounds normal.  Abdominal: Soft. Bowel sounds are normal. There is tenderness. There is no guarding.  Musculoskeletal: She exhibits no edema.  Neurological: She is alert and oriented to person, place, and time. She has normal strength. She exhibits normal muscle tone.  Skin: Skin is warm and dry. She is not diaphoretic.  Psychiatric: She has a normal mood and affect.    ED Course  Procedures (including critical care time) Labs Review Labs Reviewed - No data to display Imaging Review No results found.  EKG Interpretation   None       MDM  No diagnosis found.  27 y/o female here with apparent exacerbation of cyclic vomiting syndrome vs opiate withdrawal and worsening of her chronic pain. Although it appears all of her pains are worsened by the absence of pain meds we will evaluate her abd pain with CMP, lipase, CBC, and a UA.   Given IV dilaudid, NS bolus, and IV phenergan  Patient received a call from school and states that her son is throwing up as well so she beleuievs there is a viral component. She was able to tolerate PO fluids here.   CBC, CMP WNL, Lipase slightly elevated but not so elevated that pancreatitis is a reasonable explanation.   Discussed plenty of fluids and red flags.  Rx for PO and rectal phenergan given.   Laroy Apple, MD [facility] Resident, PGY-2 05/22/2013, 9:41 AM        Timmothy Euler, MD 05/22/13 [phone]

## 2013-05-22 NOTE — ED Notes (Signed)
Pt given 100cc ginger ale per request for po trial.

## 2013-05-22 NOTE — ED Notes (Signed)
Pt reports nausea, vomiting and diarrhea that started yesterday.  States she is out of Dilaudid and Phenergan.

## 2013-05-22 NOTE — ED Notes (Signed)
MD at bedside. 

## 2013-05-23 LAB — URINE CULTURE

## 2013-05-27 ENCOUNTER — Emergency Department (HOSPITAL_BASED_OUTPATIENT_CLINIC_OR_DEPARTMENT_OTHER)
Admission: EM | Admit: 2013-05-27 | Discharge: 2013-05-27 | Disposition: A | Discharge status: Home / Self Care | Payer: Medicaid Other | Attending: Emergency Medicine | Admitting: Emergency Medicine

## 2013-05-27 ENCOUNTER — Encounter (HOSPITAL_BASED_OUTPATIENT_CLINIC_OR_DEPARTMENT_OTHER): Payer: Self-pay | Admitting: Emergency Medicine

## 2013-05-27 DIAGNOSIS — I10 Essential (primary) hypertension: Secondary | ICD-10-CM | POA: Insufficient documentation

## 2013-05-27 DIAGNOSIS — R1084 Generalized abdominal pain: Secondary | ICD-10-CM | POA: Insufficient documentation

## 2013-05-27 DIAGNOSIS — Z79899 Other long term (current) drug therapy: Secondary | ICD-10-CM | POA: Insufficient documentation

## 2013-05-27 DIAGNOSIS — R1115 Cyclical vomiting syndrome unrelated to migraine: Secondary | ICD-10-CM | POA: Insufficient documentation

## 2013-05-27 DIAGNOSIS — Z3202 Encounter for pregnancy test, result negative: Secondary | ICD-10-CM | POA: Insufficient documentation

## 2013-05-27 LAB — URINALYSIS, ROUTINE W REFLEX MICROSCOPIC
Bilirubin Urine: NEGATIVE
Glucose, UA: NEGATIVE mg/dL
Ketones, ur: NEGATIVE mg/dL
Nitrite: NEGATIVE
Protein, ur: NEGATIVE mg/dL
Specific Gravity, Urine: 1.02 (ref 1.005–1.030)
Urobilinogen, UA: 0.2 mg/dL (ref 0.0–1.0)

## 2013-05-27 LAB — URINE MICROSCOPIC-ADD ON

## 2013-05-27 LAB — PREGNANCY, URINE: Preg Test, Ur: NEGATIVE

## 2013-05-27 MED ORDER — HYDROMORPHONE HCL PF 1 MG/ML IJ SOLN
1.0000 mg | Freq: Once | INTRAMUSCULAR | Status: AC
  Administered 2013-05-27: 1 mg via INTRAMUSCULAR
  Filled 2013-05-27: qty 1

## 2013-05-27 MED ORDER — PROMETHAZINE HCL 25 MG/ML IJ SOLN
25.0000 mg | Freq: Once | INTRAMUSCULAR | Status: AC
  Administered 2013-05-27: 25 mg via INTRAMUSCULAR
  Filled 2013-05-27: qty 1

## 2013-05-27 NOTE — ED Provider Notes (Signed)
Medical screening examination/treatment/procedure(s) were performed by non-physician practitioner and as supervising physician I was immediately available for consultation/collaboration.  EKG Interpretation   None         Charles B. Karle Starch, MD 05/27/13 [phone]

## 2013-05-27 NOTE — ED Notes (Signed)
Patient states she has chronic cyclic vomiting syndrome.  States she can not keep her medications down to control her vomiting and generalized pain.

## 2013-05-27 NOTE — ED Provider Notes (Signed)
CSN: [id number]     Arrival date & time 05/27/13  1108 History   First MD Initiated Contact with Patient 05/27/13 1156     Chief Complaint  Patient presents with  . Emesis   (Consider location/radiation/quality/duration/timing/severity/associated sxs/prior Treatment) Patient is a 27 y.o. female presenting with vomiting. The history is provided by the patient. No language interpreter was used.  Emesis Severity:  Moderate Associated symptoms: abdominal pain   Associated symptoms: no chills   Associated symptoms comment:  She is having symptoms typical of cyclic vomiting syndrome without fever. She states her symptoms are rarely controlled but she is unable to hold down any phenergan currently. She states she cannot use ODT Zofran or phenergan suppositories secondary to skin reactions. No hematemesis.   Past Medical History  Diagnosis Date  . Hypertension   . Cyclical vomiting    Past Surgical History  Procedure Laterality Date  . Tonsillectomy    . Tubal ligation    . Cesarean section    . Cholecystectomy    . Cesarean section    . Tubal ligation     No family history on file. History  Substance Use Topics  . Smoking status: Never Smoker   . Smokeless tobacco: Never Used  . Alcohol Use: No   OB History   Grav Para Term Preterm Abortions TAB SAB Ect Mult Living                 Review of Systems  Constitutional: Negative for fever and chills.  Respiratory: Negative.   Cardiovascular: Negative.   Gastrointestinal: Positive for nausea, vomiting and abdominal pain.  Musculoskeletal: Negative.   Skin: Negative.   Neurological: Negative.     Allergies  Other; Peanut-containing drug products; Percocet; Tomato; and Zofran  Home Medications   Current Outpatient Rx  Name  Route  Sig  Dispense  Refill  . flintstones complete (FLINTSTONES) 60 MG chewable tablet   Oral   Chew 1 tablet by mouth daily.           Marland Kitchen HYDROmorphone HCl (DILAUDID PO)   Oral   Take 100 mg  by mouth 2 (two) times daily.         . promethazine (PHENERGAN) 25 MG suppository   Rectal   Place 1 suppository (25 mg total) rectally every 4 (four) hours as needed for nausea.   12 each   0   . promethazine (PHENERGAN) 25 MG tablet   Oral   Take 1 tablet (25 mg total) by mouth every 6 (six) hours as needed for nausea.   30 tablet   0    BP 151/109  Pulse 88  Temp(Src) 98.5 F (36.9 C) (Oral)  Resp 22  SpO2 100%  LMP 04/25/2013 Physical Exam  Constitutional: She is oriented to person, place, and time. She appears well-developed and well-nourished. No distress.  HENT:  Mouth/Throat: Oropharynx is clear and moist.  Neck: Normal range of motion.  Pulmonary/Chest: Effort normal.  Abdominal: Soft.  Diffusely tender, soft abdomen. BS hypoactive. No distention.  Neurological: She is alert and oriented to person, place, and time.  Skin: Skin is warm and dry.  Psychiatric: She has a normal mood and affect.    ED Course  Procedures (including critical care time) Labs Review Labs Reviewed  PREGNANCY, URINE  URINALYSIS, ROUTINE W REFLEX MICROSCOPIC   Results for orders placed during the hospital encounter of 05/27/13  URINALYSIS, ROUTINE W REFLEX MICROSCOPIC      Result Value Range  Color, Urine YELLOW  YELLOW   APPearance CLEAR  CLEAR   Specific Gravity, Urine 1.020  1.005 - 1.030   pH 6.0  5.0 - 8.0   Glucose, UA NEGATIVE  NEGATIVE mg/dL   Hgb urine dipstick MODERATE (*) NEGATIVE   Bilirubin Urine NEGATIVE  NEGATIVE   Ketones, ur NEGATIVE  NEGATIVE mg/dL   Protein, ur NEGATIVE  NEGATIVE mg/dL   Urobilinogen, UA 0.2  0.0 - 1.0 mg/dL   Nitrite NEGATIVE  NEGATIVE   Leukocytes, UA SMALL (*) NEGATIVE  PREGNANCY, URINE      Result Value Range   Preg Test, Ur NEGATIVE  NEGATIVE  URINE MICROSCOPIC-ADD ON      Result Value Range   Squamous Epithelial / LPF RARE  RARE   WBC, UA 3-6  <3 WBC/hpf   RBC / HPF 3-6  <3 RBC/hpf   Bacteria, UA RARE  RARE   Urine-Other  MUCOUS PRESENT      Imaging Review No results found.  EKG Interpretation   None       MDM  No diagnosis found. 1. Cyclic vomiting syndrome without dehydration  Symptoms managed with IM medications. She continues to be well-appearing, no further vomiting. Stable for discharge.    Dewaine Oats, PA-C 05/27/13 1304

## 2013-05-28 ENCOUNTER — Emergency Department (HOSPITAL_BASED_OUTPATIENT_CLINIC_OR_DEPARTMENT_OTHER)
Admission: EM | Admit: 2013-05-28 | Discharge: 2013-05-28 | Disposition: A | Discharge status: Home / Self Care | Payer: Medicaid Other | Attending: Emergency Medicine | Admitting: Emergency Medicine

## 2013-05-28 ENCOUNTER — Encounter (HOSPITAL_BASED_OUTPATIENT_CLINIC_OR_DEPARTMENT_OTHER): Payer: Self-pay | Admitting: Emergency Medicine

## 2013-05-28 DIAGNOSIS — Z9851 Tubal ligation status: Secondary | ICD-10-CM | POA: Insufficient documentation

## 2013-05-28 DIAGNOSIS — Z3202 Encounter for pregnancy test, result negative: Secondary | ICD-10-CM | POA: Insufficient documentation

## 2013-05-28 DIAGNOSIS — Z9089 Acquired absence of other organs: Secondary | ICD-10-CM | POA: Insufficient documentation

## 2013-05-28 DIAGNOSIS — Z79899 Other long term (current) drug therapy: Secondary | ICD-10-CM | POA: Insufficient documentation

## 2013-05-28 DIAGNOSIS — R109 Unspecified abdominal pain: Secondary | ICD-10-CM

## 2013-05-28 DIAGNOSIS — R1084 Generalized abdominal pain: Secondary | ICD-10-CM | POA: Insufficient documentation

## 2013-05-28 DIAGNOSIS — R112 Nausea with vomiting, unspecified: Secondary | ICD-10-CM

## 2013-05-28 DIAGNOSIS — I10 Essential (primary) hypertension: Secondary | ICD-10-CM | POA: Insufficient documentation

## 2013-05-28 LAB — URINALYSIS, ROUTINE W REFLEX MICROSCOPIC
Bilirubin Urine: NEGATIVE
Ketones, ur: 15 mg/dL — AB
Nitrite: NEGATIVE
Protein, ur: NEGATIVE mg/dL
Specific Gravity, Urine: 1.013 (ref 1.005–1.030)
Urobilinogen, UA: 0.2 mg/dL (ref 0.0–1.0)

## 2013-05-28 LAB — URINE MICROSCOPIC-ADD ON

## 2013-05-28 LAB — PREGNANCY, URINE: Preg Test, Ur: NEGATIVE

## 2013-05-28 MED ORDER — HYDROMORPHONE HCL PF 1 MG/ML IJ SOLN
1.0000 mg | Freq: Once | INTRAMUSCULAR | Status: AC
  Administered 2013-05-28: 1 mg via INTRAVENOUS
  Filled 2013-05-28: qty 1

## 2013-05-28 MED ORDER — PROMETHAZINE HCL 25 MG/ML IJ SOLN
25.0000 mg | Freq: Once | INTRAMUSCULAR | Status: DC
  Filled 2013-05-28: qty 1

## 2013-05-28 MED ORDER — PROMETHAZINE HCL 25 MG/ML IJ SOLN
25.0000 mg | Freq: Once | INTRAMUSCULAR | Status: AC
  Administered 2013-05-28 (×2): 25 mg via INTRAVENOUS
  Filled 2013-05-28: qty 1

## 2013-05-28 MED ORDER — SODIUM CHLORIDE 0.9 % IV BOLUS (SEPSIS)
1000.0000 mL | Freq: Once | INTRAVENOUS | Status: AC
  Administered 2013-05-28: 1000 mL via INTRAVENOUS

## 2013-05-28 MED ORDER — PROMETHAZINE HCL 25 MG/ML IJ SOLN
INTRAMUSCULAR | Status: AC
  Administered 2013-05-28: 25 mg via INTRAVENOUS
  Filled 2013-05-28: qty 1

## 2013-05-28 NOTE — ED Notes (Signed)
Pt unable to void

## 2013-05-28 NOTE — ED Notes (Signed)
Pt is unable to void.

## 2013-05-28 NOTE — ED Notes (Signed)
Pt is requesting pain medication.

## 2013-05-28 NOTE — ED Notes (Signed)
Attempted IV access x 2 unsuccessful. Richardson Landry, RRT at bedside attempting IV.

## 2013-05-28 NOTE — ED Notes (Signed)
Pt is requesting nausea medication.

## 2013-05-28 NOTE — ED Notes (Signed)
Pt brought via [facility] for n/v with h/o cyclical vomiting. Pt is out of dilaudid po and sts she has not been taking phenergan.

## 2013-05-28 NOTE — ED Provider Notes (Signed)
CSN: [id number]     Arrival date & time 05/28/13  0809 History   First MD Initiated Contact with Patient 05/28/13 0818     Chief Complaint  Patient presents with  . Emesis   HPI  This patient p/w concern of ongoing abd pain and n/v.  Sx began ~3d pta w no clear precipitant.  Since onset Sx have been progressive, w inability to tolerate PO meds (including Dilaudid).  No new f/c, confusion, disorientation. Abd pain is diffuse, sore, not improved with anything.  No clear exacerbating factors beyond emesis. Patient describes this as a "typical" exacerbation of her cyclic vomiting syndrome.    Past Medical History  Diagnosis Date  . Hypertension   . Cyclical vomiting    Past Surgical History  Procedure Laterality Date  . Tonsillectomy    . Tubal ligation    . Cesarean section    . Cholecystectomy    . Cesarean section    . Tubal ligation     No family history on file. History  Substance Use Topics  . Smoking status: Never Smoker   . Smokeless tobacco: Never Used  . Alcohol Use: No   OB History   Grav Para Term Preterm Abortions TAB SAB Ect Mult Living                 Review of Systems  Constitutional:       Per HPI, otherwise negative  HENT:       Per HPI, otherwise negative  Respiratory:       Per HPI, otherwise negative  Cardiovascular:       Per HPI, otherwise negative  Gastrointestinal: Positive for nausea, vomiting and abdominal pain. Negative for diarrhea.  Endocrine:       Negative aside from HPI  Genitourinary:       Neg aside from HPI   Musculoskeletal:       Low back pain, posterior upper leg pain. This is typical for a bad vomiting crisis  Skin: Negative.   Neurological: Negative for syncope.    Allergies  Other; Peanut-containing drug products; Percocet; Tomato; and Zofran  Home Medications   Current Outpatient Rx  Name  Route  Sig  Dispense  Refill  . flintstones complete (FLINTSTONES) 60 MG chewable tablet   Oral   Chew 1 tablet by mouth  daily.           Marland Kitchen HYDROmorphone HCl (DILAUDID PO)   Oral   Take 100 mg by mouth 2 (two) times daily.         . promethazine (PHENERGAN) 25 MG suppository   Rectal   Place 1 suppository (25 mg total) rectally every 4 (four) hours as needed for nausea.   12 each   0   . promethazine (PHENERGAN) 25 MG tablet   Oral   Take 1 tablet (25 mg total) by mouth every 6 (six) hours as needed for nausea.   30 tablet   0    BP 165/98  Pulse 98  Temp(Src) 98.5 F (36.9 C) (Oral)  Resp 18  Ht 5' 3"  (1.6 m)  Wt 138 lb (62.596 kg)  BMI 24.45 kg/m2  SpO2 100%  LMP 04/25/2013 Physical Exam  Constitutional: She is oriented to person, place, and time. She appears well-developed and well-nourished. No distress.  Patient's Sx stop while talking, and when the examiner turns around the patient starts retching again. Appears in no distress. Conversant  HENT:  Head: Normocephalic and atraumatic.  Eyes:  Conjunctivae are normal. Pupils are equal, round, and reactive to light. No scleral icterus.  Neck: No tracheal deviation present.  Cardiovascular: Normal rate and regular rhythm.  Exam reveals no gallop and no friction rub.   No murmur heard. Pulmonary/Chest: Effort normal and breath sounds normal. No stridor. No respiratory distress. She has no wheezes. She has no rales.  Abdominal: Soft. Bowel sounds are normal. She exhibits no distension. There is no tenderness. There is no rebound and no guarding.  A soft abdomen diffusely. Normal active bowel sounds. Non-peritoneal. Umbilical piercing.  Musculoskeletal: Normal range of motion.  Neurological: She is alert and oriented to person, place, and time.  Skin: Skin is warm and dry. No rash noted.  Psychiatric: She has a normal mood and affect. Her behavior is normal.    ED Course  Procedures (including critical care time) Labs Review Labs Reviewed  PREGNANCY, URINE  URINALYSIS, ROUTINE W REFLEX MICROSCOPIC   Imaging Review No results  found.  EKG Interpretation   None      Prior to, and following the initial eval I reviewed the patient's chart.  She has 14 ED visits here within 93m and documented visits to other nearby facilities.  She has had multiple blood draws, and CT within that time, with largely reassuring findings.  No initial labs ordered.   O2- 99%ra, nml  10:16 AM Are exam the patient appears better.  Vital signs remained stable.  She has been ambulatory.    labs, ordered from triage, were unremarkable.   MDM   No diagnosis found.  this patient with history of cyclic vomiting syndrome presents with vomiting, abdominal pain.  On exam she is awake and alert, afebrile, with a soft, non-peritoneal abdomen.  With her description of by mouth intolerance, she received IV fluids of her medication, and after she improved substantially, she was discharged in stable condition to follow up with her treatment team    RCarmin Muskrat MD 05/28/13 1018

## 2013-05-28 NOTE — ED Notes (Signed)
Pt reports pain, vomiting and inability to consume PO fluids since Friday.  States she is out of Dilaudid and PO and PR Phenergan isn't effective.  She was seen in ED yesterday for the same.  Last dose of PO phenergan was yesterday at 11am and PR Phenergan 2300.  She has an appointment with PMD Friday.

## 2013-05-28 NOTE — ED Notes (Signed)
MD at bedside. 

## 2013-05-30 ENCOUNTER — Emergency Department (HOSPITAL_BASED_OUTPATIENT_CLINIC_OR_DEPARTMENT_OTHER)
Admission: EM | Admit: 2013-05-30 | Discharge: 2013-05-30 | Disposition: A | Discharge status: Home / Self Care | Payer: Medicaid Other | Attending: Emergency Medicine | Admitting: Emergency Medicine

## 2013-05-30 ENCOUNTER — Encounter (HOSPITAL_BASED_OUTPATIENT_CLINIC_OR_DEPARTMENT_OTHER): Payer: Self-pay | Admitting: Emergency Medicine

## 2013-05-30 DIAGNOSIS — Z79899 Other long term (current) drug therapy: Secondary | ICD-10-CM | POA: Insufficient documentation

## 2013-05-30 DIAGNOSIS — R1115 Cyclical vomiting syndrome unrelated to migraine: Secondary | ICD-10-CM

## 2013-05-30 DIAGNOSIS — F1120 Opioid dependence, uncomplicated: Secondary | ICD-10-CM

## 2013-05-30 DIAGNOSIS — F1920 Other psychoactive substance dependence, uncomplicated: Secondary | ICD-10-CM | POA: Insufficient documentation

## 2013-05-30 DIAGNOSIS — R1084 Generalized abdominal pain: Secondary | ICD-10-CM | POA: Insufficient documentation

## 2013-05-30 DIAGNOSIS — I10 Essential (primary) hypertension: Secondary | ICD-10-CM | POA: Insufficient documentation

## 2013-05-30 DIAGNOSIS — G8929 Other chronic pain: Secondary | ICD-10-CM | POA: Insufficient documentation

## 2013-05-30 MED ORDER — HYDROMORPHONE HCL PF 1 MG/ML IJ SOLN
1.0000 mg | Freq: Once | INTRAMUSCULAR | Status: AC
  Administered 2013-05-30: 1 mg via INTRAMUSCULAR
  Filled 2013-05-30: qty 1

## 2013-05-30 MED ORDER — PROMETHAZINE HCL 25 MG/ML IJ SOLN
25.0000 mg | Freq: Once | INTRAMUSCULAR | Status: AC
  Administered 2013-05-30: 25 mg via INTRAMUSCULAR
  Filled 2013-05-30: qty 1

## 2013-05-30 NOTE — ED Provider Notes (Signed)
TIME SEEN: 8:39 AM  CHIEF COMPLAINT: Cyclic vomiting  HPI: Patient is a 27 year old female with a history of hypertension, cyclic vomiting likely due to narcotic abuse versus marijuana use who presents the emergency department with several episodes of nonbloody, nonbilious vomiting. She states that the symptoms are similar to her cyclical vomiting. She is also complaining of diffuse abdominal discomfort. No fevers. She has had 4 episodes of nonbloody diarrhea. Denies any sick contacts. No recent travel or antibiotic use. She has not been recently hospitalized. Denies any dysuria or hematuria. She is currently on her menstrual cycle. No vaginal discharge.  Of note, patient has been seen in the emergency Department 21 times since January 2014 for the same symptoms.  She states that she has not had meds [place], she is at [facility]. She states she believes she goes to the emergency Department every day.  ROS: See HPI Constitutional: no fever  Eyes: no drainage  ENT: no runny nose   Cardiovascular:  no chest pain  Resp: no SOB  GI: vomiting GU: no dysuria Integumentary: no rash  Allergy: no hives  Musculoskeletal: no leg swelling  Neurological: no slurred speech ROS otherwise negative  PAST MEDICAL HISTORY/PAST SURGICAL HISTORY:  Past Medical History  Diagnosis Date  . Hypertension   . Cyclical vomiting     MEDICATIONS:  Prior to Admission medications   Medication Sig Start Date End Date Taking? Authorizing Provider  flintstones complete (FLINTSTONES) 60 MG chewable tablet Chew 1 tablet by mouth daily.      Historical Provider, MD  HYDROmorphone HCl (DILAUDID PO) Take 100 mg by mouth 2 (two) times daily.    Historical Provider, MD  promethazine (PHENERGAN) 25 MG suppository Place 1 suppository (25 mg total) rectally every 4 (four) hours as needed for nausea. 05/22/13 05/29/13  Timmothy Euler, MD  promethazine (PHENERGAN) 25 MG tablet Take 1 tablet (25 mg total)  by mouth every 6 (six) hours as needed for nausea. 05/22/13   Timmothy Euler, MD    ALLERGIES:  Allergies  Allergen Reactions  . Other Itching    Fuzzy fruit  . Peanut-Containing Drug Products Hives and Itching  . Percocet [Oxycodone-Acetaminophen] Hives and Itching  . Tomato Hives and Itching  . Zofran Hives and Itching    SOCIAL HISTORY:  History  Substance Use Topics  . Smoking status: Never Smoker   . Smokeless tobacco: Never Used  . Alcohol Use: No    FAMILY HISTORY: No family history on file.  EXAM: BP 166/110  Pulse 110  Temp(Src) 98.9 F (37.2 C) (Oral)  Resp 20  Ht 5' 3"  (1.6 m)  Wt 138 lb (62.596 kg)  BMI 24.45 kg/m2  SpO2 100%  LMP 05/26/2013 CONSTITUTIONAL: Alert and oriented and responds appropriately to questions. Well-appearing; well-nourished; no apparent distress HEAD: Normocephalic EYES: Conjunctivae clear, PERRL ENT: normal nose; no rhinorrhea; moist mucous membranes; pharynx without lesions noted NECK: Supple, no meningismus, no LAD  CARD: Tachycardic; S1 and S2 appreciated; no murmurs, no clicks, no rubs, no gallops RESP: Normal chest excursion without splinting or tachypnea; breath sounds clear and equal bilaterally; no wheezes, no rhonchi, no rales,  ABD/GI: Normal bowel sounds; non-distended; soft, non-tender, no rebound, no guarding; no peritoneal signs RECTAL:  Normal tone, patient has small external nonthrombosed hemorrhoids, no gross blood or melena, patient is currently menstruating which makes heme occult falsely positive BACK:  The back appears normal and is non-tender to palpation, there is no CVA tenderness  EXT: Normal ROM in all joints; non-tender to palpation; no edema; normal capillary refill; no cyanosis    SKIN: Normal color for age and race; warm NEURO: Moves all extremities equally PSYCH: The patient's mood and manner are appropriate. Grooming and personal hygiene are appropriate.  MEDICAL DECISION MAKING: Patient here with  her typical symptoms of cyclical vomiting, chronic abdominal pain. I feel this is likely due to narcotic withdrawal, narcotic bowel. I had a lengthy discussion with the patient that I do not feel that the emergency department appropriate place to treat her chronic symptoms and have recommended followup with primary care physician. We have given her [place] and wellness information. Have also recommended pain management clinic versus rehabilitation. Her abdominal exam is completely benign. She is mildly tachycardic and hypertensive but again I suspect this is do to withdrawal rather than dehydration. I am not concerned for any life-threatening illness. Patient has had recent labs that have been normal other than a mildly elevated lipase. She is also had negative urine pregnancy test in the past week and urinalysis. Her urine did show gross blood but again this is likely due to her current menstruation. I do not feel labs, urine need to be repeated today. I also do not feel patient is dehydrated and needs IV fluids. Given benign exam, imaging is not indicated today.  Will give one dose of IM narcotics and antiemetics and then po challenge.  ED PROGRESS: Patient reports her pain is improved she still having mild nausea. No further vomiting in the emergency department. Will po challenge.   9:55 AM  Pt reports her pain is improved. She states she now agrees that narcotics are not helping her situation and does not want anymore in the ED. She's been able to tolerate a glass of water and again no further vomiting in the ED. She is requesting one more dose of IM Phenergan prior to discharge. She states she has plenty of medications at home. Given return precautions and outpatient followup information. Patient verbalizes understanding and is comfortable with plan.  [place], DO 05/30/13 [phone]

## 2013-05-30 NOTE — ED Notes (Signed)
EMS reports patient has complaints of abdominal pain and chronic nausea and vomiting.  Symptoms are associated with diarrhea.

## 2014-01-06 ENCOUNTER — Emergency Department (HOSPITAL_BASED_OUTPATIENT_CLINIC_OR_DEPARTMENT_OTHER)
Admission: EM | Admit: 2014-01-06 | Discharge: 2014-01-06 | Disposition: A | Discharge status: Home / Self Care | Payer: Medicaid Other | Attending: Emergency Medicine | Admitting: Emergency Medicine

## 2014-01-06 ENCOUNTER — Encounter (HOSPITAL_BASED_OUTPATIENT_CLINIC_OR_DEPARTMENT_OTHER): Payer: Self-pay | Admitting: Emergency Medicine

## 2014-01-06 DIAGNOSIS — R1115 Cyclical vomiting syndrome unrelated to migraine: Secondary | ICD-10-CM | POA: Insufficient documentation

## 2014-01-06 DIAGNOSIS — Z8719 Personal history of other diseases of the digestive system: Secondary | ICD-10-CM | POA: Insufficient documentation

## 2014-01-06 DIAGNOSIS — I10 Essential (primary) hypertension: Secondary | ICD-10-CM | POA: Insufficient documentation

## 2014-01-06 DIAGNOSIS — R110 Nausea: Secondary | ICD-10-CM | POA: Insufficient documentation

## 2014-01-06 DIAGNOSIS — Z79899 Other long term (current) drug therapy: Secondary | ICD-10-CM | POA: Insufficient documentation

## 2014-01-06 DIAGNOSIS — M549 Dorsalgia, unspecified: Secondary | ICD-10-CM | POA: Insufficient documentation

## 2014-01-06 DIAGNOSIS — R109 Unspecified abdominal pain: Secondary | ICD-10-CM | POA: Insufficient documentation

## 2014-01-06 LAB — CBC WITH DIFFERENTIAL/PLATELET
Basophils Absolute: 0 10*3/uL (ref 0.0–0.1)
Basophils Relative: 0 % (ref 0–1)
EOS ABS: 0.1 10*3/uL (ref 0.0–0.7)
EOS PCT: 2 % (ref 0–5)
HCT: 41.2 % (ref 36.0–46.0)
Hemoglobin: 13.7 g/dL (ref 12.0–15.0)
Lymphocytes Relative: 19 % (ref 12–46)
Lymphs Abs: 1.6 10*3/uL (ref 0.7–4.0)
MCH: 30.4 pg (ref 26.0–34.0)
MCHC: 33.3 g/dL (ref 30.0–36.0)
MCV: 91.6 fL (ref 78.0–100.0)
Monocytes Absolute: 0.8 10*3/uL (ref 0.1–1.0)
Monocytes Relative: 9 % (ref 3–12)
Neutro Abs: 5.7 10*3/uL (ref 1.7–7.7)
Neutrophils Relative %: 69 % (ref 43–77)
PLATELETS: 308 10*3/uL (ref 150–400)
RBC: 4.5 MIL/uL (ref 3.87–5.11)
RDW: 13.6 % (ref 11.5–15.5)
WBC: 8.2 10*3/uL (ref 4.0–10.5)

## 2014-01-06 LAB — COMPREHENSIVE METABOLIC PANEL
ALT: 15 U/L (ref 0–35)
AST: 20 U/L (ref 0–37)
Albumin: 4.6 g/dL (ref 3.5–5.2)
Alkaline Phosphatase: 80 U/L (ref 39–117)
BUN: 20 mg/dL (ref 6–23)
CALCIUM: 10.2 mg/dL (ref 8.4–10.5)
CO2: 23 mEq/L (ref 19–32)
Chloride: 101 mEq/L (ref 96–112)
Creatinine, Ser: 0.8 mg/dL (ref 0.50–1.10)
GFR calc non Af Amer: >90 mL/min (ref >90)
GLUCOSE: 102 mg/dL — AB (ref 70–99)
Potassium: 4.2 mEq/L (ref 3.7–5.3)
SODIUM: 139 meq/L (ref 137–147)
TOTAL PROTEIN: 8.6 g/dL — AB (ref 6.0–8.3)
Total Bilirubin: 0.5 mg/dL (ref 0.3–1.2)

## 2014-01-06 LAB — LIPASE, BLOOD: Lipase: 22 U/L (ref 11–59)

## 2014-01-06 MED ORDER — HYDROMORPHONE HCL PF 1 MG/ML IJ SOLN
1.0000 mg | Freq: Once | INTRAMUSCULAR | Status: AC
  Administered 2014-01-06: 1 mg via INTRAVENOUS
  Filled 2014-01-06: qty 1

## 2014-01-06 MED ORDER — SODIUM CHLORIDE 0.9 % IV SOLN: INTRAVENOUS | Status: DC

## 2014-01-06 MED ORDER — SODIUM CHLORIDE 0.9 % IV BOLUS (SEPSIS)
1000.0000 mL | Freq: Once | INTRAVENOUS | Status: AC
  Administered 2014-01-06: 1000 mL via INTRAVENOUS

## 2014-01-06 MED ORDER — DIPHENHYDRAMINE HCL 50 MG/ML IJ SOLN
25.0000 mg | Freq: Once | INTRAMUSCULAR | Status: AC
  Administered 2014-01-06: 25 mg via INTRAVENOUS

## 2014-01-06 MED ORDER — DIPHENHYDRAMINE HCL 50 MG/ML IJ SOLN
INTRAMUSCULAR | Status: DC
Start: 2014-01-06 — End: 2014-01-06
  Filled 2014-01-06: qty 1

## 2014-01-06 MED ORDER — PROMETHAZINE HCL 25 MG/ML IJ SOLN
12.5000 mg | Freq: Once | INTRAMUSCULAR | Status: AC
  Administered 2014-01-06: 12.5 mg via INTRAVENOUS
  Filled 2014-01-06: qty 1

## 2014-01-06 NOTE — ED Provider Notes (Signed)
CSN: [id number]     Arrival date & time 01/06/14  1128 History   First MD Initiated Contact with Patient 01/06/14 1142     Chief Complaint  Patient presents with  . Abdominal Pain     (Consider location/radiation/quality/duration/timing/severity/associated sxs/prior Treatment) Patient is a 28 y.o. female presenting with abdominal pain.  Abdominal Pain Associated symptoms: vomiting   Associated symptoms: no chest pain, no dysuria, no fever and no shortness of breath    patient with known history of cervical vomiting syndrome. Has been vomiting since Saturday it 24+ times a day. Associated with some back pain and some abdominal pain but no severe abdominal pain. Patient denies vomiting any blood. No diarrhea.  Past Medical History  Diagnosis Date  . Hypertension   . Cyclical vomiting    Past Surgical History  Procedure Laterality Date  . Tonsillectomy    . Tubal ligation    . Cesarean section    . Cholecystectomy    . Cesarean section    . Tubal ligation     No family history on file. History  Substance Use Topics  . Smoking status: Never Smoker   . Smokeless tobacco: Never Used  . Alcohol Use: No   OB History   Grav Para Term Preterm Abortions TAB SAB Ect Mult Living                 Review of Systems  Constitutional: Negative for fever.  HENT: Negative for congestion.   Eyes: Negative for visual disturbance.  Respiratory: Negative for shortness of breath.   Cardiovascular: Negative for chest pain.  Gastrointestinal: Positive for vomiting and abdominal pain.  Genitourinary: Negative for dysuria.  Musculoskeletal: Positive for back pain.  Skin: Negative for rash.  Neurological: Negative for headaches.  Hematological: Does not bruise/bleed easily.  Psychiatric/Behavioral: Negative for confusion.      Allergies  Other; Peanut-containing drug products; Percocet; Tomato; and Zofran  Home Medications   Prior to Admission medications   Medication Sig Start Date  End Date Taking? Authorizing Provider  flintstones complete (FLINTSTONES) 60 MG chewable tablet Chew 1 tablet by mouth daily.      Historical Provider, MD  HYDROmorphone HCl (DILAUDID PO) Take 100 mg by mouth 2 (two) times daily.    Historical Provider, MD  promethazine (PHENERGAN) 25 MG suppository Place 1 suppository (25 mg total) rectally every 4 (four) hours as needed for nausea. 05/22/13 05/29/13  Timmothy Euler, MD  promethazine (PHENERGAN) 25 MG tablet Take 1 tablet (25 mg total) by mouth every 6 (six) hours as needed for nausea. 05/22/13   Timmothy Euler, MD   BP 146/110  Pulse 75  Temp(Src) 98.3 F (36.8 C) (Oral)  Resp 18  Ht 5' 3"  (1.6 m)  Wt 156 lb (70.761 kg)  BMI 27.64 kg/m2  SpO2 100%  LMP 01/01/2014 Physical Exam  Nursing note and vitals reviewed. Constitutional: She is oriented to person, place, and time. She appears well-developed and well-nourished. No distress.  HENT:  Head: Normocephalic and atraumatic.  Mouth/Throat: Oropharynx is clear and moist.  Eyes: Conjunctivae are normal. Pupils are equal, round, and reactive to light.  Neck: Normal range of motion.  Cardiovascular: Normal rate, regular rhythm and normal heart sounds.   Pulmonary/Chest: Effort normal and breath sounds normal. No respiratory distress.  Abdominal: Soft. Bowel sounds are normal. There is no tenderness.  Musculoskeletal: Normal range of motion.  Neurological: She is alert and oriented to person, place, and time. No cranial nerve  deficit. Coordination normal.  Skin: Skin is warm. No rash noted.    ED Course  Procedures (including critical care time) Labs Review Labs Reviewed  COMPREHENSIVE METABOLIC PANEL - Abnormal; Notable for the following:    Glucose, Bld 102 (*)    Total Protein 8.6 (*)    All other components within normal limits  CBC WITH DIFFERENTIAL  LIPASE, BLOOD   Results for orders placed during the hospital encounter of 01/06/14  CBC WITH DIFFERENTIAL      Result  Value Ref Range   WBC 8.2  4.0 - 10.5 K/uL   RBC 4.50  3.87 - 5.11 MIL/uL   Hemoglobin 13.7  12.0 - 15.0 g/dL   HCT 41.2  36.0 - 46.0 %   MCV 91.6  78.0 - 100.0 fL   MCH 30.4  26.0 - 34.0 pg   MCHC 33.3  30.0 - 36.0 g/dL   RDW 13.6  11.5 - 15.5 %   Platelets 308  150 - 400 K/uL   Neutrophils Relative % 69  43 - 77 %   Neutro Abs 5.7  1.7 - 7.7 K/uL   Lymphocytes Relative 19  12 - 46 %   Lymphs Abs 1.6  0.7 - 4.0 K/uL   Monocytes Relative 9  3 - 12 %   Monocytes Absolute 0.8  0.1 - 1.0 K/uL   Eosinophils Relative 2  0 - 5 %   Eosinophils Absolute 0.1  0.0 - 0.7 K/uL   Basophils Relative 0  0 - 1 %   Basophils Absolute 0.0  0.0 - 0.1 K/uL  COMPREHENSIVE METABOLIC PANEL      Result Value Ref Range   Sodium 139  137 - 147 mEq/L   Potassium 4.2  3.7 - 5.3 mEq/L   Chloride 101  96 - 112 mEq/L   CO2 23  19 - 32 mEq/L   Glucose, Bld 102 (*) 70 - 99 mg/dL   BUN 20  6 - 23 mg/dL   Creatinine, Ser 0.80  0.50 - 1.10 mg/dL   Calcium 10.2  8.4 - 10.5 mg/dL   Total Protein 8.6 (*) 6.0 - 8.3 g/dL   Albumin 4.6  3.5 - 5.2 g/dL   AST 20  0 - 37 U/L   ALT 15  0 - 35 U/L   Alkaline Phosphatase 80  39 - 117 U/L   Total Bilirubin 0.5  0.3 - 1.2 mg/dL   GFR calc non Af Amer >90  >90 mL/min   GFR calc Af Amer >90  >90 mL/min  LIPASE, BLOOD      Result Value Ref Range   Lipase 22  11 - 59 U/L     Imaging Review No results found.   EKG Interpretation None      MDM   Final diagnoses:  Non-intractable cyclical vomiting with nausea    The patient percent with recurrent persistent vomiting and abdominal discomfort. No severe abdominal pain. Patient has a history of cyclical vomiting syndrome. Has done very well for several months but now has gotten worse since Saturday.  Patient improved here significantly with hydromorphone and Phenergan.  No significant electrolyte abnormalities.    Fredia Sorrow, MD 01/06/14 1321

## 2014-01-06 NOTE — ED Notes (Signed)
Pt cont to c/o pain nausea an itching

## 2014-01-06 NOTE — ED Notes (Signed)
Called to pt room.  C/O generalized itching, no hives or difficulty breathing.  Asking for Benadryl.  EDP informed.

## 2014-01-06 NOTE — ED Notes (Signed)
Abdominal pain and vomiting since Saturday. Can't keep PO phenergan down.

## 2014-01-06 NOTE — Discharge Instructions (Signed)
Continue Phenergan at home. Continue Benadryl every 6 hours as needed for the itching. Return for any newer worse symptoms.

## 2014-01-22 ENCOUNTER — Emergency Department (HOSPITAL_BASED_OUTPATIENT_CLINIC_OR_DEPARTMENT_OTHER)
Admission: EM | Admit: 2014-01-22 | Discharge: 2014-01-22 | Disposition: A | Discharge status: Home / Self Care | Payer: Medicaid Other | Attending: Emergency Medicine | Admitting: Emergency Medicine

## 2014-01-22 ENCOUNTER — Encounter (HOSPITAL_BASED_OUTPATIENT_CLINIC_OR_DEPARTMENT_OTHER): Payer: Self-pay | Admitting: Emergency Medicine

## 2014-01-22 DIAGNOSIS — N390 Urinary tract infection, site not specified: Secondary | ICD-10-CM | POA: Diagnosis not present

## 2014-01-22 DIAGNOSIS — A5901 Trichomonal vulvovaginitis: Secondary | ICD-10-CM | POA: Diagnosis not present

## 2014-01-22 DIAGNOSIS — I10 Essential (primary) hypertension: Secondary | ICD-10-CM | POA: Diagnosis not present

## 2014-01-22 DIAGNOSIS — Z3202 Encounter for pregnancy test, result negative: Secondary | ICD-10-CM | POA: Insufficient documentation

## 2014-01-22 DIAGNOSIS — Z79899 Other long term (current) drug therapy: Secondary | ICD-10-CM | POA: Insufficient documentation

## 2014-01-22 DIAGNOSIS — R1110 Vomiting, unspecified: Secondary | ICD-10-CM | POA: Diagnosis present

## 2014-01-22 LAB — URINALYSIS, ROUTINE W REFLEX MICROSCOPIC
Bilirubin Urine: NEGATIVE
Glucose, UA: NEGATIVE mg/dL
Ketones, ur: NEGATIVE mg/dL
NITRITE: NEGATIVE
Protein, ur: NEGATIVE mg/dL
Specific Gravity, Urine: 1.014 (ref 1.005–1.030)
UROBILINOGEN UA: 0.2 mg/dL (ref 0.0–1.0)
pH: 6.5 (ref 5.0–8.0)

## 2014-01-22 LAB — URINE MICROSCOPIC-ADD ON

## 2014-01-22 LAB — PREGNANCY, URINE: PREG TEST UR: NEGATIVE

## 2014-01-22 LAB — WET PREP, GENITAL
Trich, Wet Prep: NONE SEEN
Yeast Wet Prep HPF POC: NONE SEEN

## 2014-01-22 MED ORDER — DIPHENHYDRAMINE HCL 50 MG/ML IJ SOLN
25.0000 mg | Freq: Once | INTRAMUSCULAR | Status: AC
  Administered 2014-01-22: 25 mg via INTRAVENOUS
  Filled 2014-01-22: qty 1

## 2014-01-22 MED ORDER — CEPHALEXIN 500 MG PO CAPS: 500.0000 mg | ORAL_CAPSULE | Freq: Four times a day (QID) | ORAL | Status: DC

## 2014-01-22 MED ORDER — PROMETHAZINE HCL 25 MG PO TABS: 25.0000 mg | ORAL_TABLET | Freq: Four times a day (QID) | ORAL | Status: DC | PRN

## 2014-01-22 MED ORDER — SODIUM CHLORIDE 0.9 % IV SOLN
Freq: Once | INTRAVENOUS | Status: AC
  Administered 2014-01-22: 16:00:00 via INTRAVENOUS

## 2014-01-22 MED ORDER — METOCLOPRAMIDE HCL 5 MG/ML IJ SOLN
10.0000 mg | Freq: Once | INTRAMUSCULAR | Status: AC
  Administered 2014-01-22: 10 mg via INTRAVENOUS
  Filled 2014-01-22: qty 2

## 2014-01-22 MED ORDER — METRONIDAZOLE 500 MG PO TABS: 500.0000 mg | ORAL_TABLET | Freq: Two times a day (BID) | ORAL | Status: DC

## 2014-01-22 NOTE — Discharge Instructions (Signed)
Urinary Tract Infection Urinary tract infections (UTIs) can develop anywhere along your urinary tract. Your urinary tract is your body's drainage system for removing wastes and extra water. Your urinary tract includes two kidneys, two ureters, a bladder, and a urethra. Your kidneys are a pair of bean-shaped organs. Each kidney is about the size of your fist. They are located below your ribs, one on each side of your spine. CAUSES Infections are caused by microbes, which are microscopic organisms, including fungi, viruses, and bacteria. These organisms are so small that they can only be seen through a microscope. Bacteria are the microbes that most commonly cause UTIs. SYMPTOMS  Symptoms of UTIs may vary by age and gender of the patient and by the location of the infection. Symptoms in young women typically include a frequent and intense urge to urinate and a painful, burning feeling in the bladder or urethra during urination. Older women and men are more likely to be tired, shaky, and weak and have muscle aches and abdominal pain. A fever may mean the infection is in your kidneys. Other symptoms of a kidney infection include pain in your back or sides below the ribs, nausea, and vomiting. DIAGNOSIS To diagnose a UTI, your caregiver will ask you about your symptoms. Your caregiver also will ask to provide a urine sample. The urine sample will be tested for bacteria and white blood cells. White blood cells are made by your body to help fight infection. TREATMENT  Typically, UTIs can be treated with medication. Because most UTIs are caused by a bacterial infection, they usually can be treated with the use of antibiotics. The choice of antibiotic and length of treatment depend on your symptoms and the type of bacteria causing your infection. HOME CARE INSTRUCTIONS  If you were prescribed antibiotics, take them exactly as your caregiver instructs you. Finish the medication even if you feel better after you  have only taken some of the medication.  Drink enough water and fluids to keep your urine clear or pale yellow.  Avoid caffeine, tea, and carbonated beverages. They tend to irritate your bladder.  Empty your bladder often. Avoid holding urine for long periods of time.  Empty your bladder before and after sexual intercourse.  After a bowel movement, women should cleanse from front to back. Use each tissue only once. SEEK MEDICAL CARE IF:   You have back pain.  You develop a fever.  Your symptoms do not begin to resolve within 3 days. SEEK IMMEDIATE MEDICAL CARE IF:   You have severe back pain or lower abdominal pain.  You develop chills.  You have nausea or vomiting.  You have continued burning or discomfort with urination. MAKE SURE YOU:   Understand these instructions.  Will watch your condition.  Will get help right away if you are not doing well or get worse. Document Released: 04/05/2005 Document Revised: 12/26/2011 Document Reviewed: 08/04/2011 [facility] Patient Information 2015 [place], [place]. This information is not intended to replace advice given to you by your health care provider. Make sure you discuss any questions you have with your health care provider. Trichomoniasis Trichomoniasis is an infection caused by an organism called Trichomonas. The infection can affect both women and men. In women, the outer female genitalia and the vagina are affected. In men, the penis is mainly affected, but the prostate and other reproductive organs can also be involved. Trichomoniasis is a sexually transmitted infection (STI) and is most often passed to another person through sexual contact.  RISK FACTORS  Having unprotected sexual intercourse.  Having sexual intercourse with an infected partner. SIGNS AND SYMPTOMS  Symptoms of trichomoniasis in women include:  Abnormal gray-green frothy vaginal discharge.  Itching and irritation of the vagina.  Itching and irritation  of the area outside the vagina. Symptoms of trichomoniasis in men include:   Penile discharge with or without pain.  Pain during urination. This results from inflammation of the urethra. DIAGNOSIS  Trichomoniasis may be found during a Pap test or physical exam. Your health care provider may use one of the following methods to help diagnose this infection:  Examining vaginal discharge under a microscope. For men, urethral discharge would be examined.  Testing the pH of the vagina with a test tape.  Using a vaginal swab test that checks for the Trichomonas organism. A test is available that provides results within a few minutes.  Doing a culture test for the organism. This is not usually needed. TREATMENT   You may be given medicine to fight the infection. Women should inform their health care provider if they could be or are pregnant. Some medicines used to treat the infection should not be taken during pregnancy.  Your health care provider may recommend over-the-counter medicines or creams to decrease itching or irritation.  Your sexual partner will need to be treated if infected. HOME CARE INSTRUCTIONS   Take all medicine prescribed by your health care provider.  Take over-the-counter medicine for itching or irritation as directed by your health care provider.  Do not have sexual intercourse while you have the infection.  Women should not douche or wear tampons while they have the infection.  Discuss your infection with your partner. Your partner may have gotten the infection from you, or you may have gotten it from your partner.  Have your sex partner get examined and treated if necessary.  Practice safe, informed, and protected sex.  See your health care provider for other STI testing. SEEK MEDICAL CARE IF:   You still have symptoms after you finish your medicine.  You develop abdominal pain.  You have pain when you urinate.  You have bleeding after sexual  intercourse.  You develop a rash.  Your medicine makes you sick or makes you throw up (vomit). Document Released: 12/20/2000 Document Revised: 07/01/2013 Document Reviewed: 04/07/2013 [facility] Patient Information 2015 [place], [place]. This information is not intended to replace advice given to you by your health care provider. Make sure you discuss any questions you have with your health care provider.

## 2014-01-22 NOTE — ED Provider Notes (Signed)
CSN: [id number]     Arrival date & time 01/22/14  1323 History   First MD Initiated Contact with Patient 01/22/14 1344     Chief Complaint  Patient presents with  . Emesis     (Consider location/radiation/quality/duration/timing/severity/associated sxs/prior Treatment) Patient is a 28 y.o. female presenting with vomiting. The history is provided by the patient. No language interpreter was used.  Emesis Severity:  Moderate Duration:  2 days Timing:  Constant Number of daily episodes:  Multiple Progression:  Worsening Chronicity:  New Recent urination:  Normal Relieved by:  Nothing Worsened by:  Nothing tried Ineffective treatments:  None tried Risk factors: diabetes     Past Medical History  Diagnosis Date  . Hypertension   . Cyclical vomiting    Past Surgical History  Procedure Laterality Date  . Tonsillectomy    . Tubal ligation    . Cesarean section    . Cholecystectomy    . Cesarean section    . Tubal ligation     No family history on file. History  Substance Use Topics  . Smoking status: Never Smoker   . Smokeless tobacco: Never Used  . Alcohol Use: No   OB History   Grav Para Term Preterm Abortions TAB SAB Ect Mult Living                 Review of Systems  Gastrointestinal: Positive for vomiting.  Genitourinary: Positive for vaginal discharge. Negative for dysuria, urgency and decreased urine volume.  All other systems reviewed and are negative.     Allergies  Other; Peanut-containing drug products; Percocet; Tomato; and Zofran  Home Medications   Prior to Admission medications   Medication Sig Start Date End Date Taking? Authorizing Provider  flintstones complete (FLINTSTONES) 60 MG chewable tablet Chew 1 tablet by mouth daily.      Historical Provider, MD  HYDROmorphone HCl (DILAUDID PO) Take 100 mg by mouth 2 (two) times daily.    Historical Provider, MD  promethazine (PHENERGAN) 25 MG suppository Place 1 suppository (25 mg total) rectally  every 4 (four) hours as needed for nausea. 05/22/13 05/29/13  Timmothy Euler, MD  promethazine (PHENERGAN) 25 MG tablet Take 1 tablet (25 mg total) by mouth every 6 (six) hours as needed for nausea. 05/22/13   Timmothy Euler, MD   BP 122/78  Pulse 96  Temp(Src) 98.7 F (37.1 C) (Oral)  Resp 14  Ht 5' 3"  (1.6 m)  Wt 160 lb (72.576 kg)  BMI 28.35 kg/m2  SpO2 100%  LMP 01/01/2014 Physical Exam  Nursing note and vitals reviewed. Constitutional: She is oriented to person, place, and time. She appears well-developed and well-nourished.  HENT:  Head: Normocephalic and atraumatic.  Eyes: Conjunctivae and EOM are normal. Pupils are equal, round, and reactive to light.  Neck: Normal range of motion. Neck supple.  Cardiovascular: Normal rate.   Pulmonary/Chest: Effort normal.  Abdominal: Soft. She exhibits no distension. There is no tenderness.  Genitourinary: Vaginal discharge found.  Vaginal discharge,  Thick white,  Adnexa no masses,  Cervix nontender  Musculoskeletal: Normal range of motion.  Neurological: She is alert and oriented to person, place, and time.  Skin: Skin is warm.  Psychiatric: She has a normal mood and affect.    ED Course  Procedures (including critical care time) Labs Review Labs Reviewed  URINALYSIS, ROUTINE W REFLEX MICROSCOPIC - Abnormal; Notable for the following:    APPearance CLOUDY (*)    Hgb urine dipstick SMALL (*)  Leukocytes, UA MODERATE (*)    All other components within normal limits  URINE MICROSCOPIC-ADD ON - Abnormal; Notable for the following:    Bacteria, UA MANY (*)    All other components within normal limits  PREGNANCY, URINE    Imaging Review No results found.   EKG Interpretation None      MDM   Final diagnoses:  UTI (lower urinary tract infection)  Trichomonas vaginitis   Flagyl Keflex Phenergan  See your Physician for recheck   Fransico Meadow, PA-C 01/22/14 1728

## 2014-01-22 NOTE — ED Provider Notes (Signed)
Medical screening examination/treatment/procedure(s) were performed by non-physician practitioner and as supervising physician I was immediately available for consultation/collaboration.   EKG Interpretation None        Ephraim Hamburger, MD 01/22/14 1732

## 2014-01-22 NOTE — ED Notes (Signed)
Nausea and vomiting. No relief with Phenergan and Benadryl at home.

## 2014-01-24 LAB — GC/CHLAMYDIA PROBE AMP
CT Probe RNA: NEGATIVE
GC PROBE AMP APTIMA: NEGATIVE

## 2014-08-05 DIAGNOSIS — G894 Chronic pain syndrome: Secondary | ICD-10-CM | POA: Insufficient documentation

## 2014-09-27 DIAGNOSIS — F1210 Cannabis abuse, uncomplicated: Secondary | ICD-10-CM | POA: Insufficient documentation

## 2014-10-13 ENCOUNTER — Other Ambulatory Visit: Payer: Self-pay | Admitting: Obstetrics

## 2015-01-18 ENCOUNTER — Emergency Department (HOSPITAL_BASED_OUTPATIENT_CLINIC_OR_DEPARTMENT_OTHER)
Admission: EM | Admit: 2015-01-18 | Discharge: 2015-01-18 | Disposition: A | Discharge status: Home / Self Care | Payer: Medicaid Other | Attending: Emergency Medicine | Admitting: Emergency Medicine

## 2015-01-18 ENCOUNTER — Encounter (HOSPITAL_BASED_OUTPATIENT_CLINIC_OR_DEPARTMENT_OTHER): Payer: Self-pay | Admitting: *Deleted

## 2015-01-18 DIAGNOSIS — I10 Essential (primary) hypertension: Secondary | ICD-10-CM | POA: Insufficient documentation

## 2015-01-18 DIAGNOSIS — R109 Unspecified abdominal pain: Secondary | ICD-10-CM | POA: Insufficient documentation

## 2015-01-18 DIAGNOSIS — Z79899 Other long term (current) drug therapy: Secondary | ICD-10-CM | POA: Insufficient documentation

## 2015-01-18 DIAGNOSIS — Z720 Tobacco use: Secondary | ICD-10-CM | POA: Insufficient documentation

## 2015-01-18 DIAGNOSIS — R112 Nausea with vomiting, unspecified: Secondary | ICD-10-CM | POA: Diagnosis present

## 2015-01-18 DIAGNOSIS — G43A0 Cyclical vomiting, not intractable: Secondary | ICD-10-CM | POA: Insufficient documentation

## 2015-01-18 DIAGNOSIS — Z3202 Encounter for pregnancy test, result negative: Secondary | ICD-10-CM | POA: Insufficient documentation

## 2015-01-18 DIAGNOSIS — R1115 Cyclical vomiting syndrome unrelated to migraine: Secondary | ICD-10-CM

## 2015-01-18 DIAGNOSIS — G8929 Other chronic pain: Secondary | ICD-10-CM | POA: Diagnosis not present

## 2015-01-18 LAB — URINALYSIS, ROUTINE W REFLEX MICROSCOPIC
BILIRUBIN URINE: NEGATIVE
GLUCOSE, UA: NEGATIVE mg/dL
Ketones, ur: NEGATIVE mg/dL
Leukocytes, UA: NEGATIVE
NITRITE: NEGATIVE
Protein, ur: NEGATIVE mg/dL
Specific Gravity, Urine: 1.016 (ref 1.005–1.030)
Urobilinogen, UA: 0.2 mg/dL (ref 0.0–1.0)
pH: 7 (ref 5.0–8.0)

## 2015-01-18 LAB — RAPID URINE DRUG SCREEN, HOSP PERFORMED
AMPHETAMINES: NOT DETECTED
BARBITURATES: NOT DETECTED
Benzodiazepines: NOT DETECTED
Cocaine: NOT DETECTED
Opiates: NOT DETECTED
TETRAHYDROCANNABINOL: POSITIVE — AB

## 2015-01-18 LAB — PREGNANCY, URINE: Preg Test, Ur: NEGATIVE

## 2015-01-18 LAB — URINE MICROSCOPIC-ADD ON

## 2015-01-18 MED ORDER — PROMETHAZINE HCL 25 MG/ML IJ SOLN
12.5000 mg | Freq: Once | INTRAMUSCULAR | Status: AC
  Administered 2015-01-18: 12.5 mg via INTRAMUSCULAR
  Filled 2015-01-18: qty 1

## 2015-01-18 MED ORDER — PROMETHAZINE HCL 25 MG PO TABS
12.5000 mg | ORAL_TABLET | Freq: Once | ORAL | Status: AC
  Administered 2015-01-18: 12.5 mg via ORAL
  Filled 2015-01-18: qty 1

## 2015-01-18 MED ORDER — DICYCLOMINE HCL 10 MG PO CAPS
10.0000 mg | ORAL_CAPSULE | Freq: Once | ORAL | Status: AC
  Administered 2015-01-18: 10 mg via ORAL
  Filled 2015-01-18: qty 1

## 2015-01-18 NOTE — ED Provider Notes (Signed)
CSN: [id number]     Arrival date & time 01/18/15  1211 History   First MD Initiated Contact with Patient 01/18/15 1226     Chief Complaint  Patient presents with  . Emesis     (Consider location/radiation/quality/duration/timing/severity/associated sxs/prior Treatment) HPI Comments: Pt. Is a 29 y/o AAF with history of Cyclical Vomiting Syndrome, and Chronic Abdominal pain, chronic Narcotic use, who has been seen at multiple Emergency Departments / had multiple admissions for these diagnosis. She presents today with increasing vomiting since 7/8. She says that she has had continued nausea / vomiting over the past three days. She denies recent use of THC. She says the last time she used marijuana was 2 weeks ago. She has had no fever, chills, diarrhea. She says that she has not had any sick contacts, or ingested any suspicious food. She has had multiple studies including CT scan as recently as two weeks ago, and a gastric emptying study earlier this year. All of her studies up to this point have been negative. She says that she came today due to being unable to get an appointment with her PCP. She has been trying oral phenergan but she says she can't keep it down. She has been able to tolerate some PO. She has otherwise been taking narcotics at home for her abdominal pain. She denies hematuria, dysuria, frequency. She has had her fallopian tubes tied, she is sexually active, she has not had any vaginal discharge, itching, burning, or pain. She has not had any back pain. She Has had some stool recently, though she says this is less frequent than normal. She does not have pain radiating to her back.   The history is provided by the patient.    Past Medical History  Diagnosis Date  . Hypertension   . Cyclical vomiting    Past Surgical History  Procedure Laterality Date  . Tonsillectomy    . Tubal ligation    . Cesarean section    . Cholecystectomy    . Cesarean section    . Tubal ligation      History reviewed. No pertinent family history. History  Substance Use Topics  . Smoking status: Current Every Day Smoker  . Smokeless tobacco: Never Used  . Alcohol Use: No   OB History    No data available     Review of Systems  Constitutional: Negative.  Negative for fever, chills, diaphoresis and fatigue.  HENT: Negative.  Negative for congestion.   Eyes: Negative.   Respiratory: Negative.  Negative for cough, chest tightness, shortness of breath and wheezing.   Cardiovascular: Negative for chest pain, palpitations and leg swelling.  Gastrointestinal: Positive for nausea, vomiting and abdominal pain. Negative for diarrhea, constipation, blood in stool, abdominal distention, anal bleeding and rectal pain.  Endocrine: Negative.   Genitourinary: Negative for dysuria, urgency, frequency, hematuria and flank pain.  Musculoskeletal: Negative for back pain.  Skin: Negative.   Allergic/Immunologic: Negative.   Hematological: Negative.   Psychiatric/Behavioral: Negative.       Allergies  Other; Peanut-containing drug products; Percocet; Tomato; and Zofran  Home Medications   Prior to Admission medications   Medication Sig Start Date End Date Taking? Authorizing Provider  flintstones complete (FLINTSTONES) 60 MG chewable tablet Chew 1 tablet by mouth daily.      Historical Provider, MD  HYDROmorphone HCl (DILAUDID PO) Take 100 mg by mouth 2 (two) times daily.    Historical Provider, MD  promethazine (PHENERGAN) 25 MG suppository Place 1 suppository (  25 mg total) rectally every 4 (four) hours as needed for nausea. 05/22/13 05/29/13  Timmothy Euler, MD  promethazine (PHENERGAN) 25 MG tablet Take 1 tablet (25 mg total) by mouth every 6 (six) hours as needed for nausea. 05/22/13   Timmothy Euler, MD  promethazine (PHENERGAN) 25 MG tablet Take 1 tablet (25 mg total) by mouth every 6 (six) hours as needed for nausea or vomiting. 01/22/14   Fransico Meadow, PA-C   BP 132/98 mmHg   Pulse 96  Temp(Src) 98.3 F (36.8 C) (Oral)  Resp 18  Ht 5' 3"  (1.6 m)  Wt 175 lb (79.379 kg)  BMI 31.01 kg/m2  SpO2 99%  LMP 01/14/2015 Physical Exam  Constitutional: She is oriented to person, place, and time. She appears well-developed and well-nourished. No distress.  HENT:  Head: Normocephalic and atraumatic.  Nose: Nose normal.  Mouth/Throat: Oropharynx is clear and moist.  Eyes: Conjunctivae and EOM are normal. Pupils are equal, round, and reactive to light.  Neck: Normal range of motion. Neck supple. No thyromegaly present.  Cardiovascular: Normal rate, regular rhythm, normal heart sounds and intact distal pulses.  Exam reveals no gallop and no friction rub.   No murmur heard. Pulmonary/Chest: Effort normal and breath sounds normal. No respiratory distress. She has no wheezes. She has no rales. She exhibits no tenderness.  Abdominal: Soft. Bowel sounds are normal. She exhibits no distension and no mass. There is tenderness. There is no rebound and no guarding.  Musculoskeletal: Normal range of motion. She exhibits no edema or tenderness.  Lymphadenopathy:    She has no cervical adenopathy.  Neurological: She is alert and oriented to person, place, and time. She displays normal reflexes. No cranial nerve deficit. She exhibits normal muscle tone. Coordination normal.  Skin: Skin is warm. No rash noted. She is not diaphoretic.  Psychiatric: She has a normal mood and affect.    ED Course  Procedures (including critical care time) Labs Review Labs Reviewed  URINALYSIS, ROUTINE W REFLEX MICROSCOPIC (NOT AT [facility]) - Abnormal; Notable for the following:    Hgb urine dipstick LARGE (*)    All other components within normal limits  URINE RAPID DRUG SCREEN, HOSP PERFORMED - Abnormal; Notable for the following:    Tetrahydrocannabinol POSITIVE (*)    All other components within normal limits  PREGNANCY, URINE  URINE MICROSCOPIC-ADD ON    Imaging Review No results found.   EKG  Interpretation None      MDM   Final diagnoses:  Non-intractable cyclical vomiting with nausea    Pt. Is a 29 y/o F with hx of cyclical vomiting syndrome, chronic abdominal pain, chronic narcotic use, here with continued nausea / vomiting. She has had multiple studies this year including CT scans, X-rays, blood work, and a gastric emptying study. All workup to date has been negative. She has been unable to get in to see her PCP. She has phenergan at home for nausea, and she has been able to keep some food / drink down. Orthostatic vital signs are normal, and she had a urine pregnancy test that was negative. She has hemoglobin in her urine, but is on her period and this has been positive on U/A in the past. She improved slightly with IM phenergan and was able to tolerate a small amount PO here. Her vital signs were stable and she was afebrile. She was found to be safe for discharge with close follow up with her PCP for ongoing treatment of  her symptoms.     Aquilla Hacker, MD 01/18/15 1549  Charlesetta Shanks, MD 01/19/15 [phone]

## 2015-01-18 NOTE — ED Notes (Addendum)
Pt amb to room 12 with quick steady gait in nad. Pt reports her cyclical vomiting syndrome onset Friday. Pt reports she was recently dx with fibroids and wonders if that is the cause. Pt rates her pain at 10/10, pt is smiling and laughing with this rn and her companion.

## 2015-01-18 NOTE — ED Notes (Signed)
Pt vomited . Reported this to the charge RN also pt requested that she receive nausea medicine other than by mouth.

## 2015-01-18 NOTE — ED Notes (Signed)
MD at bedside. 

## 2015-01-18 NOTE — Discharge Instructions (Signed)
Cyclic Vomiting Syndrome Cyclic vomiting syndrome is a benign condition in which patients experience bouts or cycles of severe nausea and vomiting that last for hours or even days. The bouts of nausea and vomiting alternate with longer periods of no symptoms and generally good health. Cyclic vomiting syndrome occurs mostly in children, but can affect adults. CAUSES  [facility] has no known cause. Each episode is typically similar to the previous ones. The episodes tend to:   Start at about the same time of day.  Last the same length of time.  Present the same symptoms at the same level of intensity. Cyclic vomiting syndrome can begin at any age in children and adults. Cyclic vomiting syndrome usually starts between the ages of 3 and 7 years. In adults, episodes tend to occur less often than they do in children, but they last longer. Furthermore, the events or situations that trigger episodes in adults cannot always be pinpointed as easily as they can in children. There are 4 phases of cyclic vomiting syndrome: 1. Prodrome. The prodrome phase signals that an episode of nausea and vomiting is about to begin. This phase can last from just a few minutes to several hours. This phase is often marked by belly (abdominal) pain. Sometimes taking medicine early in the prodrome phase can stop an episode in progress. However, sometimes there is no warning. A person may simply wake up in the middle of the night or early morning and begin vomiting. 2. Episode. The episode phase consists of:  Severe vomiting.  Nausea.  Gagging (retching). 3. Recovery. The recovery phase begins when the nausea and vomiting stop. Healthy color, appetite, and energy return. 4. Symptom-free interval. The symptom-free interval phase is the period between episodes when no symptoms are present. TRIGGERS Episodes can be triggered by an infection or event. Examples of triggers include:  Infections.  Colds, allergies, sinus problems, and  the flu.  Eating certain foods such as chocolate or cheese.  Foods with monosodium glutamate (MSG) or preservatives.  Fast foods.  Pre-packaged foods.  Foods with low nutritional value (junk foods).  Overeating.  Eating just before going to bed.  Hot weather.  Dehydration.  Not enough sleep or poor sleep quality.  Physical exhaustion.  Menstruation.  Motion sickness.  Emotional stress (school or home difficulties).  Excitement or stress. SYMPTOMS  The main symptoms of cyclic vomiting syndrome are:  Severe vomiting.  Nausea.  Gagging (retching). Episodes usually begin at night or the first thing in the morning. Episodes may include vomiting or retching up to 5 or 6 times an hour during the worst of the episode. Episodes usually last anywhere from 1 to 4 days. Episodes can last for up to 10 days. Other symptoms include:  Paleness.  Exhaustion.  Listlessness.  Abdominal pain.  Loose stools or diarrhea. Sometimes the nausea and vomiting are so severe that a person appears to be almost unconscious. Sensitivity to light, headache, fever, dizziness, may also accompany an episode. In addition, the vomiting may cause drooling and excessive thirst. Drinking water usually leads to more vomiting, though the water can dilute the acid in the vomit, making the episode a little less painful. Continuous vomiting can lead to dehydration, which means that the body has lost excessive water and salts. DIAGNOSIS  Cyclic vomiting syndrome is hard to diagnose because there are no clear tests to identify it. A caregiver must diagnose cyclic vomiting syndrome by looking at symptoms and medical history. A caregiver must exclude more common diseases  or disorders that can also cause nausea and vomiting. Also, diagnosis takes time because caregivers need to identify a pattern or cycle to the vomiting. TREATMENT  Cyclic vomiting syndrome cannot be cured. Treatment varies, but people with  cyclic vomiting syndrome should get plenty of rest and sleep and take medications that prevent, stop, or lessen the vomiting episodes and other symptoms. People whose episodes are frequent and long-lasting may be treated during the symptom-free intervals in an effort to prevent or ease future episodes. The symptom-free phase is a good time to eliminate anything known to trigger an episode. For example, if episodes are brought on by stress or excitement, this period is the time to find ways to reduce stress and stay calm. If sinus problems or allergies cause episodes, those conditions should be treated. The triggers listed above should be avoided or prevented. Because of the similarities between migraine and cyclic vomiting syndrome, caregivers treat some people with severe cyclic vomiting syndrome with drugs that are also used for migraine headaches. The drugs are designed to:  Prevent episodes.  Reduce their frequency.  Lessen their severity. HOME CARE INSTRUCTIONS Once a vomiting episode begins, treatment is supportive. It helps to stay in bed and sleep in a dark, quiet room. Severe nausea and vomiting may require hospitalization and intravenous (IV) fluids to prevent dehydration. Relaxing medications (sedatives) may help if the nausea continues. Sometimes, during the prodrome phase, it is possible to stop an episode from happening altogether. Only take over-the-counter or prescription medicines for pain, discomfort or fever as directed by your caregiver. Do not give aspirin to children. During the recovery phase, drinking water and replacing lost electrolytes (salts in the blood) are very important. Electrolytes are salts that the body needs to function well and stay healthy. Symptoms during the recovery phase can vary. Some people find that their appetites return to normal immediately, while others need to begin by drinking clear liquids and then move slowly to solid food. RELATED COMPLICATIONS The  severe vomiting that defines cyclic vomiting syndrome is a risk factor for several complications:  Dehydration--Vomiting causes the body to lose water quickly.  Electrolyte imbalance--Vomiting also causes the body to lose the important salts it needs to keep working properly.  Peptic esophagitis--The tube that connects the mouth to the stomach (esophagus) becomes injured from the stomach acid that comes up with the vomit.  Hematemesis--The esophagus becomes irritated and bleeds, so blood mixes with the vomit.  Mallory-Weiss tear--The lower end of the esophagus may tear open or the stomach may bruise from vomiting or retching.  Tooth decay--The acid in the vomit can hurt the teeth by corroding the tooth enamel. SEEK MEDICAL CARE IF: You have questions or problems. Document Released: 09/04/2001 Document Revised: 09/18/2011 Document Reviewed: 10/03/2010 [facility] Patient Information 2015 [place], [place]. This information is not intended to replace advice given to you by your health care provider. Make sure you discuss any questions you have with your health care provider.

## 2015-01-18 NOTE — ED Notes (Signed)
EDP is in another room performing a procedure. Will make him aware and ask for new med order when he is done.

## 2015-04-16 DIAGNOSIS — Z8719 Personal history of other diseases of the digestive system: Secondary | ICD-10-CM | POA: Insufficient documentation

## 2016-01-23 ENCOUNTER — Emergency Department (HOSPITAL_BASED_OUTPATIENT_CLINIC_OR_DEPARTMENT_OTHER)
Admission: EM | Admit: 2016-01-23 | Discharge: 2016-01-23 | Disposition: A | Discharge status: Home / Self Care | Payer: Medicaid Other | Attending: Emergency Medicine | Admitting: Emergency Medicine

## 2016-01-23 ENCOUNTER — Encounter (HOSPITAL_BASED_OUTPATIENT_CLINIC_OR_DEPARTMENT_OTHER): Payer: Self-pay | Admitting: *Deleted

## 2016-01-23 DIAGNOSIS — G43A0 Cyclical vomiting, not intractable: Secondary | ICD-10-CM | POA: Diagnosis not present

## 2016-01-23 DIAGNOSIS — F17200 Nicotine dependence, unspecified, uncomplicated: Secondary | ICD-10-CM | POA: Diagnosis not present

## 2016-01-23 DIAGNOSIS — G8929 Other chronic pain: Secondary | ICD-10-CM

## 2016-01-23 DIAGNOSIS — R1084 Generalized abdominal pain: Secondary | ICD-10-CM | POA: Diagnosis present

## 2016-01-23 DIAGNOSIS — R109 Unspecified abdominal pain: Secondary | ICD-10-CM

## 2016-01-23 DIAGNOSIS — I10 Essential (primary) hypertension: Secondary | ICD-10-CM | POA: Insufficient documentation

## 2016-01-23 DIAGNOSIS — R1115 Cyclical vomiting syndrome unrelated to migraine: Secondary | ICD-10-CM

## 2016-01-23 LAB — COMPREHENSIVE METABOLIC PANEL
ALK PHOS: 101 U/L (ref 38–126)
ALT: 16 U/L (ref 14–54)
AST: 23 U/L (ref 15–41)
Albumin: 4.8 g/dL (ref 3.5–5.0)
Anion gap: 11 (ref 5–15)
BUN: 12 mg/dL (ref 6–20)
CALCIUM: 9.7 mg/dL (ref 8.9–10.3)
CHLORIDE: 108 mmol/L (ref 101–111)
CO2: 21 mmol/L — AB (ref 22–32)
Creatinine, Ser: 0.9 mg/dL (ref 0.44–1.00)
GFR calc Af Amer: >60 mL/min (ref >60)
Glucose, Bld: 116 mg/dL — ABNORMAL HIGH (ref 65–99)
Potassium: 3.5 mmol/L (ref 3.5–5.1)
Sodium: 140 mmol/L (ref 135–145)
Total Bilirubin: 0.5 mg/dL (ref 0.3–1.2)
Total Protein: 8.8 g/dL — ABNORMAL HIGH (ref 6.5–8.1)

## 2016-01-23 LAB — LIPASE, BLOOD: Lipase: 22 U/L (ref 11–51)

## 2016-01-23 LAB — CBC WITH DIFFERENTIAL/PLATELET
BASOS ABS: 0 10*3/uL (ref 0.0–0.1)
Basophils Relative: 0 %
EOS PCT: 0 %
Eosinophils Absolute: 0 10*3/uL (ref 0.0–0.7)
HCT: 41.2 % (ref 36.0–46.0)
HEMOGLOBIN: 13.9 g/dL (ref 12.0–15.0)
LYMPHS ABS: 1.2 10*3/uL (ref 0.7–4.0)
LYMPHS PCT: 7 %
MCH: 29.1 pg (ref 26.0–34.0)
MCHC: 33.7 g/dL (ref 30.0–36.0)
MCV: 86.2 fL (ref 78.0–100.0)
Monocytes Absolute: 0.7 10*3/uL (ref 0.1–1.0)
Monocytes Relative: 4 %
NEUTROS ABS: 14.1 10*3/uL — AB (ref 1.7–7.7)
NEUTROS PCT: 89 %
PLATELETS: 278 10*3/uL (ref 150–400)
RBC: 4.78 MIL/uL (ref 3.87–5.11)
RDW: 14.7 % (ref 11.5–15.5)
WBC: 16 10*3/uL — AB (ref 4.0–10.5)

## 2016-01-23 MED ORDER — PROMETHAZINE HCL 25 MG PO TABS: 25.0000 mg | ORAL_TABLET | Freq: Four times a day (QID) | ORAL | Status: DC | PRN

## 2016-01-23 MED ORDER — LORAZEPAM 2 MG/ML IJ SOLN
0.5000 mg | Freq: Once | INTRAMUSCULAR | Status: AC
  Administered 2016-01-23: 0.5 mg via INTRAVENOUS
  Filled 2016-01-23: qty 1

## 2016-01-23 MED ORDER — SODIUM CHLORIDE 0.9 % IV BOLUS (SEPSIS)
1000.0000 mL | Freq: Once | INTRAVENOUS | Status: AC
  Administered 2016-01-23: 1000 mL via INTRAVENOUS

## 2016-01-23 MED ORDER — HYDROMORPHONE HCL 1 MG/ML IJ SOLN
1.0000 mg | Freq: Once | INTRAMUSCULAR | Status: AC
  Administered 2016-01-23: 1 mg via INTRAVENOUS
  Filled 2016-01-23: qty 1

## 2016-01-23 MED ORDER — PROMETHAZINE HCL 25 MG/ML IJ SOLN
12.5000 mg | Freq: Once | INTRAMUSCULAR | Status: AC
  Administered 2016-01-23: 12.5 mg via INTRAVENOUS
  Filled 2016-01-23: qty 1

## 2016-01-23 MED ORDER — DIPHENHYDRAMINE HCL 50 MG/ML IJ SOLN
25.0000 mg | Freq: Once | INTRAMUSCULAR | Status: AC
  Administered 2016-01-23: 25 mg via INTRAVENOUS
  Filled 2016-01-23: qty 1

## 2016-01-23 NOTE — ED Notes (Signed)
IV in right AC was positional and the catheter could not be advanced any further due to a valve. Nurse asked for another site to be started.

## 2016-01-23 NOTE — ED Notes (Signed)
Pt not answering questions in triage.  Noted to be dry heaving.  Shakes head no to chrons, shakes head yes to cyclical vomiting.

## 2016-01-23 NOTE — ED Notes (Signed)
Pt states she has a hx of cyclic vomiting monthly. This episode started this a.m. Actively vomiting.

## 2016-01-23 NOTE — ED Provider Notes (Signed)
4:45 PM: patient care assumed from [facility], PA-C at shift change.  Briefly, Patient with hx of cyclical vomiting syndrome presenting with vomiting today.  Has received Phenergan, Ativan, and Benadryl.  Labs largely unremarkable.  She does have a leukocytosis, WBC 16; this is chronic after review on care everywhere.  Has had multiple negative GI workups.  Patient received 1 mg dilaudid.  Will not order any more.  Upon reassessment, patient able to tolerate by mouth intake without any difficulty. Her symptoms have completely resolved. We'll discharge home with Phenergan. Follow-up GI. Return precautions discussed. Patient agrees and acknowledges the above plan for discharge.  Gloriann Loan, PA-C 01/23/16 [place], MD 01/24/16 2110

## 2016-01-23 NOTE — Discharge Instructions (Signed)

## 2016-01-23 NOTE — ED Provider Notes (Signed)
CSN: [id number]     Arrival date & time 01/23/16  1421 History   First MD Initiated Contact with Patient 01/23/16 1518     Chief Complaint  Patient presents with  . Abdominal Pain   HPI  Ms. Gallacher is a 30 year old female past medical history of hypertension and cyclical vomiting syndrome presenting with vomiting and abdominal pain. Onset of symptoms was this morning. Pt states this is typical of her chronic vomiting/abdominal pain presentation. The abdominal pain is generalized and worsens with palpation. She reports having phenergan PO and suppositories at home but states she vomited the phenergan up. She has not tried the suppository. She also states that she takes PO dilaudid for her chronic abdominal pain which she was unable to keep down today. Denies recent marijuana use; states last use was one month ago. Pt follows with a gastroenterologist but states "they don't help me". Denies fevers, chills, dizziness, syncope, chest pain, SOB, diarrhea or GU symptoms.  Chart review: pt seen frequently by [facility] ED in 0315 for cyclic vomiting. Review of outside records shows pt has had 23 visits for cyclic vomiting to [facility] ED in past 6 months. One episode for intractable symptoms. Patient has had multiple GI work ups including abd CTs and gastric emptying tests which have been largely unremarkable.   Past Medical History  Diagnosis Date  . Hypertension   . Cyclical vomiting    Past Surgical History  Procedure Laterality Date  . Tonsillectomy    . Tubal ligation    . Cesarean section    . Cholecystectomy    . Cesarean section    . Tubal ligation     History reviewed. No pertinent family history. Social History  Substance Use Topics  . Smoking status: Current Every Day Smoker  . Smokeless tobacco: Never Used  . Alcohol Use: No   OB History    No data available     Review of Systems  All other systems reviewed and are negative.     Allergies  Other; Peanut-containing  drug products; Percocet; Tomato; and Zofran  Home Medications   Prior to Admission medications   Medication Sig Start Date End Date Taking? Authorizing Provider  flintstones complete (FLINTSTONES) 60 MG chewable tablet Chew 1 tablet by mouth daily.      Historical Provider, MD  HYDROmorphone HCl (DILAUDID PO) Take 100 mg by mouth 2 (two) times daily.    Historical Provider, MD  promethazine (PHENERGAN) 25 MG suppository Place 1 suppository (25 mg total) rectally every 4 (four) hours as needed for nausea. 05/22/13 05/29/13  Timmothy Euler, MD  promethazine (PHENERGAN) 25 MG tablet Take 1 tablet (25 mg total) by mouth every 6 (six) hours as needed for nausea. 05/22/13   Timmothy Euler, MD  promethazine (PHENERGAN) 25 MG tablet Take 1 tablet (25 mg total) by mouth every 6 (six) hours as needed for nausea or vomiting. 01/22/14   Fransico Meadow, PA-C   BP 147/98 mmHg  Pulse 102  Temp(Src) 98.2 F (36.8 C) (Oral)  Resp 18  Ht 5' 3"  (1.6 m)  Wt 77.111 kg  BMI 30.12 kg/m2  SpO2 100%  LMP 01/23/2016 Physical Exam  Constitutional: She appears well-developed and well-nourished. No distress.  Nontoxic appearing  HENT:  Head: Normocephalic and atraumatic.  Eyes: Conjunctivae are normal. Right eye exhibits no discharge. Left eye exhibits no discharge. No scleral icterus.  Neck: Normal range of motion.  Cardiovascular: Normal rate, regular rhythm and normal heart  sounds.   Pulmonary/Chest: Effort normal and breath sounds normal. No respiratory distress.  Abdominal: Soft. Bowel sounds are normal. There is generalized tenderness. There is no rigidity, no rebound and no guarding.  Dry heaving  Musculoskeletal: Normal range of motion.  Neurological: She is alert. Coordination normal.  Skin: Skin is warm and dry.  Psychiatric: She has a normal mood and affect. Her behavior is normal.  Nursing note and vitals reviewed.   ED Course  Procedures (including critical care time) Labs Review Labs  Reviewed  CBC WITH DIFFERENTIAL/PLATELET - Abnormal; Notable for the following:    WBC 16.0 (*)    Neutro Abs 14.1 (*)    All other components within normal limits  COMPREHENSIVE METABOLIC PANEL - Abnormal; Notable for the following:    CO2 21 (*)    Glucose, Bld 116 (*)    Total Protein 8.8 (*)    All other components within normal limits  LIPASE, BLOOD    Imaging Review No results found. I have personally reviewed and evaluated these images and lab results as part of my medical decision-making.   EKG Interpretation None      MDM   Final diagnoses:  Non-intractable cyclical vomiting with nausea  Chronic abdominal pain   30 year old female with hx of cyclic vomiting and chronic abdominal pain presenting with vomiting and abdominal pain. Afebrile. Hypertensive and tachycardic but pt is actively and frequently retching. Pt is nontoxic appearing. Abdomen is soft with generalized TTP. No peritoneal signs. Leukocytosis of 16. Grace and outside charts reviewed; patient has hx of intermittently elevated WBC with her presentations of cyclic vomiting with negative work ups each time. Likely nonspecific stress reaction due to pt's intense vomiting. Fluid bolus, phenergan and a single dose of dilaudid were given which alleviated symptoms for a short time. Pt began vomiting again and ativan and benadryl were given as this appears to have been helpful in symptom control in the past. Chart extensively reviewed and current presentation seems unchanged from past episodes. I do not feel that imaging is indicated at this time. Patient care signed out to oncoming provider, Gloriann Loan, PA-C, who will continue monitoring symptoms and re-evaluate pt before discharge. Patient will not be discharged with narcotic medications and has been strongly encouraged to follow up with her gastroenterologist.    Josephina Gip, PA-C 01/23/16 [place], MD 01/23/16 [phone]

## 2016-07-19 DIAGNOSIS — K439 Ventral hernia without obstruction or gangrene: Secondary | ICD-10-CM | POA: Insufficient documentation

## 2017-02-07 DIAGNOSIS — K644 Residual hemorrhoidal skin tags: Secondary | ICD-10-CM | POA: Insufficient documentation

## 2017-05-25 ENCOUNTER — Emergency Department (HOSPITAL_COMMUNITY): Payer: Medicaid Other

## 2017-05-25 ENCOUNTER — Encounter (HOSPITAL_COMMUNITY): Payer: Self-pay | Admitting: *Deleted

## 2017-05-25 ENCOUNTER — Emergency Department (HOSPITAL_COMMUNITY)
Admission: EM | Admit: 2017-05-25 | Discharge: 2017-05-26 | Disposition: A | Discharge status: Home / Self Care | Payer: Medicaid Other | Attending: Emergency Medicine | Admitting: Emergency Medicine

## 2017-05-25 DIAGNOSIS — Y929 Unspecified place or not applicable: Secondary | ICD-10-CM | POA: Diagnosis not present

## 2017-05-25 DIAGNOSIS — S1183XA Puncture wound without foreign body of other specified part of neck, initial encounter: Secondary | ICD-10-CM | POA: Insufficient documentation

## 2017-05-25 DIAGNOSIS — Y939 Activity, unspecified: Secondary | ICD-10-CM | POA: Insufficient documentation

## 2017-05-25 DIAGNOSIS — Y999 Unspecified external cause status: Secondary | ICD-10-CM | POA: Insufficient documentation

## 2017-05-25 DIAGNOSIS — S1980XA Other specified injuries of unspecified part of neck, initial encounter: Secondary | ICD-10-CM | POA: Diagnosis present

## 2017-05-25 DIAGNOSIS — T148XXA Other injury of unspecified body region, initial encounter: Secondary | ICD-10-CM

## 2017-05-25 HISTORY — DX: Polycystic ovarian syndrome: E28.2

## 2017-05-25 HISTORY — DX: Crohn's disease, unspecified, without complications: K50.90

## 2017-05-25 LAB — BPAM FFP
BLOOD PRODUCT EXPIRATION DATE: 201811192359
Blood Product Expiration Date: 201811192359
ISSUE DATE / TIME: 201811161924
ISSUE DATE / TIME: 201811161924
UNIT TYPE AND RH: 6200
Unit Type and Rh: 6200

## 2017-05-25 LAB — PREPARE FRESH FROZEN PLASMA
UNIT DIVISION: 0
Unit division: 0

## 2017-05-25 LAB — BPAM RBC
BLOOD PRODUCT EXPIRATION DATE: 201811272359
BLOOD PRODUCT EXPIRATION DATE: 201811272359
ISSUE DATE / TIME: 201811161923
ISSUE DATE / TIME: 201811161923
UNIT TYPE AND RH: 9500
UNIT TYPE AND RH: 9500

## 2017-05-25 MED ORDER — IOPAMIDOL (ISOVUE-370) INJECTION 76%
INTRAVENOUS | Status: AC
  Administered 2017-05-25: 50 mL
  Filled 2017-05-25: qty 50

## 2017-05-25 MED ORDER — HYDROCODONE-ACETAMINOPHEN 5-325 MG PO TABS: 1.0000 | ORAL_TABLET | Freq: Four times a day (QID) | ORAL | 0 refills | Status: DC | PRN

## 2017-05-25 MED ORDER — IBUPROFEN 800 MG PO TABS: 800.0000 mg | ORAL_TABLET | Freq: Three times a day (TID) | ORAL | 0 refills | Status: DC

## 2017-05-25 MED ORDER — KETOROLAC TROMETHAMINE 30 MG/ML IJ SOLN
15.0000 mg | Freq: Once | INTRAMUSCULAR | Status: AC
  Administered 2017-05-25: 15 mg via INTRAVENOUS
  Filled 2017-05-25: qty 1

## 2017-05-25 MED ORDER — LIDOCAINE-EPINEPHRINE 1 %-1:100000 IJ SOLN
20.0000 mL | Freq: Once | INTRAMUSCULAR | Status: AC
  Administered 2017-05-25: 20 mL via INTRADERMAL
  Filled 2017-05-25: qty 20

## 2017-05-25 MED ORDER — LORAZEPAM 2 MG/ML IJ SOLN
INTRAMUSCULAR | Status: AC
  Filled 2017-05-25: qty 1

## 2017-05-25 MED ORDER — DIAZEPAM 5 MG PO TABS
2.5000 mg | ORAL_TABLET | Freq: Three times a day (TID) | ORAL | 0 refills | Status: DC | PRN
Start: 2017-05-25 — End: 2019-04-29

## 2017-05-25 MED ORDER — LORAZEPAM 2 MG/ML IJ SOLN
1.0000 mg | Freq: Once | INTRAMUSCULAR | Status: AC
  Administered 2017-05-25: 1 mg via INTRAVENOUS

## 2017-05-25 MED ORDER — TETANUS-DIPHTH-ACELL PERTUSSIS 5-2.5-18.5 LF-MCG/0.5 IM SUSP
0.5000 mL | Freq: Once | INTRAMUSCULAR | Status: DC
  Filled 2017-05-25: qty 0.5

## 2017-05-25 MED ORDER — DIAZEPAM 2 MG PO TABS
2.0000 mg | ORAL_TABLET | Freq: Once | ORAL | Status: DC
  Filled 2017-05-25: qty 1

## 2017-05-25 NOTE — ED Notes (Signed)
IV team at bedside 

## 2017-05-25 NOTE — ED Notes (Signed)
Advised CT of IV

## 2017-05-25 NOTE — ED Notes (Signed)
Pt ambulated to bathroom 

## 2017-05-25 NOTE — ED Notes (Signed)
Pt wanting neck stitched up, explained had to wait on lidocaine to come from pharmacy.  Will notify Dr. Dayna Barker.  C/o neck starting to hurt more
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
## 2017-05-25 NOTE — ED Notes (Signed)
Emilie, RN took bandage off pts neck for CSI to document

## 2017-05-29 ENCOUNTER — Encounter (HOSPITAL_BASED_OUTPATIENT_CLINIC_OR_DEPARTMENT_OTHER): Payer: Self-pay | Admitting: *Deleted

## 2019-03-09 ENCOUNTER — Emergency Department (HOSPITAL_BASED_OUTPATIENT_CLINIC_OR_DEPARTMENT_OTHER)
Admission: EM | Admit: 2019-03-09 | Discharge: 2019-03-10 | Disposition: A | Discharge status: Home / Self Care | Payer: Medicaid Other | Attending: Emergency Medicine | Admitting: Emergency Medicine

## 2019-03-09 ENCOUNTER — Other Ambulatory Visit: Payer: Self-pay

## 2019-03-09 ENCOUNTER — Encounter (HOSPITAL_BASED_OUTPATIENT_CLINIC_OR_DEPARTMENT_OTHER): Payer: Self-pay | Admitting: Emergency Medicine

## 2019-03-09 DIAGNOSIS — R1115 Cyclical vomiting syndrome unrelated to migraine: Secondary | ICD-10-CM | POA: Diagnosis not present

## 2019-03-09 DIAGNOSIS — R630 Anorexia: Secondary | ICD-10-CM | POA: Insufficient documentation

## 2019-03-09 DIAGNOSIS — Z91010 Allergy to peanuts: Secondary | ICD-10-CM | POA: Insufficient documentation

## 2019-03-09 DIAGNOSIS — F17200 Nicotine dependence, unspecified, uncomplicated: Secondary | ICD-10-CM | POA: Diagnosis not present

## 2019-03-09 DIAGNOSIS — R197 Diarrhea, unspecified: Secondary | ICD-10-CM | POA: Diagnosis not present

## 2019-03-09 DIAGNOSIS — Z79899 Other long term (current) drug therapy: Secondary | ICD-10-CM | POA: Insufficient documentation

## 2019-03-09 DIAGNOSIS — R112 Nausea with vomiting, unspecified: Secondary | ICD-10-CM | POA: Diagnosis present

## 2019-03-09 DIAGNOSIS — I10 Essential (primary) hypertension: Secondary | ICD-10-CM | POA: Diagnosis not present

## 2019-03-09 DIAGNOSIS — R1033 Periumbilical pain: Secondary | ICD-10-CM | POA: Insufficient documentation

## 2019-03-09 LAB — COMPREHENSIVE METABOLIC PANEL
ALT: 13 U/L (ref 0–44)
AST: 17 U/L (ref 15–41)
Albumin: 4.5 g/dL (ref 3.5–5.0)
Alkaline Phosphatase: 71 U/L (ref 38–126)
Anion gap: 13 (ref 5–15)
BUN: 10 mg/dL (ref 6–20)
CO2: 20 mmol/L — ABNORMAL LOW (ref 22–32)
Calcium: 9.9 mg/dL (ref 8.9–10.3)
Chloride: 105 mmol/L (ref 98–111)
Creatinine, Ser: 0.84 mg/dL (ref 0.44–1.00)
GFR calc Af Amer: >60 mL/min (ref >60)
GFR calc non Af Amer: >60 mL/min (ref >60)
Glucose, Bld: 126 mg/dL — ABNORMAL HIGH (ref 70–99)
Potassium: 3.3 mmol/L — ABNORMAL LOW (ref 3.5–5.1)
Sodium: 138 mmol/L (ref 135–145)
Total Bilirubin: 0.8 mg/dL (ref 0.3–1.2)
Total Protein: 8.4 g/dL — ABNORMAL HIGH (ref 6.5–8.1)

## 2019-03-09 LAB — CBC WITH DIFFERENTIAL/PLATELET
Abs Immature Granulocytes: 0.07 10*3/uL (ref 0.00–0.07)
Basophils Absolute: 0.1 10*3/uL (ref 0.0–0.1)
Basophils Relative: 0 %
Eosinophils Absolute: 0.1 10*3/uL (ref 0.0–0.5)
Eosinophils Relative: 0 %
HCT: 40.4 % (ref 36.0–46.0)
Hemoglobin: 13 g/dL (ref 12.0–15.0)
Immature Granulocytes: 1 %
Lymphocytes Relative: 7 %
Lymphs Abs: 1 10*3/uL (ref 0.7–4.0)
MCH: 29.7 pg (ref 26.0–34.0)
MCHC: 32.2 g/dL (ref 30.0–36.0)
MCV: 92.4 fL (ref 80.0–100.0)
Monocytes Absolute: 0.6 10*3/uL (ref 0.1–1.0)
Monocytes Relative: 4 %
Neutro Abs: 13.7 10*3/uL — ABNORMAL HIGH (ref 1.7–7.7)
Neutrophils Relative %: 88 %
Platelets: 385 10*3/uL (ref 150–400)
RBC: 4.37 MIL/uL (ref 3.87–5.11)
RDW: 14 % (ref 11.5–15.5)
WBC: 15.5 10*3/uL — ABNORMAL HIGH (ref 4.0–10.5)
nRBC: 0 % (ref 0.0–0.2)

## 2019-03-09 LAB — LIPASE, BLOOD: Lipase: 27 U/L (ref 11–51)

## 2019-03-09 MED ORDER — LACTATED RINGERS IV BOLUS
1000.0000 mL | Freq: Once | INTRAVENOUS | Status: AC
  Administered 2019-03-09: 21:00:00 1000 mL via INTRAVENOUS

## 2019-03-09 MED ORDER — KETAMINE HCL 10 MG/ML IJ SOLN
0.3000 mg/kg | Freq: Once | INTRAMUSCULAR | Status: AC
  Administered 2019-03-09: 22 mg via INTRAVENOUS

## 2019-03-09 MED ORDER — LACTATED RINGERS IV BOLUS
1000.0000 mL | Freq: Once | INTRAVENOUS | Status: AC
  Administered 2019-03-09: 1000 mL via INTRAVENOUS

## 2019-03-09 MED ORDER — KETAMINE HCL 10 MG/ML IJ SOLN
INTRAMUSCULAR | Status: AC
  Filled 2019-03-09: qty 1

## 2019-03-09 MED ORDER — HYDROMORPHONE HCL 1 MG/ML IJ SOLN
1.0000 mg | Freq: Once | INTRAMUSCULAR | Status: AC
  Administered 2019-03-09: 1 mg via INTRAVENOUS
  Filled 2019-03-09: qty 1

## 2019-03-09 MED ORDER — KETAMINE HCL 10 MG/ML IJ SOLN
0.3000 mg/kg | Freq: Once | INTRAMUSCULAR | Status: AC
  Administered 2019-03-09: 22 mg via INTRAVENOUS
  Filled 2019-03-09: qty 1

## 2019-03-09 MED ORDER — LORAZEPAM 2 MG/ML IJ SOLN
1.0000 mg | Freq: Once | INTRAMUSCULAR | Status: AC
  Administered 2019-03-09: 1 mg via INTRAVENOUS
  Filled 2019-03-09: qty 1

## 2019-03-09 MED ORDER — PROMETHAZINE HCL 25 MG/ML IJ SOLN
25.0000 mg | Freq: Once | INTRAMUSCULAR | Status: AC
  Administered 2019-03-09: 21:00:00 25 mg via INTRAVENOUS
  Filled 2019-03-09: qty 1

## 2019-03-09 NOTE — ED Triage Notes (Addendum)
Upper abd pain and vomiting today. Actively vomiting in triage

## 2019-03-09 NOTE — ED Notes (Signed)
Patient has been sitting up curled forward on bed.  Unable to get her to lie down.  Vomiting on and off.

## 2019-03-09 NOTE — ED Provider Notes (Signed)
[facility] Provider Note   CSN: [id number] Arrival date & time: 03/09/19  1831     History   Chief Complaint Chief Complaint  Patient presents with  . Abdominal Pain    HPI QUINA WILBOURNE is a 33 y.o. female.     Patient is a 33 year old female with a history of cyclical vomiting, endometriosis with recurrent emergency room visits and hospitalizations on a monthly basis for intractable nausea and vomiting.  Patient was last admitted and discharged from [facility] on 02/24/2019 after having persistent nausea and vomiting and a urinary tract infection.  Patient states she was doing well until 1 PM today when she started having nausea and vomiting that is currently worsening.  She states the pain in her abdomen is in the same spot that it always is in the vomiting is been persistent.  She denies any hematemesis but has had some loose stool when all of this started.  She denies fever, cough, shortness of breath.  She has multiple drug allergies and states usually ketamine, Phenergan are helpful.  She has intermittent cannabis use but does not use heavily.  She denies any vaginal discharge, urinary symptoms and is not currently on her menses  The history is provided by the patient.  Abdominal Pain Pain location:  Periumbilical and epigastric Pain quality: aching, cramping and gnawing   Pain radiates to:  Does not radiate Pain severity:  Severe Onset quality:  Gradual Duration:  8 hours Timing:  Constant Progression:  Worsening Chronicity:  Recurrent Context comment:  Hx of cyclical vomiting, PCOS and crohns and HTN Relieved by:  Nothing Worsened by:  Eating Ineffective treatments: po meds at home. Associated symptoms: anorexia, diarrhea, nausea and vomiting   Associated symptoms: no chest pain, no chills, no constipation, no cough, no fever and no shortness of breath     Past Medical History:  Diagnosis Date  . Crohn's disease ([place])   .  Cyclical vomiting   . Hypertension   . PCOS (polycystic ovarian syndrome)     There are no active problems to display for this patient.   Past Surgical History:  Procedure Laterality Date  . CESAREAN SECTION    . CESAREAN SECTION    . CHOLECYSTECTOMY    . HERNIA REPAIR    . TONSILLECTOMY    . TUBAL LIGATION    . TUBAL LIGATION       OB History   No obstetric history on file.      Home Medications    Prior to Admission medications   Medication Sig Start Date End Date Taking? Authorizing Provider  diazepam (VALIUM) 5 MG tablet Take 0.5 tablets (2.5 mg total) every 8 (eight) hours as needed by mouth for anxiety or muscle spasms (spasms). 05/25/17   Mesner, Corene Cornea, MD  DOXEPIN HCL PO Take 1 capsule at bedtime by mouth.    [provider]  flintstones complete (FLINTSTONES) 60 MG chewable tablet Chew 1 tablet by mouth daily.      [provider]  GABAPENTIN PO Take 1 capsule 3 (three) times daily by mouth.    [provider]  HYDROcodone-acetaminophen (NORCO/VICODIN) 5-325 MG tablet Take 1-2 tablets every 6 (six) hours as needed by mouth for severe pain. 05/25/17   Mesner, Corene Cornea, MD  HYDROmorphone HCl (DILAUDID PO) Take 100 mg by mouth 2 (two) times daily.    [provider]  ibuprofen (ADVIL,MOTRIN) 800 MG tablet Take 1 tablet (800 mg total) 3 (three)  times daily by mouth. 05/25/17   Mesner, Corene Cornea, MD  LABETALOL HCL PO Take 1 tablet 2 (two) times daily by mouth.    [provider]  LORazepam (ATIVAN PO) Take 1 tablet as needed by mouth (for anxiety).    [provider]  promethazine (PHENERGAN) 25 MG tablet Take 1 tablet (25 mg total) by mouth every 6 (six) hours as needed for nausea or vomiting. 01/23/16   Gloriann Loan, PA-C  Promethazine HCl (PHENERGAN PO) Take 1 tablet as needed by mouth (for nausea).    [provider]    Family History No family history on file.  Social History Social History   Tobacco Use   . Smoking status: Current Every Day Smoker  . Smokeless tobacco: Never Used  Substance Use Topics  . Alcohol use: No    Frequency: Never  . Drug use: No     Allergies   Percocet [oxycodone-acetaminophen], Zofran [ondansetron hcl], Other, Peanut-containing drug products, Percocet [oxycodone-acetaminophen], Tomato, and Zofran   Review of Systems Review of Systems  Constitutional: Negative for chills and fever.  Respiratory: Negative for cough and shortness of breath.   Cardiovascular: Negative for chest pain.  Gastrointestinal: Positive for abdominal pain, anorexia, diarrhea, nausea and vomiting. Negative for constipation.  All other systems reviewed and are negative.    Physical Exam Updated Vital Signs Pulse (!) 114   Temp 98.1 F (36.7 C) (Axillary)   Resp 14   Ht 5' 3"  (1.6 m)   Wt 72.6 kg   LMP 02/24/2019   SpO2 100%   BMI 28.34 kg/m   Physical Exam Vitals signs and nursing note reviewed.  Constitutional:      General: She is in acute distress.     Appearance: She is well-developed and normal weight.     Comments: Patient is curled up in a ball and hunched over an emesis bag with intermittent heaving  HENT:     Head: Normocephalic and atraumatic.  Eyes:     Pupils: Pupils are equal, round, and reactive to light.  Cardiovascular:     Rate and Rhythm: Regular rhythm. Tachycardia present.     Heart sounds: Normal heart sounds. No murmur. No friction rub.  Pulmonary:     Effort: Pulmonary effort is normal.     Breath sounds: Normal breath sounds. No wheezing or rales.  Abdominal:     General: Bowel sounds are normal. There is no distension.     Palpations: Abdomen is soft.     Tenderness: There is abdominal tenderness in the epigastric area and periumbilical area. There is no guarding or rebound.     Hernia: No hernia is present.  Musculoskeletal: Normal range of motion.        General: No tenderness.     Comments: No edema  Skin:    General: Skin is warm  and dry.     Findings: No rash.  Neurological:     General: No focal deficit present.     Mental Status: She is alert and oriented to person, place, and time. Mental status is at baseline.     Cranial Nerves: No cranial nerve deficit.  Psychiatric:        Mood and Affect: Mood normal.        Behavior: Behavior normal.        Thought Content: Thought content normal.      ED Treatments / Results  Labs (all labs ordered are listed, but only abnormal results are displayed)  Labs Reviewed  CBC WITH DIFFERENTIAL/PLATELET - Abnormal; Notable for the following components:      Result Value   WBC 15.5 (*)    Neutro Abs 13.7 (*)    All other components within normal limits  COMPREHENSIVE METABOLIC PANEL - Abnormal; Notable for the following components:   Potassium 3.3 (*)    CO2 20 (*)    Glucose, Bld 126 (*)    Total Protein 8.4 (*)    All other components within normal limits  LIPASE, BLOOD    EKG EKG Interpretation  Date/Time:  Sunday March 09 2019 20:12:09 EDT Ventricular Rate:  118 PR Interval:    QRS Duration: 88 QT Interval:  314 QTC Calculation: 440 R Axis:   81 Text Interpretation:  Sinus tachycardia Probable left atrial enlargement Repol abnrm suggests ischemia, inferior leads Baseline wander in lead(s) I aVL No significant change since last tracing Confirmed by Blanchie Dessert [phone]) on 03/09/2019 8:50:54 PM   Radiology No results found.  Procedures Procedures (including critical care time)  Medications Ordered in ED Medications  promethazine (PHENERGAN) injection 25 mg (25 mg Intravenous Given 03/09/19 2033)  ketamine (KETALAR) injection 22 mg (22 mg Intravenous Not Given 03/09/19 2138)  lactated ringers bolus 1,000 mL (0 mLs Intravenous Stopped 03/09/19 2210)  ketamine (KETALAR) injection 22 mg (22 mg Intravenous Given 03/09/19 2133)  LORazepam (ATIVAN) injection 1 mg (1 mg Intravenous Given 03/09/19 2223)  HYDROmorphone (DILAUDID) injection 1 mg (1 mg  Intravenous Given 03/09/19 2223)     Initial Impression / Assessment and Plan / ED Course  I have reviewed the triage vital signs and the nursing notes.  Pertinent labs & imaging results that were available during my care of the patient were reviewed by me and considered in my medical decision making (see chart for details).        Patient is a 33 year old female presenting today with recurrent nausea and vomiting.  Patient is seen several times a month for intractable nausea no vomiting from cyclical vomiting and thought to be related to endometriosis.  Patient was last hospitalized on 02/22/2019 and discharged on 02/24/2019 after receiving multiple doses of Phenergan and being treated for a UTI.  Patient denies any urinary symptoms at this time or vaginal discharge.  She is tachycardic upon arrival and repetitively vomiting.  She has a leukocytosis of 15,000 which seems to be fairly typical when she comes in with an attack.  She has normal CMP and lipase and EKG shows a normal QT interval with sinus tachycardia.  Patient was given IV fluids of LR, 2 doses of ketamine, Phenergan and then Ativan and Dilaudid.  Patient is requesting something to drink.  Heart rate has improved to 90-100.  Will give a second bolus.  Final Clinical Impressions(s) / ED Diagnoses   Final diagnoses:  None    ED Discharge Orders    None       Blanchie Dessert, MD 03/10/19 0003

## 2019-03-09 NOTE — ED Notes (Signed)
Patient now lying on bed comfortably.  Reports feeling better.  Requesting ice water.  Provided with okay from physician.

## 2019-03-10 ENCOUNTER — Encounter (HOSPITAL_BASED_OUTPATIENT_CLINIC_OR_DEPARTMENT_OTHER): Payer: Self-pay

## 2019-03-10 ENCOUNTER — Other Ambulatory Visit: Payer: Self-pay

## 2019-03-10 ENCOUNTER — Emergency Department (HOSPITAL_BASED_OUTPATIENT_CLINIC_OR_DEPARTMENT_OTHER)
Admission: EM | Admit: 2019-03-10 | Discharge: 2019-03-11 | Disposition: A | Discharge status: Home / Self Care | Payer: Medicaid Other | Source: Home / Self Care | Attending: Emergency Medicine | Admitting: Emergency Medicine

## 2019-03-10 DIAGNOSIS — R1084 Generalized abdominal pain: Secondary | ICD-10-CM | POA: Insufficient documentation

## 2019-03-10 DIAGNOSIS — Z79899 Other long term (current) drug therapy: Secondary | ICD-10-CM | POA: Insufficient documentation

## 2019-03-10 DIAGNOSIS — R1115 Cyclical vomiting syndrome unrelated to migraine: Secondary | ICD-10-CM

## 2019-03-10 DIAGNOSIS — R112 Nausea with vomiting, unspecified: Secondary | ICD-10-CM | POA: Insufficient documentation

## 2019-03-10 DIAGNOSIS — F17210 Nicotine dependence, cigarettes, uncomplicated: Secondary | ICD-10-CM | POA: Insufficient documentation

## 2019-03-10 DIAGNOSIS — I10 Essential (primary) hypertension: Secondary | ICD-10-CM | POA: Insufficient documentation

## 2019-03-10 LAB — CBC
HCT: 37.3 % (ref 36.0–46.0)
Hemoglobin: 12 g/dL (ref 12.0–15.0)
MCH: 29.6 pg (ref 26.0–34.0)
MCHC: 32.2 g/dL (ref 30.0–36.0)
MCV: 91.9 fL (ref 80.0–100.0)
Platelets: 336 10*3/uL (ref 150–400)
RBC: 4.06 MIL/uL (ref 3.87–5.11)
RDW: 14 % (ref 11.5–15.5)
WBC: 9.4 10*3/uL (ref 4.0–10.5)
nRBC: 0 % (ref 0.0–0.2)

## 2019-03-10 LAB — BASIC METABOLIC PANEL
Anion gap: 13 (ref 5–15)
BUN: 6 mg/dL (ref 6–20)
CO2: 23 mmol/L (ref 22–32)
Calcium: 9.4 mg/dL (ref 8.9–10.3)
Chloride: 103 mmol/L (ref 98–111)
Creatinine, Ser: 0.92 mg/dL (ref 0.44–1.00)
GFR calc Af Amer: >60 mL/min (ref >60)
GFR calc non Af Amer: >60 mL/min (ref >60)
Glucose, Bld: 103 mg/dL — ABNORMAL HIGH (ref 70–99)
Potassium: 3.3 mmol/L — ABNORMAL LOW (ref 3.5–5.1)
Sodium: 139 mmol/L (ref 135–145)

## 2019-03-10 MED ORDER — LACTATED RINGERS IV BOLUS
1000.0000 mL | Freq: Once | INTRAVENOUS | Status: AC
  Administered 2019-03-10: 23:00:00 1000 mL via INTRAVENOUS

## 2019-03-10 MED ORDER — LACTATED RINGERS IV BOLUS
1000.0000 mL | Freq: Once | INTRAVENOUS | Status: AC
  Administered 2019-03-10: 20:00:00 1000 mL via INTRAVENOUS

## 2019-03-10 MED ORDER — KETAMINE HCL 10 MG/ML IJ SOLN
0.3000 mg/kg | Freq: Once | INTRAMUSCULAR | Status: AC
  Administered 2019-03-10: 22:00:00 22 mg via INTRAVENOUS
  Filled 2019-03-10: qty 1

## 2019-03-10 MED ORDER — PROMETHAZINE HCL 25 MG RE SUPP: 25.0000 mg | Freq: Four times a day (QID) | RECTAL | 0 refills | Status: DC | PRN

## 2019-03-10 MED ORDER — PROMETHAZINE HCL 25 MG/ML IJ SOLN
25.0000 mg | Freq: Once | INTRAMUSCULAR | Status: AC
  Administered 2019-03-10: 25 mg via INTRAVENOUS
  Filled 2019-03-10: qty 1

## 2019-03-10 MED ORDER — LORAZEPAM 2 MG/ML IJ SOLN
1.0000 mg | Freq: Once | INTRAMUSCULAR | Status: AC
  Administered 2019-03-10: 1 mg via INTRAVENOUS
  Filled 2019-03-10: qty 1

## 2019-03-10 MED ORDER — KETAMINE HCL 10 MG/ML IJ SOLN
0.3000 mg/kg | Freq: Once | INTRAMUSCULAR | Status: AC
  Administered 2019-03-10: 22 mg via INTRAVENOUS
  Filled 2019-03-10: qty 1

## 2019-03-10 NOTE — ED Notes (Signed)
ED Provider at bedside. 

## 2019-03-10 NOTE — ED Notes (Signed)
Patient reports she feels better and is ready to go home.  Dr Dina Rich aware.

## 2019-03-10 NOTE — ED Provider Notes (Signed)
[facility] DEPARTMENT Provider Note   CSN: [id number] Arrival date & time: 03/10/19  1517     History   Chief Complaint Chief Complaint  Patient presents with  . Vomiting    HPI Sherry Key is a 33 y.o. female.     Patient with history of cyclical vomiting syndrome, endometriosis, Crohn's disease, frequent emergency department visits and admissions, most recently seen at [facility], and here yesterday --re-presents with recurrent nausea, vomiting, diarrhea, abdominal pain, chest pain starting earlier this afternoon before she went to work.  Patient was treated with IV fluids, ketamine, Phenergan yesterday.  She did improve.  No recent fevers.  Patient has recurrent UTIs, last treated for UTI 1 month ago.  Denies any current urinary symptoms.  States that she has not used marijuana in the past month.  Reviewed patient's last CT and care everywhere from 12/18/2018:  IMPRESSION: 1. Stable subcentimeter mass arising in the tail of the pancreas. Suspect small pancreatic pseudocyst. If further assessment of this small pancreatic lesion is felt to be advisable, nonemergent MR pancreas pre and post-contrast would be advised to further assess. Pancreas otherwise appears normal and stable. 2. No bowel obstruction. No abscess in the abdomen or pelvis. No periappendiceal region inflammatory change. 3. No evident renal or ureteral calculus. No hydronephrosis. Urinary bladder wall thickness is within normal limits. 4. Small hiatal hernia.     Past Medical History:  Diagnosis Date  . Crohn's disease ([place])   . Cyclical vomiting   . Hypertension   . PCOS (polycystic ovarian syndrome)     There are no active problems to display for this patient.   Past Surgical History:  Procedure Laterality Date  . CESAREAN SECTION    . CESAREAN SECTION    . CHOLECYSTECTOMY    . HERNIA REPAIR    . TONSILLECTOMY    . TUBAL LIGATION    . TUBAL LIGATION       OB History   No obstetric history on file.      Home Medications    Prior to Admission medications   Medication Sig Start Date End Date Taking? Authorizing Provider  diazepam (VALIUM) 5 MG tablet Take 0.5 tablets (2.5 mg total) every 8 (eight) hours as needed by mouth for anxiety or muscle spasms (spasms). 05/25/17   Mesner, Corene Cornea, MD  DOXEPIN HCL PO Take 1 capsule at bedtime by mouth.    [provider]  flintstones complete (FLINTSTONES) 60 MG chewable tablet Chew 1 tablet by mouth daily.      [provider]  GABAPENTIN PO Take 1 capsule 3 (three) times daily by mouth.    [provider]  HYDROcodone-acetaminophen (NORCO/VICODIN) 5-325 MG tablet Take 1-2 tablets every 6 (six) hours as needed by mouth for severe pain. 05/25/17   Mesner, Corene Cornea, MD  HYDROmorphone HCl (DILAUDID PO) Take 100 mg by mouth 2 (two) times daily.    [provider]  ibuprofen (ADVIL,MOTRIN) 800 MG tablet Take 1 tablet (800 mg total) 3 (three) times daily by mouth. 05/25/17   Mesner, Corene Cornea, MD  LABETALOL HCL PO Take 1 tablet 2 (two) times daily by mouth.    [provider]  LORazepam (ATIVAN PO) Take 1 tablet as needed by mouth (for anxiety).    [provider]  promethazine (PHENERGAN) 25 MG tablet Take 1 tablet (25 mg total) by mouth every 6 (six) hours as needed for nausea or vomiting. 01/23/16   Gloriann Loan, PA-C  Promethazine  HCl (PHENERGAN PO) Take 1 tablet as needed by mouth (for nausea).    [provider]    Family History No family history on file.  Social History Social History   Tobacco Use  . Smoking status: Current Every Day Smoker    Types: Cigarettes  . Smokeless tobacco: Never Used  Substance Use Topics  . Alcohol use: No    Frequency: Never  . Drug use: No     Allergies   Percocet [oxycodone-acetaminophen], Zofran [ondansetron hcl], Other, Peanut-containing drug products, Percocet [oxycodone-acetaminophen], Tomato, and  Zofran   Review of Systems Review of Systems  Constitutional: Negative for fever.  HENT: Negative for rhinorrhea and sore throat.   Eyes: Negative for redness.  Respiratory: Negative for cough.   Cardiovascular: Positive for chest pain ("due to vomiting").  Gastrointestinal: Positive for abdominal pain, diarrhea, nausea and vomiting.  Genitourinary: Negative for dysuria.  Musculoskeletal: Negative for myalgias.  Skin: Negative for rash.  Neurological: Negative for headaches.     Physical Exam Updated Vital Signs BP (!) 166/107 (BP Location: Right Arm)   Pulse 98   Temp 98.4 F (36.9 C) (Oral)   Resp 20   LMP 02/24/2019   SpO2 100%   Physical Exam Vitals signs and nursing note reviewed.  Constitutional:      Appearance: She is well-developed.     Comments: Sitting in bed, bent over, clutching vomit bag.   HENT:     Head: Normocephalic and atraumatic.  Eyes:     General:        Right eye: No discharge.        Left eye: No discharge.     Conjunctiva/sclera: Conjunctivae normal.  Neck:     Musculoskeletal: Normal range of motion and neck supple.  Cardiovascular:     Rate and Rhythm: Normal rate and regular rhythm.     Heart sounds: Normal heart sounds.  Pulmonary:     Effort: Pulmonary effort is normal.     Breath sounds: Normal breath sounds.  Abdominal:     Palpations: Abdomen is soft.     Tenderness: There is abdominal tenderness (mild generalized).  Skin:    General: Skin is warm and dry.  Neurological:     Mental Status: She is alert.      ED Treatments / Results  Labs (all labs ordered are listed, but only abnormal results are displayed) Labs Reviewed  BASIC METABOLIC PANEL - Abnormal; Notable for the following components:      Result Value   Potassium 3.3 (*)    Glucose, Bld 103 (*)    All other components within normal limits  CBC    EKG None  Radiology No results found.  Procedures Procedures (including critical care time)   Medications Ordered in ED Medications  lactated ringers bolus 1,000 mL ( Intravenous Stopped 03/10/19 2126)  promethazine (PHENERGAN) injection 25 mg (25 mg Intravenous Given 03/10/19 2007)  ketamine (KETALAR) injection 22 mg (22 mg Intravenous Given 03/10/19 2007)  LORazepam (ATIVAN) injection 1 mg (1 mg Intravenous Given 03/10/19 2129)  ketamine (KETALAR) injection 22 mg (22 mg Intravenous Given 03/10/19 2222)     Initial Impression / Assessment and Plan / ED Course  I have reviewed the triage vital signs and the nursing notes.  Pertinent labs & imaging results that were available during my care of the patient were reviewed by me and considered in my medical decision making (see chart for details).  Patient seen and examined. Reviewed notes from yesterday and previous work-up at [facility]. Ketamine, phenergan, fluids ordered.   Vital signs reviewed and are as follows: BP (!) 185/134   Pulse (!) 106   Temp 98.4 F (36.9 C) (Oral)   Resp 20   LMP 02/24/2019   SpO2 98%   10:51 PM Patient with no improvement after initial treatment.   Ativan given. No improvement.   Additional dose of ketamine, no improvement.   Signout to Dr. Tamera Punt who will reassess, admit if needed.   Final Clinical Impressions(s) / ED Diagnoses   Final diagnoses:  None    ED Discharge Orders    None       Carlisle Cater, PA-C 03/10/19 2255    Malvin Johns, MD 03/10/19 2311

## 2019-03-10 NOTE — ED Notes (Signed)
Pt states she is still nauseated. No diarrhea or emesis since last note

## 2019-03-10 NOTE — ED Notes (Signed)
Visitor at the bedside.  Able to keep 477m's of water and 4879ms of juice without any c/o nausea.  Resting more comfortably at this time.

## 2019-03-10 NOTE — ED Notes (Signed)
Pt with small emesis. Urinated times 2 since arrival.

## 2019-03-10 NOTE — ED Triage Notes (Signed)
Pt c/o n/v/d-seen here yesterday for same-to triage in w/c-spitting in emesis bag-NAD

## 2019-03-10 NOTE — ED Notes (Signed)
No emesis. Pt states nausea "better".

## 2019-03-10 NOTE — ED Provider Notes (Signed)
Patient presents with vomiting and abdominal pain consistent with her prior episodes of cyclical vomiting syndrome.  She is feeling better after medications in the ED.  She just got her second dose of ketamine and says she feels better and does not feel like she wants to be admitted to the hospital.  She is currently doing a p.o. trial.  I will give her another bag of IV fluids given some ongoing tachycardia but her heart rate has improved.  If her heart rate improves and she is tolerating oral fluids, she can be discharged home with follow-up with her PCP.   Malvin Johns, MD 03/10/19 2303

## 2019-03-10 NOTE — ED Notes (Signed)
Provider aware of elevated b/p

## 2019-03-11 NOTE — ED Notes (Signed)
Pt discharged, states her ride cant come now. She is calling friends.

## 2019-03-11 NOTE — ED Notes (Signed)
Pt states she feels better, calling ride to go home.

## 2019-03-17 ENCOUNTER — Other Ambulatory Visit: Payer: Self-pay

## 2019-03-17 ENCOUNTER — Emergency Department (HOSPITAL_BASED_OUTPATIENT_CLINIC_OR_DEPARTMENT_OTHER)
Admission: EM | Admit: 2019-03-17 | Discharge: 2019-03-18 | Disposition: A | Discharge status: Home / Self Care | Payer: Medicaid Other | Attending: Emergency Medicine | Admitting: Emergency Medicine

## 2019-03-17 ENCOUNTER — Encounter (HOSPITAL_BASED_OUTPATIENT_CLINIC_OR_DEPARTMENT_OTHER): Payer: Self-pay

## 2019-03-17 DIAGNOSIS — Z91010 Allergy to peanuts: Secondary | ICD-10-CM | POA: Insufficient documentation

## 2019-03-17 DIAGNOSIS — I10 Essential (primary) hypertension: Secondary | ICD-10-CM | POA: Insufficient documentation

## 2019-03-17 DIAGNOSIS — R1013 Epigastric pain: Secondary | ICD-10-CM | POA: Diagnosis present

## 2019-03-17 DIAGNOSIS — A599 Trichomoniasis, unspecified: Secondary | ICD-10-CM | POA: Insufficient documentation

## 2019-03-17 DIAGNOSIS — R112 Nausea with vomiting, unspecified: Secondary | ICD-10-CM | POA: Insufficient documentation

## 2019-03-17 DIAGNOSIS — Z79899 Other long term (current) drug therapy: Secondary | ICD-10-CM | POA: Diagnosis not present

## 2019-03-17 DIAGNOSIS — F17210 Nicotine dependence, cigarettes, uncomplicated: Secondary | ICD-10-CM | POA: Diagnosis not present

## 2019-03-17 DIAGNOSIS — E876 Hypokalemia: Secondary | ICD-10-CM | POA: Insufficient documentation

## 2019-03-17 LAB — COMPREHENSIVE METABOLIC PANEL
ALT: 11 U/L (ref 0–44)
AST: 17 U/L (ref 15–41)
Albumin: 4.3 g/dL (ref 3.5–5.0)
Alkaline Phosphatase: 66 U/L (ref 38–126)
Anion gap: 11 (ref 5–15)
BUN: 6 mg/dL (ref 6–20)
CO2: 20 mmol/L — ABNORMAL LOW (ref 22–32)
Calcium: 9.3 mg/dL (ref 8.9–10.3)
Chloride: 107 mmol/L (ref 98–111)
Creatinine, Ser: 0.79 mg/dL (ref 0.44–1.00)
GFR calc Af Amer: >60 mL/min (ref >60)
GFR calc non Af Amer: >60 mL/min (ref >60)
Glucose, Bld: 115 mg/dL — ABNORMAL HIGH (ref 70–99)
Potassium: 3 mmol/L — ABNORMAL LOW (ref 3.5–5.1)
Sodium: 138 mmol/L (ref 135–145)
Total Bilirubin: 0.6 mg/dL (ref 0.3–1.2)
Total Protein: 7.7 g/dL (ref 6.5–8.1)

## 2019-03-17 LAB — URINALYSIS, ROUTINE W REFLEX MICROSCOPIC
Bilirubin Urine: NEGATIVE
Glucose, UA: NEGATIVE mg/dL
Ketones, ur: 15 mg/dL — AB
Nitrite: NEGATIVE
Protein, ur: NEGATIVE mg/dL
Specific Gravity, Urine: 1.025 (ref 1.005–1.030)
pH: 7 (ref 5.0–8.0)

## 2019-03-17 LAB — URINALYSIS, MICROSCOPIC (REFLEX)

## 2019-03-17 LAB — CBC
HCT: 36.7 % (ref 36.0–46.0)
Hemoglobin: 11.6 g/dL — ABNORMAL LOW (ref 12.0–15.0)
MCH: 29.4 pg (ref 26.0–34.0)
MCHC: 31.6 g/dL (ref 30.0–36.0)
MCV: 92.9 fL (ref 80.0–100.0)
Platelets: 301 10*3/uL (ref 150–400)
RBC: 3.95 MIL/uL (ref 3.87–5.11)
RDW: 14.3 % (ref 11.5–15.5)
WBC: 13.4 10*3/uL — ABNORMAL HIGH (ref 4.0–10.5)
nRBC: 0 % (ref 0.0–0.2)

## 2019-03-17 LAB — PREGNANCY, URINE: Preg Test, Ur: NEGATIVE

## 2019-03-17 LAB — LIPASE, BLOOD: Lipase: 25 U/L (ref 11–51)

## 2019-03-17 MED ORDER — PROMETHAZINE HCL 25 MG/ML IJ SOLN
12.5000 mg | Freq: Once | INTRAMUSCULAR | Status: AC
  Administered 2019-03-17: 21:00:00 12.5 mg via INTRAVENOUS
  Filled 2019-03-17: qty 1

## 2019-03-17 MED ORDER — SODIUM CHLORIDE 0.9 % IV BOLUS
1000.0000 mL | Freq: Once | INTRAVENOUS | Status: AC
  Administered 2019-03-17: 21:00:00 1000 mL via INTRAVENOUS

## 2019-03-17 NOTE — ED Triage Notes (Signed)
Pt via EMS for 2 hours of vomiting and abdominal pain.

## 2019-03-17 NOTE — ED Notes (Signed)
Attempted iv access x2 with no success, asked second RN to attempt.

## 2019-03-18 MED ORDER — POTASSIUM CHLORIDE CRYS ER 20 MEQ PO TBCR: 20.0000 meq | EXTENDED_RELEASE_TABLET | Freq: Two times a day (BID) | ORAL | 0 refills | Status: DC

## 2019-03-18 MED ORDER — POTASSIUM CHLORIDE 10 MEQ/100ML IV SOLN
10.0000 meq | Freq: Once | INTRAVENOUS | Status: AC
  Administered 2019-03-18: 10 meq via INTRAVENOUS
  Filled 2019-03-18: qty 100

## 2019-03-18 MED ORDER — PROMETHAZINE HCL 25 MG PO TABS: 25.0000 mg | ORAL_TABLET | Freq: Four times a day (QID) | ORAL | 0 refills | Status: DC | PRN

## 2019-03-18 MED ORDER — POTASSIUM CHLORIDE CRYS ER 20 MEQ PO TBCR
40.0000 meq | EXTENDED_RELEASE_TABLET | Freq: Once | ORAL | Status: AC
  Administered 2019-03-18: 40 meq via ORAL
  Filled 2019-03-18: qty 2

## 2019-03-18 MED ORDER — METRONIDAZOLE 500 MG PO TABS: 500.0000 mg | ORAL_TABLET | Freq: Two times a day (BID) | ORAL | 0 refills | Status: DC

## 2019-03-18 MED ORDER — METRONIDAZOLE 500 MG PO TABS
500.0000 mg | ORAL_TABLET | Freq: Once | ORAL | Status: AC
  Administered 2019-03-18: 500 mg via ORAL
  Filled 2019-03-18: qty 1

## 2019-03-18 MED ORDER — SODIUM CHLORIDE 0.9 % IV SOLN
1.0000 g | Freq: Once | INTRAVENOUS | Status: DC
  Filled 2019-03-18: qty 10

## 2019-03-18 MED ORDER — HYDROMORPHONE HCL 1 MG/ML IJ SOLN
1.0000 mg | Freq: Once | INTRAMUSCULAR | Status: AC
  Administered 2019-03-18: 1 mg via INTRAVENOUS
  Filled 2019-03-18: qty 1

## 2019-03-18 MED ORDER — KETAMINE HCL 10 MG/ML IJ SOLN
0.3000 mg/kg | Freq: Once | INTRAMUSCULAR | Status: AC
  Administered 2019-03-18: 22 mg via INTRAVENOUS
  Filled 2019-03-18: qty 1

## 2019-03-18 MED ORDER — PROMETHAZINE HCL 25 MG/ML IJ SOLN
25.0000 mg | Freq: Once | INTRAMUSCULAR | Status: AC
  Administered 2019-03-18: 01:00:00 25 mg via INTRAVENOUS
  Filled 2019-03-18: qty 1

## 2019-03-18 MED ORDER — SODIUM CHLORIDE 0.9 % IV BOLUS
1000.0000 mL | Freq: Once | INTRAVENOUS | Status: AC
  Administered 2019-03-18: 1000 mL via INTRAVENOUS

## 2019-03-18 NOTE — Discharge Instructions (Addendum)
Do not have sex until you and your partner have completed the treatment for trichomonas.  Do not drink anything with alcohol in it while taking metronidazole.

## 2019-03-18 NOTE — ED Provider Notes (Signed)
[facility] EMERGENCY DEPARTMENT Provider Note   CSN: [id number] Arrival date & time: 03/17/19  1954    History   Chief Complaint Chief Complaint  Patient presents with  . Emesis    HPI Sherry Key is a 33 y.o. female.   The history is provided by the patient.  Emesis She has history of hypertension, Crohn's disease, polycystic ovarian syndrome, cyclic vomiting syndrome and comes in with abdominal pain and vomiting all day.  She states that vomiting usually coincides with the onset of her menses and menses started today.  She is complaining of severe epigastric pain which she rates at 10/10.  She is vomited numerous times.  She has taken promethazine suppository without benefit.  Symptoms are typical of her flareups of cyclic vomiting.  Of note, she denies marijuana use.  She states that she usually gets relief with ketamine and promethazine.  Past Medical History:  Diagnosis Date  . Crohn's disease ([place])   . Cyclical vomiting   . Hypertension   . PCOS (polycystic ovarian syndrome)     There are no active problems to display for this patient.   Past Surgical History:  Procedure Laterality Date  . CESAREAN SECTION    . CESAREAN SECTION    . CHOLECYSTECTOMY    . HERNIA REPAIR    . TONSILLECTOMY    . TUBAL LIGATION    . TUBAL LIGATION       OB History   No obstetric history on file.      Home Medications    Prior to Admission medications   Medication Sig Start Date End Date Taking? Authorizing Provider  diazepam (VALIUM) 5 MG tablet Take 0.5 tablets (2.5 mg total) every 8 (eight) hours as needed by mouth for anxiety or muscle spasms (spasms). 05/25/17   Mesner, Corene Cornea, MD  DOXEPIN HCL PO Take 1 capsule at bedtime by mouth.    [provider]  flintstones complete (FLINTSTONES) 60 MG chewable tablet Chew 1 tablet by mouth daily.      [provider]  GABAPENTIN PO Take 1 capsule 3 (three) times daily by mouth.    [provider]  HYDROcodone-acetaminophen (NORCO/VICODIN) 5-325 MG tablet Take 1-2 tablets every 6 (six) hours as needed by mouth for severe pain. 05/25/17   Mesner, Corene Cornea, MD  HYDROmorphone HCl (DILAUDID PO) Take 100 mg by mouth 2 (two) times daily.    [provider]  ibuprofen (ADVIL,MOTRIN) 800 MG tablet Take 1 tablet (800 mg total) 3 (three) times daily by mouth. 05/25/17   Mesner, Corene Cornea, MD  LABETALOL HCL PO Take 1 tablet 2 (two) times daily by mouth.    [provider]  LORazepam (ATIVAN PO) Take 1 tablet as needed by mouth (for anxiety).    [provider]  promethazine (PHENERGAN) 25 MG suppository Place 1 suppository (25 mg total) rectally every 6 (six) hours as needed for nausea or vomiting. 03/10/19   Malvin Johns, MD    Family History History reviewed. No pertinent family history.  Social History Social History   Tobacco Use  . Smoking status: Current Every Day Smoker    Types: Cigarettes  . Smokeless tobacco: Never Used  Substance Use Topics  . Alcohol use: No    Frequency: Never  . Drug use: No     Allergies   Dicyclomine, Morphine, Percocet [oxycodone-acetaminophen], Zofran [ondansetron hcl], Other, Peanut-containing drug products, Percocet [oxycodone-acetaminophen], Tomato, Zofran, and Haloperidol   Review of Systems Review of Systems  Gastrointestinal: Positive for vomiting.  All other systems reviewed and are negative.    Physical Exam Updated Vital Signs BP (!) 179/128   Pulse 98   Temp 98.2 F (36.8 C) (Oral)   Resp (!) 22   Ht 5' 3"  (1.6 m)   Wt 72.6 kg   LMP 03/16/2019 (Exact Date)   SpO2 98%   BMI 28.34 kg/m   Physical Exam Vitals signs and nursing note reviewed.    33 year old female, appears uncomfortable and is intermittently retching, but is in no acute distress. Vital signs are significant for elevated blood pressure and respiratory rate. Oxygen saturation is 100%, which is normal. Head is normocephalic  and atraumatic. PERRLA, EOMI. Oropharynx is clear. Neck is nontender and supple without adenopathy or JVD. Back is nontender and there is no CVA tenderness. Lungs are clear without rales, wheezes, or rhonchi. Chest is nontender. Heart has regular rate and rhythm without murmur. Abdomen is soft, flat, with moderate epigastric tenderness.  There is no rebound or guarding.  There are no masses or hepatosplenomegaly and peristalsis is hypoactive. Extremities have no cyanosis or edema, full range of motion is present. Skin is warm and dry without rash. Neurologic: Mental status is normal, cranial nerves are intact, there are no motor or sensory deficits.  ED Treatments / Results  Labs (all labs ordered are listed, but only abnormal results are displayed) Labs Reviewed  COMPREHENSIVE METABOLIC PANEL - Abnormal; Notable for the following components:      Result Value   Potassium 3.0 (*)    CO2 20 (*)    Glucose, Bld 115 (*)    All other components within normal limits  CBC - Abnormal; Notable for the following components:   WBC 13.4 (*)    Hemoglobin 11.6 (*)    All other components within normal limits  URINALYSIS, ROUTINE W REFLEX MICROSCOPIC - Abnormal; Notable for the following components:   APPearance HAZY (*)    Hgb urine dipstick TRACE (*)    Ketones, ur 15 (*)    Leukocytes,Ua MODERATE (*)    All other components within normal limits  URINALYSIS, MICROSCOPIC (REFLEX) - Abnormal; Notable for the following components:   Bacteria, UA MANY (*)    Trichomonas, UA PRESENT (*)    All other components within normal limits  URINE CULTURE  LIPASE, BLOOD  PREGNANCY, URINE    Procedures Procedures   Medications Ordered in ED Medications  metroNIDAZOLE (FLAGYL) tablet 500 mg (has no administration in time range)  potassium chloride SA (K-DUR) CR tablet 40 mEq (has no administration in time range)  promethazine (PHENERGAN) injection 12.5 mg (12.5 mg Intravenous Given 03/17/19 2059)   sodium chloride 0.9 % bolus 1,000 mL (0 mLs Intravenous Stopped 03/17/19 2205)  sodium chloride 0.9 % bolus 1,000 mL ( Intravenous Stopped 03/18/19 0142)  promethazine (PHENERGAN) injection 25 mg (25 mg Intravenous Given 03/18/19 0031)  ketamine (KETALAR) injection 22 mg (22 mg Intravenous Given 03/18/19 0031)  potassium chloride 10 mEq in 100 mL IVPB ( Intravenous Stopped 03/18/19 0133)  ketamine (KETALAR) injection 22 mg (22 mg Intravenous Given 03/18/19 0218)  HYDROmorphone (DILAUDID) injection 1 mg (1 mg Intravenous Given 03/18/19 0311)     Initial Impression / Assessment and Plan / ED Course  I have reviewed the triage vital signs and the nursing notes.  Pertinent lab results that were available during my care of the patient were reviewed by me and considered in my medical decision making (see chart for  details).  Exacerbation of cyclic vomiting syndrome.  Old records are reviewed, and she has multiple ED visits and hospitalizations for same.  She was last seen August 30 and 31 for same complaints.  She will be given IV fluids, promethazine, ketamine.  Labs today show hypokalemia secondary to emesis, mild anemia.  Urine is a contaminated specimen and shows presence of trichomonas.  She required a second dose of ketamine but still continued to have abdominal pain.  She was given a dose of hydromorphone with complete relief of symptoms.  She had been given intravenous potassium, is given a dose of oral potassium as well as metronidazole and is sent home with prescriptions for K-Dur and metronidazole.  Follow-up with PCP.  Return precautions discussed.  Final Clinical Impressions(s) / ED Diagnoses   Final diagnoses:  Non-intractable vomiting with nausea, unspecified vomiting type  Hypokalemia  Trichomonas vaginalis infection  Elevated blood pressure reading with diagnosis of hypertension    ED Discharge Orders         Ordered    potassium chloride SA (K-DUR) 20 MEQ tablet  2 times daily      03/18/19 0345    metroNIDAZOLE (FLAGYL) 500 MG tablet  2 times daily     03/18/19 8315           [name], MD 17/61/60 [phone]

## 2019-03-29 ENCOUNTER — Emergency Department (HOSPITAL_BASED_OUTPATIENT_CLINIC_OR_DEPARTMENT_OTHER)
Admission: EM | Admit: 2019-03-29 | Discharge: 2019-03-29 | Disposition: A | Discharge status: Home / Self Care | Payer: Medicaid Other | Attending: Emergency Medicine | Admitting: Emergency Medicine

## 2019-03-29 ENCOUNTER — Encounter (HOSPITAL_BASED_OUTPATIENT_CLINIC_OR_DEPARTMENT_OTHER): Payer: Self-pay | Admitting: *Deleted

## 2019-03-29 ENCOUNTER — Other Ambulatory Visit: Payer: Self-pay

## 2019-03-29 DIAGNOSIS — Z885 Allergy status to narcotic agent status: Secondary | ICD-10-CM | POA: Insufficient documentation

## 2019-03-29 DIAGNOSIS — R112 Nausea with vomiting, unspecified: Secondary | ICD-10-CM | POA: Insufficient documentation

## 2019-03-29 DIAGNOSIS — Z91010 Allergy to peanuts: Secondary | ICD-10-CM | POA: Insufficient documentation

## 2019-03-29 DIAGNOSIS — F17210 Nicotine dependence, cigarettes, uncomplicated: Secondary | ICD-10-CM | POA: Diagnosis not present

## 2019-03-29 DIAGNOSIS — R1084 Generalized abdominal pain: Secondary | ICD-10-CM | POA: Insufficient documentation

## 2019-03-29 DIAGNOSIS — Z888 Allergy status to other drugs, medicaments and biological substances status: Secondary | ICD-10-CM | POA: Diagnosis not present

## 2019-03-29 DIAGNOSIS — Z886 Allergy status to analgesic agent status: Secondary | ICD-10-CM | POA: Diagnosis not present

## 2019-03-29 DIAGNOSIS — Z79899 Other long term (current) drug therapy: Secondary | ICD-10-CM | POA: Insufficient documentation

## 2019-03-29 DIAGNOSIS — I10 Essential (primary) hypertension: Secondary | ICD-10-CM | POA: Diagnosis not present

## 2019-03-29 HISTORY — DX: Endometriosis, unspecified: N80.9

## 2019-03-29 HISTORY — DX: Benign neoplasm of connective and other soft tissue, unspecified: D21.9

## 2019-03-29 LAB — COMPREHENSIVE METABOLIC PANEL
ALT: 10 U/L (ref 0–44)
AST: 17 U/L (ref 15–41)
Albumin: 4.4 g/dL (ref 3.5–5.0)
Alkaline Phosphatase: 72 U/L (ref 38–126)
Anion gap: 13 (ref 5–15)
BUN: 7 mg/dL (ref 6–20)
CO2: 20 mmol/L — ABNORMAL LOW (ref 22–32)
Calcium: 9.4 mg/dL (ref 8.9–10.3)
Chloride: 105 mmol/L (ref 98–111)
Creatinine, Ser: 0.71 mg/dL (ref 0.44–1.00)
GFR calc Af Amer: >60 mL/min (ref >60)
GFR calc non Af Amer: >60 mL/min (ref >60)
Glucose, Bld: 111 mg/dL — ABNORMAL HIGH (ref 70–99)
Potassium: 3.2 mmol/L — ABNORMAL LOW (ref 3.5–5.1)
Sodium: 138 mmol/L (ref 135–145)
Total Bilirubin: 0.6 mg/dL (ref 0.3–1.2)
Total Protein: 7.9 g/dL (ref 6.5–8.1)

## 2019-03-29 LAB — LIPASE, BLOOD: Lipase: 28 U/L (ref 11–51)

## 2019-03-29 LAB — CBC WITH DIFFERENTIAL/PLATELET
Abs Immature Granulocytes: 0.05 10*3/uL (ref 0.00–0.07)
Basophils Absolute: 0 10*3/uL (ref 0.0–0.1)
Basophils Relative: 0 %
Eosinophils Absolute: 0 10*3/uL (ref 0.0–0.5)
Eosinophils Relative: 0 %
HCT: 38.8 % (ref 36.0–46.0)
Hemoglobin: 12.5 g/dL (ref 12.0–15.0)
Immature Granulocytes: 0 %
Lymphocytes Relative: 6 %
Lymphs Abs: 0.8 10*3/uL (ref 0.7–4.0)
MCH: 29.5 pg (ref 26.0–34.0)
MCHC: 32.2 g/dL (ref 30.0–36.0)
MCV: 91.5 fL (ref 80.0–100.0)
Monocytes Absolute: 0.6 10*3/uL (ref 0.1–1.0)
Monocytes Relative: 4 %
Neutro Abs: 12.7 10*3/uL — ABNORMAL HIGH (ref 1.7–7.7)
Neutrophils Relative %: 90 %
Platelets: 322 10*3/uL (ref 150–400)
RBC: 4.24 MIL/uL (ref 3.87–5.11)
RDW: 14.1 % (ref 11.5–15.5)
WBC: 14.2 10*3/uL — ABNORMAL HIGH (ref 4.0–10.5)
nRBC: 0 % (ref 0.0–0.2)

## 2019-03-29 LAB — URINALYSIS, ROUTINE W REFLEX MICROSCOPIC
Bilirubin Urine: NEGATIVE
Glucose, UA: NEGATIVE mg/dL
Ketones, ur: 40 mg/dL — AB
Leukocytes,Ua: NEGATIVE
Nitrite: NEGATIVE
Protein, ur: NEGATIVE mg/dL
Specific Gravity, Urine: 1.02 (ref 1.005–1.030)
pH: 7 (ref 5.0–8.0)

## 2019-03-29 LAB — URINALYSIS, MICROSCOPIC (REFLEX)

## 2019-03-29 LAB — PREGNANCY, URINE: Preg Test, Ur: NEGATIVE

## 2019-03-29 MED ORDER — LORAZEPAM 1 MG PO TABS: 1.0000 mg | ORAL_TABLET | Freq: Three times a day (TID) | ORAL | 0 refills | Status: DC | PRN

## 2019-03-29 MED ORDER — LORAZEPAM 1 MG PO TABS
ORAL_TABLET | ORAL | Status: AC
  Filled 2019-03-29: qty 1

## 2019-03-29 MED ORDER — PROMETHAZINE HCL 25 MG PO TABS
25.0000 mg | ORAL_TABLET | Freq: Once | ORAL | Status: AC
  Administered 2019-03-29: 25 mg via ORAL
  Filled 2019-03-29: qty 1

## 2019-03-29 MED ORDER — KETAMINE HCL 10 MG/ML IJ SOLN
0.3000 mg/kg | Freq: Once | INTRAMUSCULAR | Status: AC
  Administered 2019-03-29: 22 mg via INTRAVENOUS
  Filled 2019-03-29: qty 1

## 2019-03-29 MED ORDER — SODIUM CHLORIDE 0.9 % IV BOLUS
1000.0000 mL | Freq: Once | INTRAVENOUS | Status: AC
  Administered 2019-03-29: 1000 mL via INTRAVENOUS

## 2019-03-29 MED ORDER — LORAZEPAM 1 MG PO TABS
1.0000 mg | ORAL_TABLET | Freq: Once | ORAL | Status: AC
  Administered 2019-03-29: 1 mg via ORAL

## 2019-03-29 MED ORDER — PROMETHAZINE HCL 25 MG/ML IJ SOLN
25.0000 mg | Freq: Once | INTRAMUSCULAR | Status: AC
  Administered 2019-03-29: 19:00:00 25 mg via INTRAVENOUS
  Filled 2019-03-29: qty 1

## 2019-03-29 MED ORDER — HYDROMORPHONE HCL 1 MG/ML IJ SOLN
1.0000 mg | Freq: Once | INTRAMUSCULAR | Status: AC
  Administered 2019-03-29: 1 mg via INTRAVENOUS
  Filled 2019-03-29: qty 1

## 2019-03-29 NOTE — ED Provider Notes (Signed)
[facility] EMERGENCY DEPARTMENT Provider Note   CSN: [id number] Arrival date & time: 03/29/19  1730     History   Chief Complaint Chief Complaint  Patient presents with  . Emesis    HPI Sherry Key is a 33 y.o. female.     The history is provided by the patient and medical records. No language interpreter was used.  Emesis Severity:  Severe Duration:  4 days Timing:  Constant Quality:  Stomach contents Progression:  Worsening Chronicity:  Recurrent Recent urination:  Normal Relieved by:  Nothing Worsened by:  Nothing Ineffective treatments:  Antiemetics Associated symptoms: abdominal pain   Associated symptoms: no arthralgias, no chills, no cough, no diarrhea, no fever, no headaches, no sore throat and no URI   Risk factors: not pregnant     Past Medical History:  Diagnosis Date  . Crohn's disease ([place])   . Cyclical vomiting   . Endometriosis   . Fibroids   . Hypertension   . PCOS (polycystic ovarian syndrome)     There are no active problems to display for this patient.   Past Surgical History:  Procedure Laterality Date  . CESAREAN SECTION    . CESAREAN SECTION    . CHOLECYSTECTOMY    . HERNIA REPAIR    . TONSILLECTOMY    . TUBAL LIGATION    . TUBAL LIGATION       OB History   No obstetric history on file.      Home Medications    Prior to Admission medications   Medication Sig Start Date End Date Taking? Authorizing Provider  diazepam (VALIUM) 5 MG tablet Take 0.5 tablets (2.5 mg total) every 8 (eight) hours as needed by mouth for anxiety or muscle spasms (spasms). 05/25/17   Mesner, Corene Cornea, MD  DOXEPIN HCL PO Take 1 capsule at bedtime by mouth.    [provider]  flintstones complete (FLINTSTONES) 60 MG chewable tablet Chew 1 tablet by mouth daily.      [provider]  GABAPENTIN PO Take 1 capsule 3 (three) times daily by mouth.    [provider]  HYDROcodone-acetaminophen (NORCO/VICODIN)  5-325 MG tablet Take 1-2 tablets every 6 (six) hours as needed by mouth for severe pain. 05/25/17   Mesner, Corene Cornea, MD  HYDROmorphone HCl (DILAUDID PO) Take 100 mg by mouth 2 (two) times daily.    [provider]  ibuprofen (ADVIL,MOTRIN) 800 MG tablet Take 1 tablet (800 mg total) 3 (three) times daily by mouth. 05/25/17   Mesner, Corene Cornea, MD  LABETALOL HCL PO Take 1 tablet 2 (two) times daily by mouth.    [provider]  LORazepam (ATIVAN PO) Take 1 tablet as needed by mouth (for anxiety).    [provider]  metroNIDAZOLE (FLAGYL) 500 MG tablet Take 1 tablet (500 mg total) by mouth 2 (two) times daily. 0/3/00   [name], MD  potassium chloride SA (K-DUR) 20 MEQ tablet Take 1 tablet (20 mEq total) by mouth 2 (two) times daily. 03/11/32   [name], MD  promethazine (PHENERGAN) 25 MG suppository Place 1 suppository (25 mg total) rectally every 6 (six) hours as needed for nausea or vomiting. 03/10/19   Malvin Johns, MD  promethazine (PHENERGAN) 25 MG tablet Take 1 tablet (25 mg total) by mouth every 6 (six) hours as needed for nausea or vomiting. 0/0/76   [name], MD    Family History No family history on file.  Social History Social History  Tobacco Use  . Smoking status: Current Every Day Smoker    Types: Cigarettes  . Smokeless tobacco: Never Used  Substance Use Topics  . Alcohol use: No    Frequency: Never  . Drug use: No     Allergies   Dicyclomine, Morphine, Percocet [oxycodone-acetaminophen], Zofran [ondansetron hcl], Other, Peanut-containing drug products, Percocet [oxycodone-acetaminophen], Tomato, Zofran, and Haloperidol   Review of Systems Review of Systems  Constitutional: Negative for chills, diaphoresis, fatigue and fever.  HENT: Negative for congestion and sore throat.   Eyes: Negative for visual disturbance.  Respiratory: Negative for cough, chest tightness, shortness of breath and wheezing.   Gastrointestinal: Positive for  abdominal pain, nausea and vomiting. Negative for constipation and diarrhea.  Genitourinary: Negative for dysuria and flank pain.  Musculoskeletal: Negative for arthralgias, back pain, neck pain and neck stiffness.  Neurological: Negative for light-headedness and headaches.  All other systems reviewed and are negative.    Physical Exam Updated Vital Signs BP (!) 164/113 (BP Location: Left Arm)   Pulse (!) 115   Temp 98.6 F (37 C) (Oral)   Resp 18   Ht 5' 3"  (1.6 m)   Wt 72.6 kg   LMP 03/16/2019 (Exact Date)   SpO2 98%   BMI 28.34 kg/m   Physical Exam Vitals signs and nursing note reviewed.  Constitutional:      General: She is not in acute distress.    Appearance: She is well-developed. She is not ill-appearing, toxic-appearing or diaphoretic.  HENT:     Head: Normocephalic and atraumatic.     Right Ear: External ear normal.     Left Ear: External ear normal.     Nose: Nose normal. No congestion or rhinorrhea.     Mouth/Throat:     Pharynx: No oropharyngeal exudate.  Eyes:     Conjunctiva/sclera: Conjunctivae normal.     Pupils: Pupils are equal, round, and reactive to light.  Neck:     Musculoskeletal: Normal range of motion and neck supple. No muscular tenderness.  Cardiovascular:     Rate and Rhythm: Regular rhythm. Tachycardia present.     Pulses: Normal pulses.  Pulmonary:     Effort: Pulmonary effort is normal. No respiratory distress.     Breath sounds: No stridor. No wheezing, rhonchi or rales.  Chest:     Chest wall: No tenderness.  Abdominal:     General: Abdomen is flat. There is no distension.     Tenderness: There is abdominal tenderness. There is no right CVA tenderness, left CVA tenderness, guarding or rebound.  Musculoskeletal:        General: No tenderness.  Skin:    General: Skin is warm.     Capillary Refill: Capillary refill takes less than 2 seconds.     Findings: No erythema or rash.  Neurological:     General: No focal deficit present.      Mental Status: She is alert.     Motor: No abnormal muscle tone.     Deep Tendon Reflexes: Reflexes are normal and symmetric.  Psychiatric:        Mood and Affect: Mood normal.      ED Treatments / Results  Labs (all labs ordered are listed, but only abnormal results are displayed) Labs Reviewed  CBC WITH DIFFERENTIAL/PLATELET - Abnormal; Notable for the following components:      Result Value   WBC 14.2 (*)    Neutro Abs 12.7 (*)    All other components within normal  limits  COMPREHENSIVE METABOLIC PANEL - Abnormal; Notable for the following components:   Potassium 3.2 (*)    CO2 20 (*)    Glucose, Bld 111 (*)    All other components within normal limits  URINALYSIS, ROUTINE W REFLEX MICROSCOPIC - Abnormal; Notable for the following components:   APPearance HAZY (*)    Hgb urine dipstick SMALL (*)    Ketones, ur 40 (*)    All other components within normal limits  URINALYSIS, MICROSCOPIC (REFLEX) - Abnormal; Notable for the following components:   Bacteria, UA MANY (*)    All other components within normal limits  URINE CULTURE  LIPASE, BLOOD  PREGNANCY, URINE    EKG None  Radiology No results found.  Procedures Procedures (including critical care time)  Medications Ordered in ED Medications  LORazepam (ATIVAN) 1 MG tablet (has no administration in time range)  ketamine (KETALAR) injection 22 mg (22 mg Intravenous Given 03/29/19 1923)  promethazine (PHENERGAN) injection 25 mg (25 mg Intravenous Given 03/29/19 1925)  sodium chloride 0.9 % bolus 1,000 mL (0 mLs Intravenous Stopped 03/29/19 2142)  HYDROmorphone (DILAUDID) injection 1 mg (1 mg Intravenous Given 03/29/19 1955)  promethazine (PHENERGAN) tablet 25 mg (25 mg Oral Given 03/29/19 2149)  LORazepam (ATIVAN) tablet 1 mg (1 mg Oral Given 03/29/19 2312)     Initial Impression / Assessment and Plan / ED Course  I have reviewed the triage vital signs and the nursing notes.  Pertinent labs & imaging results  that were available during my care of the patient were reviewed by me and considered in my medical decision making (see chart for details).        CALLEE ROHRIG is a 33 y.o. female with a past medical history significant for Crohn's, endometriosis, PCOS, fibroids, hypertension, and cyclical vomiting syndrome who presents with nausea, vomiting, and abdominal pain.  Patient reports that it is her menstrual cycle and she is having severe pain consistent with endometriosis flare.  She reports that she "wants her uterus taken out".  She says that pain is been ongoing for the last few days and it feels similar to the past.  She reports nonstop nausea and vomiting and is not maintaining hydration.  She describes the pain as 10 out of 10.  She is tachycardic on arrival.  She reports no constipation or diarrhea and no rectal bleeding.  She denies any pelvic pain.  She denies any fevers, chills, chest pain, congestion, cough.  She denies other complaints and denies trauma.  On exam, abdomen is tender to palpation.  Patient is actively vomiting with no blood in her emesis seen.  Lungs clear and chest is nontender.  Back is nontender.  Flanks nontender.  Clinically I suspect patient is having pain with her endometriosis similar to prior episodes that is complicated by cyclical vomiting.  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  No obstruction or abscess or appendix trouble was seen on previous CT imaging.  Given her similar symptoms to prior, patient and I had a discussion about management.  We will give the patient pain medicine and nausea medicine which in her case has been ketamine and Phenergan as well as fluids.  We will check labs and urine given the several days of vomiting and decreased p.o. intake to make sure she is not significantly dehydrated or having electrolyte imbalance.  If pain does not improve, will consider  repeat imaging.  Patient is agreeable to this plan initially.  Laboratory testing was overall reassuring.  Patient has an ketones in the urine consistent with dehydration.  She was given fluids.  Urine showed no nitrites or leukocytes, doubt infection.  Mild leukocytosis present likely from all the vomiting.  Potassium was slightly low but improved from prior.  Lipase not elevated.  After medications, patient is able to eat and drink.  She will call her OB/GYN team to discuss further management of her endometriosis and abdominal pains.  Given the improvement in symptoms, we have low suspicion that it is a Crohn's flare.  Patient agreed with not getting imaging at this time since she was feeling better.  Patient will follow-up with her OB/GYN and PCP.  She understood return precautions.  Treated with questions or concerns and was discharged in good condition after given prescription for Ativan to help with her nausea and vomiting.   Final Clinical Impressions(s) / ED Diagnoses   Final diagnoses:  Non-intractable vomiting with nausea, unspecified vomiting type  Generalized abdominal pain    ED Discharge Orders         Ordered    LORazepam (ATIVAN) 1 MG tablet  3 times daily PRN     03/29/19 2259          Clinical Impression: 1. Non-intractable vomiting with nausea, unspecified vomiting type   2. Generalized abdominal pain     Disposition: Discharge  Condition: Good  I have discussed the results, Dx and Tx plan with the pt(& family if present). He/she/they expressed understanding and agree(s) with the plan. Discharge instructions discussed at great length. Strict return precautions discussed and pt &/or family have verbalized understanding of the instructions. No further questions at time of discharge.    New Prescriptions   LORAZEPAM (ATIVAN) 1 MG TABLET    Take 1 tablet (1 mg total) by mouth 3 (three) times daily as needed (vomiting).    Follow Up: [facility], [address] [place] [address] [phone]     [place] [phone] Schedule an appointment as soon as possible for a visit       Zacariah Belue, Gwenyth Allegra, MD 03/29/19 2314

## 2019-03-29 NOTE — ED Notes (Signed)
Pt pulled out IV accidentally. Continuing to vomit. Notified EDP.

## 2019-03-29 NOTE — ED Notes (Signed)
Pt called out requesting nausea medications, states she is feeling "Miserable." EDP notified. No new orders at this time. Will continue to monitor.

## 2019-03-29 NOTE — ED Notes (Signed)
Pt informed of need for urine sample but states she can't go at this time. "i'm dehydrated"

## 2019-03-29 NOTE — ED Notes (Signed)
ED Provider at bedside. 

## 2019-03-29 NOTE — ED Notes (Signed)
Attempted IV x 2

## 2019-03-29 NOTE — ED Notes (Signed)
Pt requests Dilaudid for pain; EDP made aware

## 2019-03-29 NOTE — ED Triage Notes (Signed)
Pt reports she has been on her period since the 8th of this month. Reports hx of fibroids. States "it feels like contractions". States she has vomited more than 20 times today.

## 2019-03-29 NOTE — Discharge Instructions (Signed)
Your history and exam today are consistent with your endometriosis flaring up during her menstrual cycle causing your pain and exacerbating your vomiting syndrome.  We discussed the possibility of getting CT imaging however as you already had 2 CT scans this year and the symptoms are similar to prior episodes, we agreed to hold on the imaging at this time.  As we were able to help your symptoms with medications, and you were able to tolerate some oral intake, we feel you are safe for discharge home with the prescription for Ativan to help with the vomiting.  Please call to follow-up with your OB/GYN to discuss further management.  Please follow-up with your primary doctor.  Please try and stay hydrated and rest.

## 2019-03-31 LAB — URINE CULTURE

## 2019-04-03 ENCOUNTER — Other Ambulatory Visit: Payer: Self-pay

## 2019-04-03 ENCOUNTER — Emergency Department (HOSPITAL_BASED_OUTPATIENT_CLINIC_OR_DEPARTMENT_OTHER)
Admission: EM | Admit: 2019-04-03 | Discharge: 2019-04-03 | Disposition: A | Discharge status: Home / Self Care | Payer: Medicaid Other | Attending: Emergency Medicine | Admitting: Emergency Medicine

## 2019-04-03 ENCOUNTER — Encounter (HOSPITAL_BASED_OUTPATIENT_CLINIC_OR_DEPARTMENT_OTHER): Payer: Self-pay | Admitting: *Deleted

## 2019-04-03 DIAGNOSIS — Z79899 Other long term (current) drug therapy: Secondary | ICD-10-CM | POA: Diagnosis not present

## 2019-04-03 DIAGNOSIS — R1115 Cyclical vomiting syndrome unrelated to migraine: Secondary | ICD-10-CM | POA: Diagnosis not present

## 2019-04-03 DIAGNOSIS — F17210 Nicotine dependence, cigarettes, uncomplicated: Secondary | ICD-10-CM | POA: Insufficient documentation

## 2019-04-03 DIAGNOSIS — I10 Essential (primary) hypertension: Secondary | ICD-10-CM | POA: Insufficient documentation

## 2019-04-03 DIAGNOSIS — R112 Nausea with vomiting, unspecified: Secondary | ICD-10-CM | POA: Diagnosis present

## 2019-04-03 LAB — CBC WITH DIFFERENTIAL/PLATELET
Abs Immature Granulocytes: 0.15 10*3/uL — ABNORMAL HIGH (ref 0.00–0.07)
Basophils Absolute: 0.1 10*3/uL (ref 0.0–0.1)
Basophils Relative: 0 %
Eosinophils Absolute: 0 10*3/uL (ref 0.0–0.5)
Eosinophils Relative: 0 %
HCT: 39.6 % (ref 36.0–46.0)
Hemoglobin: 12.5 g/dL (ref 12.0–15.0)
Immature Granulocytes: 1 %
Lymphocytes Relative: 2 %
Lymphs Abs: 0.5 10*3/uL — ABNORMAL LOW (ref 0.7–4.0)
MCH: 29.1 pg (ref 26.0–34.0)
MCHC: 31.6 g/dL (ref 30.0–36.0)
MCV: 92.3 fL (ref 80.0–100.0)
Monocytes Absolute: 1.3 10*3/uL — ABNORMAL HIGH (ref 0.1–1.0)
Monocytes Relative: 5 %
Neutro Abs: 24.1 10*3/uL — ABNORMAL HIGH (ref 1.7–7.7)
Neutrophils Relative %: 92 %
Platelets: 301 10*3/uL (ref 150–400)
RBC: 4.29 MIL/uL (ref 3.87–5.11)
RDW: 14.5 % (ref 11.5–15.5)
Smear Review: NORMAL
WBC: 26.1 10*3/uL — ABNORMAL HIGH (ref 4.0–10.5)
nRBC: 0 % (ref 0.0–0.2)

## 2019-04-03 LAB — COMPREHENSIVE METABOLIC PANEL
ALT: 11 U/L (ref 0–44)
AST: 24 U/L (ref 15–41)
Albumin: 4.8 g/dL (ref 3.5–5.0)
Alkaline Phosphatase: 83 U/L (ref 38–126)
Anion gap: 12 (ref 5–15)
BUN: 8 mg/dL (ref 6–20)
CO2: 21 mmol/L — ABNORMAL LOW (ref 22–32)
Calcium: 9.9 mg/dL (ref 8.9–10.3)
Chloride: 105 mmol/L (ref 98–111)
Creatinine, Ser: 0.66 mg/dL (ref 0.44–1.00)
GFR calc Af Amer: >60 mL/min (ref >60)
GFR calc non Af Amer: >60 mL/min (ref >60)
Glucose, Bld: 122 mg/dL — ABNORMAL HIGH (ref 70–99)
Potassium: 3.9 mmol/L (ref 3.5–5.1)
Sodium: 138 mmol/L (ref 135–145)
Total Bilirubin: 0.8 mg/dL (ref 0.3–1.2)
Total Protein: 8.7 g/dL — ABNORMAL HIGH (ref 6.5–8.1)

## 2019-04-03 LAB — LIPASE, BLOOD: Lipase: 48 U/L (ref 11–51)

## 2019-04-03 LAB — URINALYSIS, ROUTINE W REFLEX MICROSCOPIC
Bilirubin Urine: NEGATIVE
Glucose, UA: NEGATIVE mg/dL
Ketones, ur: 15 mg/dL — AB
Leukocytes,Ua: NEGATIVE
Nitrite: NEGATIVE
Protein, ur: NEGATIVE mg/dL
Specific Gravity, Urine: 1.02 (ref 1.005–1.030)
pH: 7.5 (ref 5.0–8.0)

## 2019-04-03 LAB — PREGNANCY, URINE: Preg Test, Ur: NEGATIVE

## 2019-04-03 LAB — URINALYSIS, MICROSCOPIC (REFLEX)

## 2019-04-03 MED ORDER — PROMETHAZINE HCL 25 MG RE SUPP: 25.0000 mg | Freq: Four times a day (QID) | RECTAL | 0 refills | Status: DC | PRN

## 2019-04-03 MED ORDER — DROPERIDOL 2.5 MG/ML IJ SOLN
2.5000 mg | Freq: Once | INTRAMUSCULAR | Status: AC
  Administered 2019-04-03: 20:00:00 2.5 mg via INTRAMUSCULAR

## 2019-04-03 MED ORDER — HYDROMORPHONE HCL 1 MG/ML IJ SOLN
0.5000 mg | Freq: Once | INTRAMUSCULAR | Status: AC
  Administered 2019-04-03: 20:00:00 0.5 mg via INTRAVENOUS
  Filled 2019-04-03: qty 1

## 2019-04-03 MED ORDER — DIPHENHYDRAMINE HCL 50 MG/ML IJ SOLN
25.0000 mg | Freq: Once | INTRAMUSCULAR | Status: AC
  Administered 2019-04-03: 20:00:00 25 mg via INTRAVENOUS
  Filled 2019-04-03: qty 1

## 2019-04-03 MED ORDER — DROPERIDOL 2.5 MG/ML IJ SOLN
2.5000 mg | Freq: Once | INTRAMUSCULAR | Status: DC
  Filled 2019-04-03: qty 2

## 2019-04-03 MED ORDER — SODIUM CHLORIDE 0.9 % IV BOLUS
1000.0000 mL | Freq: Once | INTRAVENOUS | Status: AC
  Administered 2019-04-03: 20:00:00 1000 mL via INTRAVENOUS

## 2019-04-03 NOTE — Discharge Instructions (Addendum)
Try zantac or pepcid twice a day.  Try to avoid things that may make this worse, most commonly these are spicy foods tomato based products fatty foods chocolate and peppermint.  Alcohol and tobacco can also make this worse.  Return to the emergency department for sudden worsening pain fever or inability to eat or drink.  If you smoke marijuana this can sometimes make you vomit.  This may be a reason to stop if you do.

## 2019-04-03 NOTE — ED Notes (Signed)
Unable to obtain IV at this time. Another RN attempting with ultrasound.

## 2019-04-03 NOTE — ED Notes (Signed)
Ginger ale given per Dr Tyrone Nine

## 2019-04-03 NOTE — ED Provider Notes (Signed)
[facility] Provider Note   CSN: [id number] Arrival date & time: 04/03/19  1643     History   Chief Complaint Chief Complaint  Patient presents with  . Emesis    HPI Sherry Key is a 33 y.o. female.     33 yo F with a chief complaint of intractable nausea and vomiting.  Patient has a history of the same.  She thinks due to her endometriosis.  Describes diffuse abdominal cramping.  Nausea and vomiting for about a day though she says since she was seen in the ED about 4 days ago has had continued vomiting off and on.  The history is provided by the patient.  Emesis Severity:  Moderate Duration:  2 days Timing:  Constant Quality:  Stomach contents Progression:  Worsening Chronicity:  New Recent urination:  Normal Relieved by:  Nothing Worsened by:  Nothing Ineffective treatments:  None tried Associated symptoms: no arthralgias, no chills, no fever, no headaches and no myalgias     Past Medical History:  Diagnosis Date  . Crohn's disease ([place])   . Cyclical vomiting   . Endometriosis   . Fibroids   . Hypertension   . PCOS (polycystic ovarian syndrome)     There are no active problems to display for this patient.   Past Surgical History:  Procedure Laterality Date  . CESAREAN SECTION    . CESAREAN SECTION    . CHOLECYSTECTOMY    . HERNIA REPAIR    . TONSILLECTOMY    . TUBAL LIGATION    . TUBAL LIGATION       OB History   No obstetric history on file.      Home Medications    Prior to Admission medications   Medication Sig Start Date End Date Taking? Authorizing Provider  diazepam (VALIUM) 5 MG tablet Take 0.5 tablets (2.5 mg total) every 8 (eight) hours as needed by mouth for anxiety or muscle spasms (spasms). 05/25/17   Mesner, Corene Cornea, MD  DOXEPIN HCL PO Take 1 capsule at bedtime by mouth.    [provider]  flintstones complete (FLINTSTONES) 60 MG chewable tablet Chew 1 tablet by mouth daily.       [provider]  GABAPENTIN PO Take 1 capsule 3 (three) times daily by mouth.    [provider]  HYDROcodone-acetaminophen (NORCO/VICODIN) 5-325 MG tablet Take 1-2 tablets every 6 (six) hours as needed by mouth for severe pain. 05/25/17   Mesner, Corene Cornea, MD  HYDROmorphone HCl (DILAUDID PO) Take 100 mg by mouth 2 (two) times daily.    [provider]  ibuprofen (ADVIL,MOTRIN) 800 MG tablet Take 1 tablet (800 mg total) 3 (three) times daily by mouth. 05/25/17   Mesner, Corene Cornea, MD  LABETALOL HCL PO Take 1 tablet 2 (two) times daily by mouth.    [provider]  LORazepam (ATIVAN PO) Take 1 tablet as needed by mouth (for anxiety).    [provider]  LORazepam (ATIVAN) 1 MG tablet Take 1 tablet (1 mg total) by mouth 3 (three) times daily as needed (vomiting). 03/29/19   Tegeler, Gwenyth Allegra, MD  metroNIDAZOLE (FLAGYL) 500 MG tablet Take 1 tablet (500 mg total) by mouth 2 (two) times daily. 0/1/75   [name], MD  potassium chloride SA (K-DUR) 20 MEQ tablet Take 1 tablet (20 mEq total) by mouth 2 (two) times daily. 1/0/25   [name], MD  promethazine (PHENERGAN) 25 MG suppository Place 1 suppository (25 mg  total) rectally every 6 (six) hours as needed for nausea or vomiting. 04/03/19   Deno Etienne, DO    Family History History reviewed. No pertinent family history.  Social History Social History   Tobacco Use  . Smoking status: Current Every Day Smoker    Types: Cigarettes  . Smokeless tobacco: Never Used  Substance Use Topics  . Alcohol use: No    Frequency: Never  . Drug use: No     Allergies   Dicyclomine, Morphine, Percocet [oxycodone-acetaminophen], Zofran [ondansetron hcl], Other, Peanut-containing drug products, Percocet [oxycodone-acetaminophen], Tomato, Zofran, and Haloperidol   Review of Systems Review of Systems  Constitutional: Negative for chills and fever.  HENT: Negative for congestion and rhinorrhea.   Eyes: Negative  for redness and visual disturbance.  Respiratory: Negative for shortness of breath and wheezing.   Cardiovascular: Negative for chest pain and palpitations.  Gastrointestinal: Positive for constipation, nausea and vomiting.  Genitourinary: Negative for dysuria and urgency.  Musculoskeletal: Negative for arthralgias and myalgias.  Skin: Negative for pallor and wound.  Neurological: Negative for dizziness and headaches.     Physical Exam Updated Vital Signs BP 125/87 (BP Location: Left Arm)   Pulse 95   Temp 98.8 F (37.1 C)   Resp 15   Ht 5' 3"  (1.6 m)   Wt 70 kg   LMP 03/26/2019   SpO2 96%   BMI 27.34 kg/m   Physical Exam Vitals signs and nursing note reviewed.  Constitutional:      General: She is not in acute distress.    Appearance: She is well-developed. She is not diaphoretic.  HENT:     Head: Normocephalic and atraumatic.  Eyes:     Pupils: Pupils are equal, round, and reactive to light.  Neck:     Musculoskeletal: Normal range of motion and neck supple.  Cardiovascular:     Rate and Rhythm: Normal rate and regular rhythm.     Heart sounds: No murmur. No friction rub. No gallop.   Pulmonary:     Effort: Pulmonary effort is normal.     Breath sounds: No wheezing or rales.  Abdominal:     General: There is no distension.     Palpations: Abdomen is soft.     Tenderness: There is abdominal tenderness (mild diffuse).  Musculoskeletal:        General: No tenderness.  Skin:    General: Skin is warm and dry.  Neurological:     Mental Status: She is alert and oriented to person, place, and time.  Psychiatric:        Behavior: Behavior normal.      ED Treatments / Results  Labs (all labs ordered are listed, but only abnormal results are displayed) Labs Reviewed  URINALYSIS, ROUTINE W REFLEX MICROSCOPIC - Abnormal; Notable for the following components:      Result Value   APPearance HAZY (*)    Hgb urine dipstick TRACE (*)    Ketones, ur 15 (*)    All  other components within normal limits  CBC WITH DIFFERENTIAL/PLATELET - Abnormal; Notable for the following components:   WBC 26.1 (*)    Neutro Abs 24.1 (*)    Lymphs Abs 0.5 (*)    Monocytes Absolute 1.3 (*)    Abs Immature Granulocytes 0.15 (*)    All other components within normal limits  COMPREHENSIVE METABOLIC PANEL - Abnormal; Notable for the following components:   CO2 21 (*)    Glucose, Bld 122 (*)  Total Protein 8.7 (*)    All other components within normal limits  URINALYSIS, MICROSCOPIC (REFLEX) - Abnormal; Notable for the following components:   Bacteria, UA MANY (*)    All other components within normal limits  PREGNANCY, URINE  LIPASE, BLOOD    EKG None  Radiology No results found.  Procedures Procedures (including critical care time) Procedure note: Ultrasound Guided Peripheral IV Ultrasound guided peripheral 1.88 inch angiocath IV placement performed by me. Indications: Nursing unable to place IV. Details: The antecubital fossa and upper arm were evaluated with a multifrequency linear probe. Patent brachial veins were noted. 1 attempt was made to cannulate a vein under realtime US guidance with successful cannulation of the vein and catheter placement. There is return of non-pulsatile dark red blood. The patient tolerated the procedure well without complications. Images archived electronically.  CPT codes: [phone] and [phone]  EJ placement: 20 gauge IV placed in L EJ. Skin prepped with alcohol pads, L EJ identified with Valsalva. Cannulated with good return of dark, non-pulsatile blood. Tachyderm placed after easily flushed with NS.   Medications Ordered in ED Medications  sodium chloride 0.9 % bolus 1,000 mL (0 mLs Intravenous Stopped 04/03/19 2133)  diphenhydrAMINE (BENADRYL) injection 25 mg (25 mg Intravenous Given 04/03/19 2020)  droperidol (INAPSINE) 2.5 MG/ML injection 2.5 mg (2.5 mg Intramuscular Given 04/03/19 2005)  HYDROmorphone (DILAUDID) injection 0.5  mg (0.5 mg Intravenous Given 04/03/19 2025)     Initial Impression / Assessment and Plan / ED Course  I have reviewed the triage vital signs and the nursing notes.  Pertinent labs & imaging results that were available during my care of the patient were reviewed by me and considered in my medical decision making (see chart for details).        33 yo F with a chief complaints of diffuse abdominal pain nausea and vomiting. Hx of same. Will give fluids, antiemetics.  Reassess.   Patient sleeping on reassessment.  Was able to tolerate p.o.  UA is negative for infection.  She has a significant leukocytosis which appears she has had previously.  Lipase and LFTS unremarkable.  D/c home.   11:10 PM:  I have discussed the diagnosis/risks/treatment options with the patient and believe the pt to be eligible for discharge home to follow-up with PCP. We also discussed returning to the ED immediately if new or worsening sx occur. We discussed the sx which are most concerning (e.g., sudden worsening pain, fever, inability to tolerate by mouth) that necessitate immediate return. Medications administered to the patient during their visit and any new prescriptions provided to the patient are listed below.  Medications given during this visit Medications  sodium chloride 0.9 % bolus 1,000 mL (0 mLs Intravenous Stopped 04/03/19 2133)  diphenhydrAMINE (BENADRYL) injection 25 mg (25 mg Intravenous Given 04/03/19 2020)  droperidol (INAPSINE) 2.5 MG/ML injection 2.5 mg (2.5 mg Intramuscular Given 04/03/19 2005)  HYDROmorphone (DILAUDID) injection 0.5 mg (0.5 mg Intravenous Given 04/03/19 2025)     The patient appears reasonably screen and/or stabilized for discharge and I doubt any other medical condition or other [facility] requiring further screening, evaluation, or treatment in the ED at this time prior to discharge.    Final Clinical Impressions(s) / ED Diagnoses   Final diagnoses:  Cyclic vomiting syndrome     ED Discharge Orders         Ordered    promethazine (PHENERGAN) 25 MG suppository  Every 6 hours PRN     04/03/19 2254  Deno Etienne, DO 04/03/19 2310

## 2019-04-03 NOTE — ED Triage Notes (Signed)
Vomiting x 1 day  HX of same

## 2019-04-14 ENCOUNTER — Other Ambulatory Visit: Payer: Self-pay

## 2019-04-14 ENCOUNTER — Emergency Department (HOSPITAL_BASED_OUTPATIENT_CLINIC_OR_DEPARTMENT_OTHER): Payer: Medicaid Other

## 2019-04-14 ENCOUNTER — Encounter (HOSPITAL_BASED_OUTPATIENT_CLINIC_OR_DEPARTMENT_OTHER): Payer: Self-pay

## 2019-04-14 ENCOUNTER — Emergency Department (HOSPITAL_BASED_OUTPATIENT_CLINIC_OR_DEPARTMENT_OTHER)
Admission: EM | Admit: 2019-04-14 | Discharge: 2019-04-15 | Disposition: A | Discharge status: Home / Self Care | Payer: Medicaid Other | Attending: Emergency Medicine | Admitting: Emergency Medicine

## 2019-04-14 DIAGNOSIS — R1115 Cyclical vomiting syndrome unrelated to migraine: Secondary | ICD-10-CM | POA: Diagnosis not present

## 2019-04-14 DIAGNOSIS — Z91010 Allergy to peanuts: Secondary | ICD-10-CM | POA: Insufficient documentation

## 2019-04-14 DIAGNOSIS — F1210 Cannabis abuse, uncomplicated: Secondary | ICD-10-CM | POA: Diagnosis not present

## 2019-04-14 DIAGNOSIS — Z79899 Other long term (current) drug therapy: Secondary | ICD-10-CM | POA: Insufficient documentation

## 2019-04-14 DIAGNOSIS — F17210 Nicotine dependence, cigarettes, uncomplicated: Secondary | ICD-10-CM | POA: Diagnosis not present

## 2019-04-14 DIAGNOSIS — R1030 Lower abdominal pain, unspecified: Secondary | ICD-10-CM | POA: Diagnosis present

## 2019-04-14 DIAGNOSIS — I10 Essential (primary) hypertension: Secondary | ICD-10-CM | POA: Diagnosis not present

## 2019-04-14 LAB — CBC
HCT: 37.4 % (ref 36.0–46.0)
Hemoglobin: 11.7 g/dL — ABNORMAL LOW (ref 12.0–15.0)
MCH: 28.7 pg (ref 26.0–34.0)
MCHC: 31.3 g/dL (ref 30.0–36.0)
MCV: 91.9 fL (ref 80.0–100.0)
Platelets: 320 10*3/uL (ref 150–400)
RBC: 4.07 MIL/uL (ref 3.87–5.11)
RDW: 14.1 % (ref 11.5–15.5)
WBC: 24.4 10*3/uL — ABNORMAL HIGH (ref 4.0–10.5)
nRBC: 0 % (ref 0.0–0.2)

## 2019-04-14 LAB — COMPREHENSIVE METABOLIC PANEL
ALT: 11 U/L (ref 0–44)
AST: 18 U/L (ref 15–41)
Albumin: 4.3 g/dL (ref 3.5–5.0)
Alkaline Phosphatase: 67 U/L (ref 38–126)
Anion gap: 10 (ref 5–15)
BUN: 9 mg/dL (ref 6–20)
CO2: 21 mmol/L — ABNORMAL LOW (ref 22–32)
Calcium: 9.2 mg/dL (ref 8.9–10.3)
Chloride: 104 mmol/L (ref 98–111)
Creatinine, Ser: 0.79 mg/dL (ref 0.44–1.00)
GFR calc Af Amer: >60 mL/min (ref >60)
GFR calc non Af Amer: >60 mL/min (ref >60)
Glucose, Bld: 123 mg/dL — ABNORMAL HIGH (ref 70–99)
Potassium: 3.4 mmol/L — ABNORMAL LOW (ref 3.5–5.1)
Sodium: 135 mmol/L (ref 135–145)
Total Bilirubin: 0.6 mg/dL (ref 0.3–1.2)
Total Protein: 8.1 g/dL (ref 6.5–8.1)

## 2019-04-14 LAB — LIPASE, BLOOD: Lipase: 20 U/L (ref 11–51)

## 2019-04-14 MED ORDER — PROMETHAZINE HCL 25 MG/ML IJ SOLN
25.0000 mg | Freq: Once | INTRAMUSCULAR | Status: AC
  Administered 2019-04-14: 23:00:00 25 mg via INTRAVENOUS
  Filled 2019-04-14: qty 1

## 2019-04-14 MED ORDER — DROPERIDOL 2.5 MG/ML IJ SOLN: 2.5000 mg | Freq: Once | INTRAMUSCULAR | Status: DC

## 2019-04-14 MED ORDER — SODIUM CHLORIDE 0.9 % IV BOLUS
1000.0000 mL | Freq: Once | INTRAVENOUS | Status: AC
  Administered 2019-04-14: 1000 mL via INTRAVENOUS

## 2019-04-14 MED ORDER — HYDROMORPHONE HCL 1 MG/ML IJ SOLN
0.5000 mg | Freq: Once | INTRAMUSCULAR | Status: AC
  Administered 2019-04-14: 0.5 mg via INTRAVENOUS
  Filled 2019-04-14: qty 1

## 2019-04-14 MED ORDER — DROPERIDOL 2.5 MG/ML IJ SOLN
INTRAMUSCULAR | Status: AC
  Filled 2019-04-14: qty 2

## 2019-04-14 MED ORDER — DIPHENHYDRAMINE HCL 50 MG/ML IJ SOLN
25.0000 mg | Freq: Once | INTRAMUSCULAR | Status: AC
  Administered 2019-04-14: 25 mg via INTRAVENOUS
  Filled 2019-04-14: qty 1

## 2019-04-14 NOTE — ED Triage Notes (Signed)
Pt c/o abd pain and N/V since last night. Pt states she has not been able to keep anything down today.

## 2019-04-14 NOTE — ED Provider Notes (Signed)
[facility] EMERGENCY DEPARTMENT Provider Note   CSN: [id number] Arrival date & time: 04/14/19  2149     History   Chief Complaint No chief complaint on file.   HPI Sherry Key is a 33 y.o. female history of endometriosis and cyclic vomiting that occurs monthly with her period.   Abdominal pain started Saturday been constant since. Patient describes pain as cramping, lower abdominal pain that is constant 10/10. States this occurs every month with her period since age 58. Was diagnosed with endometriosis in February. States hysterectomy planned.   Today she is been vomiting all day.  Not able to drink or eat food.  Patient states she use Phenergan at 1 PM today with no relief.  States the only things that help her are Phenergan and Dilaudid.    Denies any changes or stool, has any fevers or chills, denies any cough congestion, cold exposure, chest pain or shortness of breath.       HPI  Past Medical History:  Diagnosis Date   Crohn's disease ([place])    Cyclical vomiting    Endometriosis    Fibroids    Hypertension    PCOS (polycystic ovarian syndrome)     There are no active problems to display for this patient.   Past Surgical History:  Procedure Laterality Date   CESAREAN SECTION     CESAREAN SECTION     CHOLECYSTECTOMY     HERNIA REPAIR     TONSILLECTOMY     TUBAL LIGATION     TUBAL LIGATION       OB History   No obstetric history on file.      Home Medications    Prior to Admission medications   Medication Sig Start Date End Date Taking? Authorizing Provider  diazepam (VALIUM) 5 MG tablet Take 0.5 tablets (2.5 mg total) every 8 (eight) hours as needed by mouth for anxiety or muscle spasms (spasms). 05/25/17   Mesner, Corene Cornea, MD  DOXEPIN HCL PO Take 1 capsule at bedtime by mouth.    [provider]  flintstones complete (FLINTSTONES) 60 MG chewable tablet Chew 1 tablet by mouth daily.      [provider]    GABAPENTIN PO Take 1 capsule 3 (three) times daily by mouth.    [provider]  HYDROcodone-acetaminophen (NORCO/VICODIN) 5-325 MG tablet Take 1-2 tablets every 6 (six) hours as needed by mouth for severe pain. 05/25/17   Mesner, Corene Cornea, MD  HYDROmorphone HCl (DILAUDID PO) Take 100 mg by mouth 2 (two) times daily.    [provider]  ibuprofen (ADVIL,MOTRIN) 800 MG tablet Take 1 tablet (800 mg total) 3 (three) times daily by mouth. 05/25/17   Mesner, Corene Cornea, MD  LABETALOL HCL PO Take 1 tablet 2 (two) times daily by mouth.    [provider]  LORazepam (ATIVAN PO) Take 1 tablet as needed by mouth (for anxiety).    [provider]  LORazepam (ATIVAN) 1 MG tablet Take 1 tablet (1 mg total) by mouth 3 (three) times daily as needed (vomiting). 03/29/19   Tegeler, Gwenyth Allegra, MD  metroNIDAZOLE (FLAGYL) 500 MG tablet Take 1 tablet (500 mg total) by mouth 2 (two) times daily. 10/15/52   [name], MD  potassium chloride SA (K-DUR) 20 MEQ tablet Take 1 tablet (20 mEq total) by mouth 2 (two) times daily. 12/09/68   [name], MD  promethazine (PHENERGAN) 25 MG suppository Place 1 suppository (25 mg total) rectally every 6 (six)  hours as needed for nausea or vomiting. 04/15/19   Tedd Sias, PA    Family History No family history on file.  Social History Social History   Tobacco Use   Smoking status: Current Every Day Smoker    Types: Cigarettes   Smokeless tobacco: Never Used  Substance Use Topics   Alcohol use: No    Frequency: Never   Drug use: No     Allergies   Dicyclomine, Morphine, Percocet [oxycodone-acetaminophen], Zofran [ondansetron hcl], Other, Peanut-containing drug products, Percocet [oxycodone-acetaminophen], Tomato, Zofran, and Haloperidol   Review of Systems Review of Systems  All other systems reviewed and are negative.    Physical Exam Updated Vital Signs BP (!) 166/114 (BP Location: Right Arm)    Pulse (!) 110    Temp  98.3 F (36.8 C) (Oral)    Resp 18    Ht 5' 3"  (1.6 m)    Wt 72.6 kg    LMP 04/14/2019    SpO2 100%    BMI 28.34 kg/m   Physical Exam Vitals signs and nursing note reviewed.  Constitutional:      General: She is in acute distress.     Appearance: She is ill-appearing. She is not toxic-appearing or diaphoretic.     Comments: Patient is retching during physical exam.  No vomiting at this time however small amount of clear fluid present in emesis bag.  HENT:     Head: Normocephalic and atraumatic.     Mouth/Throat:     Mouth: Mucous membranes are dry.  Eyes:     General: No scleral icterus. Neck:     Musculoskeletal: No neck rigidity.  Cardiovascular:     Rate and Rhythm: Normal rate and regular rhythm.     Pulses: Normal pulses.     Heart sounds: Normal heart sounds.  Pulmonary:     Effort: Pulmonary effort is normal.     Breath sounds: Normal breath sounds.  Abdominal:     General: Abdomen is flat. Bowel sounds are normal. There is no distension.     Palpations: Abdomen is soft.     Tenderness: There is abdominal tenderness (Lower abdominal region is tender). There is no right CVA tenderness, left CVA tenderness, guarding or rebound.  Musculoskeletal:     Right lower leg: No edema.     Left lower leg: No edema.  Skin:    General: Skin is warm and dry.     Capillary Refill: Capillary refill takes less than 2 seconds.  Neurological:     Mental Status: She is alert. Mental status is at baseline.  Psychiatric:        Behavior: Behavior normal.      ED Treatments / Results  Labs (all labs ordered are listed, but only abnormal results are displayed) Labs Reviewed  COMPREHENSIVE METABOLIC PANEL - Abnormal; Notable for the following components:      Result Value   Potassium 3.4 (*)    CO2 21 (*)    Glucose, Bld 123 (*)    All other components within normal limits  CBC - Abnormal; Notable for the following components:   WBC 24.4 (*)    Hemoglobin 11.7 (*)    All other  components within normal limits  URINALYSIS, ROUTINE W REFLEX MICROSCOPIC - Abnormal; Notable for the following components:   APPearance CLOUDY (*)    Hgb urine dipstick TRACE (*)    Ketones, ur 15 (*)    Leukocytes,Ua SMALL (*)    All  other components within normal limits  RAPID URINE DRUG SCREEN, HOSP PERFORMED - Abnormal; Notable for the following components:   Tetrahydrocannabinol POSITIVE (*)    All other components within normal limits  URINALYSIS, MICROSCOPIC (REFLEX) - Abnormal; Notable for the following components:   Bacteria, UA FEW (*)    All other components within normal limits  LIPASE, BLOOD  PREGNANCY, URINE    EKG None  Radiology Ct Abdomen Pelvis W Contrast  Result Date: 04/15/2019 CLINICAL DATA:  Abdominal pain. Nausea and vomiting EXAM: CT ABDOMEN AND PELVIS WITH CONTRAST TECHNIQUE: Multidetector CT imaging of the abdomen and pelvis was performed using the standard protocol following bolus administration of intravenous contrast. CONTRAST:  143m OMNIPAQUE IOHEXOL 300 MG/ML  SOLN COMPARISON:  CT abdomen pelvis 12/18/2018 FINDINGS: LOWER CHEST: No basilar pleural or apical pericardial effusion. HEPATOBILIARY: Normal hepatic contours. There is no intra- or extrahepatic biliary dilatation. Status post cholecystectomy. PANCREAS: Normal pancreatic contours without pancreatic ductal dilatation or peripancreatic fluid collection. SPLEEN: Normal. ADRENALS/URINARY TRACT: --Adrenal glands: Normal. --Right kidney/ureter: No hydronephrosis, nephroureterolithiasis or solid renal mass. --Left kidney/ureter: No hydronephrosis, nephroureterolithiasis or solid renal mass. --Urinary bladder: Normal for degree of distention STOMACH/BOWEL: --Stomach/Duodenum: There is no hiatal hernia. The duodenal course and caliber are normal. --Small bowel: No dilatation or inflammation. --Colon: No focal abnormality. --Appendix: Normal. VASCULAR/LYMPHATIC: Normal course and caliber of the major abdominal  vessels. No abdominal or pelvic lymphadenopathy. REPRODUCTIVE: Normal uterus and ovaries. MUSCULOSKELETAL. No bony spinal canal stenosis or focal osseous abnormality. OTHER: None. IMPRESSION: No acute abdominopelvic abnormality. Electronically Signed   By: KUlyses JarredM.D.   On: 04/15/2019 01:03    Procedures Procedures (including critical care time)  Medications Ordered in ED Medications  droperidol (INAPSINE) 2.5 MG/ML injection 2.5 mg (2.5 mg Intravenous Refused 04/14/19 2323)  sodium chloride 0.9 % bolus 1,000 mL (0 mLs Intravenous Stopped 04/14/19 2335)  promethazine (PHENERGAN) injection 25 mg (25 mg Intravenous Given 04/14/19 2256)  diphenhydrAMINE (BENADRYL) injection 25 mg (25 mg Intravenous Given 04/14/19 2252)  sodium chloride 0.9 % bolus 1,000 mL (1,000 mLs Intravenous New Bag/Given 04/14/19 2335)  HYDROmorphone (DILAUDID) injection 0.5 mg (0.5 mg Intravenous Given 04/14/19 2307)  iohexol (OMNIPAQUE) 300 MG/ML solution 100 mL (100 mLs Intravenous Contrast Given 04/15/19 0023)     Initial Impression / Assessment and Plan / ED Course  I have reviewed the triage vital signs and the nursing notes.  Pertinent labs & imaging results that were available during my care of the patient were reviewed by me and considered in my medical decision making (see chart for details).        Presents with 2 days of vomiting abdominal pain that she states is frequent occurrence for her that occurs monthly during her cycles.  Patient attributes this endometriosis.  Patient with UA showing small leukocytes, few bacteria with white count 24.4.  On chart review it appears that she has had prior elevations of white count even higher than this during episodes of vomiting.   CT scan shows no acute abnormality.   Patient states she uses marijuana frequently during periods for pain. Discussed likelihood that this is causing her nausea.   Patient given 2 L normal saline, droperidol, hydromorphone and  promethazine and diphenhydramine during ED visit.  1:25 AM Patient appears well now. Feels much better. Requests refill of phenergan. Discussed borderline UA. Patient states she is okay with no treatment as unlikely to be a true UTI.       The patient appears  reasonably screened and/or stabilized for discharge and I doubt any other medical condition or other [facility] requiring further screening, evaluation, or treatment in the ED at this time prior to discharge.  Patient is hemodynamically stable, in NAD, and able to ambulate in the ED. Pain has been managed or a plan has been made for home management and has no complaints prior to discharge. Patient is comfortable with above plan and is stable for discharge at this time. All questions were answered prior to disposition. Results from the ER workup discussed with the patient face to face and all questions answered to the best of my ability. The patient is safe for discharge with strict return precautions. Patient appears safe for discharge with appropriate follow-up.  Conveyed my impression with the patient and he voiced understanding and is agreeable to plan.   An After Visit Summary was printed and given to the patient.  Portions of this note were generated with [employer]. Dictation errors may occur despite best attempts at proofreading.    Final Clinical Impressions(s) / ED Diagnoses   Final diagnoses:  Cyclical vomiting    ED Discharge Orders         Ordered    promethazine (PHENERGAN) 25 MG suppository  Every 6 hours PRN     04/15/19 0123           Tedd Sias, PA 04/15/19 0130    Lennice Sites, DO 04/15/19 1714

## 2019-04-15 LAB — URINALYSIS, ROUTINE W REFLEX MICROSCOPIC
Bilirubin Urine: NEGATIVE
Glucose, UA: NEGATIVE mg/dL
Ketones, ur: 15 mg/dL — AB
Nitrite: NEGATIVE
Protein, ur: NEGATIVE mg/dL
Specific Gravity, Urine: 1.015 (ref 1.005–1.030)
pH: 7 (ref 5.0–8.0)

## 2019-04-15 LAB — RAPID URINE DRUG SCREEN, HOSP PERFORMED
Amphetamines: NOT DETECTED
Barbiturates: NOT DETECTED
Benzodiazepines: NOT DETECTED
Cocaine: NOT DETECTED
Opiates: NOT DETECTED
Tetrahydrocannabinol: POSITIVE — AB

## 2019-04-15 LAB — URINALYSIS, MICROSCOPIC (REFLEX)

## 2019-04-15 LAB — PREGNANCY, URINE: Preg Test, Ur: NEGATIVE

## 2019-04-15 MED ORDER — IOHEXOL 300 MG/ML  SOLN
100.0000 mL | Freq: Once | INTRAMUSCULAR | Status: AC | PRN
  Administered 2019-04-15: 100 mL via INTRAVENOUS

## 2019-04-15 MED ORDER — PROMETHAZINE HCL 25 MG RE SUPP: 25.0000 mg | Freq: Four times a day (QID) | RECTAL | 0 refills | Status: DC | PRN

## 2019-04-15 NOTE — Discharge Instructions (Signed)
Follow up with primary care doctor in the next 2-3 days. Use phenergan for nausea.

## 2019-04-16 LAB — URINE CULTURE

## 2019-04-27 ENCOUNTER — Emergency Department (HOSPITAL_BASED_OUTPATIENT_CLINIC_OR_DEPARTMENT_OTHER)
Admission: EM | Admit: 2019-04-27 | Discharge: 2019-04-27 | Disposition: A | Discharge status: Home / Self Care | Payer: Medicaid Other | Attending: Emergency Medicine | Admitting: Emergency Medicine

## 2019-04-27 ENCOUNTER — Other Ambulatory Visit: Payer: Self-pay

## 2019-04-27 ENCOUNTER — Encounter (HOSPITAL_BASED_OUTPATIENT_CLINIC_OR_DEPARTMENT_OTHER): Payer: Self-pay | Admitting: *Deleted

## 2019-04-27 DIAGNOSIS — R1030 Lower abdominal pain, unspecified: Secondary | ICD-10-CM

## 2019-04-27 DIAGNOSIS — N3001 Acute cystitis with hematuria: Secondary | ICD-10-CM

## 2019-04-27 DIAGNOSIS — Z91010 Allergy to peanuts: Secondary | ICD-10-CM | POA: Diagnosis not present

## 2019-04-27 DIAGNOSIS — I10 Essential (primary) hypertension: Secondary | ICD-10-CM | POA: Insufficient documentation

## 2019-04-27 DIAGNOSIS — Z79899 Other long term (current) drug therapy: Secondary | ICD-10-CM | POA: Insufficient documentation

## 2019-04-27 DIAGNOSIS — F17210 Nicotine dependence, cigarettes, uncomplicated: Secondary | ICD-10-CM | POA: Insufficient documentation

## 2019-04-27 LAB — CBC WITH DIFFERENTIAL/PLATELET
Abs Immature Granulocytes: 0.05 10*3/uL (ref 0.00–0.07)
Basophils Absolute: 0 10*3/uL (ref 0.0–0.1)
Basophils Relative: 0 %
Eosinophils Absolute: 0.1 10*3/uL (ref 0.0–0.5)
Eosinophils Relative: 0 %
HCT: 40.2 % (ref 36.0–46.0)
Hemoglobin: 12.6 g/dL (ref 12.0–15.0)
Immature Granulocytes: 0 %
Lymphocytes Relative: 8 %
Lymphs Abs: 0.9 10*3/uL (ref 0.7–4.0)
MCH: 28.3 pg (ref 26.0–34.0)
MCHC: 31.3 g/dL (ref 30.0–36.0)
MCV: 90.3 fL (ref 80.0–100.0)
Monocytes Absolute: 0.6 10*3/uL (ref 0.1–1.0)
Monocytes Relative: 5 %
Neutro Abs: 9.7 10*3/uL — ABNORMAL HIGH (ref 1.7–7.7)
Neutrophils Relative %: 87 %
Platelets: 361 10*3/uL (ref 150–400)
RBC: 4.45 MIL/uL (ref 3.87–5.11)
RDW: 14.2 % (ref 11.5–15.5)
WBC: 11.4 10*3/uL — ABNORMAL HIGH (ref 4.0–10.5)
nRBC: 0 % (ref 0.0–0.2)

## 2019-04-27 LAB — COMPREHENSIVE METABOLIC PANEL
ALT: 13 U/L (ref 0–44)
AST: 20 U/L (ref 15–41)
Albumin: 4.6 g/dL (ref 3.5–5.0)
Alkaline Phosphatase: 75 U/L (ref 38–126)
Anion gap: 14 (ref 5–15)
BUN: 11 mg/dL (ref 6–20)
CO2: 21 mmol/L — ABNORMAL LOW (ref 22–32)
Calcium: 9.8 mg/dL (ref 8.9–10.3)
Chloride: 104 mmol/L (ref 98–111)
Creatinine, Ser: 0.79 mg/dL (ref 0.44–1.00)
GFR calc Af Amer: >60 mL/min (ref >60)
GFR calc non Af Amer: >60 mL/min (ref >60)
Glucose, Bld: 107 mg/dL — ABNORMAL HIGH (ref 70–99)
Potassium: 3.1 mmol/L — ABNORMAL LOW (ref 3.5–5.1)
Sodium: 139 mmol/L (ref 135–145)
Total Bilirubin: 0.9 mg/dL (ref 0.3–1.2)
Total Protein: 8.5 g/dL — ABNORMAL HIGH (ref 6.5–8.1)

## 2019-04-27 LAB — URINALYSIS, MICROSCOPIC (REFLEX)

## 2019-04-27 LAB — URINALYSIS, ROUTINE W REFLEX MICROSCOPIC
Bilirubin Urine: NEGATIVE
Glucose, UA: NEGATIVE mg/dL
Ketones, ur: >80 mg/dL — AB
Leukocytes,Ua: NEGATIVE
Nitrite: POSITIVE — AB
Protein, ur: NEGATIVE mg/dL
Specific Gravity, Urine: 1.025 (ref 1.005–1.030)
pH: 6.5 (ref 5.0–8.0)

## 2019-04-27 LAB — PREGNANCY, URINE: Preg Test, Ur: NEGATIVE

## 2019-04-27 MED ORDER — PROMETHAZINE HCL 25 MG/ML IJ SOLN
25.0000 mg | Freq: Once | INTRAMUSCULAR | Status: AC
  Administered 2019-04-27: 25 mg via INTRAVENOUS
  Filled 2019-04-27: qty 1

## 2019-04-27 MED ORDER — CEPHALEXIN 500 MG PO CAPS: 500.0000 mg | ORAL_CAPSULE | Freq: Three times a day (TID) | ORAL | 0 refills | Status: AC

## 2019-04-27 MED ORDER — DROPERIDOL 2.5 MG/ML IJ SOLN
2.5000 mg | Freq: Once | INTRAMUSCULAR | Status: AC
  Administered 2019-04-27: 17:00:00 2.5 mg via INTRAVENOUS
  Filled 2019-04-27: qty 2

## 2019-04-27 MED ORDER — DIPHENHYDRAMINE HCL 50 MG/ML IJ SOLN
25.0000 mg | Freq: Once | INTRAMUSCULAR | Status: AC
  Administered 2019-04-27: 25 mg via INTRAVENOUS
  Filled 2019-04-27: qty 1

## 2019-04-27 MED ORDER — HYDROMORPHONE HCL 1 MG/ML IJ SOLN
0.5000 mg | Freq: Once | INTRAMUSCULAR | Status: AC
  Administered 2019-04-27: 0.5 mg via INTRAVENOUS
  Filled 2019-04-27: qty 1

## 2019-04-27 MED ORDER — CEPHALEXIN 250 MG PO CAPS
500.0000 mg | ORAL_CAPSULE | Freq: Once | ORAL | Status: AC
  Administered 2019-04-27: 500 mg via ORAL
  Filled 2019-04-27: qty 2

## 2019-04-27 MED ORDER — SODIUM CHLORIDE 0.9 % IV BOLUS
1000.0000 mL | Freq: Once | INTRAVENOUS | Status: AC
  Administered 2019-04-27: 17:00:00 1000 mL via INTRAVENOUS

## 2019-04-27 NOTE — Discharge Instructions (Signed)
Take antibiotics as directed. Please take all of your antibiotics until finished.  Make sure you drink plenty fluids and stay hydrated.  Return the emergency department for any worsening pain, fevers, blood in urine, inability to eat or drink anything or any other worsening concerning symptoms.

## 2019-04-27 NOTE — ED Notes (Signed)
Lab notified of order to culture urine.

## 2019-04-27 NOTE — ED Triage Notes (Signed)
Abdominal pain. Reports N/V that started today.

## 2019-04-27 NOTE — ED Notes (Addendum)
Pt states that she has been taking Advil for pain but has history of endometriosis and has not been able to keep anything down. Pt states that she used her phenergan suppository and it gave her "some diarrhea behind it"

## 2019-04-27 NOTE — ED Notes (Signed)
PO challenge given per EDP.

## 2019-04-27 NOTE — ED Notes (Signed)
ED Provider at bedside. 

## 2019-04-27 NOTE — ED Notes (Addendum)
Attempted IV x 2 to left arm, unsuccessful, tol well. Another RN will attempt IV with U/S

## 2019-04-27 NOTE — ED Notes (Signed)
RN at bedside attempting US IV.

## 2019-04-27 NOTE — ED Provider Notes (Signed)
[facility] Provider Note   CSN: [id number] Arrival date & time: 04/27/19  1545     History   Chief Complaint Chief Complaint  Patient presents with   Abdominal Pain    HPI Sherry Key is a 33 y.o. female past history of endometriosis, Crohn's who presents for evaluation of vomiting, lower abdominal pain that began this morning.  She reports that she has had multiple episodes of nonbloody, nonbilious vomiting.  She reports she is trying taking p.o. Phenergan as well as Phenergan suppository with no improvement in her vomiting.  She reports that with her endometriosis, she will frequently give flares of abdominal pain as well as vomiting.  She states that she has tried taking Advil with no improvement.  She did have one episode of diarrhea after she took a suppository.  None since.  She has not noted any fevers.  She notes no blood in stool or vomit.  She denies any urinary complaints.     The history is provided by the patient.    Past Medical History:  Diagnosis Date   Crohn's disease ([place])    Cyclical vomiting    Endometriosis    Fibroids    Hypertension    PCOS (polycystic ovarian syndrome)     There are no active problems to display for this patient.   Past Surgical History:  Procedure Laterality Date   CESAREAN SECTION     CESAREAN SECTION     CHOLECYSTECTOMY     HERNIA REPAIR     TONSILLECTOMY     TUBAL LIGATION     TUBAL LIGATION       OB History   No obstetric history on file.      Home Medications    Prior to Admission medications   Medication Sig Start Date End Date Taking? Authorizing Provider  cephALEXin (KEFLEX) 500 MG capsule Take 1 capsule (500 mg total) by mouth 3 (three) times daily for 7 days. 04/27/19 05/04/19  Volanda Napoleon, PA-C  diazepam (VALIUM) 5 MG tablet Take 0.5 tablets (2.5 mg total) every 8 (eight) hours as needed by mouth for anxiety or muscle spasms (spasms). 05/25/17    Mesner, Corene Cornea, MD  DOXEPIN HCL PO Take 1 capsule at bedtime by mouth.    [provider]  flintstones complete (FLINTSTONES) 60 MG chewable tablet Chew 1 tablet by mouth daily.      [provider]  GABAPENTIN PO Take 1 capsule 3 (three) times daily by mouth.    [provider]  HYDROcodone-acetaminophen (NORCO/VICODIN) 5-325 MG tablet Take 1-2 tablets every 6 (six) hours as needed by mouth for severe pain. 05/25/17   Mesner, Corene Cornea, MD  HYDROmorphone HCl (DILAUDID PO) Take 100 mg by mouth 2 (two) times daily.    [provider]  ibuprofen (ADVIL,MOTRIN) 800 MG tablet Take 1 tablet (800 mg total) 3 (three) times daily by mouth. 05/25/17   Mesner, Corene Cornea, MD  LABETALOL HCL PO Take 1 tablet 2 (two) times daily by mouth.    [provider]  LORazepam (ATIVAN PO) Take 1 tablet as needed by mouth (for anxiety).    [provider]  LORazepam (ATIVAN) 1 MG tablet Take 1 tablet (1 mg total) by mouth 3 (three) times daily as needed (vomiting). 03/29/19   Tegeler, Gwenyth Allegra, MD  metroNIDAZOLE (FLAGYL) 500 MG tablet Take 1 tablet (500 mg total) by mouth 2 (two) times daily. 0/1/74   [name], MD  potassium chloride  SA (K-DUR) 20 MEQ tablet Take 1 tablet (20 mEq total) by mouth 2 (two) times daily. 0/9/73   [name], MD  promethazine (PHENERGAN) 25 MG suppository Place 1 suppository (25 mg total) rectally every 6 (six) hours as needed for nausea or vomiting. 04/15/19   Tedd Sias, PA    Family History History reviewed. No pertinent family history.  Social History Social History   Tobacco Use   Smoking status: Current Every Day Smoker    Types: Cigarettes   Smokeless tobacco: Never Used  Substance Use Topics   Alcohol use: No    Frequency: Never   Drug use: No     Allergies   Dicyclomine, Morphine, Percocet [oxycodone-acetaminophen], Zofran [ondansetron hcl], Other, Peanut-containing drug products, Percocet  [oxycodone-acetaminophen], Tomato, Zofran, and Haloperidol   Review of Systems Review of Systems  Constitutional: Negative for fever.  Respiratory: Negative for cough and shortness of breath.   Cardiovascular: Negative for chest pain.  Gastrointestinal: Positive for abdominal pain, diarrhea, nausea and vomiting.  Genitourinary: Negative for dysuria and hematuria.  Neurological: Negative for headaches.  All other systems reviewed and are negative.    Physical Exam Updated Vital Signs BP 113/75 (BP Location: Right Arm)    Pulse 80    Temp 98.5 F (36.9 C) (Oral)    Resp 20    Ht 5' 3"  (1.6 m)    Wt 72.6 kg    LMP 04/14/2019 Comment: (-) urine pregnancy//ac   SpO2 100%    BMI 28.34 kg/m   Physical Exam Vitals signs and nursing note reviewed.  Constitutional:      Appearance: Normal appearance. She is well-developed.     Comments: Actively vomiting.  HENT:     Head: Normocephalic and atraumatic.  Eyes:     General: Lids are normal.     Conjunctiva/sclera: Conjunctivae normal.     Pupils: Pupils are equal, round, and reactive to light.  Neck:     Musculoskeletal: Full passive range of motion without pain.  Cardiovascular:     Rate and Rhythm: Normal rate and regular rhythm.     Pulses: Normal pulses.     Heart sounds: Normal heart sounds. No murmur. No friction rub. No gallop.   Pulmonary:     Effort: Pulmonary effort is normal.     Breath sounds: Normal breath sounds.     Comments: Lungs clear to auscultation bilaterally.  Symmetric chest rise.  No wheezing, rales, rhonchi. Abdominal:     Palpations: Abdomen is soft. Abdomen is not rigid.     Tenderness: There is abdominal tenderness in the right lower quadrant, suprapubic area and left lower quadrant. There is right CVA tenderness and left CVA tenderness. There is no guarding.     Comments: Abdomen is soft, nondistended.  No rigidity, guarding.  Diffuse tenderness in the lower abdomen.  No focal point.  Her pain is slightly  worse in the right lower quadrant but is diffuse and not specific to McBurney's point.  Bilateral CVA tenderness noted.  Musculoskeletal: Normal range of motion.  Skin:    General: Skin is warm and dry.     Capillary Refill: Capillary refill takes less than 2 seconds.  Neurological:     Mental Status: She is alert and oriented to person, place, and time.  Psychiatric:        Speech: Speech normal.      ED Treatments / Results  Labs (all labs ordered are listed, but only abnormal results are displayed)  Labs Reviewed  COMPREHENSIVE METABOLIC PANEL - Abnormal; Notable for the following components:      Result Value   Potassium 3.1 (*)    CO2 21 (*)    Glucose, Bld 107 (*)    Total Protein 8.5 (*)    All other components within normal limits  CBC WITH DIFFERENTIAL/PLATELET - Abnormal; Notable for the following components:   WBC 11.4 (*)    Neutro Abs 9.7 (*)    All other components within normal limits  URINALYSIS, ROUTINE W REFLEX MICROSCOPIC - Abnormal; Notable for the following components:   APPearance CLOUDY (*)    Hgb urine dipstick SMALL (*)    Ketones, ur >80 (*)    Nitrite POSITIVE (*)    All other components within normal limits  URINALYSIS, MICROSCOPIC (REFLEX) - Abnormal; Notable for the following components:   Bacteria, UA MANY (*)    All other components within normal limits  URINE CULTURE  PREGNANCY, URINE    EKG EKG Interpretation  Date/Time:  Sunday April 27 2019 16:41:22 EDT Ventricular Rate:  127 PR Interval:    QRS Duration: 83 QT Interval:  308 QTC Calculation: 448 R Axis:   84 Text Interpretation:  Sinus tachycardia Ventricular premature complex LAE, consider biatrial enlargement Probable LVH with secondary repol abnrm ST depr, consider ischemia, inferior leads since last tracing no significant change Confirmed by Belfi, Melanie ([id number]) on 04/27/2019 7:28:20 PM   Radiology No results found.  Procedures Procedures (including critical care  time)  Medications Ordered in ED Medications  sodium chloride 0.9 % bolus 1,000 mL ( Intravenous Stopped 04/27/19 1854)  droperidol (INAPSINE) 2.5 MG/ML injection 2.5 mg (2.5 mg Intravenous Given 04/27/19 1726)  promethazine (PHENERGAN) injection 25 mg (25 mg Intravenous Given 04/27/19 1726)  diphenhydrAMINE (BENADRYL) injection 25 mg (25 mg Intravenous Given 04/27/19 1726)  HYDROmorphone (DILAUDID) injection 0.5 mg (0.5 mg Intravenous Given 04/27/19 1855)  cephALEXin (KEFLEX) capsule 500 mg (500 mg Oral Given 04/27/19 2134)     Initial Impression / Assessment and Plan / ED Course  I have reviewed the triage vital signs and the nursing notes.  Pertinent labs & imaging results that were available during my care of the patient were reviewed by me and considered in my medical decision making (see chart for details).        33  year old female presents for evaluation of lower abdominal pain, nausea/vomiting that began today.  History of endometriosis which she states will occasionally cause her some abdominal flares, nausea/vomiting.  No fevers.  An episode of diarrhea.  No urinary complaints.  On initial ED arrival, she is slightly tachycardic, afebrile.  She is actively vomiting but appears in no acute distress.  She does have diffuse tenderness noted to lower abdomen that is slightly greater on the right than the left but no rigidity, guarding.  No focal tenderness no McBurney's point.  Consider infectious etiology versus UTI versus chronic abdominal pain with her flare.  Low suspicion for appendicitis but also consideration.  We will plan to check labs, antiemetics and reassess.  CBC shows slight leukocytosis of 11.4.  CMP shows potassium of 3.1, bicarb of 21.  UA shows small hemoglobin, positive nitrites.  We will plan to treat.  She still having some pain.  Will give additional analgesics.  Reevaluation.  Patient resting comfortably in the ED.  Reports improvement in pain.  She has been  able to tolerate p.o. without any vomiting.  Repeat abdominal exam shows improved tenderness.  No rigidity,  guarding.  No focal tenderness noted McBurney's point that would be concerning for appendicitis.  Patient states she is ready to go home.  Given that she is able to tolerate p.o., her vital signs are reassuring, feel that she is stable for discharge home.  We will plan to treat her UTI.  At this time, do not feel that she is has a kidney stone, acute intra-abdominal pathology that would require CT imaging.  Patient with no known drug allergies to Keflex and has tolerated it before. At this time, patient exhibits no emergent life-threatening condition that require further evaluation in ED or admission. Patient had ample opportunity for questions and discussion. All patient's questions were answered with full understanding. Strict return precautions discussed. Patient expresses understanding and agreement to plan.   Portions of this note were generated with Lobbyist. Dictation errors may occur despite best attempts at proofreading.  Final Clinical Impressions(s) / ED Diagnoses   Final diagnoses:  Lower abdominal pain  Acute cystitis with hematuria    ED Discharge Orders         Ordered    cephALEXin (KEFLEX) 500 MG capsule  3 times daily     04/27/19 2113           Volanda Napoleon, PA-C 04/28/19 1945    Malvin Johns, MD 05/06/19 [phone]

## 2019-04-29 ENCOUNTER — Emergency Department (HOSPITAL_BASED_OUTPATIENT_CLINIC_OR_DEPARTMENT_OTHER)
Admission: EM | Admit: 2019-04-29 | Discharge: 2019-04-29 | Disposition: A | Discharge status: Home / Self Care | Payer: Medicaid Other | Attending: Emergency Medicine | Admitting: Emergency Medicine

## 2019-04-29 ENCOUNTER — Other Ambulatory Visit: Payer: Self-pay

## 2019-04-29 ENCOUNTER — Encounter (HOSPITAL_BASED_OUTPATIENT_CLINIC_OR_DEPARTMENT_OTHER): Payer: Self-pay | Admitting: Emergency Medicine

## 2019-04-29 DIAGNOSIS — Z79899 Other long term (current) drug therapy: Secondary | ICD-10-CM | POA: Diagnosis not present

## 2019-04-29 DIAGNOSIS — R1115 Cyclical vomiting syndrome unrelated to migraine: Secondary | ICD-10-CM

## 2019-04-29 DIAGNOSIS — Z91010 Allergy to peanuts: Secondary | ICD-10-CM | POA: Diagnosis not present

## 2019-04-29 DIAGNOSIS — R112 Nausea with vomiting, unspecified: Secondary | ICD-10-CM | POA: Diagnosis present

## 2019-04-29 DIAGNOSIS — I10 Essential (primary) hypertension: Secondary | ICD-10-CM | POA: Diagnosis not present

## 2019-04-29 DIAGNOSIS — F17210 Nicotine dependence, cigarettes, uncomplicated: Secondary | ICD-10-CM | POA: Insufficient documentation

## 2019-04-29 LAB — CBC WITH DIFFERENTIAL/PLATELET
Abs Immature Granulocytes: 0.02 10*3/uL (ref 0.00–0.07)
Basophils Absolute: 0 10*3/uL (ref 0.0–0.1)
Basophils Relative: 1 %
Eosinophils Absolute: 0.1 10*3/uL (ref 0.0–0.5)
Eosinophils Relative: 1 %
HCT: 36 % (ref 36.0–46.0)
Hemoglobin: 11.2 g/dL — ABNORMAL LOW (ref 12.0–15.0)
Immature Granulocytes: 0 %
Lymphocytes Relative: 13 %
Lymphs Abs: 1 10*3/uL (ref 0.7–4.0)
MCH: 28.4 pg (ref 26.0–34.0)
MCHC: 31.1 g/dL (ref 30.0–36.0)
MCV: 91.1 fL (ref 80.0–100.0)
Monocytes Absolute: 0.6 10*3/uL (ref 0.1–1.0)
Monocytes Relative: 7 %
Neutro Abs: 6.3 10*3/uL (ref 1.7–7.7)
Neutrophils Relative %: 78 %
Platelets: 307 10*3/uL (ref 150–400)
RBC: 3.95 MIL/uL (ref 3.87–5.11)
RDW: 14.2 % (ref 11.5–15.5)
WBC: 8.1 10*3/uL (ref 4.0–10.5)
nRBC: 0 % (ref 0.0–0.2)

## 2019-04-29 LAB — COMPREHENSIVE METABOLIC PANEL
ALT: 11 U/L (ref 0–44)
AST: 17 U/L (ref 15–41)
Albumin: 4.1 g/dL (ref 3.5–5.0)
Alkaline Phosphatase: 64 U/L (ref 38–126)
Anion gap: 9 (ref 5–15)
BUN: 6 mg/dL (ref 6–20)
CO2: 23 mmol/L (ref 22–32)
Calcium: 9.4 mg/dL (ref 8.9–10.3)
Chloride: 107 mmol/L (ref 98–111)
Creatinine, Ser: 0.81 mg/dL (ref 0.44–1.00)
GFR calc Af Amer: >60 mL/min (ref >60)
GFR calc non Af Amer: >60 mL/min (ref >60)
Glucose, Bld: 93 mg/dL (ref 70–99)
Potassium: 3.4 mmol/L — ABNORMAL LOW (ref 3.5–5.1)
Sodium: 139 mmol/L (ref 135–145)
Total Bilirubin: 0.8 mg/dL (ref 0.3–1.2)
Total Protein: 7.5 g/dL (ref 6.5–8.1)

## 2019-04-29 LAB — HCG, QUANTITATIVE, PREGNANCY: hCG, Beta Chain, Quant, S: <1 m[IU]/mL (ref <5)

## 2019-04-29 MED ORDER — SODIUM CHLORIDE 0.9 % IV BOLUS
1000.0000 mL | Freq: Once | INTRAVENOUS | Status: AC
  Administered 2019-04-29: 1000 mL via INTRAVENOUS

## 2019-04-29 MED ORDER — HYDROMORPHONE HCL 1 MG/ML IJ SOLN
0.5000 mg | Freq: Once | INTRAMUSCULAR | Status: AC
  Administered 2019-04-29: 0.5 mg via INTRAVENOUS
  Filled 2019-04-29: qty 1

## 2019-04-29 MED ORDER — KETAMINE HCL 10 MG/ML IJ SOLN
0.3000 mg/kg | Freq: Once | INTRAMUSCULAR | Status: AC
  Administered 2019-04-29: 20 mg via INTRAVENOUS
  Filled 2019-04-29: qty 1

## 2019-04-29 NOTE — Discharge Instructions (Addendum)
Continue your home medications as previously prescribed. Return to the ED for worsening symptoms, worsening abdominal pain, inability to keep fluids down, fever, chest pain or shortness of breath.

## 2019-04-29 NOTE — ED Triage Notes (Signed)
Pt sts she has been vomiting since she left here 2 days ago. Sts she was seen for UTI and believes the medicines she was given, the Droperidol and Abx, are what is making her sick.

## 2019-04-29 NOTE — ED Provider Notes (Signed)
[facility] Provider Note   CSN: [id number] Arrival date & time: 04/29/19  0911     History   Chief Complaint Chief Complaint  Patient presents with  . Emesis    HPI Sherry Key is a 33 y.o. female with a past medical history of cyclical vomiting syndrome, endometriosis, hypertension presents to the ED for evaluation of continued nausea, vomiting, abdominal pain for the past week.  Seen and evaluated 2 days ago and was given antiemetics but states that she never felt better.  States that her home antiemetics are not helping either including her Phenergan suppositories.  States that she has the symptoms around the time of her menstrual cycle due to her endometriosis.  She was given antibiotics for UTI 2 days ago but does not feel that she has a UTI.  Denies any diarrhea, sick contacts with similar symptoms, fever, back pain.  States that the droperidol caused her to have a reaction where her "jaws were clenched up."  States that usually ketamine and Ativan help with her symptoms.     HPI  Past Medical History:  Diagnosis Date  . Crohn's disease ([place])   . Cyclical vomiting   . Endometriosis   . Fibroids   . Hypertension   . PCOS (polycystic ovarian syndrome)     There are no active problems to display for this patient.   Past Surgical History:  Procedure Laterality Date  . CESAREAN SECTION    . CESAREAN SECTION    . CHOLECYSTECTOMY    . HERNIA REPAIR    . TONSILLECTOMY    . TUBAL LIGATION    . TUBAL LIGATION       OB History   No obstetric history on file.      Home Medications    Prior to Admission medications   Medication Sig Start Date End Date Taking? Authorizing Provider  pantoprazole (PROTONIX) 40 MG tablet Take by mouth. 10/14/18  Yes [provider]  cephALEXin (KEFLEX) 500 MG capsule Take 1 capsule (500 mg total) by mouth 3 (three) times daily for 7 days. 04/27/19 05/04/19  Volanda Napoleon, PA-C  Elagolix  Sodium (ORILISSA) 200 MG TABS Take by mouth.    [provider]  flintstones complete (FLINTSTONES) 60 MG chewable tablet Chew 1 tablet by mouth daily.      [provider]  GABAPENTIN PO Take 1 capsule 3 (three) times daily by mouth.    [provider]  ibuprofen (ADVIL,MOTRIN) 800 MG tablet Take 1 tablet (800 mg total) 3 (three) times daily by mouth. 05/25/17   Mesner, Corene Cornea, MD  LABETALOL HCL PO Take 1 tablet 2 (two) times daily by mouth.    [provider]  promethazine (PHENERGAN) 25 MG suppository Place 1 suppository (25 mg total) rectally every 6 (six) hours as needed for nausea or vomiting. 04/15/19   Tedd Sias, PA    Family History No family history on file.  Social History Social History   Tobacco Use  . Smoking status: Current Every Day Smoker    Packs/day: 0.50    Types: Cigarettes  . Smokeless tobacco: Never Used  Substance Use Topics  . Alcohol use: No    Frequency: Never  . Drug use: No     Allergies   Dicyclomine, Morphine, Percocet [oxycodone-acetaminophen], Zofran [ondansetron hcl], Other, Peanut-containing drug products, Percocet [oxycodone-acetaminophen], Tomato, Zofran, and Haloperidol   Review of Systems Review of Systems  Constitutional: Negative for appetite change, chills  and fever.  HENT: Negative for ear pain, rhinorrhea, sneezing and sore throat.   Eyes: Negative for photophobia and visual disturbance.  Respiratory: Negative for cough, chest tightness, shortness of breath and wheezing.   Cardiovascular: Negative for chest pain and palpitations.  Gastrointestinal: Positive for abdominal pain, nausea and vomiting. Negative for blood in stool, constipation and diarrhea.  Genitourinary: Negative for dysuria, hematuria and urgency.  Musculoskeletal: Negative for myalgias.  Skin: Negative for rash.  Neurological: Negative for dizziness, weakness and light-headedness.     Physical Exam Updated Vital Signs BP  (!) 138/105 (BP Location: Right Arm)   Pulse 87   Temp 98.5 F (36.9 C) (Oral)   Resp 16   Ht 5' 3"  (1.6 m)   Wt 65.8 kg   LMP 04/14/2019 Comment: (-) urine pregnancy//ac  SpO2 100%   BMI 25.69 kg/m   Physical Exam Vitals signs and nursing note reviewed.  Constitutional:      General: She is not in acute distress.    Appearance: She is well-developed.  HENT:     Head: Normocephalic and atraumatic.     Nose: Nose normal.  Eyes:     General: No scleral icterus.       Left eye: No discharge.     Conjunctiva/sclera: Conjunctivae normal.  Neck:     Musculoskeletal: Normal range of motion and neck supple.  Cardiovascular:     Rate and Rhythm: Normal rate and regular rhythm.     Heart sounds: Normal heart sounds. No murmur. No friction rub. No gallop.   Pulmonary:     Effort: Pulmonary effort is normal. No respiratory distress.     Breath sounds: Normal breath sounds.  Abdominal:     General: Bowel sounds are normal. There is no distension.     Palpations: Abdomen is soft.     Tenderness: There is abdominal tenderness (generalized). There is no guarding.  Musculoskeletal: Normal range of motion.  Skin:    General: Skin is warm and dry.     Findings: No rash.  Neurological:     Mental Status: She is alert.     Motor: No abnormal muscle tone.     Coordination: Coordination normal.      ED Treatments / Results  Labs (all labs ordered are listed, but only abnormal results are displayed) Labs Reviewed  COMPREHENSIVE METABOLIC PANEL - Abnormal; Notable for the following components:      Result Value   Potassium 3.4 (*)    All other components within normal limits  CBC WITH DIFFERENTIAL/PLATELET - Abnormal; Notable for the following components:   Hemoglobin 11.2 (*)    All other components within normal limits  HCG, QUANTITATIVE, PREGNANCY    EKG None  Radiology No results found.  Procedures Procedures (including critical care time)  Medications Ordered in ED  Medications  ketamine (KETALAR) injection 20 mg (20 mg Intravenous Given 04/29/19 1030)  sodium chloride 0.9 % bolus 1,000 mL ( Intravenous Stopped 04/29/19 1129)  HYDROmorphone (DILAUDID) injection 0.5 mg (0.5 mg Intravenous Given 04/29/19 1056)  HYDROmorphone (DILAUDID) injection 0.5 mg (0.5 mg Intravenous Given 04/29/19 1147)     Initial Impression / Assessment and Plan / ED Course  I have reviewed the triage vital signs and the nursing notes.  Pertinent labs & imaging results that were available during my care of the patient were reviewed by me and considered in my medical decision making (see chart for details).  Clinical Course as of Apr 28 1222  Tue Apr 29, 2019  1144 Reports improvement in nausea and vomiting but continues to have pain.   [HK]    Clinical Course User Index [HK] Delia Heady, PA-C       33 year old female presents to ED for flareup of cyclical vomiting.  States that this happened around this time of the month while she is on her menstrual cycle.  No improvement with her home medications or when she was here 2 days ago. Abdomen generally tender on exam without rebound or guarding noted. Vital signs are reassuring. States ketamine and ativan usually help with her symptoms. Will obtain labs, give medications and reassess.  Labwork is reassuring and similar to prior. Given ketamine and dilaudid with resolution of her symptoms. Suspect this is a flare up of her cyclical vomiting syndrome and chronic pain from endometriosis.  We will have her continue her home medications and return for worsening symptoms.  Patient is hemodynamically stable, in NAD, and able to ambulate in the ED. Evaluation does not show pathology that would require ongoing emergent intervention or inpatient treatment. I explained the diagnosis to the patient. Pain has been managed and has no complaints prior to discharge. Patient is comfortable with above plan and is stable for discharge at this time.  All questions were answered prior to disposition. Strict return precautions for returning to the ED were discussed. Encouraged follow up with PCP.   An After Visit Summary was printed and given to the patient.   Portions of this note were generated with Lobbyist. Dictation errors may occur despite best attempts at proofreading.  Final Clinical Impressions(s) / ED Diagnoses   Final diagnoses:  Cyclical vomiting syndrome    ED Discharge Orders    None       Delia Heady, PA-C 04/29/19 1223    Maudie Flakes, MD 05/02/19 [phone]

## 2019-04-29 NOTE — ED Notes (Signed)
ED Provider at bedside. 

## 2019-04-29 NOTE — ED Notes (Signed)
ED Provider at bedside, Dr. Sedonia Small.

## 2019-04-29 NOTE — ED Notes (Signed)
Pt verbalized understanding of dc instructions.

## 2019-04-30 LAB — URINE CULTURE: Culture: >=100000 — AB

## 2019-05-01 ENCOUNTER — Telehealth (HOSPITAL_BASED_OUTPATIENT_CLINIC_OR_DEPARTMENT_OTHER): Payer: Self-pay

## 2019-05-01 NOTE — Telephone Encounter (Signed)
Post ED Visit - Positive Culture Follow-up  Culture report reviewed by antimicrobial stewardship pharmacist: [place] Team []  Elenor Quinones, Pharm.D. []  Heide Guile, Pharm.D., BCPS AQ-ID []  Parks Neptune, Pharm.D., BCPS []  Alycia Rossetti, Pharm.D., BCPS []  [place], [place].D., BCPS, AAHIVP []  Legrand Como, Pharm.D., BCPS, AAHIVP []  Salome Arnt, PharmD, BCPS []  Johnnette Gourd, PharmD, BCPS []  Hughes Better, PharmD, BCPS []  Leeroy Cha, PharmD []  Laqueta Linden, PharmD, BCPS []  Albertina Parr, PharmD X Mimi Pham, [place] Team []  Leodis Sias, PharmD []  Lindell Spar, PharmD []  Royetta Asal, PharmD []  Graylin Shiver, Rph []  Rema Fendt) Glennon Mac, PharmD []  Arlyn Dunning, PharmD []  Netta Cedars, PharmD []  Dia Sitter, PharmD []  Leone Haven, PharmD []  Gretta Arab, PharmD []  Theodis Shove, PharmD []  Peggyann Juba, PharmD []  Reuel Boom, PharmD   Positive urine culture >/= 100,000 colonies -> E Coli Treated with Cephalexin, organism sensitive to the same and no further patient follow-up is required at this time.  Dortha Kern 05/01/2019, 9:53 AM

## 2019-05-08 ENCOUNTER — Emergency Department (HOSPITAL_BASED_OUTPATIENT_CLINIC_OR_DEPARTMENT_OTHER)
Admission: EM | Admit: 2019-05-08 | Discharge: 2019-05-08 | Disposition: A | Discharge status: Home / Self Care | Payer: Medicaid Other | Attending: Emergency Medicine | Admitting: Emergency Medicine

## 2019-05-08 ENCOUNTER — Other Ambulatory Visit: Payer: Self-pay

## 2019-05-08 ENCOUNTER — Encounter (HOSPITAL_BASED_OUTPATIENT_CLINIC_OR_DEPARTMENT_OTHER): Payer: Self-pay | Admitting: *Deleted

## 2019-05-08 DIAGNOSIS — R1084 Generalized abdominal pain: Secondary | ICD-10-CM | POA: Diagnosis present

## 2019-05-08 DIAGNOSIS — Z79899 Other long term (current) drug therapy: Secondary | ICD-10-CM | POA: Diagnosis not present

## 2019-05-08 DIAGNOSIS — Z3202 Encounter for pregnancy test, result negative: Secondary | ICD-10-CM | POA: Insufficient documentation

## 2019-05-08 DIAGNOSIS — I10 Essential (primary) hypertension: Secondary | ICD-10-CM | POA: Diagnosis not present

## 2019-05-08 DIAGNOSIS — Z91010 Allergy to peanuts: Secondary | ICD-10-CM | POA: Insufficient documentation

## 2019-05-08 DIAGNOSIS — R109 Unspecified abdominal pain: Secondary | ICD-10-CM

## 2019-05-08 DIAGNOSIS — F17210 Nicotine dependence, cigarettes, uncomplicated: Secondary | ICD-10-CM | POA: Diagnosis not present

## 2019-05-08 LAB — URINALYSIS, ROUTINE W REFLEX MICROSCOPIC
Bilirubin Urine: NEGATIVE
Glucose, UA: NEGATIVE mg/dL
Hgb urine dipstick: NEGATIVE
Ketones, ur: NEGATIVE mg/dL
Leukocytes,Ua: NEGATIVE
Nitrite: NEGATIVE
Protein, ur: NEGATIVE mg/dL
Specific Gravity, Urine: 1.01 (ref 1.005–1.030)
pH: 8.5 — ABNORMAL HIGH (ref 5.0–8.0)

## 2019-05-08 LAB — PREGNANCY, URINE: Preg Test, Ur: NEGATIVE

## 2019-05-08 MED ORDER — PROMETHAZINE HCL 25 MG PO TABS
12.5000 mg | ORAL_TABLET | Freq: Once | ORAL | Status: AC
  Administered 2019-05-08: 12.5 mg via ORAL
  Filled 2019-05-08: qty 1

## 2019-05-08 MED ORDER — DIPHENHYDRAMINE HCL 25 MG PO CAPS
25.0000 mg | ORAL_CAPSULE | Freq: Once | ORAL | Status: AC
  Administered 2019-05-08: 14:00:00 25 mg via ORAL
  Filled 2019-05-08: qty 1

## 2019-05-08 MED ORDER — HYDROMORPHONE HCL 1 MG/ML IJ SOLN
1.0000 mg | Freq: Once | INTRAMUSCULAR | Status: AC
  Administered 2019-05-08: 1 mg via INTRAMUSCULAR
  Filled 2019-05-08: qty 1

## 2019-05-08 MED ORDER — ONDANSETRON 4 MG PO TBDP
4.0000 mg | ORAL_TABLET | Freq: Once | ORAL | Status: DC
  Filled 2019-05-08: qty 1

## 2019-05-08 NOTE — ED Provider Notes (Signed)
[facility] EMERGENC[facility] DEPARTMENT Provider Note   CSN: [id number] Arrival date & time: 05/08/19  1145     History   Chief Complaint Chief Complaint  Patient presents with  . Abdominal Pain  . Diarrhea    HPI Sherry Key is a 33 y.o. female.     The history is provided by the patient.  Abdominal Pain Pain location:  Generalized Pain quality: bloating and cramping   Pain radiates to:  Does not radiate Pain severity:  Mild Onset quality:  Gradual Timing:  Intermittent Progression:  Waxing and waning Chronicity:  Chronic Context: not eating   Relieved by:  Nothing Worsened by:  Nothing Associated symptoms: no chest pain, no chills, no cough, no dysuria, no fever, no hematuria, no shortness of breath, no sore throat and no vomiting   Risk factors: has not had multiple surgeries and not pregnant     Past Medical History:  Diagnosis Date  . Crohn's disease ([place])   . Cyclical vomiting   . Endometriosis   . Fibroids   . Hypertension   . PCOS (polycystic ovarian syndrome)     There are no active problems to display for this patient.   Past Surgical History:  Procedure Laterality Date  . CESAREAN SECTION    . CESAREAN SECTION    . CHOLECYSTECTOMY    . HERNIA REPAIR    . TONSILLECTOMY    . TUBAL LIGATION    . TUBAL LIGATION       OB History   No obstetric history on file.      Home Medications    Prior to Admission medications   Medication Sig Start Date End Date Taking? Authorizing Provider  Elagolix Sodium (ORILISSA) 200 MG TABS Take by mouth.    [provider]  flintstones complete (FLINTSTONES) 60 MG chewable tablet Chew 1 tablet by mouth daily.      [provider]  GABAPENTIN PO Take 1 capsule 3 (three) times daily by mouth.    [provider]  ibuprofen (ADVIL,MOTRIN) 800 MG tablet Take 1 tablet (800 mg total) 3 (three) times daily by mouth. 05/25/17   Mesner, Corene Cornea, MD  LABETALOL HCL PO Take 1 tablet  2 (two) times daily by mouth.    [provider]  pantoprazole (PROTONIX) 40 MG tablet Take by mouth. 10/14/18   [provider]  promethazine (PHENERGAN) 25 MG suppository Place 1 suppository (25 mg total) rectally every 6 (six) hours as needed for nausea or vomiting. 04/15/19   Tedd Sias, PA    Family History No family history on file.  Social History Social History   Tobacco Use  . Smoking status: Current Every Day Smoker    Packs/day: 0.50    Types: Cigarettes  . Smokeless tobacco: Never Used  Substance Use Topics  . Alcohol use: No    Frequency: Never  . Drug use: No     Allergies   Dicyclomine, Morphine, Percocet [oxycodone-acetaminophen], Zofran [ondansetron hcl], Other, Peanut-containing drug products, Percocet [oxycodone-acetaminophen], Tomato, Zofran, and Haloperidol   Review of Systems Review of Systems  Constitutional: Negative for chills and fever.  HENT: Negative for ear pain and sore throat.   Eyes: Negative for pain and visual disturbance.  Respiratory: Negative for cough and shortness of breath.   Cardiovascular: Negative for chest pain and palpitations.  Gastrointestinal: Positive for abdominal pain. Negative for vomiting.  Genitourinary: Negative for dysuria and hematuria.  Musculoskeletal: Negative for arthralgias and back pain.  Skin: Negative for color change and rash.  Neurological: Negative for seizures and syncope.  All other systems reviewed and are negative.    Physical Exam Updated Vital Signs BP (!) 139/95   Pulse 98   Temp 98.9 F (37.2 C) (Oral)   Resp 16   Ht 5' 3"  (1.6 m)   Wt 65.8 kg   LMP 04/14/2019 Comment: (-) urine pregnancy//ac  SpO2 100%   BMI 25.70 kg/m   Physical Exam Vitals signs and nursing note reviewed.  Constitutional:      General: She is not in acute distress.    Appearance: She is well-developed. She is not ill-appearing.  HENT:     Head: Normocephalic and atraumatic.  Eyes:      Conjunctiva/sclera: Conjunctivae normal.  Neck:     Musculoskeletal: Neck supple.  Cardiovascular:     Rate and Rhythm: Normal rate and regular rhythm.     Heart sounds: Normal heart sounds. No murmur.  Pulmonary:     Effort: Pulmonary effort is normal. No respiratory distress.     Breath sounds: Normal breath sounds.  Abdominal:     General: Abdomen is flat.     Palpations: Abdomen is soft.     Tenderness: There is no abdominal tenderness. There is no right CVA tenderness, left CVA tenderness, guarding or rebound. Negative signs include Murphy's sign, Rovsing's sign and psoas sign.  Skin:    General: Skin is warm and dry.  Neurological:     Mental Status: She is alert.      ED Treatments / Results  Labs (all labs ordered are listed, but only abnormal results are displayed) Labs Reviewed  URINALYSIS, ROUTINE W REFLEX MICROSCOPIC - Abnormal; Notable for the following components:      Result Value   pH 8.5 (*)    All other components within normal limits  PREGNANCY, URINE    EKG None  Radiology No results found.  Procedures Procedures (including critical care time)  Medications Ordered in ED Medications  promethazine (PHENERGAN) tablet 12.5 mg (has no administration in time range)  HYDROmorphone (DILAUDID) injection 1 mg (1 mg Intramuscular Given 05/08/19 1343)  diphenhydrAMINE (BENADRYL) capsule 25 mg (25 mg Oral Given 05/08/19 1343)     Initial Impression / Assessment and Plan / ED Course  I have reviewed the triage vital signs and the nursing notes.  Pertinent labs & imaging results that were available during my care of the patient were reviewed by me and considered in my medical decision making (see chart for details).     Sherry Key is a 33 year old female with history of chronic abdominal pain, fibroids who presents to the ED with abdominal pain.  Patient with normal vitals.  No fever.  Has no real focal abdominal tenderness on exam.  No concern for  appendicitis or other intra-abdominal surgical process.  States that she is having some diarrhea.  She states that her chronic pain medicines at home helped much.  She was requesting some breakthrough pain medication which she states that she gets at times.  She is awaiting gynecology consultation to talk about hysterectomy.  She is not having any vaginal bleeding.  She states that she has her pain medications at home.  Has nausea medicines at home.  Overall urinalysis showed no signs of infection.  Pregnancy test was negative.  Likely acute on chronic pain with slight breakthrough today.  Offered her lab work and further evaluation but she would like to just have some  pain medication and discharge at this time.  I believe that is reasonable as it does not appear to be any acute emergencies at this time.  Understands return precautions and discharged from ED in good condition.  This chart was dictated using voice recognition software.  Despite best efforts to proofread,  errors can occur which can change the documentation meaning.     Final Clinical Impressions(s) / ED Diagnoses   Final diagnoses:  Abdominal pain, unspecified abdominal location    ED Discharge Orders    None       Lennice Sites, DO 05/08/19 1350

## 2019-05-08 NOTE — ED Triage Notes (Signed)
Abdominal pain and diarrhea x 3 days.

## 2019-06-12 ENCOUNTER — Emergency Department (HOSPITAL_BASED_OUTPATIENT_CLINIC_OR_DEPARTMENT_OTHER)
Admission: EM | Admit: 2019-06-12 | Discharge: 2019-06-12 | Disposition: A | Discharge status: Home / Self Care | Payer: Medicaid Other | Attending: Emergency Medicine | Admitting: Emergency Medicine

## 2019-06-12 ENCOUNTER — Other Ambulatory Visit: Payer: Self-pay

## 2019-06-12 DIAGNOSIS — Z5321 Procedure and treatment not carried out due to patient leaving prior to being seen by health care provider: Secondary | ICD-10-CM | POA: Diagnosis not present

## 2019-06-12 DIAGNOSIS — R112 Nausea with vomiting, unspecified: Secondary | ICD-10-CM | POA: Insufficient documentation

## 2019-06-12 NOTE — ED Triage Notes (Addendum)
Pt with n/v x 2 days-hx of multiple ED visits for cyclic vomiting-pt vomiting in triage-to triage in w/c

## 2019-06-12 NOTE — Progress Notes (Signed)
  TOC CM reviewed chart: 9 ED visits in 6 months  Attempted call to pt and left message for return call. Pt has PCP, but has return visits for nausea and vomiting. Will follow up to see if she continues to go to [facility] for her PCP, and did she follow up with her OB-Gyn. Per pt notes she is waiting to have a hysterectomy. [place], [place] ED TOC CM [phone]

## 2019-07-27 DIAGNOSIS — J982 Interstitial emphysema: Secondary | ICD-10-CM | POA: Insufficient documentation

## 2019-07-28 DIAGNOSIS — D649 Anemia, unspecified: Secondary | ICD-10-CM | POA: Insufficient documentation

## 2019-09-09 ENCOUNTER — Emergency Department (HOSPITAL_BASED_OUTPATIENT_CLINIC_OR_DEPARTMENT_OTHER)
Admission: EM | Admit: 2019-09-09 | Discharge: 2019-09-09 | Disposition: A | Discharge status: Home / Self Care | Payer: Medicaid Other | Attending: Emergency Medicine | Admitting: Emergency Medicine

## 2019-09-09 ENCOUNTER — Encounter (HOSPITAL_BASED_OUTPATIENT_CLINIC_OR_DEPARTMENT_OTHER): Payer: Self-pay | Admitting: Oncology

## 2019-09-09 ENCOUNTER — Other Ambulatory Visit: Payer: Self-pay

## 2019-09-09 DIAGNOSIS — I10 Essential (primary) hypertension: Secondary | ICD-10-CM | POA: Insufficient documentation

## 2019-09-09 DIAGNOSIS — E876 Hypokalemia: Secondary | ICD-10-CM | POA: Insufficient documentation

## 2019-09-09 DIAGNOSIS — R1084 Generalized abdominal pain: Secondary | ICD-10-CM | POA: Insufficient documentation

## 2019-09-09 DIAGNOSIS — Z888 Allergy status to other drugs, medicaments and biological substances status: Secondary | ICD-10-CM | POA: Insufficient documentation

## 2019-09-09 DIAGNOSIS — Z79899 Other long term (current) drug therapy: Secondary | ICD-10-CM | POA: Diagnosis not present

## 2019-09-09 DIAGNOSIS — Z881 Allergy status to other antibiotic agents status: Secondary | ICD-10-CM | POA: Diagnosis not present

## 2019-09-09 DIAGNOSIS — F17210 Nicotine dependence, cigarettes, uncomplicated: Secondary | ICD-10-CM | POA: Insufficient documentation

## 2019-09-09 DIAGNOSIS — Z885 Allergy status to narcotic agent status: Secondary | ICD-10-CM | POA: Diagnosis not present

## 2019-09-09 DIAGNOSIS — R112 Nausea with vomiting, unspecified: Secondary | ICD-10-CM | POA: Diagnosis present

## 2019-09-09 DIAGNOSIS — R1115 Cyclical vomiting syndrome unrelated to migraine: Secondary | ICD-10-CM | POA: Diagnosis not present

## 2019-09-09 LAB — COMPREHENSIVE METABOLIC PANEL
ALT: 14 U/L (ref 0–44)
AST: 20 U/L (ref 15–41)
Albumin: 4.5 g/dL (ref 3.5–5.0)
Alkaline Phosphatase: 60 U/L (ref 38–126)
Anion gap: 12 (ref 5–15)
BUN: 12 mg/dL (ref 6–20)
CO2: 21 mmol/L — ABNORMAL LOW (ref 22–32)
Calcium: 9.6 mg/dL (ref 8.9–10.3)
Chloride: 104 mmol/L (ref 98–111)
Creatinine, Ser: 0.74 mg/dL (ref 0.44–1.00)
GFR calc Af Amer: >60 mL/min (ref >60)
GFR calc non Af Amer: >60 mL/min (ref >60)
Glucose, Bld: 114 mg/dL — ABNORMAL HIGH (ref 70–99)
Potassium: 3.3 mmol/L — ABNORMAL LOW (ref 3.5–5.1)
Sodium: 137 mmol/L (ref 135–145)
Total Bilirubin: 0.7 mg/dL (ref 0.3–1.2)
Total Protein: 8.6 g/dL — ABNORMAL HIGH (ref 6.5–8.1)

## 2019-09-09 LAB — CBC WITH DIFFERENTIAL/PLATELET
Abs Immature Granulocytes: 0.07 10*3/uL (ref 0.00–0.07)
Basophils Absolute: 0.1 10*3/uL (ref 0.0–0.1)
Basophils Relative: 0 %
Eosinophils Absolute: 0.1 10*3/uL (ref 0.0–0.5)
Eosinophils Relative: 1 %
HCT: 36.3 % (ref 36.0–46.0)
Hemoglobin: 11.7 g/dL — ABNORMAL LOW (ref 12.0–15.0)
Immature Granulocytes: 1 %
Lymphocytes Relative: 12 %
Lymphs Abs: 1.7 10*3/uL (ref 0.7–4.0)
MCH: 29 pg (ref 26.0–34.0)
MCHC: 32.2 g/dL (ref 30.0–36.0)
MCV: 90.1 fL (ref 80.0–100.0)
Monocytes Absolute: 1 10*3/uL (ref 0.1–1.0)
Monocytes Relative: 7 %
Neutro Abs: 11.1 10*3/uL — ABNORMAL HIGH (ref 1.7–7.7)
Neutrophils Relative %: 79 %
Platelets: 370 10*3/uL (ref 150–400)
RBC: 4.03 MIL/uL (ref 3.87–5.11)
RDW: 16 % — ABNORMAL HIGH (ref 11.5–15.5)
WBC: 14 10*3/uL — ABNORMAL HIGH (ref 4.0–10.5)
nRBC: 0 % (ref 0.0–0.2)

## 2019-09-09 LAB — URINALYSIS, ROUTINE W REFLEX MICROSCOPIC
Bilirubin Urine: NEGATIVE
Glucose, UA: NEGATIVE mg/dL
Ketones, ur: 40 mg/dL — AB
Nitrite: NEGATIVE
Protein, ur: NEGATIVE mg/dL
Specific Gravity, Urine: 1.02 (ref 1.005–1.030)
pH: 6.5 (ref 5.0–8.0)

## 2019-09-09 LAB — URINALYSIS, MICROSCOPIC (REFLEX)

## 2019-09-09 LAB — LIPASE, BLOOD: Lipase: 22 U/L (ref 11–51)

## 2019-09-09 LAB — PREGNANCY, URINE: Preg Test, Ur: NEGATIVE

## 2019-09-09 MED ORDER — HYDROMORPHONE HCL 1 MG/ML IJ SOLN
INTRAMUSCULAR | Status: AC
  Administered 2019-09-09: 1 mg via INTRAVENOUS
  Filled 2019-09-09: qty 1

## 2019-09-09 MED ORDER — PROMETHAZINE HCL 25 MG/ML IJ SOLN
25.0000 mg | Freq: Once | INTRAMUSCULAR | Status: AC
  Administered 2019-09-09: 25 mg via INTRAMUSCULAR

## 2019-09-09 MED ORDER — HYDROMORPHONE HCL 1 MG/ML IJ SOLN
1.0000 mg | Freq: Once | INTRAMUSCULAR | Status: AC
  Administered 2019-09-09: 1 mg via INTRAMUSCULAR

## 2019-09-09 MED ORDER — HYDROMORPHONE HCL 1 MG/ML IJ SOLN
1.0000 mg | Freq: Once | INTRAMUSCULAR | Status: AC
  Filled 2019-09-09: qty 1

## 2019-09-09 MED ORDER — FAMOTIDINE IN NACL 20-0.9 MG/50ML-% IV SOLN
20.0000 mg | Freq: Once | INTRAVENOUS | Status: AC
  Administered 2019-09-09: 20 mg via INTRAVENOUS
  Filled 2019-09-09: qty 50

## 2019-09-09 MED ORDER — PROMETHAZINE HCL 25 MG/ML IJ SOLN
25.0000 mg | Freq: Once | INTRAMUSCULAR | Status: DC
  Filled 2019-09-09: qty 1

## 2019-09-09 MED ORDER — SODIUM CHLORIDE 0.9 % IV BOLUS
1000.0000 mL | Freq: Once | INTRAVENOUS | Status: AC
  Administered 2019-09-09: 1000 mL via INTRAVENOUS

## 2019-09-09 MED ORDER — PROMETHAZINE HCL 25 MG/ML IJ SOLN: 25.0000 mg | Freq: Once | INTRAMUSCULAR | Status: DC

## 2019-09-09 MED ORDER — POTASSIUM CHLORIDE CRYS ER 20 MEQ PO TBCR
40.0000 meq | EXTENDED_RELEASE_TABLET | Freq: Once | ORAL | Status: AC
  Administered 2019-09-09: 40 meq via ORAL
  Filled 2019-09-09: qty 2

## 2019-09-09 NOTE — ED Provider Notes (Signed)
Medical screening examination/treatment/procedure(s) were conducted as a shared visit with non-physician practitioner(s) and myself.  I personally evaluated the patient during the encounter.  EKG Interpretation  Date/Time:  Tuesday September 09 2019 19:49:28 EST Ventricular Rate:  104 PR Interval:    QRS Duration: 82 QT Interval:  335 QTC Calculation: 441 R Axis:   83 Text Interpretation: Sinus tachycardia Probable left atrial enlargement Probable left ventricular hypertrophy no change from older tracings except less pronounced inferior ST depresion inferiorly Confirmed by Charlesetta Shanks [phone]) on 09/09/2019 8:43:16 PM  Angiocath insertion Performed by: Charlesetta Shanks  Consent: Verbal consent obtained. Risks and benefits: risks, benefits and alternatives were discussed Time out: Immediately prior to procedure a "time out" was called to verify the correct patient, procedure, equipment, support staff and site/side marked as required.  Preparation: Patient was prepped and draped in the usual sterile fashion.  Vein Location: right basilic  Yes Ultrasound Guided  Gauge: 21 long  Normal blood return and flush without difficulty Patient tolerance: Patient tolerated the procedure well with no immediate complications.  Patient has had recurrent vomiting and dry heaves.  Is been continuous for 2 days.  Patient reports she has abdominal pain.  Patient is alert and nontoxic.  She is actively having dry heaves.  No respiratory distress.  I agree with plan of management.   Charlesetta Shanks, MD 09/09/19 [phone]

## 2019-09-09 NOTE — ED Triage Notes (Signed)
Pt c/o N/V/D and abd pain x 2 days.  Pt denies any contact w/ someone w/ similar sx.  Pt actively vomiting.

## 2019-09-09 NOTE — ED Provider Notes (Signed)
[facility] EMERGENCY DEPARTMENT Provider Note   CSN: [id number] Arrival date & time: 09/09/19  1907     History Chief Complaint  Patient presents with  . Emesis    Sherry Key is a 34 y.o. female.  Sherry Key is a 34 y.o. female with a history of cyclical vomiting, endometriosis, PCOS, fibroids, Crohn's disease, and hypertension, who presents to the emergency department for evaluation of 2 days of persistent vomiting, diarrhea and generalized abdominal pain.  She states this feels exactly like her usual episodes of cyclical vomiting syndrome that usually occur about a week before her menstrual cycle.  She states that she has been persistently vomiting for the past 2 days and has not been able to keep anything down.  She states that she was not able to keep down her home Phenergan or pain medications to manage symptoms.  She reports generalized pain throughout the abdomen that is not localized to one area.  She denies any hematemesis.  Reports some intermittent diarrhea but no blood in the stools.  Denies any dysuria or urinary frequency.  No vaginal bleeding or vaginal discharge.  She denies any chills does have history of a previous episode of pneumomediastinum related to her chronic vomiting but there was no esophageal perforation found.  Patient has not noticed any pain or area around her chest or neck.  No other aggravating or alleviating factors.        Past Medical History:  Diagnosis Date  . Crohn's disease ([place])   . Cyclical vomiting   . Endometriosis   . Fibroids   . Hypertension   . PCOS (polycystic ovarian syndrome)     There are no problems to display for this patient.   Past Surgical History:  Procedure Laterality Date  . CESAREAN SECTION    . CESAREAN SECTION    . CHOLECYSTECTOMY    . HERNIA REPAIR    . TONSILLECTOMY    . TUBAL LIGATION    . TUBAL LIGATION       OB History   No obstetric history on file.     No family history  on file.  Social History   Tobacco Use  . Smoking status: Current Every Day Smoker    Packs/day: 0.50    Types: Cigarettes  . Smokeless tobacco: Never Used  Substance Use Topics  . Alcohol use: No  . Drug use: No    Home Medications Prior to Admission medications   Medication Sig Start Date End Date Taking? Authorizing Provider  Elagolix Sodium (ORILISSA) 200 MG TABS Take by mouth.    [provider]  flintstones complete (FLINTSTONES) 60 MG chewable tablet Chew 1 tablet by mouth daily.      [provider]  GABAPENTIN PO Take 1 capsule 3 (three) times daily by mouth.    [provider]  ibuprofen (ADVIL,MOTRIN) 800 MG tablet Take 1 tablet (800 mg total) 3 (three) times daily by mouth. 05/25/17   Mesner, Corene Cornea, MD  LABETALOL HCL PO Take 1 tablet 2 (two) times daily by mouth.    [provider]  pantoprazole (PROTONIX) 40 MG tablet Take by mouth. 10/14/18   [provider]  promethazine (PHENERGAN) 25 MG suppository Place 1 suppository (25 mg total) rectally every 6 (six) hours as needed for nausea or vomiting. 04/15/19   Tedd Sias, PA    Allergies    Dicyclomine, Morphine, Percocet [oxycodone-acetaminophen], Zofran [ondansetron hcl], Other, Peanut-containing drug products, Percocet [oxycodone-acetaminophen], Tomato,  Zofran, and Haloperidol  Review of Systems   Review of Systems  Constitutional: Negative for chills and fever.  HENT: Negative.   Respiratory: Negative for cough and shortness of breath.   Cardiovascular: Negative for chest pain.  Gastrointestinal: Positive for abdominal pain, diarrhea, nausea and vomiting. Negative for constipation.  Genitourinary: Negative for dysuria, frequency, vaginal bleeding and vaginal discharge.  Musculoskeletal: Negative for arthralgias and myalgias.  Skin: Negative for color change and rash.  All other systems reviewed and are negative.   Physical Exam Updated Vital Signs BP (!)  173/129 (BP Location: Right Arm)   Pulse (!) 108   Temp 98.8 F (37.1 C) (Oral)   Resp 20   Ht 5' 3"  (1.6 m)   Wt 58.5 kg   LMP 08/24/2019 (Approximate)   SpO2 100%   BMI 22.85 kg/m   Physical Exam Vitals and nursing note reviewed.  Constitutional:      Appearance: She is well-developed. She is ill-appearing. She is not diaphoretic.     Comments: Patient ill-appearing, actively vomiting  HENT:     Head: Normocephalic and atraumatic.     Mouth/Throat:     Comments: Mucous membranes dry Eyes:     General:        Right eye: No discharge.        Left eye: No discharge.  Cardiovascular:     Rate and Rhythm: Regular rhythm. Tachycardia present.     Heart sounds: Normal heart sounds. No murmur. No gallop.      Comments: Mildly tachycardic with regular rhythm Pulmonary:     Effort: Pulmonary effort is normal. No respiratory distress.     Breath sounds: Normal breath sounds. No wheezing or rales.     Comments: Respirations equal and unlabored, patient able to speak in full sentences, lungs clear to auscultation bilaterally Abdominal:     General: Bowel sounds are normal. There is no distension.     Palpations: Abdomen is soft. There is no mass.     Tenderness: There is abdominal tenderness. There is no guarding.     Comments: Abdomen is soft and nondistended, bowel sounds are present throughout, generalized tenderness throughout the abdomen noted with some voluntary guarding.  Musculoskeletal:        General: No deformity.     Cervical back: Neck supple.  Skin:    General: Skin is warm and dry.     Capillary Refill: Capillary refill takes less than 2 seconds.  Neurological:     Mental Status: She is alert.     Coordination: Coordination normal.     Comments: Speech is clear, able to follow commands Moves extremities without ataxia, coordination intact  Psychiatric:        Mood and Affect: Mood normal.        Behavior: Behavior normal.     ED Results / Procedures /  Treatments   Labs (all labs ordered are listed, but only abnormal results are displayed) Labs Reviewed  COMPREHENSIVE METABOLIC PANEL - Abnormal; Notable for the following components:      Result Value   Potassium 3.3 (*)    CO2 21 (*)    Glucose, Bld 114 (*)    Total Protein 8.6 (*)    All other components within normal limits  CBC WITH DIFFERENTIAL/PLATELET - Abnormal; Notable for the following components:   WBC 14.0 (*)    Hemoglobin 11.7 (*)    RDW 16.0 (*)    Neutro Abs 11.1 (*)  All other components within normal limits  URINALYSIS, ROUTINE W REFLEX MICROSCOPIC - Abnormal; Notable for the following components:   Hgb urine dipstick TRACE (*)    Ketones, ur 40 (*)    Leukocytes,Ua TRACE (*)    All other components within normal limits  URINALYSIS, MICROSCOPIC (REFLEX) - Abnormal; Notable for the following components:   Bacteria, UA RARE (*)    All other components within normal limits  LIPASE, BLOOD  PREGNANCY, URINE    EKG EKG Interpretation  Date/Time:  Tuesday September 09 2019 19:49:28 EST Ventricular Rate:  104 PR Interval:    QRS Duration: 82 QT Interval:  335 QTC Calculation: 441 R Axis:   83 Text Interpretation: Sinus tachycardia Probable left atrial enlargement Probable left ventricular hypertrophy no change from older tracings except less pronounced inferior ST depresion inferiorly Confirmed by Charlesetta Shanks [phone]) on 09/09/2019 8:43:16 PM   Radiology No results found.  Procedures Procedures (including critical care time)  Medications Ordered in ED Medications  sodium chloride 0.9 % bolus 1,000 mL (1,000 mLs Intravenous New Bag/Given 09/09/19 2152)  HYDROmorphone (DILAUDID) injection 1 mg (1 mg Intravenous Given 09/09/19 2154)  famotidine (PEPCID) IVPB 20 mg premix ( Intravenous Stopped 09/09/19 2225)  HYDROmorphone (DILAUDID) injection 1 mg (1 mg Intramuscular Given 09/09/19 2127)  promethazine (PHENERGAN) injection 25 mg (25 mg Intramuscular Given 09/09/19  2126)  potassium chloride SA (KLOR-CON) CR tablet 40 mEq (40 mEq Oral Given 09/09/19 2243)    ED Course  I have reviewed the triage vital signs and the nursing notes.  Pertinent labs & imaging results that were available during my care of the patient were reviewed by me and considered in my medical decision making (see chart for details).    MDM Rules/Calculators/A&P                     34 year old female presents with 2 days of persistent nausea and vomiting with generalized abdominal pain and some intermittent diarrhea.  Symptoms are consistent with her previous episodes of cyclical vomiting.  She was unable to keep down medications at home to manage her symptoms.  Will check abdominal labs and EKG and give IV fluids, Phenergan, Dilaudid and Pepcid.  Patient with generalized pain that is nonfocal, at this time I do not feel abdominal imaging is indicated will repeat exam after medications.  Note labs shows leukocytosis of 14.0, likely an acute stress reaction, potassium of 3.3, oral potassium given after vomiting has ceased, CO2 of 21 which appears to be at patient's baseline, glucose of 114, normal renal and liver function normal lipase.  Negative pregnancy.  Urinalysis with trace leukocytes but no other signs of infection and no urinary symptoms.  IV fluids delay due to difficulty with IV access but patient was given IM pain medication and Phenergan with significant improvement in her symptoms.  She was given 1 L IV fluid bolus and IV Pepcid.  On reevaluation she has no focal abdominal tenderness.  She is now able to tolerate water and was able to keep down p.o. potassium and reports she is feeling much better.  Patient will be discharged home.  She already has prescriptions for pain and nausea medicine waiting for her.  She was seen by GI recently and has been referred to [place] for further evaluation.  I discussed return precautions.  Patient expresses understanding.  Discharged home in good  condition.  Final Clinical Impression(s) / ED Diagnoses Final diagnoses:  Non-intractable cyclical vomiting with  nausea  Generalized abdominal pain  Hypokalemia    Rx / DC Orders ED Discharge Orders    None       Janet Berlin 09/09/19 2317    Charlesetta Shanks, MD 09/09/19 2342

## 2019-09-09 NOTE — Discharge Instructions (Signed)
I am glad we were able to get her symptoms under control today.  Please pick up the medications prescribed to you by your GI doctor.  Make sure you are drinking plenty of fluids and try and eat small frequent meals.  Return to the emergency department for any new or concerning symptoms.

## 2019-09-09 NOTE — ED Notes (Signed)
Tried unsuccessfully X 2 with ultrasound to insert IV catheter. Both attempts unable to thread catheter into vein efficiently. Patient tolerated well. PA notified

## 2019-10-14 ENCOUNTER — Other Ambulatory Visit: Payer: Self-pay

## 2019-10-14 ENCOUNTER — Encounter (HOSPITAL_BASED_OUTPATIENT_CLINIC_OR_DEPARTMENT_OTHER): Payer: Self-pay | Admitting: Emergency Medicine

## 2019-10-14 ENCOUNTER — Emergency Department (HOSPITAL_BASED_OUTPATIENT_CLINIC_OR_DEPARTMENT_OTHER)
Admission: EM | Admit: 2019-10-14 | Discharge: 2019-10-14 | Disposition: A | Discharge status: Home / Self Care | Payer: Medicaid Other | Attending: Emergency Medicine | Admitting: Emergency Medicine

## 2019-10-14 ENCOUNTER — Emergency Department (HOSPITAL_BASED_OUTPATIENT_CLINIC_OR_DEPARTMENT_OTHER): Payer: Medicaid Other

## 2019-10-14 DIAGNOSIS — R109 Unspecified abdominal pain: Secondary | ICD-10-CM | POA: Diagnosis present

## 2019-10-14 DIAGNOSIS — R0789 Other chest pain: Secondary | ICD-10-CM | POA: Insufficient documentation

## 2019-10-14 DIAGNOSIS — Z87891 Personal history of nicotine dependence: Secondary | ICD-10-CM | POA: Insufficient documentation

## 2019-10-14 DIAGNOSIS — K5090 Crohn's disease, unspecified, without complications: Secondary | ICD-10-CM | POA: Diagnosis not present

## 2019-10-14 DIAGNOSIS — Z888 Allergy status to other drugs, medicaments and biological substances status: Secondary | ICD-10-CM | POA: Insufficient documentation

## 2019-10-14 DIAGNOSIS — I10 Essential (primary) hypertension: Secondary | ICD-10-CM | POA: Diagnosis not present

## 2019-10-14 DIAGNOSIS — Z885 Allergy status to narcotic agent status: Secondary | ICD-10-CM | POA: Diagnosis not present

## 2019-10-14 DIAGNOSIS — Z79899 Other long term (current) drug therapy: Secondary | ICD-10-CM | POA: Diagnosis not present

## 2019-10-14 DIAGNOSIS — R112 Nausea with vomiting, unspecified: Secondary | ICD-10-CM | POA: Insufficient documentation

## 2019-10-14 DIAGNOSIS — Z91010 Allergy to peanuts: Secondary | ICD-10-CM | POA: Diagnosis not present

## 2019-10-14 LAB — COMPREHENSIVE METABOLIC PANEL
ALT: 16 U/L (ref 0–44)
AST: 25 U/L (ref 15–41)
Albumin: 5 g/dL (ref 3.5–5.0)
Alkaline Phosphatase: 67 U/L (ref 38–126)
Anion gap: 17 — ABNORMAL HIGH (ref 5–15)
BUN: 33 mg/dL — ABNORMAL HIGH (ref 6–20)
CO2: 21 mmol/L — ABNORMAL LOW (ref 22–32)
Calcium: 10.2 mg/dL (ref 8.9–10.3)
Chloride: 98 mmol/L (ref 98–111)
Creatinine, Ser: 1.08 mg/dL — ABNORMAL HIGH (ref 0.44–1.00)
GFR calc Af Amer: >60 mL/min (ref >60)
GFR calc non Af Amer: >60 mL/min (ref >60)
Glucose, Bld: 116 mg/dL — ABNORMAL HIGH (ref 70–99)
Potassium: 4 mmol/L (ref 3.5–5.1)
Sodium: 136 mmol/L (ref 135–145)
Total Bilirubin: 1 mg/dL (ref 0.3–1.2)
Total Protein: 9.5 g/dL — ABNORMAL HIGH (ref 6.5–8.1)

## 2019-10-14 LAB — CBC WITH DIFFERENTIAL/PLATELET
Abs Immature Granulocytes: 0.06 10*3/uL (ref 0.00–0.07)
Basophils Absolute: 0.1 10*3/uL (ref 0.0–0.1)
Basophils Relative: 1 %
Eosinophils Absolute: 0.1 10*3/uL (ref 0.0–0.5)
Eosinophils Relative: 1 %
HCT: 43.9 % (ref 36.0–46.0)
Hemoglobin: 14.7 g/dL (ref 12.0–15.0)
Immature Granulocytes: 1 %
Lymphocytes Relative: 15 %
Lymphs Abs: 1.6 10*3/uL (ref 0.7–4.0)
MCH: 29.6 pg (ref 26.0–34.0)
MCHC: 33.5 g/dL (ref 30.0–36.0)
MCV: 88.3 fL (ref 80.0–100.0)
Monocytes Absolute: 1 10*3/uL (ref 0.1–1.0)
Monocytes Relative: 9 %
Neutro Abs: 7.9 10*3/uL — ABNORMAL HIGH (ref 1.7–7.7)
Neutrophils Relative %: 73 %
Platelets: 416 10*3/uL — ABNORMAL HIGH (ref 150–400)
RBC: 4.97 MIL/uL (ref 3.87–5.11)
RDW: 15.3 % (ref 11.5–15.5)
WBC: 10.7 10*3/uL — ABNORMAL HIGH (ref 4.0–10.5)
nRBC: 0 % (ref 0.0–0.2)

## 2019-10-14 LAB — URINALYSIS, ROUTINE W REFLEX MICROSCOPIC
Glucose, UA: NEGATIVE mg/dL
Ketones, ur: 15 mg/dL — AB
Leukocytes,Ua: NEGATIVE
Nitrite: NEGATIVE
Protein, ur: NEGATIVE mg/dL
Specific Gravity, Urine: 1.015 (ref 1.005–1.030)
pH: 6 (ref 5.0–8.0)

## 2019-10-14 LAB — LIPASE, BLOOD: Lipase: 19 U/L (ref 11–51)

## 2019-10-14 LAB — URINALYSIS, MICROSCOPIC (REFLEX)

## 2019-10-14 LAB — PREGNANCY, URINE: Preg Test, Ur: NEGATIVE

## 2019-10-14 MED ORDER — DROPERIDOL 2.5 MG/ML IJ SOLN
1.2500 mg | Freq: Once | INTRAMUSCULAR | Status: DC
  Filled 2019-10-14: qty 2

## 2019-10-14 MED ORDER — HYDROMORPHONE HCL 1 MG/ML IJ SOLN
INTRAMUSCULAR | Status: AC
  Filled 2019-10-14: qty 1

## 2019-10-14 MED ORDER — PROMETHAZINE HCL 25 MG/ML IJ SOLN
12.5000 mg | Freq: Once | INTRAMUSCULAR | Status: AC
  Administered 2019-10-14: 12.5 mg via INTRAVENOUS

## 2019-10-14 MED ORDER — PROMETHAZINE HCL 25 MG/ML IJ SOLN
INTRAMUSCULAR | Status: AC
  Filled 2019-10-14: qty 1

## 2019-10-14 MED ORDER — FENTANYL CITRATE (PF) 100 MCG/2ML IJ SOLN
50.0000 ug | Freq: Once | INTRAMUSCULAR | Status: DC
  Filled 2019-10-14: qty 2

## 2019-10-14 MED ORDER — HYDROMORPHONE HCL 1 MG/ML IJ SOLN
1.0000 mg | Freq: Once | INTRAMUSCULAR | Status: AC
  Administered 2019-10-14: 1 mg via INTRAVENOUS

## 2019-10-14 MED ORDER — PROMETHAZINE HCL 25 MG/ML IJ SOLN
12.5000 mg | Freq: Once | INTRAMUSCULAR | Status: AC
  Administered 2019-10-14: 12.5 mg via INTRAVENOUS
  Filled 2019-10-14: qty 1

## 2019-10-14 MED ORDER — SODIUM CHLORIDE 0.9 % IV BOLUS: 1000.0000 mL | Freq: Once | INTRAVENOUS | Status: DC

## 2019-10-14 MED ORDER — SODIUM CHLORIDE 0.9 % IV BOLUS
1000.0000 mL | Freq: Once | INTRAVENOUS | Status: AC
  Administered 2019-10-14: 1000 mL via INTRAVENOUS

## 2019-10-14 MED ORDER — HYDROMORPHONE HCL 1 MG/ML IJ SOLN
1.0000 mg | Freq: Once | INTRAMUSCULAR | Status: AC
  Administered 2019-10-14: 1 mg via INTRAVENOUS
  Filled 2019-10-14: qty 1

## 2019-10-14 NOTE — Discharge Instructions (Signed)
Recommend follow-up with your gynecologist as well as your primary care doctor.  Return to ER if you develop worsening vomiting, fever, other new concerning symptom.  Take your previously prescribed antinausea medicines as needed.

## 2019-10-14 NOTE — ED Provider Notes (Signed)
[place] EMERGENCY DEPARTMENT Provider Note   CSN: [id number] Arrival date & time: 10/14/19  7341     History Chief Complaint  Patient presents with  . Abdominal Pain    Sherry Key is a 34 y.o. female.  Presents to ER with severe abdominal pain.  Patient reports that she has history of severe vomiting, cyclical vomiting, endometriosis, Crohn's disease, fibroids.  States that symptoms, going on for the last few days but worsening over the past 24 hours.  Reports nausea is severe, unrelenting, mostly having dry heaves now.  Also having some mild chest discomfort.  Reports feels similar to when she was diagnosed with pneumomediastinum.  Reports that her pain and nausea she believes is from her endometriosis.  She has surgery scheduled with OB/GYN in mid April.  Pain is upper abdomen, sharp, stabbing, worse with vomiting.  No alleviating factors.  No dysuria, no vaginal discharge.  Additional history obtained via chart review, care everywhere, reviewed notes and imaging from [facility] January admission.  HPI     Past Medical History:  Diagnosis Date  . Crohn's disease ([place])   . Cyclical vomiting   . Endometriosis   . Fibroids   . Hypertension   . PCOS (polycystic ovarian syndrome)     There are no problems to display for this patient.   Past Surgical History:  Procedure Laterality Date  . CESAREAN SECTION    . CESAREAN SECTION    . CHOLECYSTECTOMY    . HERNIA REPAIR    . TONSILLECTOMY    . TUBAL LIGATION    . TUBAL LIGATION       OB History   No obstetric history on file.     No family history on file.  Social History   Tobacco Use  . Smoking status: Former Smoker    Packs/day: 0.50    Types: Cigarettes  . Smokeless tobacco: Never Used  Substance Use Topics  . Alcohol use: No  . Drug use: No    Home Medications Prior to Admission medications   Medication Sig Start Date End Date Taking? Authorizing Provider  propranolol ER (INDERAL LA)  60 MG 24 hr capsule Take by mouth. 10/02/19  Yes [provider]  Elagolix Sodium (ORILISSA) 200 MG TABS Take by mouth.    [provider]  flintstones complete (FLINTSTONES) 60 MG chewable tablet Chew 1 tablet by mouth daily.      [provider]  GABAPENTIN PO Take 1 capsule 3 (three) times daily by mouth.    [provider]  ibuprofen (ADVIL,MOTRIN) 800 MG tablet Take 1 tablet (800 mg total) 3 (three) times daily by mouth. 05/25/17   Mesner, Corene Cornea, MD  LABETALOL HCL PO Take 1 tablet 2 (two) times daily by mouth.    [provider]  pantoprazole (PROTONIX) 40 MG tablet Take by mouth. 10/14/18   [provider]  promethazine (PHENERGAN) 25 MG suppository Place 1 suppository (25 mg total) rectally every 6 (six) hours as needed for nausea or vomiting. 04/15/19   Tedd Sias, PA    Allergies    Dicyclomine, Morphine, Percocet [oxycodone-acetaminophen], Zofran [ondansetron hcl], Other, Peanut-containing drug products, Percocet [oxycodone-acetaminophen], Tomato, Zofran, and Haloperidol  Review of Systems   Review of Systems  Constitutional: Negative for chills and fever.  HENT: Negative for ear pain and sore throat.   Eyes: Negative for pain and visual disturbance.  Respiratory: Negative for cough and shortness of breath.   Cardiovascular: Positive for chest  pain. Negative for palpitations.  Gastrointestinal: Positive for abdominal pain and vomiting.  Genitourinary: Negative for dysuria and hematuria.  Musculoskeletal: Negative for arthralgias and back pain.  Skin: Negative for color change and rash.  Neurological: Negative for seizures and syncope.  All other systems reviewed and are negative.   Physical Exam Updated Vital Signs BP (!) 150/108   Pulse 100   Temp 98.2 F (36.8 C) (Oral)   Resp 18   Ht 5' 3"  (1.6 m)   Wt 53.1 kg   LMP 10/11/2019   SpO2 100%   BMI 20.73 kg/m   Physical Exam Vitals and nursing note reviewed.    Constitutional:      General: She is not in acute distress.    Appearance: She is well-developed.  HENT:     Head: Normocephalic and atraumatic.  Eyes:     Conjunctiva/sclera: Conjunctivae normal.  Cardiovascular:     Rate and Rhythm: Normal rate and regular rhythm.     Heart sounds: No murmur.  Pulmonary:     Effort: Pulmonary effort is normal. No respiratory distress.     Breath sounds: Normal breath sounds.  Abdominal:     Palpations: Abdomen is soft.     Comments: Soft, no rebound or guarding, there is tenderness in epigastrium  Musculoskeletal:     Cervical back: Neck supple.  Skin:    General: Skin is warm and dry.     Capillary Refill: Capillary refill takes less than 2 seconds.  Neurological:     General: No focal deficit present.     Mental Status: She is alert.  Psychiatric:        Mood and Affect: Mood normal.        Behavior: Behavior normal.     ED Results / Procedures / Treatments   Labs (all labs ordered are listed, but only abnormal results are displayed) Labs Reviewed  CBC WITH DIFFERENTIAL/PLATELET - Abnormal; Notable for the following components:      Result Value   WBC 10.7 (*)    Platelets 416 (*)    Neutro Abs 7.9 (*)    All other components within normal limits  COMPREHENSIVE METABOLIC PANEL - Abnormal; Notable for the following components:   CO2 21 (*)    Glucose, Bld 116 (*)    BUN 33 (*)    Creatinine, Ser 1.08 (*)    Total Protein 9.5 (*)    Anion gap 17 (*)    All other components within normal limits  URINALYSIS, ROUTINE W REFLEX MICROSCOPIC - Abnormal; Notable for the following components:   Hgb urine dipstick LARGE (*)    Bilirubin Urine SMALL (*)    Ketones, ur 15 (*)    All other components within normal limits  URINALYSIS, MICROSCOPIC (REFLEX) - Abnormal; Notable for the following components:   Bacteria, UA FEW (*)    All other components within normal limits  LIPASE, BLOOD  PREGNANCY, URINE    EKG None  Radiology CT  ABDOMEN PELVIS WO CONTRAST  Result Date: 10/14/2019 CLINICAL DATA:  Abdominal pain, severe vomiting, question of esophageal perforation EXAM: CT CHEST WITHOUT CONTRAST TECHNIQUE: Multidetector CT imaging of the chest was performed following the standard protocol without IV contrast. COMPARISON:  None. FINDINGS: Cardiovascular: There are no significant vascular findings, however limited due to the lack of intravenous contrast. The heart size is normal. There is no pericardial thickening or effusion. Mediastinum/Nodes: There are no enlarged mediastinal, hilar or axillary lymph nodes.No pneumomediastinum is seen.  The thyroid gland and trachea appear to be unremarkable. There is possible posterior wall thickening seen at the gastroesophageal junction, however limited due to lack of intravenous contrast. Lungs/Pleura: There are no areas consolidation. No suspicious pulmonary nodules. There is no pleural effusion. Musculoskeletal/Chest wall: There is no chest wall mass or suspicious osseous finding. No acute osseous abnormality Abdomen/pelvis: Hepatobiliary: Although limited due to the lack of intravenous contrast, normal in appearance without gross focal abnormality. The patient is status post cholecystectomy. No biliary ductal dilation. Pancreas:  Unremarkable.  No surrounding inflammatory changes. Spleen: Normal in size. Although limited due to the lack of intravenous contrast, normal in appearance. Adrenals/Urinary Tract: Both adrenal glands appear normal. The kidneys and collecting system appear normal without evidence of urinary tract calculus or hydronephrosis. Bladder is unremarkable. Stomach/Bowel: Probable mild wall thickening seen at the GE junction. There is a moderately distended stomach filled with air and fluid. The stomach, small bowel, and colon are normal in appearance. A moderate amount of rectal colonic stool is present. No inflammatory changes or obstructive findings. appendix is normal.  Vascular/Lymphatic: There are no enlarged abdominal or pelvic lymph nodes. No significant gross vascular findings are present. Reproductive: Enlarged globular uterus is seen. Other: No evidence of abdominal wall mass or hernia. Musculoskeletal: No acute or significant osseous findings. IMPRESSION: 1. Mild wall thickening seen at the GE junction, likely due to distal esophagitis. No pneumomediastinum or pneumatosis seen. 2. Mildly dilated fluid and air-filled stomach. Electronically Signed   By: Prudencio Pair M.D.   On: 10/14/2019 10:53   CT Chest Wo Contrast  Result Date: 10/14/2019 CLINICAL DATA:  Abdominal pain, severe vomiting, question of esophageal perforation EXAM: CT CHEST WITHOUT CONTRAST TECHNIQUE: Multidetector CT imaging of the chest was performed following the standard protocol without IV contrast. COMPARISON:  None. FINDINGS: Cardiovascular: There are no significant vascular findings, however limited due to the lack of intravenous contrast. The heart size is normal. There is no pericardial thickening or effusion. Mediastinum/Nodes: There are no enlarged mediastinal, hilar or axillary lymph nodes.No pneumomediastinum is seen. The thyroid gland and trachea appear to be unremarkable. There is possible posterior wall thickening seen at the gastroesophageal junction, however limited due to lack of intravenous contrast. Lungs/Pleura: There are no areas consolidation. No suspicious pulmonary nodules. There is no pleural effusion. Musculoskeletal/Chest wall: There is no chest wall mass or suspicious osseous finding. No acute osseous abnormality Abdomen/pelvis: Hepatobiliary: Although limited due to the lack of intravenous contrast, normal in appearance without gross focal abnormality. The patient is status post cholecystectomy. No biliary ductal dilation. Pancreas:  Unremarkable.  No surrounding inflammatory changes. Spleen: Normal in size. Although limited due to the lack of intravenous contrast, normal in  appearance. Adrenals/Urinary Tract: Both adrenal glands appear normal. The kidneys and collecting system appear normal without evidence of urinary tract calculus or hydronephrosis. Bladder is unremarkable. Stomach/Bowel: Probable mild wall thickening seen at the GE junction. There is a moderately distended stomach filled with air and fluid. The stomach, small bowel, and colon are normal in appearance. A moderate amount of rectal colonic stool is present. No inflammatory changes or obstructive findings. appendix is normal. Vascular/Lymphatic: There are no enlarged abdominal or pelvic lymph nodes. No significant gross vascular findings are present. Reproductive: Enlarged globular uterus is seen. Other: No evidence of abdominal wall mass or hernia. Musculoskeletal: No acute or significant osseous findings. IMPRESSION: 1. Mild wall thickening seen at the GE junction, likely due to distal esophagitis. No pneumomediastinum or pneumatosis seen. 2.  Mildly dilated fluid and air-filled stomach. Electronically Signed   By: Prudencio Pair M.D.   On: 10/14/2019 10:53    Procedures Procedures (including critical care time)  Medications Ordered in ED Medications  droperidol (INAPSINE) 2.5 MG/ML injection 1.25 mg (1.25 mg Intravenous Not Given 10/14/19 1007)  fentaNYL (SUBLIMAZE) injection 50 mcg (50 mcg Intravenous Not Given 10/14/19 1013)  sodium chloride 0.9 % bolus 1,000 mL (0 mLs Intravenous Stopped 10/14/19 1146)  HYDROmorphone (DILAUDID) injection 1 mg (1 mg Intravenous Given 10/14/19 1013)  promethazine (PHENERGAN) injection 12.5 mg (12.5 mg Intravenous Given 10/14/19 1009)  promethazine (PHENERGAN) injection 12.5 mg (12.5 mg Intravenous Given 10/14/19 1214)  HYDROmorphone (DILAUDID) injection 1 mg (1 mg Intravenous Given 10/14/19 1214)    ED Course  I have reviewed the triage vital signs and the nursing notes.  Pertinent labs & imaging results that were available during my care of the patient were reviewed by me and  considered in my medical decision making (see chart for details).  Clinical Course as of Oct 13 1300  Tue Oct 14, 2019  1100 Rechecked patient, marked improvement in symptoms, still with some residual nausea/vomiting, will give another round of pain/nausea meds and reassess   [RD]  1249 Recheck - updated on results, still doing very well, has nausea meds at home   [RD]    Clinical Course User Index [RD] Lucrezia Starch, MD   MDM Rules/Calculators/A&P                      34 year old lady who presented to ER with severe epigastric pain, nausea and vomiting.  Initially patient appeared uncomfortable but not in distress.  Abdomen noted to have some epigastric tenderness but no rebound or guarding.  No crepitus appreciated over her abdomen or chest.  She has long history of cyclic vomiting.  Had admission for pneumomediastinum in January.  CT chest and abdomen were negative for pneumomediastinum or new acute abdominal process.  Symptoms were well controlled after receiving Phenergan and Dilaudid.  Labs were grossly within normal limits.  Given the above work-up, patient's current clinical condition, believe she is appropriate for discharge and outpatient management.  Recommended follow-up with her primary doctor as well as with her gynecologist.    After the discussed management above, the patient was determined to be safe for discharge.  The patient was in agreement with this plan and all questions regarding their care were answered.  ED return precautions were discussed and the patient will return to the ED with any significant worsening of condition.   Final Clinical Impression(s) / ED Diagnoses Final diagnoses:  Non-intractable vomiting with nausea, unspecified vomiting type    Rx / DC Orders ED Discharge Orders    None       Lucrezia Starch, MD 10/14/19 1302

## 2019-10-14 NOTE — ED Notes (Signed)
Pt discharged to home. Discharge instructions have been discussed with patient and/or family members. Pt verbally acknowledges understanding d/c instructions, and endorses comprehension to checkout at registration before leaving.  °

## 2019-10-14 NOTE — ED Triage Notes (Signed)
Pt endorses hx of endometriosis and crohns, C/o lower abdominal pain with N/V/D x 4 days. Pt denies fever. Pt tearful in triage

## 2019-10-24 DIAGNOSIS — Z90711 Acquired absence of uterus with remaining cervical stump: Secondary | ICD-10-CM

## 2019-10-24 HISTORY — DX: Acquired absence of uterus with remaining cervical stump: Z90.711

## 2019-10-25 DIAGNOSIS — Z90710 Acquired absence of both cervix and uterus: Secondary | ICD-10-CM | POA: Insufficient documentation

## 2019-11-18 ENCOUNTER — Emergency Department (HOSPITAL_BASED_OUTPATIENT_CLINIC_OR_DEPARTMENT_OTHER)
Admission: EM | Admit: 2019-11-18 | Discharge: 2019-11-18 | Disposition: A | Discharge status: Home / Self Care | Payer: Medicaid Other | Attending: Emergency Medicine | Admitting: Emergency Medicine

## 2019-11-18 ENCOUNTER — Other Ambulatory Visit: Payer: Self-pay

## 2019-11-18 ENCOUNTER — Encounter (HOSPITAL_BASED_OUTPATIENT_CLINIC_OR_DEPARTMENT_OTHER): Payer: Self-pay | Admitting: *Deleted

## 2019-11-18 DIAGNOSIS — Z885 Allergy status to narcotic agent status: Secondary | ICD-10-CM | POA: Diagnosis not present

## 2019-11-18 DIAGNOSIS — Z881 Allergy status to other antibiotic agents status: Secondary | ICD-10-CM | POA: Diagnosis not present

## 2019-11-18 DIAGNOSIS — Z888 Allergy status to other drugs, medicaments and biological substances status: Secondary | ICD-10-CM | POA: Insufficient documentation

## 2019-11-18 DIAGNOSIS — R112 Nausea with vomiting, unspecified: Secondary | ICD-10-CM | POA: Insufficient documentation

## 2019-11-18 DIAGNOSIS — Z91010 Allergy to peanuts: Secondary | ICD-10-CM | POA: Insufficient documentation

## 2019-11-18 DIAGNOSIS — R110 Nausea: Secondary | ICD-10-CM

## 2019-11-18 DIAGNOSIS — Z91018 Allergy to other foods: Secondary | ICD-10-CM | POA: Insufficient documentation

## 2019-11-18 DIAGNOSIS — Z79899 Other long term (current) drug therapy: Secondary | ICD-10-CM | POA: Diagnosis not present

## 2019-11-18 DIAGNOSIS — R1084 Generalized abdominal pain: Secondary | ICD-10-CM | POA: Diagnosis not present

## 2019-11-18 DIAGNOSIS — I10 Essential (primary) hypertension: Secondary | ICD-10-CM | POA: Insufficient documentation

## 2019-11-18 DIAGNOSIS — Z87891 Personal history of nicotine dependence: Secondary | ICD-10-CM | POA: Insufficient documentation

## 2019-11-18 LAB — COMPREHENSIVE METABOLIC PANEL WITH GFR
ALT: 9 U/L (ref 0–44)
AST: 18 U/L (ref 15–41)
Albumin: 4.8 g/dL (ref 3.5–5.0)
Alkaline Phosphatase: 79 U/L (ref 38–126)
Anion gap: 12 (ref 5–15)
BUN: 11 mg/dL (ref 6–20)
CO2: 24 mmol/L (ref 22–32)
Calcium: 9.8 mg/dL (ref 8.9–10.3)
Chloride: 102 mmol/L (ref 98–111)
Creatinine, Ser: 0.87 mg/dL (ref 0.44–1.00)
GFR calc Af Amer: >60 mL/min
GFR calc non Af Amer: >60 mL/min
Glucose, Bld: 104 mg/dL — ABNORMAL HIGH (ref 70–99)
Potassium: 3.6 mmol/L (ref 3.5–5.1)
Sodium: 138 mmol/L (ref 135–145)
Total Bilirubin: 0.6 mg/dL (ref 0.3–1.2)
Total Protein: 9 g/dL — ABNORMAL HIGH (ref 6.5–8.1)

## 2019-11-18 LAB — URINALYSIS, ROUTINE W REFLEX MICROSCOPIC
Bilirubin Urine: NEGATIVE
Glucose, UA: NEGATIVE mg/dL
Hgb urine dipstick: NEGATIVE
Ketones, ur: NEGATIVE mg/dL
Leukocytes,Ua: NEGATIVE
Nitrite: NEGATIVE
Protein, ur: NEGATIVE mg/dL
Specific Gravity, Urine: 1.015 (ref 1.005–1.030)
pH: 7 (ref 5.0–8.0)

## 2019-11-18 LAB — CBC WITH DIFFERENTIAL/PLATELET
Abs Immature Granulocytes: 0.06 10*3/uL (ref 0.00–0.07)
Basophils Absolute: 0.1 10*3/uL (ref 0.0–0.1)
Basophils Relative: 0 %
Eosinophils Absolute: 0.2 10*3/uL (ref 0.0–0.5)
Eosinophils Relative: 2 %
HCT: 40.1 % (ref 36.0–46.0)
Hemoglobin: 12.9 g/dL (ref 12.0–15.0)
Immature Granulocytes: 1 %
Lymphocytes Relative: 13 %
Lymphs Abs: 1.6 10*3/uL (ref 0.7–4.0)
MCH: 29.1 pg (ref 26.0–34.0)
MCHC: 32.2 g/dL (ref 30.0–36.0)
MCV: 90.3 fL (ref 80.0–100.0)
Monocytes Absolute: 0.7 10*3/uL (ref 0.1–1.0)
Monocytes Relative: 6 %
Neutro Abs: 10 10*3/uL — ABNORMAL HIGH (ref 1.7–7.7)
Neutrophils Relative %: 78 %
Platelets: 403 10*3/uL — ABNORMAL HIGH (ref 150–400)
RBC: 4.44 MIL/uL (ref 3.87–5.11)
RDW: 15.9 % — ABNORMAL HIGH (ref 11.5–15.5)
WBC: 12.6 10*3/uL — ABNORMAL HIGH (ref 4.0–10.5)
nRBC: 0 % (ref 0.0–0.2)

## 2019-11-18 LAB — RAPID URINE DRUG SCREEN, HOSP PERFORMED
Amphetamines: NOT DETECTED
Barbiturates: NOT DETECTED
Benzodiazepines: NOT DETECTED
Cocaine: NOT DETECTED
Opiates: NOT DETECTED
Tetrahydrocannabinol: POSITIVE — AB

## 2019-11-18 MED ORDER — PROMETHAZINE HCL 25 MG RE SUPP
25.0000 mg | Freq: Four times a day (QID) | RECTAL | 0 refills | Status: DC | PRN
Start: 2019-11-18 — End: 2020-04-13

## 2019-11-18 MED ORDER — LORAZEPAM 2 MG/ML IJ SOLN
1.0000 mg | Freq: Once | INTRAMUSCULAR | Status: AC
  Administered 2019-11-18: 11:00:00 1 mg via INTRAVENOUS
  Filled 2019-11-18: qty 1

## 2019-11-18 MED ORDER — METOCLOPRAMIDE HCL 5 MG/ML IJ SOLN
10.0000 mg | Freq: Once | INTRAMUSCULAR | Status: AC
  Administered 2019-11-18: 10 mg via INTRAVENOUS
  Filled 2019-11-18: qty 2

## 2019-11-18 MED ORDER — PROMETHAZINE HCL 25 MG/ML IJ SOLN
25.0000 mg | Freq: Once | INTRAMUSCULAR | Status: AC
  Administered 2019-11-18: 25 mg via INTRAVENOUS
  Filled 2019-11-18: qty 1

## 2019-11-18 MED ORDER — LORAZEPAM 1 MG PO TABS: 1.0000 mg | ORAL_TABLET | Freq: Once | ORAL | Status: DC

## 2019-11-18 MED ORDER — PROMETHAZINE HCL 25 MG PO TABS: 25.0000 mg | ORAL_TABLET | Freq: Four times a day (QID) | ORAL | 0 refills | Status: DC | PRN

## 2019-11-18 MED ORDER — HYDROMORPHONE HCL 1 MG/ML IJ SOLN
1.0000 mg | Freq: Once | INTRAMUSCULAR | Status: AC
  Administered 2019-11-18: 1 mg via INTRAVENOUS
  Filled 2019-11-18: qty 1

## 2019-11-18 MED ORDER — DIPHENHYDRAMINE HCL 50 MG/ML IJ SOLN
25.0000 mg | Freq: Once | INTRAMUSCULAR | Status: AC
  Administered 2019-11-18: 25 mg via INTRAVENOUS
  Filled 2019-11-18: qty 1

## 2019-11-18 MED ORDER — SODIUM CHLORIDE 0.9 % IV BOLUS
1000.0000 mL | Freq: Once | INTRAVENOUS | Status: AC
  Administered 2019-11-18: 1000 mL via INTRAVENOUS

## 2019-11-18 MED ORDER — KETOROLAC TROMETHAMINE 30 MG/ML IJ SOLN
30.0000 mg | Freq: Once | INTRAMUSCULAR | Status: AC
  Administered 2019-11-18: 30 mg via INTRAVENOUS
  Filled 2019-11-18: qty 1

## 2019-11-18 NOTE — ED Notes (Signed)
Patient wanted more pain med and phenergan stating that she did not get the pain med and phenergan earlier because her IV infiltrated.  Informed her that she got those med before the IV infiltrated.  EDP was also made aware of patient's request for more pain med and nausea and she stated that we have to wait for the Ativan to work.  Patient is looking drowsy and sleepy.

## 2019-11-18 NOTE — ED Notes (Signed)
Patient called again stating that she is did not get her pain med and nausea medicine because of IV being infiltrated.  I informed her again that she got those meds before the IV infiltrated and I also informed her that Ms. Santiago Glad, her EDP, is aware of her request.  Patient is very drowsy during this conversation.

## 2019-11-18 NOTE — ED Triage Notes (Signed)
Abdominal pain, nausea and vomiting started 2 days ago.  Had partial hysterectomy on the 16th of April 2021.

## 2019-11-18 NOTE — Discharge Instructions (Signed)
Use phenergan for nausea.  Drink plenty of fluids.  Avoid greasy food and alcohol.  Avoid marijuana use

## 2019-11-18 NOTE — ED Provider Notes (Signed)
[place] EMERGENCY DEPARTMENT Provider Note   CSN: [id number] Arrival date & time: 11/18/19  5809     History Chief Complaint  Patient presents with  . Abdominal Pain  . Nausea, Vomiting    Sherry Key is a 34 y.o. female.  The history is provided by the patient. No language interpreter was used.  Abdominal Pain Pain location:  Generalized Pain quality: aching   Pain radiates to:  Does not radiate Pain severity:  Moderate Onset quality:  Gradual Timing:  Constant Chronicity:  New Relieved by:  Nothing Worsened by:  Nothing Ineffective treatments:  None tried Associated symptoms: nausea and vomiting   Pt has a history of cyclical vomiting.  Pt reports she normally gets dilaudid and phenergan for symptoms.  Pt denies drugs or alcohol.  Pt had a hysterectomy last month due to endometriosis.Pt denies fevers or chills.       Past Medical History:  Diagnosis Date  . Crohn's disease ([place])   . Cyclical vomiting   . Endometriosis   . Fibroids   . History of partial hysterectomy 10/24/2019  . Hypertension   . PCOS (polycystic ovarian syndrome)     There are no problems to display for this patient.   Past Surgical History:  Procedure Laterality Date  . ABDOMINAL HYSTERECTOMY    . CESAREAN SECTION    . CESAREAN SECTION    . CHOLECYSTECTOMY    . HERNIA REPAIR    . TONSILLECTOMY    . TUBAL LIGATION    . TUBAL LIGATION       OB History   No obstetric history on file.     No family history on file.  Social History   Tobacco Use  . Smoking status: Former Smoker    Packs/day: 0.50    Types: Cigarettes  . Smokeless tobacco: Never Used  Substance Use Topics  . Alcohol use: No  . Drug use: Not Currently    Types: Marijuana    Comment: last used 04.2021    Home Medications Prior to Admission medications   Medication Sig Start Date End Date Taking? Authorizing Provider  Elagolix Sodium (ORILISSA) 200 MG TABS Take by mouth.    [provider]  flintstones complete (FLINTSTONES) 60 MG chewable tablet Chew 1 tablet by mouth daily.      [provider]  GABAPENTIN PO Take 1 capsule 3 (three) times daily by mouth.    [provider]  ibuprofen (ADVIL,MOTRIN) 800 MG tablet Take 1 tablet (800 mg total) 3 (three) times daily by mouth. 05/25/17   Mesner, Corene Cornea, MD  LABETALOL HCL PO Take 1 tablet 2 (two) times daily by mouth.    [provider]  pantoprazole (PROTONIX) 40 MG tablet Take by mouth. 10/14/18   [provider]  promethazine (PHENERGAN) 25 MG suppository Place 1 suppository (25 mg total) rectally every 6 (six) hours as needed for nausea or vomiting. 11/18/19   Fransico Meadow, PA-C  promethazine (PHENERGAN) 25 MG tablet Take 1 tablet (25 mg total) by mouth every 6 (six) hours as needed for nausea or vomiting. 11/18/19   Fransico Meadow, PA-C  propranolol ER (INDERAL LA) 60 MG 24 hr capsule Take by mouth. 10/02/19   [provider]    Allergies    Dicyclomine, Morphine, Percocet [oxycodone-acetaminophen], Zofran [ondansetron hcl], Other, Peanut-containing drug products, Percocet [oxycodone-acetaminophen], Tomato, Zofran, and Haloperidol  Review of Systems   Review of Systems  Gastrointestinal: Positive for abdominal pain,  nausea and vomiting.  All other systems reviewed and are negative.   Physical Exam Updated Vital Signs BP (!) 185/138 (BP Location: Right Arm)   Pulse (!) 102   Temp 98.3 F (36.8 C) (Oral)   Resp 20   Ht 5' 3"  (1.6 m)   Wt 61.2 kg   LMP 10/19/2019   SpO2 100%   BMI 23.91 kg/m   Physical Exam Vitals and nursing note reviewed.  Constitutional:      Appearance: She is well-developed.  HENT:     Head: Normocephalic.  Cardiovascular:     Rate and Rhythm: Normal rate.     Heart sounds: Normal heart sounds.  Pulmonary:     Effort: Pulmonary effort is normal.  Abdominal:     General: Abdomen is flat. Bowel sounds are normal. There is no  distension.     Palpations: Abdomen is soft.     Tenderness: There is generalized abdominal tenderness.  Musculoskeletal:        General: Normal range of motion.     Cervical back: Normal range of motion.  Skin:    General: Skin is warm.  Neurological:     Mental Status: She is alert and oriented to person, place, and time.  Psychiatric:        Mood and Affect: Mood normal.     ED Results / Procedures / Treatments   Labs (all labs ordered are listed, but only abnormal results are displayed) Labs Reviewed  CBC WITH DIFFERENTIAL/PLATELET - Abnormal; Notable for the following components:      Result Value   WBC 12.6 (*)    RDW 15.9 (*)    Platelets 403 (*)    Neutro Abs 10.0 (*)    All other components within normal limits  COMPREHENSIVE METABOLIC PANEL - Abnormal; Notable for the following components:   Glucose, Bld 104 (*)    Total Protein 9.0 (*)    All other components within normal limits  URINALYSIS, ROUTINE W REFLEX MICROSCOPIC - Abnormal; Notable for the following components:   APPearance HAZY (*)    All other components within normal limits  RAPID URINE DRUG SCREEN, HOSP PERFORMED - Abnormal; Notable for the following components:   Tetrahydrocannabinol POSITIVE (*)    All other components within normal limits    EKG None  Radiology No results found.  Procedures Procedures (including critical care time)  Medications Ordered in ED Medications  promethazine (PHENERGAN) injection 25 mg (25 mg Intravenous Given 11/18/19 1020)  HYDROmorphone (DILAUDID) injection 1 mg (1 mg Intravenous Given 11/18/19 1020)  sodium chloride 0.9 % bolus 1,000 mL (0 mLs Intravenous Stopped 11/18/19 1225)  LORazepam (ATIVAN) injection 1 mg (1 mg Intravenous Given 11/18/19 1058)  sodium chloride 0.9 % bolus 1,000 mL (1,000 mLs Intravenous New Bag/Given 11/18/19 1228)  ketorolac (TORADOL) 30 MG/ML injection 30 mg (30 mg Intravenous Given 11/18/19 1316)  diphenhydrAMINE (BENADRYL) injection  25 mg (25 mg Intravenous Given 11/18/19 1316)  metoCLOPramide (REGLAN) injection 10 mg (10 mg Intravenous Given 11/18/19 1315)    ED Course  I have reviewed the triage vital signs and the nursing notes.  Pertinent labs & imaging results that were available during my care of the patient were reviewed by me and considered in my medical decision making (see chart for details).    MDM Rules/Calculators/A&P                      MDM:  Pt given Iv fluids x  2 liters,  Pt had continued vomitting after phenergan and dilaudid.  Pt given ativan with some decreased in vomiting.  Pt complains of persistant nausea and some abdominal discomfort.  Pt given toradol, benadryl and reglan.  Pt sleeping on reevaluation.  Pt feels better.  Pt advised to avoid greasy food, clear liquids for 12 hours then progresses slowly.  Pt advised to avoid alcohol and marijuana.  Final Clinical Impression(s) / ED Diagnoses Final diagnoses:  Nausea  Nausea and vomiting, intractability of vomiting not specified, unspecified vomiting type    Rx / DC Orders ED Discharge Orders         Ordered    promethazine (PHENERGAN) 25 MG suppository  Every 6 hours PRN     11/18/19 1330    promethazine (PHENERGAN) 25 MG tablet  Every 6 hours PRN     11/18/19 1330        An After Visit Summary was printed and given to the patient.    Fransico Meadow, [place] 11/18/19 1413    Little, Wenda [place], MD 11/22/19 [phone]

## 2020-01-03 ENCOUNTER — Other Ambulatory Visit: Payer: Self-pay

## 2020-01-03 ENCOUNTER — Emergency Department (HOSPITAL_BASED_OUTPATIENT_CLINIC_OR_DEPARTMENT_OTHER)
Admission: EM | Admit: 2020-01-03 | Discharge: 2020-01-04 | Disposition: A | Discharge status: Home / Self Care | Payer: Medicaid Other | Attending: Emergency Medicine | Admitting: Emergency Medicine

## 2020-01-03 ENCOUNTER — Encounter (HOSPITAL_BASED_OUTPATIENT_CLINIC_OR_DEPARTMENT_OTHER): Payer: Self-pay | Admitting: Emergency Medicine

## 2020-01-03 DIAGNOSIS — Z87891 Personal history of nicotine dependence: Secondary | ICD-10-CM | POA: Diagnosis not present

## 2020-01-03 DIAGNOSIS — R1084 Generalized abdominal pain: Secondary | ICD-10-CM | POA: Insufficient documentation

## 2020-01-03 DIAGNOSIS — Z91010 Allergy to peanuts: Secondary | ICD-10-CM | POA: Diagnosis not present

## 2020-01-03 DIAGNOSIS — Z79899 Other long term (current) drug therapy: Secondary | ICD-10-CM | POA: Diagnosis not present

## 2020-01-03 DIAGNOSIS — R000 Tachycardia, unspecified: Secondary | ICD-10-CM | POA: Insufficient documentation

## 2020-01-03 DIAGNOSIS — R1115 Cyclical vomiting syndrome unrelated to migraine: Secondary | ICD-10-CM

## 2020-01-03 DIAGNOSIS — I10 Essential (primary) hypertension: Secondary | ICD-10-CM | POA: Insufficient documentation

## 2020-01-03 DIAGNOSIS — R109 Unspecified abdominal pain: Secondary | ICD-10-CM

## 2020-01-03 LAB — LIPASE, BLOOD: Lipase: 23 U/L (ref 11–51)

## 2020-01-03 LAB — URINALYSIS, MICROSCOPIC (REFLEX)

## 2020-01-03 LAB — CBC WITH DIFFERENTIAL/PLATELET
Abs Immature Granulocytes: 0.09 10*3/uL — ABNORMAL HIGH (ref 0.00–0.07)
Basophils Absolute: 0.1 10*3/uL (ref 0.0–0.1)
Basophils Relative: 0 %
Eosinophils Absolute: 0 10*3/uL (ref 0.0–0.5)
Eosinophils Relative: 0 %
HCT: 39.1 % (ref 36.0–46.0)
Hemoglobin: 12.6 g/dL (ref 12.0–15.0)
Immature Granulocytes: 1 %
Lymphocytes Relative: 5 %
Lymphs Abs: 1 10*3/uL (ref 0.7–4.0)
MCH: 29.9 pg (ref 26.0–34.0)
MCHC: 32.2 g/dL (ref 30.0–36.0)
MCV: 92.7 fL (ref 80.0–100.0)
Monocytes Absolute: 0.7 10*3/uL (ref 0.1–1.0)
Monocytes Relative: 4 %
Neutro Abs: 16.4 10*3/uL — ABNORMAL HIGH (ref 1.7–7.7)
Neutrophils Relative %: 90 %
Platelets: 271 10*3/uL (ref 150–400)
RBC: 4.22 MIL/uL (ref 3.87–5.11)
RDW: 14.9 % (ref 11.5–15.5)
WBC: 18.2 10*3/uL — ABNORMAL HIGH (ref 4.0–10.5)
nRBC: 0 % (ref 0.0–0.2)

## 2020-01-03 LAB — URINALYSIS, ROUTINE W REFLEX MICROSCOPIC
Bilirubin Urine: NEGATIVE
Glucose, UA: NEGATIVE mg/dL
Ketones, ur: 40 mg/dL — AB
Leukocytes,Ua: NEGATIVE
Nitrite: NEGATIVE
Protein, ur: NEGATIVE mg/dL
Specific Gravity, Urine: 1.025 (ref 1.005–1.030)
pH: 8 (ref 5.0–8.0)

## 2020-01-03 LAB — COMPREHENSIVE METABOLIC PANEL
ALT: 11 U/L (ref 0–44)
AST: 19 U/L (ref 15–41)
Albumin: 4.5 g/dL (ref 3.5–5.0)
Alkaline Phosphatase: 72 U/L (ref 38–126)
Anion gap: 11 (ref 5–15)
BUN: 9 mg/dL (ref 6–20)
CO2: 22 mmol/L (ref 22–32)
Calcium: 9.6 mg/dL (ref 8.9–10.3)
Chloride: 106 mmol/L (ref 98–111)
Creatinine, Ser: 0.8 mg/dL (ref 0.44–1.00)
GFR calc Af Amer: >60 mL/min (ref >60)
GFR calc non Af Amer: >60 mL/min (ref >60)
Glucose, Bld: 113 mg/dL — ABNORMAL HIGH (ref 70–99)
Potassium: 3.4 mmol/L — ABNORMAL LOW (ref 3.5–5.1)
Sodium: 139 mmol/L (ref 135–145)
Total Bilirubin: 0.8 mg/dL (ref 0.3–1.2)
Total Protein: 8.2 g/dL — ABNORMAL HIGH (ref 6.5–8.1)

## 2020-01-03 LAB — RAPID URINE DRUG SCREEN, HOSP PERFORMED
Amphetamines: NOT DETECTED
Barbiturates: NOT DETECTED
Benzodiazepines: NOT DETECTED
Cocaine: NOT DETECTED
Opiates: NOT DETECTED
Tetrahydrocannabinol: POSITIVE — AB

## 2020-01-03 LAB — C-REACTIVE PROTEIN: CRP: <0.5 mg/dL (ref <1.0)

## 2020-01-03 LAB — SEDIMENTATION RATE: Sed Rate: 21 mm/hr (ref 0–22)

## 2020-01-03 LAB — PREGNANCY, URINE: Preg Test, Ur: NEGATIVE

## 2020-01-03 MED ORDER — PROMETHAZINE HCL 25 MG PO TABS
25.0000 mg | ORAL_TABLET | ORAL | Status: AC
  Administered 2020-01-03: 25 mg via ORAL
  Filled 2020-01-03: qty 1

## 2020-01-03 MED ORDER — PROMETHAZINE HCL 25 MG/ML IJ SOLN
12.5000 mg | Freq: Once | INTRAMUSCULAR | Status: AC
  Administered 2020-01-03: 12.5 mg via INTRAVENOUS
  Filled 2020-01-03: qty 1

## 2020-01-03 MED ORDER — PROCHLORPERAZINE EDISYLATE 10 MG/2ML IJ SOLN
10.0000 mg | Freq: Once | INTRAMUSCULAR | Status: AC
  Administered 2020-01-03: 10 mg via INTRAVENOUS
  Filled 2020-01-03: qty 2

## 2020-01-03 MED ORDER — PROCHLORPERAZINE EDISYLATE 10 MG/2ML IJ SOLN: 10.0000 mg | Freq: Once | INTRAMUSCULAR | Status: DC

## 2020-01-03 MED ORDER — LACTATED RINGERS IV BOLUS
1000.0000 mL | Freq: Once | INTRAVENOUS | Status: AC
  Administered 2020-01-03: 1000 mL via INTRAVENOUS

## 2020-01-03 MED ORDER — HYOSCYAMINE SULFATE 0.125 MG SL SUBL
0.2500 mg | SUBLINGUAL_TABLET | Freq: Once | SUBLINGUAL | Status: AC
  Administered 2020-01-03: 0.25 mg via SUBLINGUAL
  Filled 2020-01-03: qty 2

## 2020-01-03 MED ORDER — DROPERIDOL 2.5 MG/ML IJ SOLN: 2.5000 mg | Freq: Once | INTRAMUSCULAR | Status: DC

## 2020-01-03 MED ORDER — LORAZEPAM 2 MG/ML IJ SOLN
1.0000 mg | Freq: Once | INTRAMUSCULAR | Status: AC
  Administered 2020-01-03: 1 mg via INTRAVENOUS
  Filled 2020-01-03: qty 1

## 2020-01-03 NOTE — ED Notes (Signed)
Mother at the bedside.

## 2020-01-03 NOTE — ED Triage Notes (Addendum)
abd pain with vomiting since last night. Hx of Crohn's disease. Pt very diaphoretic.

## 2020-01-03 NOTE — ED Notes (Signed)
Pt states that she is starting to feel better after medications

## 2020-01-03 NOTE — ED Notes (Signed)
MD in to reevaluate. The patient reports that she is now having abdominal pain and throwing up again. The patient is hunched over holding her stomach. Md updated the patient. The patient up to the restroom with steady gait and back to bed

## 2020-01-03 NOTE — ED Provider Notes (Signed)
[facility] EMERGENCY DEPARTMENT Provider Note   CSN: [id number] Arrival date & time: 01/03/20  1439     History Chief Complaint  Patient presents with  . Abdominal Pain    Sherry Key is a 34 y.o. female.  34 year old female with past medical history below including Crohn's disease, cyclical vomiting, endometriosis s/p hysterectomy, PCOS, hypertension who presents with vomiting and abdominal pain.  Patient states that she began having vomiting and abdominal pain last night that has persisted today.  She has associated diarrhea which is nonbloody.  No fevers, urinary symptoms, or sick contacts.  These symptoms are similar to previous episodes of vomiting and abdominal pain.  She has not had any recent medication changes.  Follows with GI at [facility].  The history is provided by the patient.  Abdominal Pain      Past Medical History:  Diagnosis Date  . Crohn's disease ([place])   . Cyclical vomiting   . Endometriosis   . Fibroids   . History of partial hysterectomy 10/24/2019  . Hypertension   . PCOS (polycystic ovarian syndrome)     There are no problems to display for this patient.   Past Surgical History:  Procedure Laterality Date  . ABDOMINAL HYSTERECTOMY    . CESAREAN SECTION    . CESAREAN SECTION    . CHOLECYSTECTOMY    . HERNIA REPAIR    . TONSILLECTOMY    . TUBAL LIGATION    . TUBAL LIGATION       OB History   No obstetric history on file.     No family history on file.  Social History   Tobacco Use  . Smoking status: Former Smoker    Packs/day: 0.50    Types: Cigarettes  . Smokeless tobacco: Never Used  Vaping Use  . Vaping Use: Never used  Substance Use Topics  . Alcohol use: No  . Drug use: Not Currently    Types: Marijuana    Comment: last used 04.2021    Home Medications Prior to Admission medications   Medication Sig Start Date End Date Taking? Authorizing Provider  Elagolix Sodium (ORILISSA) 200 MG TABS Take by  mouth.    [provider]  flintstones complete (FLINTSTONES) 60 MG chewable tablet Chew 1 tablet by mouth daily.      [provider]  GABAPENTIN PO Take 1 capsule 3 (three) times daily by mouth.    [provider]  ibuprofen (ADVIL,MOTRIN) 800 MG tablet Take 1 tablet (800 mg total) 3 (three) times daily by mouth. 05/25/17   Mesner, Corene Cornea, MD  LABETALOL HCL PO Take 1 tablet 2 (two) times daily by mouth.    [provider]  pantoprazole (PROTONIX) 40 MG tablet Take by mouth. 10/14/18   [provider]  promethazine (PHENERGAN) 25 MG suppository Place 1 suppository (25 mg total) rectally every 6 (six) hours as needed for nausea or vomiting. 11/18/19   Fransico Meadow, PA-C  promethazine (PHENERGAN) 25 MG tablet Take 1 tablet (25 mg total) by mouth every 6 (six) hours as needed for nausea or vomiting. 11/18/19   Fransico Meadow, PA-C  propranolol ER (INDERAL LA) 60 MG 24 hr capsule Take by mouth. 10/02/19   [provider]    Allergies    Dicyclomine, Morphine, Percocet [oxycodone-acetaminophen], Zofran [ondansetron hcl], Other, Peanut-containing drug products, Percocet [oxycodone-acetaminophen], Tomato, Zofran, and Haloperidol  Review of Systems   Review of Systems  Gastrointestinal: Positive for abdominal pain.  All other systems reviewed and are negative except that which was mentioned in HPI  Physical Exam Updated Vital Signs BP (!) 177/112   Pulse (!) 111   Temp 98.2 F (36.8 C) (Oral)   Resp 18   SpO2 100%   Physical Exam Vitals and nursing note reviewed.  Constitutional:      General: She is not in acute distress.    Appearance: She is well-developed.     Comments: Uncomfortable, sitting forward w/ emesis bag  HENT:     Head: Normocephalic and atraumatic.  Eyes:     Conjunctiva/sclera: Conjunctivae normal.  Cardiovascular:     Rate and Rhythm: Regular rhythm. Tachycardia present.     Heart sounds: Normal heart sounds. No  murmur heard.   Pulmonary:     Effort: Pulmonary effort is normal.     Breath sounds: Normal breath sounds.  Abdominal:     General: Bowel sounds are normal. There is no distension.     Palpations: Abdomen is soft.     Tenderness: There is generalized abdominal tenderness. There is no guarding or rebound.  Musculoskeletal:     Cervical back: Neck supple.  Skin:    General: Skin is warm and dry.  Neurological:     Mental Status: She is alert and oriented to person, place, and time.     Comments: Fluent speech  Psychiatric:        Judgment: Judgment normal.     ED Results / Procedures / Treatments   Labs (all labs ordered are listed, but only abnormal results are displayed) Labs Reviewed  COMPREHENSIVE METABOLIC PANEL - Abnormal; Notable for the following components:      Result Value   Potassium 3.4 (*)    Glucose, Bld 113 (*)    Total Protein 8.2 (*)    All other components within normal limits  CBC WITH DIFFERENTIAL/PLATELET - Abnormal; Notable for the following components:   WBC 18.2 (*)    Neutro Abs 16.4 (*)    Abs Immature Granulocytes 0.09 (*)    All other components within normal limits  URINALYSIS, ROUTINE W REFLEX MICROSCOPIC - Abnormal; Notable for the following components:   APPearance HAZY (*)    Hgb urine dipstick TRACE (*)    Ketones, ur 40 (*)    All other components within normal limits  RAPID URINE DRUG SCREEN, HOSP PERFORMED - Abnormal; Notable for the following components:   Tetrahydrocannabinol POSITIVE (*)    All other components within normal limits  URINALYSIS, MICROSCOPIC (REFLEX) - Abnormal; Notable for the following components:   Bacteria, UA MANY (*)    All other components within normal limits  LIPASE, BLOOD  PREGNANCY, URINE  SEDIMENTATION RATE  C-REACTIVE PROTEIN    EKG EKG Interpretation  Date/Time:  Saturday January 03 2020 14:56:38 EDT Ventricular Rate:  112 PR Interval:    QRS Duration: 83 QT Interval:  332 QTC  Calculation: 454 R Axis:   82 Text Interpretation: Sinus tachycardia Biatrial enlargement Borderline repolarization abnormality rate faster, otherwise similar to previous Confirmed by Theotis Burrow [phone]) on 01/03/2020 3:06:37 PM   Radiology No results found.  Procedures Procedures (including critical care time)  Medications Ordered in ED Medications  prochlorperazine (COMPAZINE) injection 10 mg (has no administration in time range)  LORazepam (ATIVAN) injection 1 mg (1 mg Intravenous Given 01/03/20 1546)  prochlorperazine (COMPAZINE) injection 10 mg (10 mg Intravenous Given 01/03/20 1547)  lactated ringers bolus 1,000 mL (0 mLs Intravenous Stopped 01/03/20 1728)  promethazine (PHENERGAN) tablet 25 mg (25 mg Oral Given 01/03/20 1728)  LORazepam (ATIVAN) injection 1 mg (1 mg Intravenous Given 01/03/20 1813)  promethazine (PHENERGAN) injection 12.5 mg (12.5 mg Intravenous Given 01/03/20 2343)  hyoscyamine (LEVSIN SL) SL tablet 0.25 mg (0.25 mg Sublingual Given 01/03/20 2348)    ED Course  I have reviewed the triage vital signs and the nursing notes.  Pertinent labs & imaging results that were available during my care of the patient were reviewed by me and considered in my medical decision making (see chart for details).    MDM Rules/Calculators/A&P                          Patient with vomiting/retching on initial exam, hypertensive and tachycardic but afebrile.  Abdomen was soft, mild generalized tenderness.  I reviewed her chart which shows multiple previous visits for similar symptoms.  During my evaluation, patient immediately asked for IV Phenergan and IV Dilaudid for her symptoms.  I explained that due to risk of compounding side effects with these 2 medications in combination, I do not ever administer these medications together.  It appears that based on previous notes she frequently requests these medications together.  I am concerned about the possibility of drug-seeking behavior as  I offered other medications that were not on her allergy list such as droperidol but patient immediately reported that she has an allergy to this medication although it is not listed on her chart.  Lab work shows UDS positive for THC, UA without obvious infection, reassuring CMP, WBC 18.2 however ESR and CRP are normal which is reassuring against acute infectious process and also reassuring against acute Crohn's flare.  Symptoms sound very consistent with previous ED visits for cyclical vomiting.  I have given the patient multiple rounds of medication including Ativan and Compazine.  On multiple reassessments, she has been asleep in bed and difficult to arouse.  She eventually woke up and began retching again, have ordered single dose of Phenergan with plan to discharge if improved. Final Clinical Impression(s) / ED Diagnoses Final diagnoses:  None    Rx / DC Orders ED Discharge Orders    None       Veralyn Lopp, Wenda Overland, MD 01/04/20 0004

## 2020-01-03 NOTE — ED Notes (Signed)
Meds given po  - pt is drinking water

## 2020-01-04 MED ORDER — SODIUM CHLORIDE 0.9 % IV BOLUS
500.0000 mL | Freq: Once | INTRAVENOUS | Status: AC
  Administered 2020-01-04: 500 mL via INTRAVENOUS

## 2020-01-04 MED ORDER — LIDOCAINE VISCOUS HCL 2 % MT SOLN
15.0000 mL | Freq: Once | OROMUCOSAL | Status: AC
  Administered 2020-01-04: 15 mL via ORAL
  Filled 2020-01-04: qty 15

## 2020-01-04 MED ORDER — METOCLOPRAMIDE HCL 5 MG/ML IJ SOLN
5.0000 mg | Freq: Once | INTRAMUSCULAR | Status: AC
  Administered 2020-01-04: 5 mg via INTRAVENOUS
  Filled 2020-01-04: qty 2

## 2020-01-04 MED ORDER — ALUM & MAG HYDROXIDE-SIMETH 200-200-20 MG/5ML PO SUSP
30.0000 mL | Freq: Once | ORAL | Status: AC
  Administered 2020-01-04: 30 mL via ORAL
  Filled 2020-01-04: qty 30

## 2020-01-04 NOTE — Discharge Instructions (Signed)
Cyclic Vomiting Syndrome, Adult Cyclic vomiting syndrome (CVS) is a condition that causes episodes of severe nausea and vomiting. It can last for hours or even days. Attacks may occur several times a month or several times a year. Between episodes of CVS, you may be otherwise healthy. What are the causes? The cause of this condition is not known. Although many of the episodes can happen for no obvious reason, you may have specific CVS triggers. Episodes may be triggered by:  An infection, especially colds and the flu.  Emotional stress, including excitement or anxiety about upcoming events, such as school, parties, or travel.  Certain foods or beverages, such as chocolate, cheese, alcohol, and food additives.  Motion sickness.  Eating a large meal before bed.  Being very tired.  Being too hot. What increases the risk? You are more likely to develop this condition if:  You get migraine headaches.  You have a family history of CVS or migraine headaches. What are the signs or symptoms? Symptoms tend to happen at the same time of day, and each episode tends to last about the same amount of time. Symptoms commonly start at night or when you wake up. Many people have warning signs (prodrome) before an episode, which may include slight nausea, sweating, and pale skin (pallor). The most common symptoms of a CVS attack include:  Severe vomiting. Vomiting may happen every 5-15 minutes.  Severe nausea.  Gagging (retching). Other symptoms may include:  Headache.  Dizziness.  Sensitivity to light.  Extreme thirst.  Abdominal pain. This can be severe.  Loose stools or diarrhea.  Fever.  Pale skin (pallor), especially on the face.  Weakness.  Exhaustion.  Sleepiness after a CVS episode.  Dehydration. This can cause: ? Thirst. ? Dry mouth. ? Decreased urination. ? Fatigue. How is this diagnosed? This condition may be diagnosed based on your symptoms, medical history,  and family history of CVS or migraine. Your health care provider will ask whether you have had:  Episodes of severe nausea and vomiting that have happened a total of 5 or more times, or 3 or more times in the past 6 months.  Episodes that last for 1 hour or more, and occur 1 week apart or farther apart.  Episodes that are similar each time.  Normal health between episodes. Your health care provider will also do a physical exam. To rule out other conditions, you may have tests, such as:  Blood tests.  Urine tests.  Imaging tests. How is this treated? There is no cure for this condition, but treatment can help manage or prevent CVS episodes. Work with your health care provider to find the best treatment for you. Treatment may include:  Avoiding stress and CVS triggers.  Eating smaller, more frequent meals.  Taking medicines, such as: ? Over-the-counter pain medicine. ? Anti-nausea medicines. ? Antacids. ? Antihistamines. ? Medicines for migraines. ? Antidepressants. ? Antibiotics. Severe nausea and vomiting may require you to stay at the hospital. You may need IV fluids to prevent or treat dehydration. Follow these instructions at home: During an episode  Take over-the-counter and prescription medicines only as told by your health care provider.  Stay in bed and rest in a dark, quiet room. After an episode   Drink an oral rehydration solution (ORS), if directed by your health care provider. This is a drink that helps you replace fluids and the salts and minerals in your blood (electrolytes). It can be found at pharmacies and retail stores.  Drink small amounts of clear fluids slowly and gradually add more. ? Drink clear fluids such as water or fruit juice that has water added (is diluted). You may also eat low-calorie popsicles. ? Avoid drinking fluids that contain a lot of sugar or caffeine, such as sports drinks and soda.  Eat soft foods in small amounts every 3-4  hours. Eat your regular diet, but avoid spicy or fatty foods, such as french fries and pizza. General instructions  Monitor your condition for any changes.  If you were prescribed an antibiotic medicine, take it as told by your health care provider. Do not stop taking the antibiotic even if you start to feel better.  Keep track of your attacks and symptoms, and pay attention to any triggers. Avoid those triggers when you can.  Keep all follow-up visits as told by your health care provider. This is important. Contact a health care provider if:  Your condition gets worse.  You cannot drink fluids without vomiting.  You have pain and trouble swallowing after an episode. Get help right away if:  You have blood in your vomit.  Your vomit looks like coffee grounds.  You have stools that are bloody or black, or stools that look like tar.  You have signs of dehydration, such as: ? Sunken eyes. ? Not making tears while crying. ? Very dry mouth. ? Cracked lips. ? Decreased urine production. ? Dark urine. Urine may be the color of tea. ? Weakness. ? Sleepiness. Summary  Cyclic vomiting syndrome (CVS) causes episodes of severe nausea and vomiting that can last for hours or even days.  Vomiting and diarrhea can make you feel weak and can lead to dehydration. If you notice signs of dehydration, call your health care provider right away.  Treatment can help you manage or prevent CVS episodes. Work with your health care provider to find the best treatment for you.  Keep all follow-up visits as told by your health care provider. This is important. This information is not intended to replace advice given to you by your health care provider. Make sure you discuss any questions you have with your health care provider. Document Revised: 04/01/2019 Document Reviewed: 08/11/2016 Elsevier Patient Education  2020 [employer].

## 2020-01-04 NOTE — ED Notes (Signed)
Able to keep a cup of gingerale down.  No longer retching at this time.  Provider made aware.

## 2020-01-20 ENCOUNTER — Emergency Department (HOSPITAL_COMMUNITY)
Admission: EM | Admit: 2020-01-20 | Discharge: 2020-01-20 | Disposition: A | Discharge status: Home / Self Care | Payer: Medicaid Other | Attending: Emergency Medicine | Admitting: Emergency Medicine

## 2020-01-20 ENCOUNTER — Other Ambulatory Visit (HOSPITAL_COMMUNITY): Payer: Medicaid Other

## 2020-01-20 ENCOUNTER — Emergency Department (HOSPITAL_COMMUNITY): Payer: Medicaid Other

## 2020-01-20 ENCOUNTER — Other Ambulatory Visit: Payer: Self-pay

## 2020-01-20 ENCOUNTER — Encounter (HOSPITAL_COMMUNITY): Payer: Self-pay | Admitting: Emergency Medicine

## 2020-01-20 DIAGNOSIS — Y929 Unspecified place or not applicable: Secondary | ICD-10-CM | POA: Diagnosis not present

## 2020-01-20 DIAGNOSIS — W01198A Fall on same level from slipping, tripping and stumbling with subsequent striking against other object, initial encounter: Secondary | ICD-10-CM | POA: Insufficient documentation

## 2020-01-20 DIAGNOSIS — S1093XA Contusion of unspecified part of neck, initial encounter: Secondary | ICD-10-CM | POA: Diagnosis not present

## 2020-01-20 DIAGNOSIS — R519 Headache, unspecified: Secondary | ICD-10-CM | POA: Diagnosis not present

## 2020-01-20 DIAGNOSIS — S300XXA Contusion of lower back and pelvis, initial encounter: Secondary | ICD-10-CM | POA: Diagnosis not present

## 2020-01-20 DIAGNOSIS — S20212A Contusion of left front wall of thorax, initial encounter: Secondary | ICD-10-CM | POA: Diagnosis not present

## 2020-01-20 DIAGNOSIS — S32010A Wedge compression fracture of first lumbar vertebra, initial encounter for closed fracture: Secondary | ICD-10-CM

## 2020-01-20 DIAGNOSIS — S199XXA Unspecified injury of neck, initial encounter: Secondary | ICD-10-CM | POA: Diagnosis present

## 2020-01-20 DIAGNOSIS — Z79899 Other long term (current) drug therapy: Secondary | ICD-10-CM | POA: Diagnosis not present

## 2020-01-20 DIAGNOSIS — Y999 Unspecified external cause status: Secondary | ICD-10-CM | POA: Insufficient documentation

## 2020-01-20 DIAGNOSIS — R109 Unspecified abdominal pain: Secondary | ICD-10-CM | POA: Diagnosis not present

## 2020-01-20 DIAGNOSIS — Y939 Activity, unspecified: Secondary | ICD-10-CM | POA: Diagnosis not present

## 2020-01-20 DIAGNOSIS — D649 Anemia, unspecified: Secondary | ICD-10-CM | POA: Diagnosis not present

## 2020-01-20 DIAGNOSIS — Z91010 Allergy to peanuts: Secondary | ICD-10-CM | POA: Diagnosis not present

## 2020-01-20 DIAGNOSIS — I10 Essential (primary) hypertension: Secondary | ICD-10-CM | POA: Insufficient documentation

## 2020-01-20 DIAGNOSIS — Z87891 Personal history of nicotine dependence: Secondary | ICD-10-CM | POA: Diagnosis not present

## 2020-01-20 LAB — CBC WITH DIFFERENTIAL/PLATELET
Abs Immature Granulocytes: 0.04 10*3/uL (ref 0.00–0.07)
Basophils Absolute: 0.1 10*3/uL (ref 0.0–0.1)
Basophils Relative: 0 %
Eosinophils Absolute: 0.2 10*3/uL (ref 0.0–0.5)
Eosinophils Relative: 2 %
HCT: 35.2 % — ABNORMAL LOW (ref 36.0–46.0)
Hemoglobin: 11.3 g/dL — ABNORMAL LOW (ref 12.0–15.0)
Immature Granulocytes: 0 %
Lymphocytes Relative: 13 %
Lymphs Abs: 1.6 10*3/uL (ref 0.7–4.0)
MCH: 29.4 pg (ref 26.0–34.0)
MCHC: 32.1 g/dL (ref 30.0–36.0)
MCV: 91.7 fL (ref 80.0–100.0)
Monocytes Absolute: 0.9 10*3/uL (ref 0.1–1.0)
Monocytes Relative: 8 %
Neutro Abs: 9.1 10*3/uL — ABNORMAL HIGH (ref 1.7–7.7)
Neutrophils Relative %: 77 %
Platelets: 286 10*3/uL (ref 150–400)
RBC: 3.84 MIL/uL — ABNORMAL LOW (ref 3.87–5.11)
RDW: 14.6 % (ref 11.5–15.5)
WBC: 11.8 10*3/uL — ABNORMAL HIGH (ref 4.0–10.5)
nRBC: 0 % (ref 0.0–0.2)

## 2020-01-20 LAB — COMPREHENSIVE METABOLIC PANEL
ALT: 11 U/L (ref 0–44)
AST: 20 U/L (ref 15–41)
Albumin: 3.9 g/dL (ref 3.5–5.0)
Alkaline Phosphatase: 66 U/L (ref 38–126)
Anion gap: 11 (ref 5–15)
BUN: 12 mg/dL (ref 6–20)
CO2: 22 mmol/L (ref 22–32)
Calcium: 9.3 mg/dL (ref 8.9–10.3)
Chloride: 106 mmol/L (ref 98–111)
Creatinine, Ser: 0.74 mg/dL (ref 0.44–1.00)
GFR calc Af Amer: >60 mL/min (ref >60)
GFR calc non Af Amer: >60 mL/min (ref >60)
Glucose, Bld: 103 mg/dL — ABNORMAL HIGH (ref 70–99)
Potassium: 3.7 mmol/L (ref 3.5–5.1)
Sodium: 139 mmol/L (ref 135–145)
Total Bilirubin: 0.7 mg/dL (ref 0.3–1.2)
Total Protein: 6.7 g/dL (ref 6.5–8.1)

## 2020-01-20 LAB — I-STAT BETA HCG BLOOD, ED (MC, WL, AP ONLY): I-stat hCG, quantitative: <5 m[IU]/mL (ref <5)

## 2020-01-20 LAB — ETHANOL: Alcohol, Ethyl (B): <10 mg/dL (ref <10)

## 2020-01-20 LAB — I-STAT CREATININE, ED: Creatinine, Ser: 0.7 mg/dL (ref 0.44–1.00)

## 2020-01-20 MED ORDER — HYDROMORPHONE HCL 1 MG/ML IJ SOLN
1.0000 mg | Freq: Once | INTRAMUSCULAR | Status: AC
  Administered 2020-01-20: 1 mg via INTRAVENOUS
  Filled 2020-01-20: qty 1

## 2020-01-20 MED ORDER — IOHEXOL 300 MG/ML  SOLN
75.0000 mL | Freq: Once | INTRAMUSCULAR | Status: AC | PRN
  Administered 2020-01-20: 75 mL via INTRAVENOUS

## 2020-01-20 MED ORDER — ONDANSETRON HCL 4 MG/2ML IJ SOLN
4.0000 mg | Freq: Once | INTRAMUSCULAR | Status: DC
  Filled 2020-01-20: qty 2

## 2020-01-20 MED ORDER — HYDROCODONE-ACETAMINOPHEN 5-325 MG PO TABS: 1.0000 | ORAL_TABLET | ORAL | 0 refills | Status: DC | PRN

## 2020-01-20 MED ORDER — SODIUM CHLORIDE 0.9 % IV BOLUS
1000.0000 mL | Freq: Once | INTRAVENOUS | Status: AC
  Administered 2020-01-20: 1000 mL via INTRAVENOUS

## 2020-01-20 MED ORDER — PROCHLORPERAZINE EDISYLATE 10 MG/2ML IJ SOLN
10.0000 mg | Freq: Once | INTRAMUSCULAR | Status: AC
  Administered 2020-01-20: 10 mg via INTRAVENOUS
  Filled 2020-01-20: qty 2
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
## 2020-01-20 NOTE — Progress Notes (Addendum)
Orthopedic Tech Progress Note Patient Details:  Sherry Key 1986-04-17 [id number] Called in order to [facility] for a TLSO BRACE Patient ID: [name], female   DOB: 1985/07/24, 34 y.o.   MRN: [id number]   Janit Pagan 01/20/2020, 8:44 AM

## 2020-01-20 NOTE — ED Notes (Signed)
Patient verbalizes understanding of discharge instructions. Opportunity for questioning and answers were provided. Armband removed by staff, pt discharged from ED.  

## 2020-04-13 ENCOUNTER — Encounter (HOSPITAL_BASED_OUTPATIENT_CLINIC_OR_DEPARTMENT_OTHER): Payer: Self-pay

## 2020-04-13 ENCOUNTER — Emergency Department (HOSPITAL_BASED_OUTPATIENT_CLINIC_OR_DEPARTMENT_OTHER)
Admission: EM | Admit: 2020-04-13 | Discharge: 2020-04-13 | Disposition: A | Discharge status: Home / Self Care | Payer: Medicaid Other | Attending: Emergency Medicine | Admitting: Emergency Medicine

## 2020-04-13 ENCOUNTER — Other Ambulatory Visit: Payer: Self-pay

## 2020-04-13 DIAGNOSIS — I10 Essential (primary) hypertension: Secondary | ICD-10-CM | POA: Diagnosis not present

## 2020-04-13 DIAGNOSIS — R109 Unspecified abdominal pain: Secondary | ICD-10-CM | POA: Insufficient documentation

## 2020-04-13 DIAGNOSIS — Z87891 Personal history of nicotine dependence: Secondary | ICD-10-CM | POA: Diagnosis not present

## 2020-04-13 DIAGNOSIS — R1110 Vomiting, unspecified: Secondary | ICD-10-CM

## 2020-04-13 DIAGNOSIS — F1210 Cannabis abuse, uncomplicated: Secondary | ICD-10-CM | POA: Diagnosis not present

## 2020-04-13 DIAGNOSIS — Z91010 Allergy to peanuts: Secondary | ICD-10-CM | POA: Diagnosis not present

## 2020-04-13 DIAGNOSIS — R112 Nausea with vomiting, unspecified: Secondary | ICD-10-CM | POA: Insufficient documentation

## 2020-04-13 LAB — URINALYSIS, ROUTINE W REFLEX MICROSCOPIC
Bilirubin Urine: NEGATIVE
Glucose, UA: NEGATIVE mg/dL
Ketones, ur: NEGATIVE mg/dL
Leukocytes,Ua: NEGATIVE
Nitrite: NEGATIVE
Protein, ur: NEGATIVE mg/dL
Specific Gravity, Urine: 1.02 (ref 1.005–1.030)
pH: 7 (ref 5.0–8.0)

## 2020-04-13 LAB — CBC WITH DIFFERENTIAL/PLATELET
Abs Immature Granulocytes: 0.14 10*3/uL — ABNORMAL HIGH (ref 0.00–0.07)
Basophils Absolute: 0.1 10*3/uL (ref 0.0–0.1)
Basophils Relative: 0 %
Eosinophils Absolute: 0.1 10*3/uL (ref 0.0–0.5)
Eosinophils Relative: 0 %
HCT: 40.1 % (ref 36.0–46.0)
Hemoglobin: 13.8 g/dL (ref 12.0–15.0)
Immature Granulocytes: 1 %
Lymphocytes Relative: 5 %
Lymphs Abs: 1.2 10*3/uL (ref 0.7–4.0)
MCH: 31.4 pg (ref 26.0–34.0)
MCHC: 34.4 g/dL (ref 30.0–36.0)
MCV: 91.3 fL (ref 80.0–100.0)
Monocytes Absolute: 1.1 10*3/uL — ABNORMAL HIGH (ref 0.1–1.0)
Monocytes Relative: 5 %
Neutro Abs: 21.3 10*3/uL — ABNORMAL HIGH (ref 1.7–7.7)
Neutrophils Relative %: 89 %
Platelets: ADEQUATE 10*3/uL (ref 150–400)
RBC: 4.39 MIL/uL (ref 3.87–5.11)
RDW: 12.8 % (ref 11.5–15.5)
Smear Review: NORMAL
WBC: 23.8 10*3/uL — ABNORMAL HIGH (ref 4.0–10.5)
nRBC: 0 % (ref 0.0–0.2)

## 2020-04-13 LAB — COMPREHENSIVE METABOLIC PANEL
ALT: 14 U/L (ref 0–44)
AST: 23 U/L (ref 15–41)
Albumin: 4.4 g/dL (ref 3.5–5.0)
Alkaline Phosphatase: 77 U/L (ref 38–126)
Anion gap: 16 — ABNORMAL HIGH (ref 5–15)
BUN: 12 mg/dL (ref 6–20)
CO2: 18 mmol/L — ABNORMAL LOW (ref 22–32)
Calcium: 9.7 mg/dL (ref 8.9–10.3)
Chloride: 104 mmol/L (ref 98–111)
Creatinine, Ser: 0.78 mg/dL (ref 0.44–1.00)
GFR calc non Af Amer: >60 mL/min (ref >60)
Glucose, Bld: 132 mg/dL — ABNORMAL HIGH (ref 70–99)
Potassium: 3.8 mmol/L (ref 3.5–5.1)
Sodium: 138 mmol/L (ref 135–145)
Total Bilirubin: 1 mg/dL (ref 0.3–1.2)
Total Protein: 8.1 g/dL (ref 6.5–8.1)

## 2020-04-13 LAB — URINALYSIS, MICROSCOPIC (REFLEX)

## 2020-04-13 LAB — LIPASE, BLOOD: Lipase: 29 U/L (ref 11–51)

## 2020-04-13 LAB — PREGNANCY, URINE: Preg Test, Ur: NEGATIVE

## 2020-04-13 MED ORDER — SODIUM CHLORIDE 0.9 % IV BOLUS
1000.0000 mL | Freq: Once | INTRAVENOUS | Status: AC
  Administered 2020-04-13: 1000 mL via INTRAVENOUS

## 2020-04-13 MED ORDER — PROMETHAZINE HCL 25 MG/ML IJ SOLN
12.5000 mg | Freq: Once | INTRAMUSCULAR | Status: AC
  Administered 2020-04-13: 12.5 mg via INTRAVENOUS
  Filled 2020-04-13: qty 1

## 2020-04-13 MED ORDER — HYDROMORPHONE HCL 1 MG/ML IJ SOLN
0.5000 mg | Freq: Once | INTRAMUSCULAR | Status: AC
  Administered 2020-04-13: 0.5 mg via INTRAVENOUS
  Filled 2020-04-13: qty 1

## 2020-04-13 MED ORDER — LORAZEPAM 2 MG/ML IJ SOLN
1.0000 mg | Freq: Once | INTRAMUSCULAR | Status: AC
  Administered 2020-04-13: 1 mg via INTRAVENOUS
  Filled 2020-04-13: qty 1

## 2020-04-13 MED ORDER — CAPSAICIN 0.075 % EX CREA
TOPICAL_CREAM | Freq: Once | CUTANEOUS | Status: AC
  Filled 2020-04-13: qty 60

## 2020-04-13 MED ORDER — PROMETHAZINE HCL 25 MG RE SUPP: 25.0000 mg | Freq: Four times a day (QID) | RECTAL | 0 refills | Status: DC | PRN

## 2020-04-13 NOTE — ED Triage Notes (Signed)
Per pt " I have been vomiting off/on all week".

## 2020-04-13 NOTE — ED Notes (Signed)
Pt redirected to please wear mask.

## 2020-04-13 NOTE — ED Notes (Signed)
Per pt unable to provide urine collection at this time.

## 2020-04-13 NOTE — ED Notes (Signed)
Blanket provided to pt.

## 2020-04-13 NOTE — ED Notes (Signed)
Reviewed D/C papers with pt, reviewed Rx with pt, pt denies questions at this time.

## 2020-04-13 NOTE — Discharge Instructions (Signed)
Please follow-up with primary doctor regarding your episode today.  Strongly recommend stopping all use of marijuana.  Take prescribed nausea medicine as needed.  If you have uncontrolled nausea vomiting, abdominal pain, return to ER for reassessment

## 2020-04-13 NOTE — ED Provider Notes (Signed)
[facility] Provider Note   CSN: [id number] Arrival date & time: 04/13/20  0757     History Chief Complaint  Patient presents with  . Emesis    Sherry Key is a 34 y.o. female. Presents to ER with concern for emesis. Patient reports that over the past day or so she has had worsening nausea, vomiting, abdominal pain. She reports very similar to prior episodes. Endorses continued use of marijuana but states she is trying to quit. Vomit is nonbloody nonbilious. No fevers.  HPI     Past Medical History:  Diagnosis Date  . Crohn's disease ([place])   . Cyclical vomiting   . Endometriosis   . Fibroids   . History of partial hysterectomy 10/24/2019  . Hypertension   . PCOS (polycystic ovarian syndrome)     There are no problems to display for this patient.   Past Surgical History:  Procedure Laterality Date  . ABDOMINAL HYSTERECTOMY    . CESAREAN SECTION    . CESAREAN SECTION    . CHOLECYSTECTOMY    . HERNIA REPAIR    . TONSILLECTOMY    . TUBAL LIGATION    . TUBAL LIGATION       OB History   No obstetric history on file.     History reviewed. No pertinent family history.  Social History   Tobacco Use  . Smoking status: Former Smoker    Packs/day: 0.50    Types: Cigarettes  . Smokeless tobacco: Never Used  Vaping Use  . Vaping Use: Never used  Substance Use Topics  . Alcohol use: No  . Drug use: Not Currently    Types: Marijuana    Comment: last used 04-06-2020    Home Medications Prior to Admission medications   Medication Sig Start Date End Date Taking? Authorizing Provider  flintstones complete (FLINTSTONES) 60 MG chewable tablet Chew 1 tablet by mouth daily.      [provider]  HYDROcodone-acetaminophen (NORCO/VICODIN) 5-325 MG tablet Take 1 tablet by mouth every 4 (four) hours as needed. 01/20/20   Isla Pence, MD  ibuprofen (ADVIL,MOTRIN) 800 MG tablet Take 1 tablet (800 mg total) 3 (three) times  daily by mouth. Patient not taking: Reported on 01/20/2020 05/25/17   Mesner, Corene Cornea, MD  promethazine (PHENERGAN) 25 MG suppository Place 1 suppository (25 mg total) rectally every 6 (six) hours as needed for nausea or vomiting. 04/13/20   Lucrezia Starch, MD  promethazine (PHENERGAN) 25 MG tablet Take 1 tablet (25 mg total) by mouth every 6 (six) hours as needed for nausea or vomiting. Patient not taking: Reported on 01/20/2020 11/18/19   Fransico Meadow, PA-C    Allergies    Dicyclomine, Morphine, Percocet [oxycodone-acetaminophen], Zofran [ondansetron hcl], Ketorolac, Other, Peanut-containing drug products, Percocet [oxycodone-acetaminophen], Tomato, Zofran, and Haloperidol  Review of Systems   Review of Systems  Constitutional: Positive for chills and fatigue. Negative for fever.  HENT: Negative for ear pain and sore throat.   Eyes: Negative for pain and visual disturbance.  Respiratory: Negative for cough and shortness of breath.   Cardiovascular: Negative for chest pain and palpitations.  Gastrointestinal: Positive for abdominal pain, nausea and vomiting.  Genitourinary: Negative for dysuria and hematuria.  Musculoskeletal: Negative for arthralgias and back pain.  Skin: Negative for color change and rash.  Neurological: Negative for seizures and syncope.  All other systems reviewed and are negative.   Physical Exam Updated Vital Signs BP (!) 122/98 (BP Location: Right Arm)  Pulse (!) 102   Temp 97.9 F (36.6 C) (Oral)   Resp 16   Ht 5' 3"  (1.6 m)   Wt 51.9 kg   LMP 10/19/2019   SpO2 100%   BMI 20.27 kg/m   Physical Exam Vitals and nursing note reviewed.  Constitutional:      General: She is not in acute distress.    Appearance: She is well-developed.  HENT:     Head: Normocephalic and atraumatic.  Eyes:     Conjunctiva/sclera: Conjunctivae normal.  Cardiovascular:     Rate and Rhythm: Normal rate and regular rhythm.     Heart sounds: No murmur heard.    Pulmonary:     Effort: Pulmonary effort is normal. No respiratory distress.     Breath sounds: Normal breath sounds.  Abdominal:     Palpations: Abdomen is soft.     Tenderness: There is no abdominal tenderness.  Musculoskeletal:     Cervical back: Neck supple.  Skin:    General: Skin is warm and dry.  Neurological:     General: No focal deficit present.     Mental Status: She is alert and oriented to person, place, and time.     ED Results / Procedures / Treatments   Labs (all labs ordered are listed, but only abnormal results are displayed) Labs Reviewed  CBC WITH DIFFERENTIAL/PLATELET - Abnormal; Notable for the following components:      Result Value   WBC 23.8 (*)    Neutro Abs 21.3 (*)    Monocytes Absolute 1.1 (*)    Abs Immature Granulocytes 0.14 (*)    All other components within normal limits  COMPREHENSIVE METABOLIC PANEL - Abnormal; Notable for the following components:   CO2 18 (*)    Glucose, Bld 132 (*)    Anion gap 16 (*)    All other components within normal limits  URINALYSIS, ROUTINE W REFLEX MICROSCOPIC - Abnormal; Notable for the following components:   APPearance CLOUDY (*)    Hgb urine dipstick TRACE (*)    All other components within normal limits  URINALYSIS, MICROSCOPIC (REFLEX) - Abnormal; Notable for the following components:   Bacteria, UA MANY (*)    All other components within normal limits  PREGNANCY, URINE  LIPASE, BLOOD    EKG None  Radiology No results found.  Procedures Procedures (including critical care time)  Medications Ordered in ED Medications  LORazepam (ATIVAN) injection 1 mg (1 mg Intravenous Given 04/13/20 0841)  promethazine (PHENERGAN) injection 12.5 mg (12.5 mg Intravenous Given 04/13/20 0845)  sodium chloride 0.9 % bolus 1,000 mL (0 mLs Intravenous Stopped 04/13/20 1138)  capsicum (ZOSTRIX) 0.075 % cream ( Topical Given 04/13/20 1034)  HYDROmorphone (DILAUDID) injection 0.5 mg (0.5 mg Intravenous Given 04/13/20  1032)  sodium chloride 0.9 % bolus 1,000 mL (0 mLs Intravenous Stopped 04/13/20 1314)    ED Course  I have reviewed the triage vital signs and the nursing notes.  Pertinent labs & imaging results that were available during my care of the patient were reviewed by me and considered in my medical decision making (see chart for details).  Clinical Course as of Apr 14 1535  Tue Apr 13, 2020  1300 Symptoms completely resolved, requesting discharge   [RD]    Clinical Course User Index [RD] Lucrezia Starch, MD   MDM Rules/Calculators/A&P  34 year old lady presents to ER with concern for nausea, vomiting, abdominal pain. She has long history of cannabis hyperemesis. She was provided symptomatic control, initially did not improve after Ativan and Phenergan however after receiving capsaicin cream, she had complete resolution of her symptoms. Her labs were notable for leukocytosis, I suspect this is secondary to vomiting. Her abdomen did not have any focal tenderness, her repeat abdominal exam remains soft, very low suspicion for acute abdominal process. Labs were stable except noted mild elevation in her anion gap, suspect dehydration. Provided fluids. No urinary symptoms. Given she had complete resolution of her symptoms and she has reassuring abdominal exam, believe patient can be discharged home at this time. Recommended follow-up with her primary doctor, discussed marijuana cessation. Reviewed return precautions.    After the discussed management above, the patient was determined to be safe for discharge.  The patient was in agreement with this plan and all questions regarding their care were answered.  ED return precautions were discussed and the patient will return to the ED with any significant worsening of condition.    Final Clinical Impression(s) / ED Diagnoses Final diagnoses:  Hyperemesis  Cannabis abuse    Rx / DC Orders ED Discharge Orders          Ordered    promethazine (PHENERGAN) 25 MG suppository  Every 6 hours PRN        04/13/20 1330           Lucrezia Starch, MD 04/13/20 1542

## 2020-08-03 ENCOUNTER — Other Ambulatory Visit: Payer: Self-pay

## 2020-08-03 ENCOUNTER — Emergency Department (HOSPITAL_BASED_OUTPATIENT_CLINIC_OR_DEPARTMENT_OTHER)
Admission: EM | Admit: 2020-08-03 | Discharge: 2020-08-03 | Disposition: A | Discharge status: Home / Self Care | Payer: Medicaid Other | Attending: Emergency Medicine | Admitting: Emergency Medicine

## 2020-08-03 ENCOUNTER — Encounter (HOSPITAL_BASED_OUTPATIENT_CLINIC_OR_DEPARTMENT_OTHER): Payer: Self-pay

## 2020-08-03 DIAGNOSIS — R197 Diarrhea, unspecified: Secondary | ICD-10-CM | POA: Insufficient documentation

## 2020-08-03 DIAGNOSIS — R109 Unspecified abdominal pain: Secondary | ICD-10-CM | POA: Diagnosis not present

## 2020-08-03 DIAGNOSIS — Z87891 Personal history of nicotine dependence: Secondary | ICD-10-CM | POA: Diagnosis not present

## 2020-08-03 DIAGNOSIS — R1115 Cyclical vomiting syndrome unrelated to migraine: Secondary | ICD-10-CM | POA: Diagnosis not present

## 2020-08-03 DIAGNOSIS — K5090 Crohn's disease, unspecified, without complications: Secondary | ICD-10-CM | POA: Diagnosis not present

## 2020-08-03 DIAGNOSIS — I10 Essential (primary) hypertension: Secondary | ICD-10-CM | POA: Diagnosis not present

## 2020-08-03 DIAGNOSIS — Z91010 Allergy to peanuts: Secondary | ICD-10-CM | POA: Diagnosis not present

## 2020-08-03 DIAGNOSIS — R112 Nausea with vomiting, unspecified: Secondary | ICD-10-CM | POA: Diagnosis present

## 2020-08-03 LAB — COMPREHENSIVE METABOLIC PANEL
ALT: 11 U/L (ref 0–44)
AST: 18 U/L (ref 15–41)
Albumin: 4.9 g/dL (ref 3.5–5.0)
Alkaline Phosphatase: 69 U/L (ref 38–126)
Anion gap: 13 (ref 5–15)
BUN: 17 mg/dL (ref 6–20)
CO2: 23 mmol/L (ref 22–32)
Calcium: 9.7 mg/dL (ref 8.9–10.3)
Chloride: 101 mmol/L (ref 98–111)
Creatinine, Ser: 0.95 mg/dL (ref 0.44–1.00)
GFR, Estimated: >60 mL/min (ref >60)
Glucose, Bld: 135 mg/dL — ABNORMAL HIGH (ref 70–99)
Potassium: 3.1 mmol/L — ABNORMAL LOW (ref 3.5–5.1)
Sodium: 137 mmol/L (ref 135–145)
Total Bilirubin: 0.8 mg/dL (ref 0.3–1.2)
Total Protein: 8.5 g/dL — ABNORMAL HIGH (ref 6.5–8.1)

## 2020-08-03 LAB — URINALYSIS, ROUTINE W REFLEX MICROSCOPIC
Bilirubin Urine: NEGATIVE
Glucose, UA: NEGATIVE mg/dL
Ketones, ur: 15 mg/dL — AB
Leukocytes,Ua: NEGATIVE
Nitrite: NEGATIVE
Protein, ur: 30 mg/dL — AB
Specific Gravity, Urine: 1.02 (ref 1.005–1.030)
pH: 7 (ref 5.0–8.0)

## 2020-08-03 LAB — LIPASE, BLOOD: Lipase: 35 U/L (ref 11–51)

## 2020-08-03 LAB — CBC
HCT: 44.5 % (ref 36.0–46.0)
Hemoglobin: 15.1 g/dL — ABNORMAL HIGH (ref 12.0–15.0)
MCH: 31.7 pg (ref 26.0–34.0)
MCHC: 33.9 g/dL (ref 30.0–36.0)
MCV: 93.5 fL (ref 80.0–100.0)
Platelets: 287 10*3/uL (ref 150–400)
RBC: 4.76 MIL/uL (ref 3.87–5.11)
RDW: 12.5 % (ref 11.5–15.5)
WBC: 15.5 10*3/uL — ABNORMAL HIGH (ref 4.0–10.5)
nRBC: 0 % (ref 0.0–0.2)

## 2020-08-03 LAB — URINALYSIS, MICROSCOPIC (REFLEX)

## 2020-08-03 LAB — PREGNANCY, URINE: Preg Test, Ur: NEGATIVE

## 2020-08-03 MED ORDER — METOCLOPRAMIDE HCL 5 MG/ML IJ SOLN
10.0000 mg | Freq: Once | INTRAMUSCULAR | Status: AC
  Administered 2020-08-03: 10 mg via INTRAVENOUS
  Filled 2020-08-03: qty 2

## 2020-08-03 MED ORDER — POTASSIUM CHLORIDE CRYS ER 20 MEQ PO TBCR
40.0000 meq | EXTENDED_RELEASE_TABLET | Freq: Once | ORAL | Status: AC
  Administered 2020-08-03: 40 meq via ORAL
  Filled 2020-08-03: qty 2

## 2020-08-03 MED ORDER — HYDROMORPHONE HCL 1 MG/ML IJ SOLN
1.0000 mg | Freq: Once | INTRAMUSCULAR | Status: AC
  Administered 2020-08-03: 1 mg via INTRAVENOUS
  Filled 2020-08-03: qty 1

## 2020-08-03 MED ORDER — DROPERIDOL 2.5 MG/ML IJ SOLN
2.5000 mg | Freq: Once | INTRAMUSCULAR | Status: DC
  Filled 2020-08-03: qty 2

## 2020-08-03 MED ORDER — PROMETHAZINE HCL 25 MG/ML IJ SOLN
25.0000 mg | Freq: Once | INTRAMUSCULAR | Status: AC
  Administered 2020-08-03: 25 mg via INTRAVENOUS
  Filled 2020-08-03: qty 1

## 2020-08-03 MED ORDER — LACTATED RINGERS IV BOLUS
1000.0000 mL | Freq: Once | INTRAVENOUS | Status: AC
  Administered 2020-08-03: 1000 mL via INTRAVENOUS

## 2020-08-03 NOTE — Discharge Instructions (Signed)
If you develop worsening, continued, or recurrent abdominal pain, uncontrolled vomiting, fever, chest or back pain, or any other new/concerning symptoms then return to the ER for evaluation.  

## 2020-08-03 NOTE — ED Notes (Signed)
Pt aware of urine specimen needed. Pt states that at the moment cannot void. Will check on pt later

## 2020-08-03 NOTE — ED Notes (Signed)
Pt on monitor and vitals cycling

## 2020-08-03 NOTE — ED Triage Notes (Signed)
Pt BIB [facility] from Home with Generalized ABD Pain.  Pt has been having ABD for 3-4 days with worsening progression and N/V.   Hx of IBS.   Pt tachycardic at 110 and hypertensive at 276 systolic with EMS. All other VSS with EMS  Pt A&Ox4, GCS 15

## 2020-08-03 NOTE — ED Provider Notes (Signed)
Sherry Key   CSN: [id number] Arrival date & time: 08/03/20  7356     History Chief Complaint  Patient presents with  . Abdominal Pain  . Nausea  . Emesis    Sherry Key is a 35 y.o. female.  HPI 35 year old female presents with recurrent abdominal pain and vomiting and diarrhea.  She states this started 2 days ago.  No blood in her emesis.  No fevers.  Feels like many prior episodes of cyclic vomiting per her. Pain is rated at a 10.   Past Medical History:  Diagnosis Date  . Crohn's disease ([place])   . Cyclical vomiting   . Endometriosis   . Fibroids   . History of partial hysterectomy 10/24/2019  . Hypertension   . PCOS (polycystic ovarian syndrome)     There are no problems to display for this patient.   Past Surgical History:  Procedure Laterality Date  . ABDOMINAL HYSTERECTOMY    . CESAREAN SECTION    . CESAREAN SECTION    . CHOLECYSTECTOMY    . HERNIA REPAIR    . TONSILLECTOMY    . TUBAL LIGATION    . TUBAL LIGATION       OB History   No obstetric history on file.     No family history on file.  Social History   Tobacco Use  . Smoking status: Former Smoker    Packs/day: 0.50    Types: Cigarettes  . Smokeless tobacco: Never Used  Vaping Use  . Vaping Use: Never used  Substance Use Topics  . Alcohol use: No  . Drug use: Not Currently    Types: Marijuana    Comment: last used 04-06-2020    Home Medications Prior to Admission medications   Medication Sig Start Date End Date Taking? Authorizing Provider  flintstones complete (FLINTSTONES) 60 MG chewable tablet Chew 1 tablet by mouth daily.      [provider]  HYDROcodone-acetaminophen (NORCO/VICODIN) 5-325 MG tablet Take 1 tablet by mouth every 4 (four) hours as needed. 01/20/20   Isla Pence, MD  ibuprofen (ADVIL,MOTRIN) 800 MG tablet Take 1 tablet (800 mg total) 3 (three) times daily by mouth. Patient not taking: Reported on  01/20/2020 05/25/17   Mesner, Corene Cornea, MD  promethazine (PHENERGAN) 25 MG suppository Place 1 suppository (25 mg total) rectally every 6 (six) hours as needed for nausea or vomiting. 04/13/20   Lucrezia Starch, MD  promethazine (PHENERGAN) 25 MG tablet Take 1 tablet (25 mg total) by mouth every 6 (six) hours as needed for nausea or vomiting. Patient not taking: Reported on 01/20/2020 11/18/19   Fransico Meadow, PA-C    Allergies    Dicyclomine, Morphine, Percocet [oxycodone-acetaminophen], Zofran [ondansetron hcl], Ketorolac, Other, Peanut-containing drug products, Percocet [oxycodone-acetaminophen], Tomato, Zofran, and Haloperidol  Review of Systems   Review of Systems  Constitutional: Negative for fever.  Gastrointestinal: Positive for abdominal pain, diarrhea and vomiting.  All other systems reviewed and are negative.   Physical Exam Updated Vital Signs BP (!) 159/128   Pulse 84   Temp 98 F (36.7 C) (Oral)   Resp 18   Ht 5' 3"  (1.6 m)   Wt 51.9 kg   LMP 10/19/2019   SpO2 99%   BMI 20.27 kg/m   Physical Exam Vitals and nursing Key reviewed.  Constitutional:      Appearance: She is well-developed and well-nourished. She is not ill-appearing or diaphoretic.  HENT:  Head: Normocephalic and atraumatic.     Right Ear: External ear normal.     Left Ear: External ear normal.     Nose: Nose normal.  Eyes:     General:        Right eye: No discharge.        Left eye: No discharge.  Cardiovascular:     Rate and Rhythm: Normal rate and regular rhythm.     Heart sounds: Normal heart sounds.  Pulmonary:     Effort: Pulmonary effort is normal.     Breath sounds: Normal breath sounds.  Abdominal:     Palpations: Abdomen is soft.     Tenderness: There is generalized abdominal tenderness.  Skin:    General: Skin is warm and dry.  Neurological:     Mental Status: She is alert.  Psychiatric:        Mood and Affect: Mood is not anxious.     ED Results / Procedures /  Treatments   Labs (all labs ordered are listed, but only abnormal results are displayed) Labs Reviewed  COMPREHENSIVE METABOLIC PANEL - Abnormal; Notable for the following components:      Result Value   Potassium 3.1 (*)    Glucose, Bld 135 (*)    Total Protein 8.5 (*)    All other components within normal limits  CBC - Abnormal; Notable for the following components:   WBC 15.5 (*)    Hemoglobin 15.1 (*)    All other components within normal limits  URINALYSIS, ROUTINE W REFLEX MICROSCOPIC - Abnormal; Notable for the following components:   APPearance CLOUDY (*)    Hgb urine dipstick MODERATE (*)    Ketones, ur 15 (*)    Protein, ur 30 (*)    All other components within normal limits  URINALYSIS, MICROSCOPIC (REFLEX) - Abnormal; Notable for the following components:   Bacteria, UA MANY (*)    All other components within normal limits  LIPASE, BLOOD  PREGNANCY, URINE    EKG EKG Interpretation  Date/Time:  Tuesday August 03 2020 07:48:02 EST Ventricular Rate:  93 PR Interval:    QRS Duration: 87 QT Interval:  359 QTC Calculation: 447 R Axis:   82 Text Interpretation: Sinus rhythm Biatrial enlargement Left ventricular hypertrophy similar to June 2021 Confirmed by Sherwood Gambler [phone]) on 08/03/2020 7:53:58 AM   Radiology No results found.  Procedures Procedures  Medications Ordered in ED Medications  lactated ringers bolus 1,000 mL (0 mLs Intravenous Stopped 08/03/20 0938)  metoCLOPramide (REGLAN) injection 10 mg (10 mg Intravenous Given 08/03/20 0825)  HYDROmorphone (DILAUDID) injection 1 mg (1 mg Intravenous Given 08/03/20 0824)  promethazine (PHENERGAN) injection 25 mg (25 mg Intravenous Given 08/03/20 0913)  potassium chloride SA (KLOR-CON) CR tablet 40 mEq (40 mEq Oral Given 08/03/20 0912)    ED Course  I have reviewed the triage vital signs and the nursing notes.  Pertinent labs & imaging results that were available during my care of the patient were  reviewed by me and considered in my medical decision making (see chart for details).    MDM Rules/Calculators/A&P                          Patient presents with recurrent cyclic vomiting.  She is feeling better after Phenergan after initial round of meds.  There is bacteria in her urine but this appears contaminated.  Otherwise labs are reassuring besides hypokalemia and she has tolerated potassium.  This is a recurrent issue and I do not think CT imaging is needed.  Discharged home. Final Clinical Impression(s) / ED Diagnoses Final diagnoses:  Cyclic vomiting syndrome    Rx / DC Orders ED Discharge Orders    None       Sherwood Gambler, MD 08/03/20 1545

## 2020-09-30 ENCOUNTER — Emergency Department (HOSPITAL_BASED_OUTPATIENT_CLINIC_OR_DEPARTMENT_OTHER)
Admission: EM | Admit: 2020-09-30 | Discharge: 2020-09-30 | Disposition: A | Discharge status: Home / Self Care | Payer: Medicaid Other | Attending: Emergency Medicine | Admitting: Emergency Medicine

## 2020-09-30 ENCOUNTER — Other Ambulatory Visit (HOSPITAL_COMMUNITY): Payer: Self-pay | Admitting: Emergency Medicine

## 2020-09-30 ENCOUNTER — Other Ambulatory Visit: Payer: Self-pay

## 2020-09-30 ENCOUNTER — Encounter (HOSPITAL_BASED_OUTPATIENT_CLINIC_OR_DEPARTMENT_OTHER): Payer: Self-pay | Admitting: Emergency Medicine

## 2020-09-30 DIAGNOSIS — Z91010 Allergy to peanuts: Secondary | ICD-10-CM | POA: Diagnosis not present

## 2020-09-30 DIAGNOSIS — I10 Essential (primary) hypertension: Secondary | ICD-10-CM | POA: Diagnosis not present

## 2020-09-30 DIAGNOSIS — R1110 Vomiting, unspecified: Secondary | ICD-10-CM | POA: Diagnosis not present

## 2020-09-30 DIAGNOSIS — F1210 Cannabis abuse, uncomplicated: Secondary | ICD-10-CM | POA: Diagnosis not present

## 2020-09-30 DIAGNOSIS — Z87891 Personal history of nicotine dependence: Secondary | ICD-10-CM | POA: Diagnosis not present

## 2020-09-30 LAB — RAPID URINE DRUG SCREEN, HOSP PERFORMED
Amphetamines: NOT DETECTED
Barbiturates: NOT DETECTED
Benzodiazepines: NOT DETECTED
Cocaine: NOT DETECTED
Opiates: NOT DETECTED
Tetrahydrocannabinol: POSITIVE — AB

## 2020-09-30 LAB — URINALYSIS, ROUTINE W REFLEX MICROSCOPIC
Bilirubin Urine: NEGATIVE
Glucose, UA: NEGATIVE mg/dL
Ketones, ur: NEGATIVE mg/dL
Leukocytes,Ua: NEGATIVE
Nitrite: NEGATIVE
Protein, ur: NEGATIVE mg/dL
Specific Gravity, Urine: 1.015 (ref 1.005–1.030)
pH: 7.5 (ref 5.0–8.0)

## 2020-09-30 LAB — CBC
HCT: 38.9 % (ref 36.0–46.0)
Hemoglobin: 12.9 g/dL (ref 12.0–15.0)
MCH: 31.8 pg (ref 26.0–34.0)
MCHC: 33.2 g/dL (ref 30.0–36.0)
MCV: 95.8 fL (ref 80.0–100.0)
Platelets: 199 10*3/uL (ref 150–400)
RBC: 4.06 MIL/uL (ref 3.87–5.11)
RDW: 12.3 % (ref 11.5–15.5)
WBC: 8.6 10*3/uL (ref 4.0–10.5)
nRBC: 0 % (ref 0.0–0.2)

## 2020-09-30 LAB — COMPREHENSIVE METABOLIC PANEL
ALT: 9 U/L (ref 0–44)
AST: 21 U/L (ref 15–41)
Albumin: 4 g/dL (ref 3.5–5.0)
Alkaline Phosphatase: 55 U/L (ref 38–126)
Anion gap: 10 (ref 5–15)
BUN: 7 mg/dL (ref 6–20)
CO2: 22 mmol/L (ref 22–32)
Calcium: 9 mg/dL (ref 8.9–10.3)
Chloride: 107 mmol/L (ref 98–111)
Creatinine, Ser: 0.7 mg/dL (ref 0.44–1.00)
GFR, Estimated: >60 mL/min (ref >60)
Glucose, Bld: 125 mg/dL — ABNORMAL HIGH (ref 70–99)
Potassium: 3.7 mmol/L (ref 3.5–5.1)
Sodium: 139 mmol/L (ref 135–145)
Total Bilirubin: 0.5 mg/dL (ref 0.3–1.2)
Total Protein: 7.2 g/dL (ref 6.5–8.1)

## 2020-09-30 LAB — URINALYSIS, MICROSCOPIC (REFLEX): Bacteria, UA: NONE SEEN

## 2020-09-30 LAB — LIPASE, BLOOD: Lipase: 31 U/L (ref 11–51)

## 2020-09-30 MED ORDER — SODIUM CHLORIDE 0.9 % IV SOLN: INTRAVENOUS | Status: DC

## 2020-09-30 MED ORDER — PROMETHAZINE HCL 25 MG PO TABS: 25.0000 mg | ORAL_TABLET | Freq: Four times a day (QID) | ORAL | 1 refills | Status: DC | PRN

## 2020-09-30 MED ORDER — METOCLOPRAMIDE HCL 5 MG/ML IJ SOLN
10.0000 mg | Freq: Once | INTRAMUSCULAR | Status: AC
  Administered 2020-09-30: 10 mg via INTRAVENOUS
  Filled 2020-09-30: qty 2

## 2020-09-30 MED ORDER — HYDROMORPHONE HCL 1 MG/ML IJ SOLN
1.0000 mg | Freq: Once | INTRAMUSCULAR | Status: AC
  Administered 2020-09-30: 1 mg via INTRAVENOUS
  Filled 2020-09-30: qty 1

## 2020-09-30 MED ORDER — DIPHENHYDRAMINE HCL 50 MG/ML IJ SOLN
INTRAMUSCULAR | Status: AC
  Administered 2020-09-30: 25 mg via INTRAVENOUS
  Filled 2020-09-30: qty 1

## 2020-09-30 MED ORDER — DIPHENHYDRAMINE HCL 50 MG/ML IJ SOLN: 25.0000 mg | Freq: Once | INTRAMUSCULAR | Status: AC

## 2020-09-30 MED ORDER — SODIUM CHLORIDE 0.9 % IV BOLUS
2000.0000 mL | Freq: Once | INTRAVENOUS | Status: AC
  Administered 2020-09-30: 2000 mL via INTRAVENOUS

## 2020-09-30 MED FILL — PROMETHAZINE 25 MG TABLET: 25 | 5 days supply | Qty: 20 | Fill #0

## 2020-09-30 NOTE — ED Provider Notes (Signed)
[facility] DEPART[facility] Provider Note   CSN: [id number] Arrival date & time: 09/30/20  0901     History Chief Complaint  Patient presents with  . Emesis    Sherry Key is a 35 y.o. female.  Patient presenting with persistent vomiting.  Consistent with her cyclical vomiting.  Past medical history significant for Crohn's disease endometriosis.  Patient states that she started vomiting 3 days ago.  Last seen for this in January 5 of this year.  And prior to that April 13 2020.  Chart review shows that cannabis is played a role with this.  Patient denies any vomiting of blood.  Patient is out of her Phenergan.        Past Medical History:  Diagnosis Date  . Crohn's disease ([place])   . Cyclical vomiting   . Endometriosis   . Fibroids   . History of partial hysterectomy 10/24/2019  . Hypertension   . PCOS (polycystic ovarian syndrome)     There are no problems to display for this patient.   Past Surgical History:  Procedure Laterality Date  . ABDOMINAL HYSTERECTOMY    . CESAREAN SECTION    . CESAREAN SECTION    . CHOLECYSTECTOMY    . HERNIA REPAIR    . TONSILLECTOMY    . TUBAL LIGATION    . TUBAL LIGATION       OB History   No obstetric history on file.     No family history on file.  Social History   Tobacco Use  . Smoking status: Former Smoker    Packs/day: 0.50    Types: Cigarettes  . Smokeless tobacco: Never Used  Vaping Use  . Vaping Use: Never used  Substance Use Topics  . Alcohol use: No  . Drug use: Not Currently    Types: Marijuana    Comment: last used 04-06-2020    Home Medications Prior to Admission medications   Medication Sig Start Date End Date Taking? Authorizing Provider  promethazine (PHENERGAN) 25 MG tablet Take 1 tablet (25 mg total) by mouth every 6 (six) hours as needed for nausea or vomiting. 09/30/20  Yes Fredia Sorrow, MD  flintstones complete (FLINTSTONES) 60 MG chewable tablet Chew 1 tablet by  mouth daily.      [provider]  HYDROcodone-acetaminophen (NORCO/VICODIN) 5-325 MG tablet Take 1 tablet by mouth every 4 (four) hours as needed. 01/20/20   Isla Pence, MD  ibuprofen (ADVIL,MOTRIN) 800 MG tablet Take 1 tablet (800 mg total) 3 (three) times daily by mouth. Patient not taking: Reported on 01/20/2020 05/25/17   Mesner, Corene Cornea, MD  promethazine (PHENERGAN) 25 MG suppository Place 1 suppository (25 mg total) rectally every 6 (six) hours as needed for nausea or vomiting. 04/13/20   Lucrezia Starch, MD  promethazine (PHENERGAN) 25 MG tablet Take 1 tablet (25 mg total) by mouth every 6 (six) hours as needed for nausea or vomiting. Patient not taking: Reported on 01/20/2020 11/18/19   Fransico Meadow, PA-C    Allergies    Dicyclomine, Morphine, Percocet [oxycodone-acetaminophen], Zofran [ondansetron hcl], Ketorolac, Other, Peanut-containing drug products, Percocet [oxycodone-acetaminophen], Tomato, Zofran, and Haloperidol  Review of Systems   Review of Systems  Constitutional: Negative for chills and fever.  HENT: Negative for congestion, rhinorrhea and sore throat.   Eyes: Negative for visual disturbance.  Respiratory: Negative for cough and shortness of breath.   Cardiovascular: Negative for chest pain and leg swelling.  Gastrointestinal: Positive for abdominal pain, nausea and  vomiting. Negative for diarrhea.  Genitourinary: Negative for dysuria.  Musculoskeletal: Negative for back pain and neck pain.  Skin: Negative for rash.  Neurological: Negative for dizziness, light-headedness and headaches.  Hematological: Does not bruise/bleed easily.  Psychiatric/Behavioral: Negative for confusion.    Physical Exam Updated Vital Signs BP (!) 133/101 (BP Location: Left Arm)   Pulse 75   Temp 97.9 F (36.6 C) (Oral)   Resp 16   Ht 1.6 m (5' 3" )   Wt 59 kg   LMP 10/19/2019   SpO2 100%   BMI 23.03 kg/m   Physical Exam Vitals and nursing note reviewed.   Constitutional:      General: She is not in acute distress.    Appearance: She is well-developed.  HENT:     Head: Normocephalic and atraumatic.  Eyes:     Extraocular Movements: Extraocular movements intact.     Conjunctiva/sclera: Conjunctivae normal.     Pupils: Pupils are equal, round, and reactive to light.  Cardiovascular:     Rate and Rhythm: Normal rate and regular rhythm.     Heart sounds: No murmur heard.   Pulmonary:     Effort: Pulmonary effort is normal. No respiratory distress.     Breath sounds: Normal breath sounds.  Abdominal:     Palpations: Abdomen is soft.     Tenderness: There is no abdominal tenderness.     Comments: Abdomen is nontender no guarding.  Musculoskeletal:        General: Normal range of motion.     Cervical back: Normal range of motion and neck supple.  Skin:    General: Skin is warm and dry.     Capillary Refill: Capillary refill takes less than 2 seconds.  Neurological:     General: No focal deficit present.     Mental Status: She is alert and oriented to person, place, and time.     ED Results / Procedures / Treatments   Labs (all labs ordered are listed, but only abnormal results are displayed) Labs Reviewed  COMPREHENSIVE METABOLIC PANEL - Abnormal; Notable for the following components:      Result Value   Glucose, Bld 125 (*)    All other components within normal limits  URINALYSIS, ROUTINE W REFLEX MICROSCOPIC - Abnormal; Notable for the following components:   Hgb urine dipstick TRACE (*)    All other components within normal limits  RAPID URINE DRUG SCREEN, HOSP PERFORMED - Abnormal; Notable for the following components:   Tetrahydrocannabinol POSITIVE (*)    All other components within normal limits  LIPASE, BLOOD  CBC  URINALYSIS, MICROSCOPIC (REFLEX)    EKG None  Radiology No results found.  Procedures Procedures   CRITICAL CARE Performed by: Fredia Sorrow Total critical care time: 35 minutes Critical  care time was exclusive of separately billable procedures and treating other patients. Critical care was necessary to treat or prevent imminent or life-threatening deterioration. Critical care was time spent personally by me on the following activities: development of treatment plan with patient and/or surrogate as well as nursing, discussions with consultants, evaluation of patient's response to treatment, examination of patient, obtaining history from patient or surrogate, ordering and performing treatments and interventions, ordering and review of laboratory studies, ordering and review of radiographic studies, pulse oximetry and re-evaluation of patient's condition.   Medications Ordered in ED Medications  0.9 %  sodium chloride infusion (0 mLs Intravenous Hold 09/30/20 1024)  sodium chloride 0.9 % bolus 2,000 mL (0 mLs  Intravenous Stopped 09/30/20 1308)  HYDROmorphone (DILAUDID) injection 1 mg (1 mg Intravenous Given 09/30/20 1030)  metoCLOPramide (REGLAN) injection 10 mg (10 mg Intravenous Given 09/30/20 1030)  diphenhydrAMINE (BENADRYL) injection 25 mg (25 mg Intravenous Given 09/30/20 1056)    ED Course  I have reviewed the triage vital signs and the nursing notes.  Pertinent labs & imaging results that were available during my care of the patient were reviewed by me and considered in my medical decision making (see chart for details).    MDM Rules/Calculators/A&P                           Patient's abdomen is soft no evidence of any acute abdominal process.  Patient labs here are significant for positive for THC.  No significant lecture light abnormalities.  And no leukocytosis.  Patient received 2 L of IV fluid bolus.  And as well as maintenance.  Received Reglan some Benadryl and hydromorphone with significant improvement.  Patient feels better ready for discharge home.  Requesting outpatient prescription for Phenergan which will be provided.   Final Clinical Impression(s) / ED  Diagnoses Final diagnoses:  Hyperemesis  Cannabis abuse    Rx / DC Orders ED Discharge Orders         Ordered    promethazine (PHENERGAN) 25 MG tablet  Every 6 hours PRN        09/30/20 1309           Fredia Sorrow, MD 09/30/20 1314

## 2020-09-30 NOTE — ED Triage Notes (Signed)
Reports hx of cyclical vomiting.  This episode has been going on for the last 3 days.  Tried to see PCP yesterday but was told to come to the ER.  Reports she has not had her phenergan or ativan in a while but she usually takes that.  Also c/o lower abdominal pain and diarrhea.

## 2020-09-30 NOTE — Discharge Instructions (Signed)
Abs here today very good.  Take the Phenergan as needed for nausea vomiting.  Return for any new or worse symptoms.  Work note provided

## 2020-09-30 NOTE — ED Notes (Signed)
Pt called out from room, diaphoretic, shaking, stating she is feeling anxious from the Reglan and has had this reaction in the past. Asking MD for benadryl order.

## 2020-10-21 ENCOUNTER — Emergency Department (HOSPITAL_BASED_OUTPATIENT_CLINIC_OR_DEPARTMENT_OTHER)
Admission: EM | Admit: 2020-10-21 | Discharge: 2020-10-21 | Disposition: A | Discharge status: Home / Self Care | Payer: Medicaid Other | Attending: Emergency Medicine | Admitting: Emergency Medicine

## 2020-10-21 ENCOUNTER — Other Ambulatory Visit: Payer: Self-pay

## 2020-10-21 ENCOUNTER — Encounter (HOSPITAL_BASED_OUTPATIENT_CLINIC_OR_DEPARTMENT_OTHER): Payer: Self-pay | Admitting: Emergency Medicine

## 2020-10-21 DIAGNOSIS — Z91010 Allergy to peanuts: Secondary | ICD-10-CM | POA: Diagnosis not present

## 2020-10-21 DIAGNOSIS — R109 Unspecified abdominal pain: Secondary | ICD-10-CM | POA: Insufficient documentation

## 2020-10-21 DIAGNOSIS — Z87891 Personal history of nicotine dependence: Secondary | ICD-10-CM | POA: Insufficient documentation

## 2020-10-21 DIAGNOSIS — I10 Essential (primary) hypertension: Secondary | ICD-10-CM | POA: Insufficient documentation

## 2020-10-21 DIAGNOSIS — R1110 Vomiting, unspecified: Secondary | ICD-10-CM | POA: Insufficient documentation

## 2020-10-21 DIAGNOSIS — R197 Diarrhea, unspecified: Secondary | ICD-10-CM | POA: Diagnosis not present

## 2020-10-21 HISTORY — DX: Cannabis use, unspecified, uncomplicated: F12.90

## 2020-10-21 HISTORY — DX: Nausea with vomiting, unspecified: R11.2

## 2020-10-21 HISTORY — DX: Cannabis hyperemesis syndrome: R11.16

## 2020-10-21 MED ORDER — METOCLOPRAMIDE HCL 5 MG/ML IJ SOLN
10.0000 mg | Freq: Once | INTRAMUSCULAR | Status: DC
  Filled 2020-10-21: qty 2

## 2020-10-21 MED ORDER — PROMETHAZINE HCL 25 MG/ML IJ SOLN
INTRAMUSCULAR | Status: AC
  Filled 2020-10-21: qty 1

## 2020-10-21 MED ORDER — LACTATED RINGERS IV BOLUS
1000.0000 mL | Freq: Once | INTRAVENOUS | Status: AC
  Administered 2020-10-21: 1000 mL via INTRAVENOUS

## 2020-10-21 MED ORDER — SODIUM CHLORIDE 0.9 % IV SOLN
12.5000 mg | Freq: Once | INTRAVENOUS | Status: AC
  Administered 2020-10-21: 12.5 mg via INTRAVENOUS
  Filled 2020-10-21: qty 0.5

## 2020-10-21 MED ORDER — DIPHENHYDRAMINE HCL 50 MG/ML IJ SOLN
50.0000 mg | Freq: Once | INTRAMUSCULAR | Status: DC
  Filled 2020-10-21: qty 1

## 2020-10-21 NOTE — Discharge Instructions (Addendum)
Please call your GI specialist today for follow-up

## 2020-10-21 NOTE — ED Triage Notes (Signed)
Pt with vomiting and diarrhea starting yesterday

## 2020-10-21 NOTE — ED Provider Notes (Signed)
[facility] EMERGENCY DEPARTMENT Provider Note   CSN: [id number] Arrival date & time: 10/21/20  0155     History Chief Complaint  Patient presents with  . Vomiting    Sherry Key is a 35 y.o. female.  The history is provided by the patient.  Emesis Severity:  Moderate Timing:  Intermittent Progression:  Worsening Chronicity:  Recurrent Relieved by:  Nothing Worsened by:  Nothing Associated symptoms: abdominal pain and diarrhea   Associated symptoms: no cough and no fever   Patient with history of cyclic vomiting syndrome, hypertension presents with vomiting and diarrhea.  She reports she was at work and ate a taco, and soon after began having multiple episodes of both nonbloody vomiting and diarrhea.  She also reports abdominal pain.  She reports this feels similar to prior episodes of cyclic vomiting.  Patient reports edible marijuana use      Past Medical History:  Diagnosis Date  . Crohn's disease ([place])   . Cyclical vomiting   . Endometriosis   . Fibroids   . History of partial hysterectomy 10/24/2019  . Hypertension   . PCOS (polycystic ovarian syndrome)     There are no problems to display for this patient.   Past Surgical History:  Procedure Laterality Date  . ABDOMINAL HYSTERECTOMY    . CESAREAN SECTION    . CESAREAN SECTION    . CHOLECYSTECTOMY    . HERNIA REPAIR    . TONSILLECTOMY    . TUBAL LIGATION    . TUBAL LIGATION       OB History   No obstetric history on file.     No family history on file.  Social History   Tobacco Use  . Smoking status: Former Smoker    Packs/day: 0.50    Types: Cigarettes  . Smokeless tobacco: Never Used  Vaping Use  . Vaping Use: Never used  Substance Use Topics  . Alcohol use: No  . Drug use: Not Currently    Types: Marijuana    Comment: last used 04-06-2020    Home Medications Prior to Admission medications   Medication Sig Start Date End Date Taking? Authorizing Provider   flintstones complete (FLINTSTONES) 60 MG chewable tablet Chew 1 tablet by mouth daily.      [provider]  promethazine (PHENERGAN) 25 MG tablet Take 1 tablet (25 mg total) by mouth every 6 (six) hours as needed for nausea or vomiting. 09/30/20   Fredia Sorrow, MD  promethazine (PHENERGAN) 25 MG tablet TAKE 1 TABLET (25 MG TOTAL) BY MOUTH EVERY 6 (SIX) HOURS AS NEEDED FOR NAUSEA OR VOMITING. 09/30/20 09/30/21  Fredia Sorrow, MD    Allergies    Dicyclomine, Morphine, Percocet [oxycodone-acetaminophen], Zofran Alvis Lemmings hcl], Ketorolac, Other, Peanut-containing drug products, Percocet [oxycodone-acetaminophen], Tomato, Zofran, and Haloperidol  Review of Systems   Review of Systems  Constitutional: Negative for fever.  Respiratory: Negative for cough.   Gastrointestinal: Positive for abdominal pain, diarrhea, nausea and vomiting. Negative for blood in stool.  Genitourinary: Negative for dysuria.  All other systems reviewed and are negative.   Physical Exam Updated Vital Signs BP (!) 158/108   Pulse 93   Temp 98.2 F (36.8 C) (Oral)   Resp 16   Ht 1.6 m (5' 3" )   Wt 56.7 kg   LMP 10/19/2019   SpO2 100%   BMI 22.14 kg/m   Physical Exam CONSTITUTIONAL: Well developed/well nourished HEAD: Normocephalic/atraumatic EYES: EOMI/PERRL, no icterus ENMT: Mucous membranes dry NECK: supple  no meningeal signs SPINE/BACK:entire spine nontender CV: S1/S2 noted, no murmurs/rubs/gallops noted LUNGS: Lungs are clear to auscultation bilaterally, no apparent distress ABDOMEN: soft, nontender, no rebound or guarding, bowel sounds noted throughout abdomen GU:no cva tenderness NEURO: Pt is awake/alert/appropriate, moves all extremitiesx4.  No facial droop.   EXTREMITIES: pulses normal/equal, full ROM SKIN: warm, color normal PSYCH: no abnormalities of mood noted, alert and oriented to situation  ED Results / Procedures / Treatments   Labs (all labs ordered are listed, but  only abnormal results are displayed) Labs Reviewed - No data to display  EKG None  Radiology No results found.  Procedures Procedures   Medications Ordered in ED Medications  promethazine (PHENERGAN) 25 MG/ML injection (has no administration in time range)  promethazine (PHENERGAN) 12.5 mg in sodium chloride 0.9 % 50 mL IVPB (has no administration in time range)  lactated ringers bolus 1,000 mL (1,000 mLs Intravenous New Bag/Given 10/21/20 0309)  promethazine (PHENERGAN) 12.5 mg in sodium chloride 0.9 % 50 mL IVPB (12.5 mg Intravenous New Bag/Given 10/21/20 0327)    ED Course  I have reviewed the triage vital signs and the nursing notes.     MDM Rules/Calculators/A&P                          2:47 AM Patient presents with vomiting and diarrhea that she reports is similar to previous episodes.  Overall patient is in no acute distress and no focal abdominal tenderness on exam.  She has had multiple ER visits for cyclic vomiting in the past.  Patient has extensive allergies which limits medications that can be used to treat her symptoms.  We will start with Benadryl and Reglan 4:01 AM Patient refused Reglan as she reports she also has an allergy to this medication.  Patient also has allergy to Zofran, Haldol, Toradol, dicyclomine This limits mu;tiple medications I can give patient.  Due to the cyclic nature of this illness, narcotics are not recommended. I advised that IV Phenergan is not recommended, but since it is the only medication that she can tolerate we will give another 12.5 mg per IV Patient is overall in no acute distress and no active vomiting at this time.  My suspicion for acute abdominal catastrophe is low She is hypertensive, though patient has a history of this in the past Final Clinical Impression(s) / ED Diagnoses Final diagnoses:  Hyperemesis    Rx / DC Orders ED Discharge Orders    None       Ripley Fraise, MD 10/21/20 0403

## 2020-11-11 ENCOUNTER — Other Ambulatory Visit (HOSPITAL_BASED_OUTPATIENT_CLINIC_OR_DEPARTMENT_OTHER): Payer: Self-pay

## 2020-11-11 MED FILL — Promethazine HCl Tab 25 MG: ORAL | 5 days supply | Qty: 20 | Fill #0 | Status: CN

## 2020-11-18 ENCOUNTER — Other Ambulatory Visit (HOSPITAL_BASED_OUTPATIENT_CLINIC_OR_DEPARTMENT_OTHER): Payer: Self-pay

## 2020-11-19 ENCOUNTER — Other Ambulatory Visit (HOSPITAL_BASED_OUTPATIENT_CLINIC_OR_DEPARTMENT_OTHER): Payer: Self-pay

## 2020-11-19 MED FILL — Promethazine HCl Tab 25 MG: ORAL | 5 days supply | Qty: 20 | Fill #0 | Status: AC

## 2020-12-07 ENCOUNTER — Emergency Department (HOSPITAL_BASED_OUTPATIENT_CLINIC_OR_DEPARTMENT_OTHER)
Admission: EM | Admit: 2020-12-07 | Discharge: 2020-12-07 | Disposition: A | Discharge status: Home / Self Care | Payer: Medicaid Other | Attending: Emergency Medicine | Admitting: Emergency Medicine

## 2020-12-07 ENCOUNTER — Encounter (HOSPITAL_BASED_OUTPATIENT_CLINIC_OR_DEPARTMENT_OTHER): Payer: Self-pay | Admitting: *Deleted

## 2020-12-07 ENCOUNTER — Other Ambulatory Visit: Payer: Self-pay

## 2020-12-07 DIAGNOSIS — R112 Nausea with vomiting, unspecified: Secondary | ICD-10-CM

## 2020-12-07 DIAGNOSIS — Z91010 Allergy to peanuts: Secondary | ICD-10-CM | POA: Insufficient documentation

## 2020-12-07 DIAGNOSIS — R519 Headache, unspecified: Secondary | ICD-10-CM | POA: Diagnosis not present

## 2020-12-07 DIAGNOSIS — I10 Essential (primary) hypertension: Secondary | ICD-10-CM | POA: Insufficient documentation

## 2020-12-07 DIAGNOSIS — Z87891 Personal history of nicotine dependence: Secondary | ICD-10-CM | POA: Insufficient documentation

## 2020-12-07 DIAGNOSIS — F1290 Cannabis use, unspecified, uncomplicated: Secondary | ICD-10-CM | POA: Diagnosis not present

## 2020-12-07 LAB — COMPREHENSIVE METABOLIC PANEL
ALT: 12 U/L (ref 0–44)
AST: 19 U/L (ref 15–41)
Albumin: 4.3 g/dL (ref 3.5–5.0)
Alkaline Phosphatase: 67 U/L (ref 38–126)
Anion gap: 10 (ref 5–15)
BUN: 10 mg/dL (ref 6–20)
CO2: 24 mmol/L (ref 22–32)
Calcium: 9.4 mg/dL (ref 8.9–10.3)
Chloride: 101 mmol/L (ref 98–111)
Creatinine, Ser: 0.78 mg/dL (ref 0.44–1.00)
GFR, Estimated: >60 mL/min (ref >60)
Glucose, Bld: 103 mg/dL — ABNORMAL HIGH (ref 70–99)
Potassium: 3.5 mmol/L (ref 3.5–5.1)
Sodium: 135 mmol/L (ref 135–145)
Total Bilirubin: 0.5 mg/dL (ref 0.3–1.2)
Total Protein: 8 g/dL (ref 6.5–8.1)

## 2020-12-07 LAB — URINALYSIS, ROUTINE W REFLEX MICROSCOPIC
Bilirubin Urine: NEGATIVE
Glucose, UA: NEGATIVE mg/dL
Ketones, ur: NEGATIVE mg/dL
Leukocytes,Ua: NEGATIVE
Nitrite: POSITIVE — AB
Protein, ur: NEGATIVE mg/dL
Specific Gravity, Urine: 1.025 (ref 1.005–1.030)
pH: 6.5 (ref 5.0–8.0)

## 2020-12-07 LAB — LIPASE, BLOOD: Lipase: 75 U/L — ABNORMAL HIGH (ref 11–51)

## 2020-12-07 LAB — URINALYSIS, MICROSCOPIC (REFLEX)

## 2020-12-07 LAB — PREGNANCY, URINE: Preg Test, Ur: NEGATIVE

## 2020-12-07 MED ORDER — DIPHENHYDRAMINE HCL 25 MG PO CAPS
25.0000 mg | ORAL_CAPSULE | Freq: Once | ORAL | Status: AC
  Administered 2020-12-07: 25 mg via ORAL
  Filled 2020-12-07: qty 1

## 2020-12-07 MED ORDER — SODIUM CHLORIDE 0.9 % IV SOLN
25.0000 mg | Freq: Four times a day (QID) | INTRAVENOUS | Status: DC | PRN
  Filled 2020-12-07: qty 1

## 2020-12-07 MED ORDER — PROMETHAZINE HCL 25 MG PO TABS: 25.0000 mg | ORAL_TABLET | Freq: Four times a day (QID) | ORAL | 1 refills | Status: DC | PRN

## 2020-12-07 MED ORDER — PROMETHAZINE HCL 25 MG/ML IJ SOLN
INTRAMUSCULAR | Status: AC
  Administered 2020-12-07: 25 mg via INTRAVENOUS
  Filled 2020-12-07: qty 1

## 2020-12-07 MED ORDER — LACTATED RINGERS IV BOLUS
1000.0000 mL | Freq: Once | INTRAVENOUS | Status: AC
  Administered 2020-12-07: 1000 mL via INTRAVENOUS

## 2020-12-07 NOTE — ED Notes (Signed)
Attempted IV access to [place] and left hand, tol well, unable to obtain blood for labs

## 2020-12-07 NOTE — Discharge Instructions (Signed)
I recommend that you hold off on any marijuana use for the next 3 months if your symptoms improve or your episodes of vomiting decrease I recommend stopping completely.  Please drink plenty of water.  I have prescribed you some additional Phenergan tablets to use at home if needed.  Please follow-up with your primary care provider.

## 2020-12-07 NOTE — ED Triage Notes (Signed)
Migraine headache for a week. Vomiting and light sensative.

## 2020-12-07 NOTE — ED Notes (Signed)
Pt. Reports headache and vomiting syndrome with 10 episodes of vomiting in last 24 hours.

## 2020-12-07 NOTE — ED Notes (Signed)
ED Provider at bedside. 

## 2020-12-07 NOTE — ED Provider Notes (Signed)
[facility] EMERGENCY DEPARTMENT Provider Note   CSN: [id number] Arrival date & time: 12/07/20  1243     History Chief Complaint  Patient presents with  . Headache  . Emesis    Sherry Key is a 35 y.o. female.  HPI Patient is a 35 year old female with past medical history significant for cannabinoid hyperemesis, Crohn's, cyclic vomiting, endometriosis, fibroids, hypertension, PCOS  Patient presented to headache and 10 episodes of vomiting past 24 hours.  She states that this is a frequent occurrence for her.  She states that she is using marijuana actively.  She states that she has tried to use her Phenergan without significant relief.  No bloody or bilious emesis.  She denies any lightheadedness dizziness chest pain shortness of breath.  She states that she does have achy abdominal pain which is constant, generalized achy severe worse with movement.  She denies any changes with her bowel movements.  States that she has been urinating darker urine over the past day but denies any dysuria, frequency, urgency, hematuria or hesitancy.  She denies any flank or back pain.  No vaginal discharge or concern for STDs or pregnancy.  No fevers or chills lightheadedness or dizziness.     Past Medical History:  Diagnosis Date  . Cannabinoid hyperemesis syndrome   . Crohn's disease ([place])   . Cyclical vomiting   . Endometriosis   . Fibroids   . History of partial hysterectomy 10/24/2019  . Hypertension   . PCOS (polycystic ovarian syndrome)     There are no problems to display for this patient.   Past Surgical History:  Procedure Laterality Date  . ABDOMINAL HYSTERECTOMY    . CESAREAN SECTION    . CESAREAN SECTION    . CHOLECYSTECTOMY    . HERNIA REPAIR    . TONSILLECTOMY    . TUBAL LIGATION    . TUBAL LIGATION       OB History   No obstetric history on file.     No family history on file.  Social History   Tobacco Use  . Smoking status: Former  Smoker    Packs/day: 0.50    Types: Cigarettes  . Smokeless tobacco: Never Used  Vaping Use  . Vaping Use: Never used  Substance Use Topics  . Alcohol use: No  . Drug use: Yes    Types: Marijuana    Home Medications Prior to Admission medications   Medication Sig Start Date End Date Taking? Authorizing Provider  flintstones complete (FLINTSTONES) 60 MG chewable tablet Chew 1 tablet by mouth daily.    [provider]  promethazine (PHENERGAN) 25 MG tablet Take 1 tablet (25 mg total) by mouth every 6 (six) hours as needed for nausea or vomiting. 12/07/20   Tedd Sias, PA    Allergies    Dicyclomine, Morphine, Percocet [oxycodone-acetaminophen], Zofran [ondansetron hcl], Ketorolac, Other, Peanut-containing drug products, Percocet [oxycodone-acetaminophen], Tomato, Zofran, and Haloperidol  Review of Systems   Review of Systems  Constitutional: Negative for chills and fever.  HENT: Negative for congestion.   Eyes: Negative for pain.  Respiratory: Negative for cough and shortness of breath.   Cardiovascular: Negative for chest pain and leg swelling.  Gastrointestinal: Positive for abdominal pain, nausea and vomiting.  Genitourinary: Negative for dysuria.  Musculoskeletal: Negative for myalgias.  Skin: Negative for rash.  Neurological: Negative for dizziness and headaches.    Physical Exam Updated Vital Signs BP 136/90   Pulse 73   Temp 98.6 F (  37 C) (Oral)   Resp 18   Ht 5' 3"  (1.6 m)   Wt 56.7 kg   LMP 10/19/2019   SpO2 100%   BMI 22.14 kg/m   Physical Exam Vitals and nursing note reviewed.  Constitutional:      General: She is in acute distress.     Comments: Retching but not actively vomiting.  HENT:     Head: Normocephalic and atraumatic.     Nose: Nose normal.     Mouth/Throat:     Mouth: Mucous membranes are dry.  Eyes:     General: No scleral icterus. Cardiovascular:     Rate and Rhythm: Normal rate and regular rhythm.     Pulses: Normal  pulses.     Heart sounds: Normal heart sounds.  Pulmonary:     Effort: Pulmonary effort is normal. No respiratory distress.     Breath sounds: No wheezing.  Abdominal:     Palpations: Abdomen is soft.     Tenderness: There is abdominal tenderness. There is no right CVA tenderness, left CVA tenderness, guarding or rebound.     Comments: Abdomen is soft, no guarding or rebound.  There is some generalized abdominal tenderness with deep palpation.  Musculoskeletal:     Cervical back: Normal range of motion.     Right lower leg: No edema.     Left lower leg: No edema.  Skin:    General: Skin is warm and dry.     Capillary Refill: Capillary refill takes less than 2 seconds.  Neurological:     Mental Status: She is alert. Mental status is at baseline.  Psychiatric:        Mood and Affect: Mood normal.        Behavior: Behavior normal.     ED Results / Procedures / Treatments   Labs (all labs ordered are listed, but only abnormal results are displayed) Labs Reviewed  COMPREHENSIVE METABOLIC PANEL - Abnormal; Notable for the following components:      Result Value   Glucose, Bld 103 (*)    All other components within normal limits  LIPASE, BLOOD - Abnormal; Notable for the following components:   Lipase 75 (*)    All other components within normal limits  URINALYSIS, ROUTINE W REFLEX MICROSCOPIC - Abnormal; Notable for the following components:   APPearance CLOUDY (*)    Hgb urine dipstick SMALL (*)    Nitrite POSITIVE (*)    All other components within normal limits  URINALYSIS, MICROSCOPIC (REFLEX) - Abnormal; Notable for the following components:   Bacteria, UA MANY (*)    All other components within normal limits  PREGNANCY, URINE    EKG None  Radiology No results found.  Procedures Procedures   Medications Ordered in ED Medications  promethazine (PHENERGAN) 25 mg in sodium chloride 0.9 % 50 mL IVPB (has no administration in time range)  lactated ringers bolus  1,000 mL (0 mLs Intravenous Stopped 12/07/20 1636)  promethazine (PHENERGAN) 25 MG/ML injection (25 mg Intravenous Given 12/07/20 1558)  diphenhydrAMINE (BENADRYL) capsule 25 mg (25 mg Oral Given 12/07/20 1610)    ED Course  I have reviewed the triage vital signs and the nursing notes.  Pertinent labs & imaging results that were available during my care of the patient were reviewed by me and considered in my medical decision making (see chart for details).    MDM Rules/Calculators/A&P  Patient is a uncomfortable but well-appearing nontoxic appearing 35 year old female presented today states that she has been vomiting intractably for the past 24 hours.  Patient is well-known to this ER.  I reviewed patient's EMR it seems that she was seen several days ago at outside hospital at that time she was requesting Dilaudid and Ativan which was not provided to patient.  Today she is requesting Ativan.  I offered Phenergan, droperidol or Ativan since she states that Zofran does not work for her. She states that she would prefer Phenergan.  She is also requesting IV Benadryl however I offered this to her as a p.o. medicine given that she is no longer vomiting however that Phenergan has been started.  There is no indication for CBC.  Obtained CMP, lipase, UA at her request.    Urine pregnancy test was negative.  Lipase mildly elevated but not high enough to be consistent with acute pancreatitis. Patient requested urinalysis because her urine has been darker.  I provided her reassurance that this is not a sign of a urinary tract infection however since urinalysis already been run I reviewed this which shows contaminated sample with squamous epithelial cells present as many as 20.  There are bacteria present but is leukocyte negative.  It is notably nitrate positive but this is unlikely to represent a urinary tract infection given her complete lack of symptoms.  Sure decision-making  oversedation patient is she is agreeable to not treat at this time.  Will discharge home with Phenergan.  If you develop urinary tract infection symptoms she will return to ER for reevaluation.  Requested patient follow-up with PCP.  Final Clinical Impression(s) / ED Diagnoses Final diagnoses:  Non-intractable vomiting with nausea, unspecified vomiting type  Marijuana use    Rx / DC Orders ED Discharge Orders         Ordered    promethazine (PHENERGAN) 25 MG tablet  Every 6 hours PRN        12/07/20 1627           Tedd Sias, [place] 12/07/20 1647    Truddie Hidden, MD 12/07/20 [phone]

## 2021-04-10 ENCOUNTER — Emergency Department (HOSPITAL_BASED_OUTPATIENT_CLINIC_OR_DEPARTMENT_OTHER): Payer: Medicaid Other

## 2021-04-10 ENCOUNTER — Other Ambulatory Visit: Payer: Self-pay

## 2021-04-10 ENCOUNTER — Encounter (HOSPITAL_BASED_OUTPATIENT_CLINIC_OR_DEPARTMENT_OTHER): Payer: Self-pay | Admitting: Emergency Medicine

## 2021-04-10 ENCOUNTER — Emergency Department (HOSPITAL_BASED_OUTPATIENT_CLINIC_OR_DEPARTMENT_OTHER)
Admission: EM | Admit: 2021-04-10 | Discharge: 2021-04-11 | Disposition: A | Discharge status: Home / Self Care | Payer: Medicaid Other | Attending: Emergency Medicine | Admitting: Emergency Medicine

## 2021-04-10 DIAGNOSIS — R1084 Generalized abdominal pain: Secondary | ICD-10-CM | POA: Diagnosis not present

## 2021-04-10 DIAGNOSIS — R000 Tachycardia, unspecified: Secondary | ICD-10-CM | POA: Insufficient documentation

## 2021-04-10 DIAGNOSIS — Z87891 Personal history of nicotine dependence: Secondary | ICD-10-CM | POA: Diagnosis not present

## 2021-04-10 DIAGNOSIS — R059 Cough, unspecified: Secondary | ICD-10-CM | POA: Diagnosis not present

## 2021-04-10 DIAGNOSIS — R079 Chest pain, unspecified: Secondary | ICD-10-CM | POA: Diagnosis not present

## 2021-04-10 DIAGNOSIS — Z91010 Allergy to peanuts: Secondary | ICD-10-CM | POA: Diagnosis not present

## 2021-04-10 DIAGNOSIS — N179 Acute kidney failure, unspecified: Secondary | ICD-10-CM

## 2021-04-10 DIAGNOSIS — I10 Essential (primary) hypertension: Secondary | ICD-10-CM | POA: Insufficient documentation

## 2021-04-10 DIAGNOSIS — R112 Nausea with vomiting, unspecified: Secondary | ICD-10-CM | POA: Diagnosis present

## 2021-04-10 DIAGNOSIS — Z20822 Contact with and (suspected) exposure to covid-19: Secondary | ICD-10-CM | POA: Diagnosis not present

## 2021-04-10 LAB — COMPREHENSIVE METABOLIC PANEL
ALT: 18 U/L (ref 0–44)
AST: 32 U/L (ref 15–41)
Albumin: 5.2 g/dL — ABNORMAL HIGH (ref 3.5–5.0)
Alkaline Phosphatase: 81 U/L (ref 38–126)
Anion gap: 13 (ref 5–15)
BUN: 40 mg/dL — ABNORMAL HIGH (ref 6–20)
CO2: 23 mmol/L (ref 22–32)
Calcium: 9.9 mg/dL (ref 8.9–10.3)
Chloride: 99 mmol/L (ref 98–111)
Creatinine, Ser: 2.06 mg/dL — ABNORMAL HIGH (ref 0.44–1.00)
GFR, Estimated: 31 mL/min — ABNORMAL LOW (ref >60)
Glucose, Bld: 123 mg/dL — ABNORMAL HIGH (ref 70–99)
Potassium: 4.1 mmol/L (ref 3.5–5.1)
Sodium: 135 mmol/L (ref 135–145)
Total Bilirubin: 1 mg/dL (ref 0.3–1.2)
Total Protein: 9.6 g/dL — ABNORMAL HIGH (ref 6.5–8.1)

## 2021-04-10 LAB — CBC WITH DIFFERENTIAL/PLATELET
Abs Immature Granulocytes: 0.14 10*3/uL — ABNORMAL HIGH (ref 0.00–0.07)
Basophils Absolute: 0.1 10*3/uL (ref 0.0–0.1)
Basophils Relative: 0 %
Eosinophils Absolute: 0 10*3/uL (ref 0.0–0.5)
Eosinophils Relative: 0 %
HCT: 48.2 % — ABNORMAL HIGH (ref 36.0–46.0)
Hemoglobin: 16.6 g/dL — ABNORMAL HIGH (ref 12.0–15.0)
Immature Granulocytes: 1 %
Lymphocytes Relative: 6 %
Lymphs Abs: 1.3 10*3/uL (ref 0.7–4.0)
MCH: 32.1 pg (ref 26.0–34.0)
MCHC: 34.4 g/dL (ref 30.0–36.0)
MCV: 93.2 fL (ref 80.0–100.0)
Monocytes Absolute: 1.4 10*3/uL — ABNORMAL HIGH (ref 0.1–1.0)
Monocytes Relative: 7 %
Neutro Abs: 17.7 10*3/uL — ABNORMAL HIGH (ref 1.7–7.7)
Neutrophils Relative %: 86 %
Platelets: 333 10*3/uL (ref 150–400)
RBC: 5.17 MIL/uL — ABNORMAL HIGH (ref 3.87–5.11)
RDW: 12.7 % (ref 11.5–15.5)
WBC: 20.6 10*3/uL — ABNORMAL HIGH (ref 4.0–10.5)
nRBC: 0 % (ref 0.0–0.2)

## 2021-04-10 LAB — RESP PANEL BY RT-PCR (FLU A&B, COVID) ARPGX2
Influenza A by PCR: NEGATIVE
Influenza B by PCR: NEGATIVE
SARS Coronavirus 2 by RT PCR: NEGATIVE

## 2021-04-10 LAB — TROPONIN I (HIGH SENSITIVITY)
Troponin I (High Sensitivity): 6 ng/L (ref <18)
Troponin I (High Sensitivity): 8 ng/L (ref <18)

## 2021-04-10 LAB — LIPASE, BLOOD: Lipase: 27 U/L (ref 11–51)

## 2021-04-10 MED ORDER — LACTATED RINGERS IV BOLUS
1000.0000 mL | Freq: Once | INTRAVENOUS | Status: AC
  Administered 2021-04-10: 1000 mL via INTRAVENOUS

## 2021-04-10 MED ORDER — SODIUM CHLORIDE 0.9 % IV SOLN
25.0000 mg | Freq: Four times a day (QID) | INTRAVENOUS | Status: DC | PRN
  Administered 2021-04-10: 25 mg via INTRAVENOUS
  Filled 2021-04-10: qty 1

## 2021-04-10 MED ORDER — SODIUM CHLORIDE 0.9 % IV BOLUS
1000.0000 mL | Freq: Once | INTRAVENOUS | Status: AC
  Administered 2021-04-10: 1000 mL via INTRAVENOUS

## 2021-04-10 MED ORDER — PROMETHAZINE HCL 25 MG/ML IJ SOLN
INTRAMUSCULAR | Status: AC
  Filled 2021-04-10: qty 1

## 2021-04-10 MED ORDER — LORAZEPAM 2 MG/ML IJ SOLN
1.0000 mg | Freq: Once | INTRAMUSCULAR | Status: AC
  Administered 2021-04-10: 1 mg via INTRAVENOUS
  Filled 2021-04-10: qty 1

## 2021-04-10 NOTE — ED Notes (Signed)
Pt. States unable to urinate at this time.

## 2021-04-10 NOTE — ED Notes (Addendum)
Pt. Has documented total hysterectomy 10/24/2019. Verbally confirmed by pt.

## 2021-04-10 NOTE — ED Notes (Signed)
Patient transported to CT 

## 2021-04-10 NOTE — ED Notes (Signed)
Waiting on pregnancy test for CT scan. RN made aware.

## 2021-04-10 NOTE — ED Triage Notes (Signed)
Pt reports NV since yesterday; c/o chest, back and BLE pain

## 2021-04-10 NOTE — ED Provider Notes (Signed)
  Provider Note MRN:  [id number]  Arrival date & time: 04/11/21    ED Course and Medical Decision Making  Assumed care from Dr. Melina Copa at shift change.  Suspect cyclical vomiting, however patient with leukocytosis and AKI, awaiting CT abdomen.  She is much more comfortable after interventions.  Awaiting urine, CT, will repeat BMP, if improving patient would be a candidate for discharge.  Repeat labs are reassuring.  CT scan is without acute process.  On reassessment patient is sleeping comfortably, clinically improved and symptoms controlled, now tolerating p.o.  Appropriate for discharge.  Procedures  Final Clinical Impressions(s) / ED Diagnoses     ICD-10-CM   1. Nausea and vomiting, unspecified vomiting type  R11.2       ED Discharge Orders          Ordered    promethazine (PHENERGAN) 25 MG tablet  Every 6 hours PRN        04/11/21 0147              Discharge Instructions      You were evaluated in the Emergency Department and after careful evaluation, we did not find any emergent condition requiring admission or further testing in the hospital.  Your exam/testing today was overall reassuring.  Important that you continue to hydrate at home by drinking lots of water.  Please return to the Emergency Department if you experience any worsening of your condition.  Thank you for allowing [place] to be a part of your care.       Barth Kirks. [place]onia Small, [place] [email or web address]    Maudie Flakes, MD 04/11/21 [phone]

## 2021-04-10 NOTE — ED Provider Notes (Signed)
[facility] Provider Note   CSN: [id number] Arrival date & time: 04/10/21  1941     History Chief Complaint  Patient presents with   Emesis    ALIENE TAMURA is a 35 y.o. female.  She is complaining of cough chest pain nausea vomiting back pain that started yesterday.  Cough is nonproductive.  She tells me she thinks she has pneumonia because she can feel the bubbles in her chest.  No known fever.  No blood in the vomitus.  No diarrhea or urinary symptoms.  She does have a history of cyclic vomiting.  She says that Ativan and Phenergan usually help.  She has multiple allergies.  The history is provided by the patient.  Emesis Severity:  Severe Duration:  2 days Timing:  Intermittent Quality:  Stomach contents Progression:  Unchanged Chronicity:  Recurrent Relieved by:  None tried Worsened by:  Nothing Ineffective treatments:  None tried Associated symptoms: abdominal pain and cough   Associated symptoms: no diarrhea, no fever, no headaches and no sore throat   Abdominal pain:    Location:  Generalized   Quality: stabbing     Severity:  Severe   Onset quality:  Gradual   Duration:  2 days   Timing:  Intermittent   Progression:  Unchanged   Chronicity:  Recurrent Risk factors: not pregnant and no sick contacts       Past Medical History:  Diagnosis Date   Cannabinoid hyperemesis syndrome    Crohn's disease ([place])    Cyclical vomiting    Endometriosis    Fibroids    History of partial hysterectomy 10/24/2019   Hypertension    PCOS (polycystic ovarian syndrome)     There are no problems to display for this patient.   Past Surgical History:  Procedure Laterality Date   ABDOMINAL HYSTERECTOMY     CESAREAN SECTION     CESAREAN SECTION     CHOLECYSTECTOMY     HERNIA REPAIR     TONSILLECTOMY     TUBAL LIGATION     TUBAL LIGATION       OB History   No obstetric history on file.     No family history on file.  Social  History   Tobacco Use   Smoking status: Former    Packs/day: 0.50    Types: Cigarettes   Smokeless tobacco: Never  Vaping Use   Vaping Use: Never used  Substance Use Topics   Alcohol use: No   Drug use: Yes    Types: Marijuana    Home Medications Prior to Admission medications   Medication Sig Start Date End Date Taking? Authorizing Provider  flintstones complete (FLINTSTONES) 60 MG chewable tablet Chew 1 tablet by mouth daily.    [provider]  promethazine (PHENERGAN) 25 MG tablet Take 1 tablet (25 mg total) by mouth every 6 (six) hours as needed for nausea or vomiting. 12/07/20   Tedd Sias, PA    Allergies    Dicyclomine, Morphine, Percocet [oxycodone-acetaminophen], Zofran [ondansetron hcl], Ketorolac, Other, Peanut-containing drug products, Percocet [oxycodone-acetaminophen], Tomato, Zofran, and Haloperidol  Review of Systems   Review of Systems  Constitutional:  Negative for fever.  HENT:  Negative for sore throat.   Eyes:  Negative for visual disturbance.  Respiratory:  Positive for cough. Negative for shortness of breath.   Cardiovascular:  Positive for chest pain.  Gastrointestinal:  Positive for abdominal pain, nausea and vomiting. Negative for diarrhea.  Genitourinary:  Negative for dysuria.  Musculoskeletal:  Positive for back pain.  Skin:  Negative for rash.  Neurological:  Negative for headaches.   Physical Exam Updated Vital Signs BP (!) 158/134   Pulse (!) 113   Temp 98 F (36.7 C) (Oral)   Resp 20   Ht 5' 3"  (1.6 m)   Wt 59 kg   LMP 10/19/2019   SpO2 98%   BMI 23.03 kg/m   Physical Exam Vitals and nursing note reviewed.  Constitutional:      General: She is not in acute distress.    Appearance: Normal appearance. She is well-developed.  HENT:     Head: Normocephalic and atraumatic.  Eyes:     Conjunctiva/sclera: Conjunctivae normal.  Cardiovascular:     Rate and Rhythm: Regular rhythm. Tachycardia present.     Heart  sounds: No murmur heard. Pulmonary:     Effort: Pulmonary effort is normal. No respiratory distress.     Breath sounds: Normal breath sounds.  Abdominal:     Palpations: Abdomen is soft.     Tenderness: There is abdominal tenderness (generalized). There is no guarding or rebound.  Musculoskeletal:        General: No deformity or signs of injury. Normal range of motion.     Cervical back: Neck supple.  Skin:    General: Skin is warm and dry.  Neurological:     General: No focal deficit present.     Mental Status: She is alert.    ED Results / Procedures / Treatments   Labs (all labs ordered are listed, but only abnormal results are displayed) Labs Reviewed  COMPREHENSIVE METABOLIC PANEL - Abnormal; Notable for the following components:      Result Value   Glucose, Bld 123 (*)    BUN 40 (*)    Creatinine, Ser 2.06 (*)    Total Protein 9.6 (*)    Albumin 5.2 (*)    GFR, Estimated 31 (*)    All other components within normal limits  CBC WITH DIFFERENTIAL/PLATELET - Abnormal; Notable for the following components:   WBC 20.6 (*)    RBC 5.17 (*)    Hemoglobin 16.6 (*)    HCT 48.2 (*)    Neutro Abs 17.7 (*)    Monocytes Absolute 1.4 (*)    Abs Immature Granulocytes 0.14 (*)    All other components within normal limits  BASIC METABOLIC PANEL - Abnormal; Notable for the following components:   CO2 20 (*)    Glucose, Bld 100 (*)    BUN 30 (*)    Creatinine, Ser 1.31 (*)    Calcium 8.4 (*)    GFR, Estimated 54 (*)    All other components within normal limits  RESP PANEL BY RT-PCR (FLU A&B, COVID) ARPGX2  LIPASE, BLOOD  TROPONIN I (HIGH SENSITIVITY)  TROPONIN I (HIGH SENSITIVITY)    EKG EKG Interpretation  Date/Time:  Sunday April 10 2021 19:58:55 EDT Ventricular Rate:  124 PR Interval:  132 QRS Duration: 78 QT Interval:  306 QTC Calculation: 439 R Axis:   90 Text Interpretation: Sinus tachycardia Biatrial enlargement Rightward axis Pulmonary disease pattern  Biventricular hypertrophy Nonspecific ST and T wave abnormality Abnormal ECG increased rate and ST, T changes from prior 1/22 Confirmed by Aletta Edouard [phone]) on 04/10/2021 8:00:36 PM  Radiology CT Abdomen Pelvis Wo Contrast  Result Date: 04/10/2021 CLINICAL DATA:  Abdominal pain and nausea and vomiting beginning yesterday. EXAM: CT ABDOMEN AND PELVIS WITHOUT CONTRAST TECHNIQUE: Multidetector  CT imaging of the abdomen and pelvis was performed following the standard protocol without IV contrast. COMPARISON:  02/09/2020 FINDINGS: Lower chest: No acute findings. Hepatobiliary: No mass visualized on this unenhanced exam. Prior cholecystectomy. No evidence of biliary obstruction. Pancreas: No mass or inflammatory process visualized on this unenhanced exam. Spleen:  Within normal limits in size. Adrenals/Urinary tract: No evidence of urolithiasis or hydronephrosis. Unremarkable unopacified urinary bladder. Stomach/Bowel: No evidence of obstruction, inflammatory process, or abnormal fluid collections. Normal appendix visualized. Vascular/Lymphatic: No pathologically enlarged lymph nodes identified. No evidence of abdominal aortic aneurysm. Reproductive: Prior hysterectomy noted. Adnexal regions are unremarkable in appearance. Other:  None. Musculoskeletal: No suspicious bone lesions identified. Old compression fracture of the superior endplate of the L1 vertebral body is unchanged in appearance. IMPRESSION: No evidence of urolithiasis, hydronephrosis, or other acute findings. Electronically Signed   By: Marlaine Hind M.D.   On: 04/10/2021 23:53   DG Chest Port 1 View  Result Date: 04/10/2021 CLINICAL DATA:  Chest pain.  Nausea and vomiting. EXAM: PORTABLE CHEST 1 VIEW COMPARISON:  01/20/2020 CT FINDINGS: The heart size and mediastinal contours are within normal limits. Both lungs are clear. The visualized skeletal structures are unremarkable. IMPRESSION: No active disease. Electronically Signed   By: Nolon Nations M.D.   On: 04/10/2021 20:57    Procedures Procedures   Medications Ordered in ED Medications  sodium chloride 0.9 % bolus 1,000 mL (has no administration in time range)  promethazine (PHENERGAN) 25 mg in sodium chloride 0.9 % 50 mL IVPB (has no administration in time range)  LORazepam (ATIVAN) injection 1 mg (has no administration in time range)    ED Course  I have reviewed the triage vital signs and the nursing notes.  Pertinent labs & imaging results that were available during my care of the patient were reviewed by me and considered in my medical decision making (see chart for details).  Clinical Course as of 04/11/21 1645  Sun Apr 10, 2021  2137 She resting quietly.  Have ordered her more IV fluids. [MB]    Clinical Course User Index [MB] Hayden Rasmussen, MD   MDM Rules/Calculators/A&P                          This patient complains of chest pain back pain abdominal pain nausea and vomiting; this involves an extensive number of treatment Options and is a complaint that carries with it a high risk of complications and Morbidity. The differential includes pneumonia, ACS, cannabis hyperemesis, gastroparesis, dehydration obstruction, gastritis  I ordered, reviewed and interpreted labs, which included CBC with elevated white count, hemoglobin elevated concerning for possible infection dehydration, chemistries with low bicarb elevated BUN and creatinine reflecting some dehydration, COVID testing negative, urinalysis ordered, patient unable to provide, COVID and flu testing negative, troponins flat I ordered medication IV fluids Ativan Phenergan with improvement in her symptoms I ordered imaging studies which included chest x-ray and CT abdomen and pelvis without contrast and I independently    visualized and interpreted imaging which showed no acute findings on chest x-ray.  CT abdomen pelvis is pending at time of signout Previous records obtained and reviewed in epic,  patient has a couple of recent visits for intractable nausea and vomiting  After the interventions stated above, I reevaluated the patient and found patient to be resting more comfortably.  Her care is signed out to oncoming provider Dr. Sedonia Small to follow-up on response after IV  fluids and CT abdomen and pelvis.  Anticipate if AKI improving and patient tolerating p.o. she likely can be discharged. IVIONNA VERLEY was evaluated in Emergency Department on 04/10/2021 for the symptoms described in the history of present illness. She was evaluated in the context of the global COVID-19 pandemic, which necessitated consideration that the patient might be at risk for infection with the SARS-CoV-2 virus that causes COVID-19. Institutional protocols and algorithms that pertain to the evaluation of patients at risk for COVID-19 are in a state of rapid change based on information released by regulatory bodies including the CDC and federal and state organizations. These policies and algorithms were followed during the patient's care in the ED.     Final Clinical Impression(s) / ED Diagnoses Final diagnoses:  Nausea and vomiting, unspecified vomiting type  AKI (acute kidney injury) ([place])  Nonspecific chest pain    Rx / DC Orders ED Discharge Orders     None        Hayden Rasmussen, MD 04/11/21 1651

## 2021-04-11 LAB — BASIC METABOLIC PANEL
Anion gap: 10 (ref 5–15)
BUN: 30 mg/dL — ABNORMAL HIGH (ref 6–20)
CO2: 20 mmol/L — ABNORMAL LOW (ref 22–32)
Calcium: 8.4 mg/dL — ABNORMAL LOW (ref 8.9–10.3)
Chloride: 105 mmol/L (ref 98–111)
Creatinine, Ser: 1.31 mg/dL — ABNORMAL HIGH (ref 0.44–1.00)
GFR, Estimated: 54 mL/min — ABNORMAL LOW (ref >60)
Glucose, Bld: 100 mg/dL — ABNORMAL HIGH (ref 70–99)
Potassium: 3.6 mmol/L (ref 3.5–5.1)
Sodium: 135 mmol/L (ref 135–145)

## 2021-04-11 MED ORDER — PROMETHAZINE HCL 25 MG PO TABS: 25.0000 mg | ORAL_TABLET | Freq: Four times a day (QID) | ORAL | 1 refills | Status: DC | PRN

## 2021-04-11 MED ORDER — PROCHLORPERAZINE EDISYLATE 10 MG/2ML IJ SOLN
10.0000 mg | Freq: Once | INTRAMUSCULAR | Status: DC
  Filled 2021-04-11: qty 2

## 2021-04-11 NOTE — Discharge Instructions (Signed)
You were evaluated in the Emergency Department and after careful evaluation, we did not find any emergent condition requiring admission or further testing in the hospital.  Your exam/testing today was overall reassuring.  Important that you continue to hydrate at home by drinking lots of water.  Please return to the Emergency Department if you experience any worsening of your condition.  Thank you for allowing [place] to be a part of your care.

## 2021-04-11 NOTE — ED Notes (Signed)
Report given and care transferred to [facility]

## 2021-10-06 IMAGING — CT CT ABD-PELV W/ CM
2 of 4 series · 16 of 46 positions shown, 18 images · IV contrast (omnipaque)
Comparison: CT abdomen pelvis 12/18/2018

CLINICAL DATA: Abdominal pain. Nausea and vomiting

EXAM:
CT ABDOMEN AND PELVIS WITH CONTRAST
TECHNIQUE: Multidetector CT imaging of the abdomen and pelvis was performed
using the standard protocol following bolus administration of
intravenous contrast.
CONTRAST:  100mL OMNIPAQUE IOHEXOL 300 MG/ML  SOLN

[Series 2: axial st · axial · 0.74mm/px · z∈[-406,+4]mm · 13 of 90 slices shown, 15 images]
[im 4/90  soft-tissue]
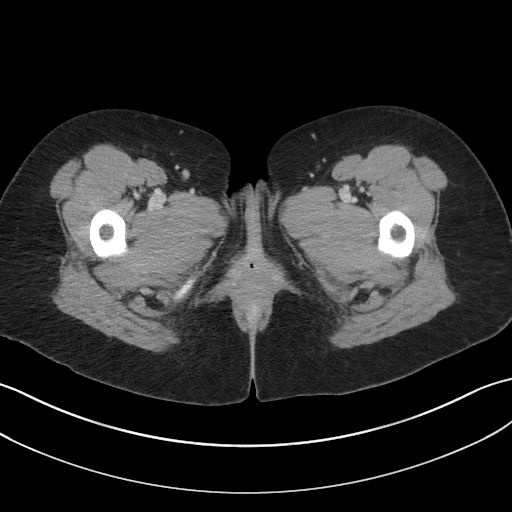
[im 4/90  bone]
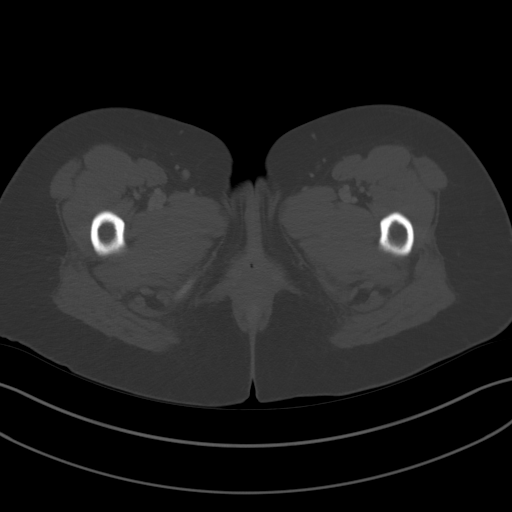
[im 12/90  soft-tissue]
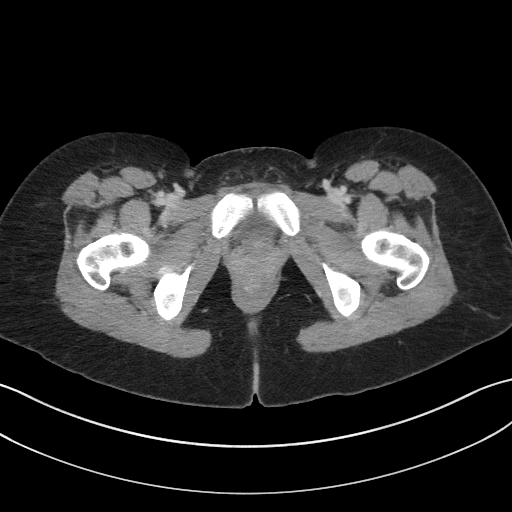
[im 19/90  soft-tissue]
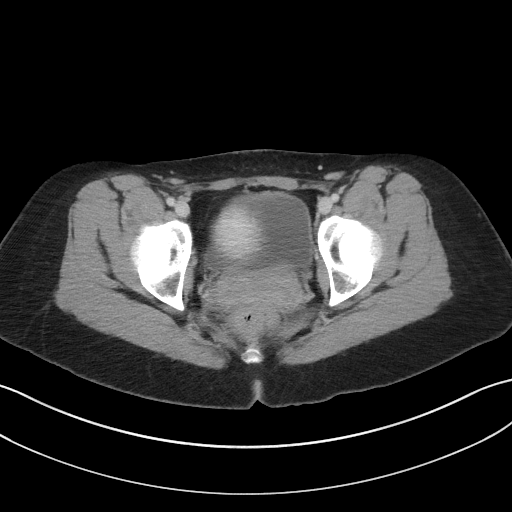
[im 26/90  soft-tissue]
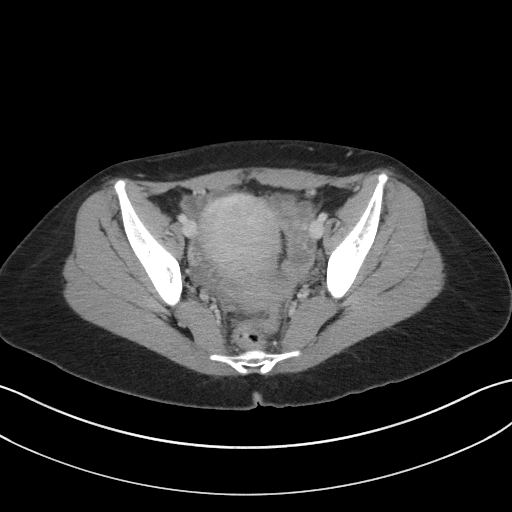
[im 30/90  soft-tissue]
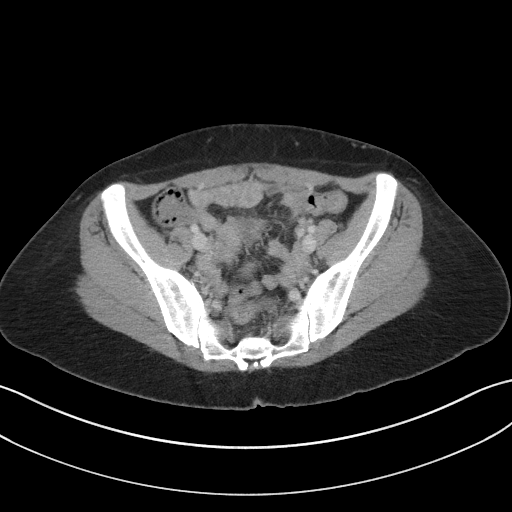
[im 38/90  soft-tissue]
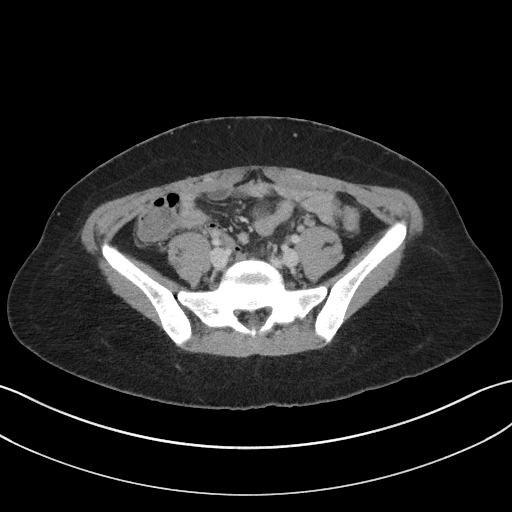
[im 45/90  soft-tissue]
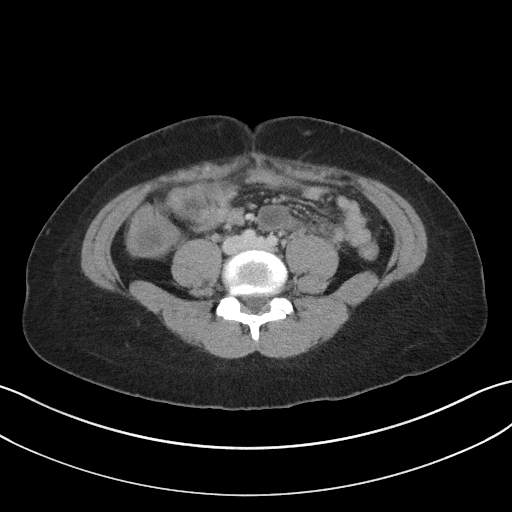
[im 52/90  soft-tissue]
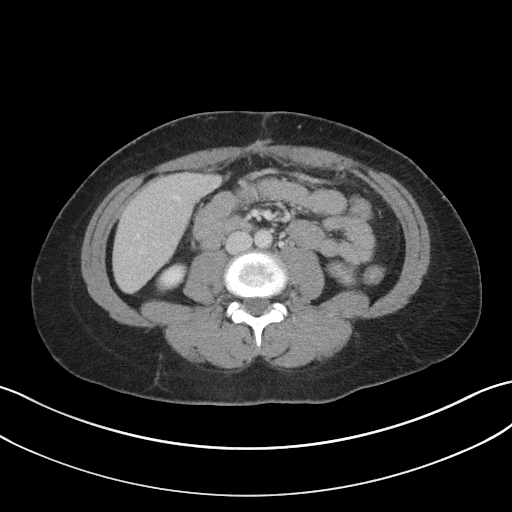
[im 60/90  soft-tissue]
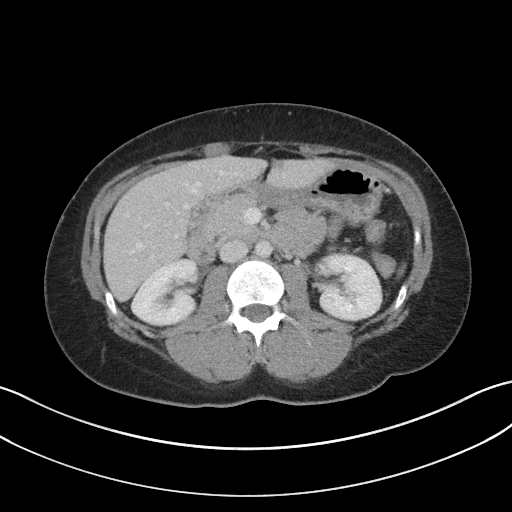
[im 60/90  bone]
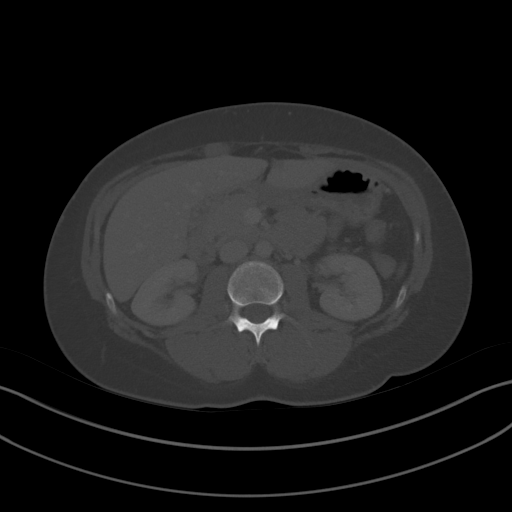
[im 64/90  soft-tissue]
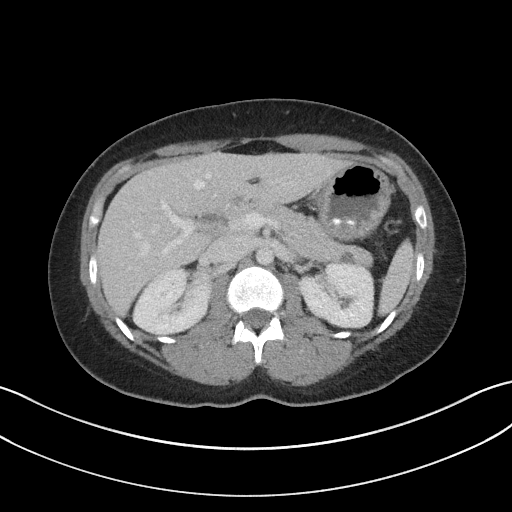
[im 71/90  soft-tissue]
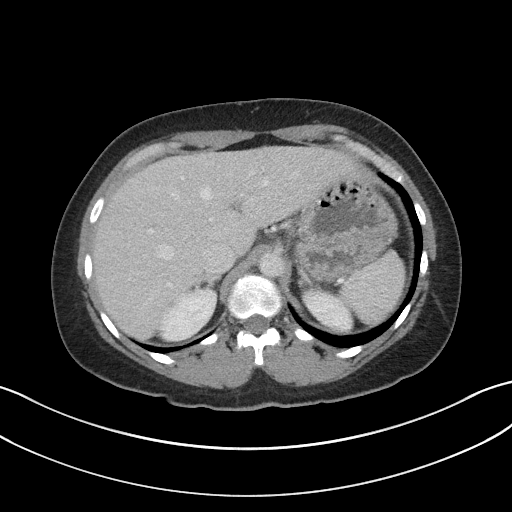
[im 78/90  soft-tissue]
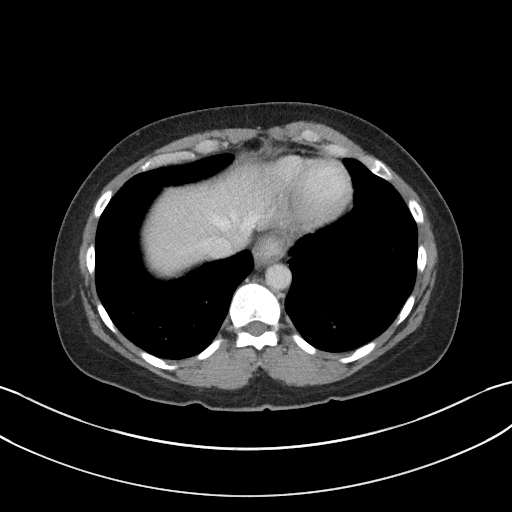
[im 86/90  soft-tissue]
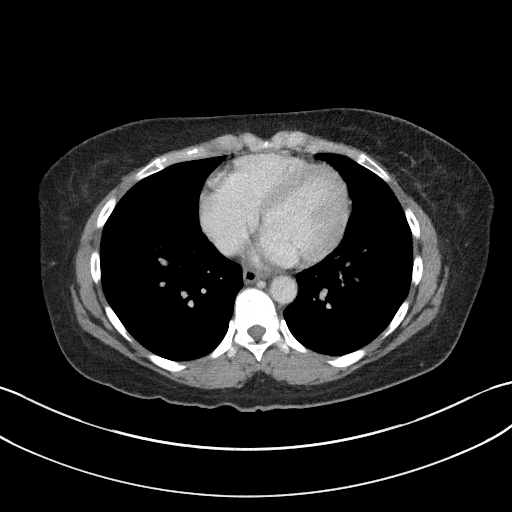

[Series 5: coronal st · coronal · 0.73mm/px · 3 of 69 slices shown]
[im 23/69  soft-tissue]
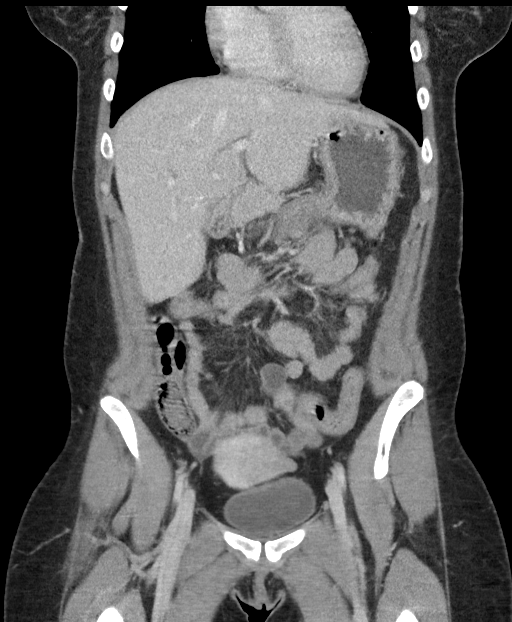
[im 31/69  soft-tissue]
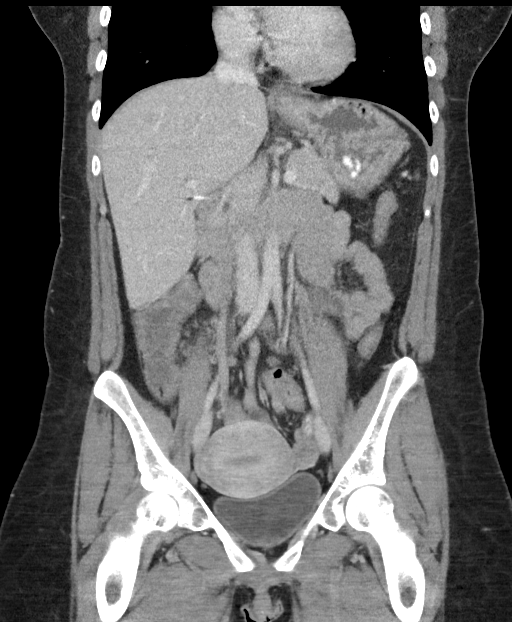
[im 38/69  soft-tissue]
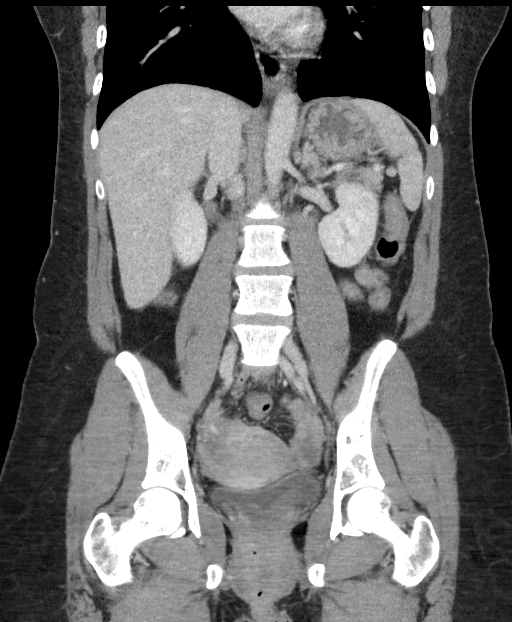

[16 of 46 positions shown; findings below may reference images not displayed]

FINDINGS: LOWER CHEST: No basilar pleural or apical pericardial effusion.

HEPATOBILIARY: Normal hepatic contours. There is no intra- or
extrahepatic biliary dilatation. Status post cholecystectomy.

PANCREAS: Normal pancreatic contours without pancreatic ductal
dilatation or peripancreatic fluid collection.

SPLEEN: Normal.

ADRENALS/URINARY TRACT:

--Adrenal glands: Normal.

--Right kidney/ureter: No hydronephrosis, nephroureterolithiasis or
solid renal mass.

--Left kidney/ureter: No hydronephrosis, nephroureterolithiasis or
solid renal mass.

--Urinary bladder: Normal for degree of distention

STOMACH/BOWEL:

--Stomach/Duodenum: There is no hiatal hernia. The duodenal course
and caliber are normal.

--Small bowel: No dilatation or inflammation.

--Colon: No focal abnormality.

--Appendix: Normal.

VASCULAR/LYMPHATIC: Normal course and caliber of the major abdominal
vessels. No abdominal or pelvic lymphadenopathy.

REPRODUCTIVE: Normal uterus and ovaries.

MUSCULOSKELETAL. No bony spinal canal stenosis or focal osseous
abnormality.

OTHER: None.
IMPRESSION: No acute abdominopelvic abnormality.

## 2021-10-19 ENCOUNTER — Encounter (HOSPITAL_BASED_OUTPATIENT_CLINIC_OR_DEPARTMENT_OTHER): Payer: Self-pay | Admitting: Emergency Medicine

## 2021-10-19 ENCOUNTER — Other Ambulatory Visit: Payer: Self-pay

## 2021-10-19 ENCOUNTER — Emergency Department (HOSPITAL_BASED_OUTPATIENT_CLINIC_OR_DEPARTMENT_OTHER)
Admission: EM | Admit: 2021-10-19 | Discharge: 2021-10-20 | Disposition: A | Discharge status: Home / Self Care | Payer: Medicaid Other | Attending: Emergency Medicine | Admitting: Emergency Medicine

## 2021-10-19 DIAGNOSIS — R109 Unspecified abdominal pain: Secondary | ICD-10-CM | POA: Insufficient documentation

## 2021-10-19 DIAGNOSIS — I10 Essential (primary) hypertension: Secondary | ICD-10-CM | POA: Insufficient documentation

## 2021-10-19 DIAGNOSIS — E860 Dehydration: Secondary | ICD-10-CM | POA: Diagnosis not present

## 2021-10-19 DIAGNOSIS — R112 Nausea with vomiting, unspecified: Secondary | ICD-10-CM | POA: Insufficient documentation

## 2021-10-19 MED ORDER — LORAZEPAM 2 MG/ML IJ SOLN
1.0000 mg | Freq: Once | INTRAMUSCULAR | Status: AC
  Administered 2021-10-20: 1 mg via INTRAVENOUS
  Filled 2021-10-19: qty 1

## 2021-10-19 MED ORDER — SODIUM CHLORIDE 0.9 % IV BOLUS
1000.0000 mL | Freq: Once | INTRAVENOUS | Status: AC
  Administered 2021-10-20: 1000 mL via INTRAVENOUS

## 2021-10-19 NOTE — ED Provider Notes (Signed)
? ?Emergency Department Provider Note ? ? ?I have reviewed the triage vital signs and the nursing notes. ? ? ?HISTORY ? ?Chief Complaint ?Emesis and Nausea ? ? ?HPI ?Sherry Key is a 36 y.o. female with PMH reviewed below presents to the ED with nausea and vomiting.  Patient has had similar issues like this in the past.  She describes cramping abdominal pain with nausea vomiting starting yesterday.  She has been "partying" with family and did drink and use marijuana within the past week but is not using it consistently.  She states she has had return of nausea vomiting and is out of her Phenergan.  In the past Ativan has helped her.  She does describe being under "a lot of stress" which she thinks may be a trigger for this.  No chest pain or shortness of breath.  No fevers or chills.  No UTI symptoms. ? ? ?Past Medical History:  ?Diagnosis Date  ? Cannabinoid hyperemesis syndrome   ? Crohn's disease ([place])   ? Cyclical vomiting   ? Endometriosis   ? Fibroids   ? History of partial hysterectomy 10/24/2019  ? Hypertension   ? PCOS (polycystic ovarian syndrome)   ? ? ?Review of Systems ? ?Constitutional: No fever/chills ?Eyes: No visual changes. ?ENT: No sore throat. ?Cardiovascular: Denies chest pain. ?Respiratory: Denies shortness of breath. ?Gastrointestinal: Positive abdominal pain. Positive nausea and vomiting.  No diarrhea.  No constipation. ?Musculoskeletal: Negative for back pain. ?Skin: Negative for rash. ?Neurological: Negative for headaches ? ? ?____________________________________________ ? ? ?PHYSICAL EXAM: ? ?VITAL SIGNS: ?ED Triage Vitals  ?Enc Vitals Group  ?   BP 10/19/21 2254 (!) 173/139  ?   Pulse Rate 10/19/21 2254 (!) 128  ?   Resp 10/19/21 2254 20  ?   Temp 10/19/21 2254 98.6 ?F (37 ?C)  ?   Temp src --   ?   SpO2 10/19/21 2254 100 %  ?   Weight 10/19/21 2250 150 lb (68 kg)  ?   Height 10/19/21 2250 5' 3"  (1.6 m)  ? ?Constitutional: Alert and oriented. Well appearing and in no acute  distress. ?Eyes: Conjunctivae are normal.  ?Head: Atraumatic. ?Nose: No congestion/rhinnorhea. ?Mouth/Throat: Mucous membranes are moist. ?Neck: No stridor.  ?Cardiovascular: Normal rate, regular rhythm. Good peripheral circulation. Grossly normal heart sounds.   ?Respiratory: Normal respiratory effort.  No retractions. Lungs CTAB. ?Gastrointestinal: Soft and nontender. No distention.  ?Musculoskeletal: No lower extremity tenderness nor edema. No gross deformities of extremities. ?Neurologic:  Normal speech and language. No gross focal neurologic deficits are appreciated.  ?Skin:  Skin is warm, dry and intact. No rash noted. ? ?____________________________________________ ?  ?LABS ?(all labs ordered are listed, but only abnormal results are displayed) ? ?Labs Reviewed  ?COMPREHENSIVE METABOLIC PANEL - Abnormal; Notable for the following components:  ?    Result Value  ? Potassium 3.4 (*)   ? CO2 19 (*)   ? Glucose, Bld 115 (*)   ? BUN 29 (*)   ? Creatinine, Ser 1.28 (*)   ? Total Protein 9.4 (*)   ? GFR, Estimated 56 (*)   ? Anion gap 18 (*)   ? All other components within normal limits  ?CBC WITH DIFFERENTIAL/PLATELET - Abnormal; Notable for the following components:  ? WBC 20.3 (*)   ? Hemoglobin 15.7 (*)   ? Neutro Abs 18.4 (*)   ? Abs Immature Granulocytes 0.09 (*)   ? All other components within normal limits  ?BASIC  METABOLIC PANEL - Abnormal; Notable for the following components:  ? Potassium 3.4 (*)   ? CO2 21 (*)   ? BUN 25 (*)   ? Creatinine, Ser 1.12 (*)   ? Calcium 8.4 (*)   ? All other components within normal limits  ?LIPASE, BLOOD  ? ?____________________________________________ ? ?EKG ? ? EKG Interpretation ? ?Date/Time:  Wednesday October 19 2021 23:22:24 EDT ?Ventricular Rate:  125 ?PR Interval:  66 ?QRS Duration: 81 ?QT Interval:  404 ?QTC Calculation: 583 ?R Axis:   85 ?Text Interpretation: Sinus tachycardia LAE, consider biatrial enlargement Left ventricular hypertrophy Prolonged QT interval  Baseline wander in lead(s) III Confirmed by Nanda Quinton [phone]) on 10/19/2021 11:33:51 PM ?  ? ?  ? ? ?____________________________________________ ? ? ?PROCEDURES ? ?Procedure(s) performed:  ? ?Procedures ? ?None  ?____________________________________________ ? ? ?INITIAL IMPRESSION / ASSESSMENT AND PLAN / ED COURSE ? ?Pertinent labs & imaging results that were available during my care of the patient were reviewed by me and considered in my medical decision making (see chart for details). ?  ?This patient is Presenting for Evaluation of abdominal pain, which does require a range of treatment options, and is a complaint that involves a high risk of morbidity and mortality. ? ?The Differential Diagnoses  includes but is not exclusive to acute cholecystitis, intrathoracic causes for epigastric abdominal pain, gastritis, duodenitis, pancreatitis, small bowel or large bowel obstruction, abdominal aortic aneurysm, hernia, gastritis, etc. ? ? ?Critical Interventions-  ?  ?Medications  ?sodium chloride 0.9 % bolus 1,000 mL ( Intravenous Stopped 10/20/21 0127)  ?LORazepam (ATIVAN) injection 1 mg (1 mg Intravenous Given 10/20/21 0018)  ?sodium chloride 0.9 % bolus 1,000 mL ( Intravenous Stopped 10/20/21 0228)  ? ? ?Reassessment after intervention: Nausea improved and HR improved with IVF.  ? ? ?I decided to review pertinent External Data, and in summary last ED visit in our system was October 2022 with similar presentation. ?  ?Clinical Laboratory Tests Ordered, included CBC shows leukocytosis to 20.3.  Hemoconcentration suspected.  In review of prior ED visits patient often has leukocytosis sometimes as high as 23 and prior evaluations.  Doubt acute infectious process.  Creatinine of 1.28 similar to prior.  Patient does have a CO2 of 19 as well as an anion gap.  Lipase is normal.  Plan for repeat basic metabolic panel after additional IV fluids ? ?Repeat BMP with closed anion gap and improved CO2. ? ?Radiologic Tests:  Considered need for abdominal imaging.  In review of prior records patient has had multiple ED presentations with similar symptoms and lab values.  Her abdomen remains soft with no peritoneal findings.  No focal tenderness.  Patient has had multiple CT scans of her abdomen and pelvis along with other plain film images.  Defer imaging for now.  ? ?Cardiac Monitor Tracing which shows NSR. ? ? ?Social Determinants of Health Risk positive EtOH and marijuana use. ? ?Medical Decision Making: Summary:  ?Patient presents emergency department with cramping abdominal pain along with nausea and vomiting.  Suspect this may be related to substance use/alcohol use.  Pancreatitis is a consideration.  Abdomen is diffusely soft and minimally tender.  No peritonitis.  Plan for Ativan after discussion with patient along with IV fluids.  IV Phenergan is on national backorder.  She has several allergies to other medications which was reviewed.  ? ?Reevaluation with update and discussion with patient. Symptoms improved after ativan. Plan for additional IVF bolus and re-check BMP with mild acidosis.  Doubt sepsis clinically although considered.  ? ?Patient feeling better after treatment in the ED. Patient calling for a ride home.  ? ?Disposition: discharge ? ?____________________________________________ ? ?FINAL CLINICAL IMPRESSION(S) / ED DIAGNOSES ? ?Final diagnoses:  ?Nausea and vomiting, unspecified vomiting type  ?Dehydration  ? ? ? ?NEW OUTPATIENT MEDICATIONS STARTED DURING THIS VISIT: ? ?Discharge Medication List as of 10/20/2021  3:13 AM  ?  ? ?START taking these medications  ? Details  ?Doxylamine-Pyridoxine 10-10 MG TBEC Take 1 tablet by mouth every 8 (eight) hours as needed., Starting Thu 10/20/2021, Normal  ?  ?  ? ? ?Note:  This document was prepared using Dragon voice recognition software and may include unintentional dictation errors. ? ?Nanda Quinton, MD, FACEP ?Emergency Medicine ? ?  ?Margette Fast, MD ?10/20/21 [phone] ? ?

## 2021-10-19 NOTE — ED Triage Notes (Signed)
Patient arrived via POV c/o nausea and vomiting x 1 day. Patient actively vomiting in triage. Patient states being out of nausea medicine. Patient is AO x 4, VS with elevated BP and HR, in wheel chair. ?

## 2021-10-20 LAB — CBC WITH DIFFERENTIAL/PLATELET
Abs Immature Granulocytes: 0.09 10*3/uL — ABNORMAL HIGH (ref 0.00–0.07)
Basophils Absolute: 0.1 10*3/uL (ref 0.0–0.1)
Basophils Relative: 0 %
Eosinophils Absolute: 0 10*3/uL (ref 0.0–0.5)
Eosinophils Relative: 0 %
HCT: 44.1 % (ref 36.0–46.0)
Hemoglobin: 15.7 g/dL — ABNORMAL HIGH (ref 12.0–15.0)
Immature Granulocytes: 0 %
Lymphocytes Relative: 4 %
Lymphs Abs: 0.8 10*3/uL (ref 0.7–4.0)
MCH: 32.2 pg (ref 26.0–34.0)
MCHC: 35.6 g/dL (ref 30.0–36.0)
MCV: 90.4 fL (ref 80.0–100.0)
Monocytes Absolute: 1 10*3/uL (ref 0.1–1.0)
Monocytes Relative: 5 %
Neutro Abs: 18.4 10*3/uL — ABNORMAL HIGH (ref 1.7–7.7)
Neutrophils Relative %: 91 %
Platelets: 294 10*3/uL (ref 150–400)
RBC: 4.88 MIL/uL (ref 3.87–5.11)
RDW: 12 % (ref 11.5–15.5)
WBC: 20.3 10*3/uL — ABNORMAL HIGH (ref 4.0–10.5)
nRBC: 0 % (ref 0.0–0.2)

## 2021-10-20 LAB — COMPREHENSIVE METABOLIC PANEL
ALT: 14 U/L (ref 0–44)
AST: 22 U/L (ref 15–41)
Albumin: 5 g/dL (ref 3.5–5.0)
Alkaline Phosphatase: 89 U/L (ref 38–126)
Anion gap: 18 — ABNORMAL HIGH (ref 5–15)
BUN: 29 mg/dL — ABNORMAL HIGH (ref 6–20)
CO2: 19 mmol/L — ABNORMAL LOW (ref 22–32)
Calcium: 10.1 mg/dL (ref 8.9–10.3)
Chloride: 99 mmol/L (ref 98–111)
Creatinine, Ser: 1.28 mg/dL — ABNORMAL HIGH (ref 0.44–1.00)
GFR, Estimated: 56 mL/min — ABNORMAL LOW (ref >60)
Glucose, Bld: 115 mg/dL — ABNORMAL HIGH (ref 70–99)
Potassium: 3.4 mmol/L — ABNORMAL LOW (ref 3.5–5.1)
Sodium: 136 mmol/L (ref 135–145)
Total Bilirubin: 1.2 mg/dL (ref 0.3–1.2)
Total Protein: 9.4 g/dL — ABNORMAL HIGH (ref 6.5–8.1)

## 2021-10-20 LAB — BASIC METABOLIC PANEL
Anion gap: 10 (ref 5–15)
BUN: 25 mg/dL — ABNORMAL HIGH (ref 6–20)
CO2: 21 mmol/L — ABNORMAL LOW (ref 22–32)
Calcium: 8.4 mg/dL — ABNORMAL LOW (ref 8.9–10.3)
Chloride: 108 mmol/L (ref 98–111)
Creatinine, Ser: 1.12 mg/dL — ABNORMAL HIGH (ref 0.44–1.00)
GFR, Estimated: >60 mL/min (ref >60)
Glucose, Bld: 83 mg/dL (ref 70–99)
Potassium: 3.4 mmol/L — ABNORMAL LOW (ref 3.5–5.1)
Sodium: 139 mmol/L (ref 135–145)

## 2021-10-20 LAB — LIPASE, BLOOD: Lipase: 26 U/L (ref 11–51)

## 2021-10-20 MED ORDER — DOXYLAMINE-PYRIDOXINE 10-10 MG PO TBEC: 1.0000 | DELAYED_RELEASE_TABLET | Freq: Three times a day (TID) | ORAL | 0 refills | Status: DC | PRN

## 2021-10-20 MED ORDER — SODIUM CHLORIDE 0.9 % IV BOLUS
1000.0000 mL | Freq: Once | INTRAVENOUS | Status: AC
  Administered 2021-10-20: 1000 mL via INTRAVENOUS

## 2021-10-20 NOTE — Discharge Instructions (Signed)
You have been seen in the Emergency Department (ED) today for nausea and vomiting.  Your work up today has not shown a clear cause for your symptoms. ?You have been prescribed Diclegis; please use as prescribed as needed for your nausea. ? ?Follow up with your doctor as soon as possible regarding today?s emergent visit and your symptoms of nausea.  ? ?Return to the ED if you develop abdominal, bloody vomiting, bloody diarrhea, if you are unable to tolerate fluids due to vomiting, or if you develop other symptoms that concern you. ? ?

## 2022-01-30 ENCOUNTER — Encounter (HOSPITAL_BASED_OUTPATIENT_CLINIC_OR_DEPARTMENT_OTHER): Payer: Self-pay | Admitting: Emergency Medicine

## 2022-01-30 ENCOUNTER — Other Ambulatory Visit: Payer: Self-pay

## 2022-01-30 ENCOUNTER — Emergency Department (HOSPITAL_BASED_OUTPATIENT_CLINIC_OR_DEPARTMENT_OTHER)
Admission: EM | Admit: 2022-01-30 | Discharge: 2022-01-30 | Disposition: A | Discharge status: Home / Self Care | Payer: Medicaid Other | Attending: Emergency Medicine | Admitting: Emergency Medicine

## 2022-01-30 DIAGNOSIS — R112 Nausea with vomiting, unspecified: Secondary | ICD-10-CM | POA: Diagnosis present

## 2022-01-30 DIAGNOSIS — Z91010 Allergy to peanuts: Secondary | ICD-10-CM | POA: Diagnosis not present

## 2022-01-30 LAB — URINALYSIS, ROUTINE W REFLEX MICROSCOPIC
Bilirubin Urine: NEGATIVE
Glucose, UA: NEGATIVE mg/dL
Ketones, ur: 15 mg/dL — AB
Leukocytes,Ua: NEGATIVE
Nitrite: NEGATIVE
Protein, ur: NEGATIVE mg/dL
Specific Gravity, Urine: 1.025 (ref 1.005–1.030)
pH: 7 (ref 5.0–8.0)

## 2022-01-30 LAB — CBC WITH DIFFERENTIAL/PLATELET
Abs Immature Granulocytes: 0.07 10*3/uL (ref 0.00–0.07)
Basophils Absolute: 0 10*3/uL (ref 0.0–0.1)
Basophils Relative: 0 %
Eosinophils Absolute: 0.1 10*3/uL (ref 0.0–0.5)
Eosinophils Relative: 1 %
HCT: 39.9 % (ref 36.0–46.0)
Hemoglobin: 13.4 g/dL (ref 12.0–15.0)
Immature Granulocytes: 1 %
Lymphocytes Relative: 20 %
Lymphs Abs: 1.6 10*3/uL (ref 0.7–4.0)
MCH: 31.7 pg (ref 26.0–34.0)
MCHC: 33.6 g/dL (ref 30.0–36.0)
MCV: 94.3 fL (ref 80.0–100.0)
Monocytes Absolute: 0.5 10*3/uL (ref 0.1–1.0)
Monocytes Relative: 7 %
Neutro Abs: 5.5 10*3/uL (ref 1.7–7.7)
Neutrophils Relative %: 71 %
Platelets: 221 10*3/uL (ref 150–400)
RBC: 4.23 MIL/uL (ref 3.87–5.11)
RDW: 11.9 % (ref 11.5–15.5)
WBC: 7.8 10*3/uL (ref 4.0–10.5)
nRBC: 0 % (ref 0.0–0.2)

## 2022-01-30 LAB — BASIC METABOLIC PANEL
Anion gap: 6 (ref 5–15)
BUN: 8 mg/dL (ref 6–20)
CO2: 23 mmol/L (ref 22–32)
Calcium: 8.9 mg/dL (ref 8.9–10.3)
Chloride: 108 mmol/L (ref 98–111)
Creatinine, Ser: 0.68 mg/dL (ref 0.44–1.00)
GFR, Estimated: >60 mL/min (ref >60)
Glucose, Bld: 90 mg/dL (ref 70–99)
Potassium: 4 mmol/L (ref 3.5–5.1)
Sodium: 137 mmol/L (ref 135–145)

## 2022-01-30 LAB — URINALYSIS, MICROSCOPIC (REFLEX)

## 2022-01-30 LAB — PREGNANCY, URINE: Preg Test, Ur: NEGATIVE

## 2022-01-30 MED ORDER — SODIUM CHLORIDE 0.9 % IV SOLN
12.5000 mg | Freq: Four times a day (QID) | INTRAVENOUS | Status: DC | PRN
  Administered 2022-01-30: 12.5 mg via INTRAVENOUS
  Filled 2022-01-30: qty 0.5

## 2022-01-30 MED ORDER — PROMETHAZINE HCL 25 MG PO TABS: 25.0000 mg | ORAL_TABLET | Freq: Three times a day (TID) | ORAL | 0 refills | Status: DC | PRN

## 2022-01-30 MED ORDER — SODIUM CHLORIDE 0.9 % IV BOLUS
500.0000 mL | Freq: Once | INTRAVENOUS | Status: AC
  Administered 2022-01-30: 500 mL via INTRAVENOUS

## 2022-01-30 MED ORDER — PROMETHAZINE HCL 25 MG/ML IJ SOLN
INTRAMUSCULAR | Status: AC
  Filled 2022-01-30: qty 1

## 2022-01-30 MED ORDER — ALUM & MAG HYDROXIDE-SIMETH 200-200-20 MG/5ML PO SUSP
30.0000 mL | Freq: Once | ORAL | Status: AC
  Administered 2022-01-30: 30 mL via ORAL
  Filled 2022-01-30: qty 30

## 2022-01-30 MED ORDER — HYOSCYAMINE SULFATE 0.125 MG SL SUBL
0.1250 mg | SUBLINGUAL_TABLET | Freq: Once | SUBLINGUAL | Status: AC
  Administered 2022-01-30: 0.125 mg via SUBLINGUAL
  Filled 2022-01-30: qty 1

## 2022-01-30 NOTE — ED Triage Notes (Signed)
Vomiting "on and off" since Thursday. Reports LLQ abd pain that "feels like its pulling". Hx of hysterectomy. Also concerned for thrush. No active vomiting at triage.

## 2022-01-30 NOTE — ED Notes (Signed)
Attempted PIV X 2 without success.

## 2022-01-30 NOTE — ED Provider Notes (Addendum)
[facility] EMERGENCY DEPARTMENT Provider Note   CSN: [id number] Arrival date & time: 01/30/22  0130     History  Chief Complaint  Patient presents with   Emesis    Sherry Key is a 36 y.o. female.  The history is provided by the patient.  Emesis Severity:  Moderate Duration:  5 days Timing:  Sporadic Quality:  Stomach contents Progression:  Unchanged Chronicity:  Chronic Recent urination:  Normal Context: not post-tussive   Relieved by:  Nothing Worsened by:  Nothing Ineffective treatments:  None tried Associated symptoms: no fever   Associated symptoms comment:  Cramping  Risk factors: no alcohol use   Risk factors comment:  Marijuana use      Home Medications Prior to Admission medications   Medication Sig Start Date End Date Taking? Authorizing Provider  Doxylamine-Pyridoxine 10-10 MG TBEC Take 1 tablet by mouth every 8 (eight) hours as needed. 10/20/21   Long, Wonda Olds, MD  flintstones complete (FLINTSTONES) 60 MG chewable tablet Chew 1 tablet by mouth daily.    [provider]  promethazine (PHENERGAN) 25 MG tablet Take 1 tablet (25 mg total) by mouth every 8 (eight) hours as needed for nausea or vomiting. 01/30/22   Vernee Baines, MD      Allergies    Dicyclomine, Morphine, Percocet [oxycodone-acetaminophen], Zofran [ondansetron hcl], Ketorolac, Other, Peanut-containing drug products, Percocet [oxycodone-acetaminophen], Tomato, Zofran, and Haloperidol    Review of Systems   Review of Systems  Constitutional:  Negative for fever.  HENT:  Negative for facial swelling.   Eyes:  Negative for redness.  Respiratory:  Negative for wheezing and stridor.   Gastrointestinal:  Positive for nausea and vomiting.  All other systems reviewed and are negative.   Physical Exam Updated Vital Signs BP (!) 162/112 (BP Location: Right Arm)   Pulse 77   Temp 98.4 F (36.9 C)   Resp 19   Ht 5' 3"  (1.6 m)   Wt 70.3 kg   LMP 10/19/2019    SpO2 100%   BMI 27.46 kg/m  Physical Exam Vitals and nursing note reviewed.  Constitutional:      General: She is not in acute distress.    Appearance: She is well-developed.  HENT:     Head: Normocephalic and atraumatic.  Eyes:     Pupils: Pupils are equal, round, and reactive to light.  Cardiovascular:     Rate and Rhythm: Normal rate and regular rhythm.     Pulses: Normal pulses.     Heart sounds: Normal heart sounds.  Pulmonary:     Effort: Pulmonary effort is normal. No respiratory distress.     Breath sounds: Normal breath sounds.  Abdominal:     General: Bowel sounds are normal. There is no distension.     Palpations: Abdomen is soft.     Tenderness: There is no abdominal tenderness. There is no guarding or rebound.  Genitourinary:    Vagina: No vaginal discharge.  Musculoskeletal:        General: Normal range of motion.     Cervical back: Neck supple.  Skin:    General: Skin is dry.     Capillary Refill: Capillary refill takes less than 2 seconds.     Findings: No erythema or rash.  Neurological:     General: No focal deficit present.     Deep Tendon Reflexes: Reflexes normal.  Psychiatric:        Mood and Affect: Mood normal.  ED Results / Procedures / Treatments   Labs (all labs ordered are listed, but only abnormal results are displayed) Labs Reviewed  URINALYSIS, ROUTINE W REFLEX MICROSCOPIC - Abnormal; Notable for the following components:      Result Value   APPearance HAZY (*)    Hgb urine dipstick SMALL (*)    Ketones, ur 15 (*)    All other components within normal limits  URINALYSIS, MICROSCOPIC (REFLEX) - Abnormal; Notable for the following components:   Bacteria, UA FEW (*)    All other components within normal limits  PREGNANCY, URINE  CBC WITH DIFFERENTIAL/PLATELET  BASIC METABOLIC PANEL    EKG None  Radiology No results found.  Procedures Procedures    Medications Ordered in ED Medications  promethazine (PHENERGAN) 12.5  mg in sodium chloride 0.9 % 50 mL IVPB (has no administration in time range)  sodium chloride 0.9 % bolus 500 mL (has no administration in time range)  promethazine (PHENERGAN) 25 MG/ML injection (has no administration in time range)  hyoscyamine (LEVSIN SL) SL tablet 0.125 mg (has no administration in time range)  alum & mag hydroxide-simeth (MAALOX/MYLANTA) 200-200-20 MG/5ML suspension 30 mL (has no administration in time range)    ED Course/ Medical Decision Making/ A&P                           Medical Decision Making Nausea and vomiting and cramping.    Amount and/or Complexity of Data Reviewed External Data Reviewed: notes.    Details: Previous ED visits reviewed Labs: ordered.    Details: pregnancy is negative, urine is not consistent with UTI. normal white count and hemoglobin.  Normal metabolic panel.  No signs of excessive losses from vomiting.  Normal potassium  Risk OTC drugs. Prescription drug management. Risk Details: Medication is ordered.  Patient had not vomited in the ED while waiting for medication.  No tachycardia and no signs of dehydration.  Normal white count, no signs of systemic infection.   Exam is benign and reassuring.  Patient was able to hold down PO medication prior to being given antiemetics without difficulty.  I do not believe patient needs imaging at this time.  I will not be prescribing narcotics for this issue.  Patient is stable for discharge with close follow up.  RX for phenergan.      Final Clinical Impression(s) / ED Diagnoses Final diagnoses:  Nausea and vomiting, unspecified vomiting type   Return for intractable cough, coughing up blood, fevers > 100.4 unrelieved by medication, shortness of breath, intractable vomiting, chest pain, shortness of breath, weakness, numbness, changes in speech, facial asymmetry, abdominal pain, passing out, Inability to tolerate liquids or food, cough, altered mental status or any concerns. No signs of systemic  illness or infection. The patient is nontoxic-appearing on exam and vital signs are within normal limits.  I have reviewed the triage vital signs and the nursing notes. Pertinent labs & imaging results that were available during my care of the patient were reviewed by me and considered in my medical decision making (see chart for details). After history, exam, and medical workup I feel the patient has been appropriately medically screened and is safe for discharge home. Pertinent diagnoses were discussed with the patient. Patient was given return precautions.  Rx / DC Orders ED Discharge Orders          Ordered    promethazine (PHENERGAN) 25 MG tablet  Every 8 hours PRN  01/30/22 Satartia, Jermell Holeman, MD 01/30/22 [id number]

## 2022-02-23 ENCOUNTER — Emergency Department (HOSPITAL_COMMUNITY)
Admission: EM | Admit: 2022-02-23 | Discharge: 2022-02-23 | Disposition: A | Discharge status: Home / Self Care | Payer: Medicaid Other | Attending: Emergency Medicine | Admitting: Emergency Medicine

## 2022-02-23 ENCOUNTER — Emergency Department (HOSPITAL_COMMUNITY): Payer: Medicaid Other

## 2022-02-23 ENCOUNTER — Encounter (HOSPITAL_COMMUNITY): Payer: Self-pay | Admitting: Emergency Medicine

## 2022-02-23 DIAGNOSIS — R079 Chest pain, unspecified: Secondary | ICD-10-CM | POA: Insufficient documentation

## 2022-02-23 DIAGNOSIS — R1110 Vomiting, unspecified: Secondary | ICD-10-CM

## 2022-02-23 DIAGNOSIS — K5900 Constipation, unspecified: Secondary | ICD-10-CM | POA: Insufficient documentation

## 2022-02-23 DIAGNOSIS — Z91010 Allergy to peanuts: Secondary | ICD-10-CM | POA: Diagnosis not present

## 2022-02-23 DIAGNOSIS — I10 Essential (primary) hypertension: Secondary | ICD-10-CM | POA: Diagnosis not present

## 2022-02-23 DIAGNOSIS — R109 Unspecified abdominal pain: Secondary | ICD-10-CM | POA: Diagnosis not present

## 2022-02-23 DIAGNOSIS — R112 Nausea with vomiting, unspecified: Secondary | ICD-10-CM | POA: Insufficient documentation

## 2022-02-23 LAB — URINALYSIS, ROUTINE W REFLEX MICROSCOPIC
Bilirubin Urine: NEGATIVE
Glucose, UA: NEGATIVE mg/dL
Ketones, ur: 20 mg/dL — AB
Leukocytes,Ua: NEGATIVE
Nitrite: NEGATIVE
Protein, ur: 30 mg/dL — AB
Specific Gravity, Urine: >1.046 — ABNORMAL HIGH (ref 1.005–1.030)
pH: 6 (ref 5.0–8.0)

## 2022-02-23 LAB — CBC
HCT: 47.3 % — ABNORMAL HIGH (ref 36.0–46.0)
Hemoglobin: 16.1 g/dL — ABNORMAL HIGH (ref 12.0–15.0)
MCH: 31.8 pg (ref 26.0–34.0)
MCHC: 34 g/dL (ref 30.0–36.0)
MCV: 93.5 fL (ref 80.0–100.0)
Platelets: 299 10*3/uL (ref 150–400)
RBC: 5.06 MIL/uL (ref 3.87–5.11)
RDW: 12.4 % (ref 11.5–15.5)
WBC: 9.8 10*3/uL (ref 4.0–10.5)
nRBC: 0 % (ref 0.0–0.2)

## 2022-02-23 LAB — COMPREHENSIVE METABOLIC PANEL
ALT: 15 U/L (ref 0–44)
AST: 18 U/L (ref 15–41)
Albumin: 4.6 g/dL (ref 3.5–5.0)
Alkaline Phosphatase: 76 U/L (ref 38–126)
Anion gap: 14 (ref 5–15)
BUN: 42 mg/dL — ABNORMAL HIGH (ref 6–20)
CO2: 20 mmol/L — ABNORMAL LOW (ref 22–32)
Calcium: 9.6 mg/dL (ref 8.9–10.3)
Chloride: 102 mmol/L (ref 98–111)
Creatinine, Ser: 1.44 mg/dL — ABNORMAL HIGH (ref 0.44–1.00)
GFR, Estimated: 48 mL/min — ABNORMAL LOW (ref >60)
Glucose, Bld: 112 mg/dL — ABNORMAL HIGH (ref 70–99)
Potassium: 3.3 mmol/L — ABNORMAL LOW (ref 3.5–5.1)
Sodium: 136 mmol/L (ref 135–145)
Total Bilirubin: 1.5 mg/dL — ABNORMAL HIGH (ref 0.3–1.2)
Total Protein: 8.9 g/dL — ABNORMAL HIGH (ref 6.5–8.1)

## 2022-02-23 LAB — LIPASE, BLOOD: Lipase: 25 U/L (ref 11–51)

## 2022-02-23 MED ORDER — SODIUM CHLORIDE (PF) 0.9 % IJ SOLN
INTRAMUSCULAR | Status: AC
  Filled 2022-02-23: qty 50

## 2022-02-23 MED ORDER — IOHEXOL 300 MG/ML  SOLN
100.0000 mL | Freq: Once | INTRAMUSCULAR | Status: AC | PRN
  Administered 2022-02-23: 100 mL via INTRAVENOUS

## 2022-02-23 MED ORDER — SODIUM CHLORIDE 0.9 % IV BOLUS
1000.0000 mL | Freq: Once | INTRAVENOUS | Status: AC
  Administered 2022-02-23: 1000 mL via INTRAVENOUS

## 2022-02-23 MED ORDER — HYDROMORPHONE HCL 2 MG/ML IJ SOLN
0.5000 mg | Freq: Once | INTRAMUSCULAR | Status: AC
  Administered 2022-02-23: 0.5 mg via INTRAVENOUS
  Filled 2022-02-23: qty 1

## 2022-02-23 MED ORDER — SODIUM CHLORIDE 0.9 % IV SOLN
12.5000 mg | Freq: Four times a day (QID) | INTRAVENOUS | Status: DC | PRN
  Administered 2022-02-23: 12.5 mg via INTRAVENOUS
  Filled 2022-02-23: qty 12.5

## 2022-02-23 MED ORDER — FENTANYL CITRATE PF 50 MCG/ML IJ SOSY
50.0000 ug | PREFILLED_SYRINGE | Freq: Once | INTRAMUSCULAR | Status: AC
  Administered 2022-02-23: 50 ug via INTRAVENOUS
  Filled 2022-02-23: qty 1

## 2022-02-23 MED ORDER — PROMETHAZINE HCL 25 MG PO TABS: 25.0000 mg | ORAL_TABLET | Freq: Four times a day (QID) | ORAL | 0 refills | Status: DC | PRN

## 2022-02-23 NOTE — ED Notes (Signed)
Pt prompted to provide urine sample. Pt states not being able to at this time.

## 2022-02-23 NOTE — ED Notes (Signed)
Pt back from CT

## 2022-02-23 NOTE — Discharge Instructions (Addendum)
You were seen in the ER for abdominal pain and vomiting.  As we discussed, your lab work showed you were dehydrated. Your CT scan did not show any concerning findings. I have refilled your nausea medicine and sent a referral to the GI doctor.   Continue to monitor how you're doing and return to the ER for new or worsening symptoms.

## 2022-02-23 NOTE — ED Notes (Signed)
Pt c/o mouth dryness.requesting something to drink. Pt provided with oral rinse and swabs to help with dryness.

## 2022-02-23 NOTE — ED Notes (Signed)
Pt aware urine sample needed 

## 2022-02-23 NOTE — ED Notes (Signed)
Patient aware urine sample is needed.  

## 2022-02-23 NOTE — ED Triage Notes (Addendum)
Patient here from home reporting n/v/d x4 days. Reports hx of cyclical vomiting Allergy to Zofran.

## 2022-02-23 NOTE — ED Provider Notes (Signed)
[facility] DEPT Provider Note   CSN: [id number] Arrival date & time: 02/23/22  0749     History  Chief Complaint  Patient presents with   Abdominal Pain   Nausea   Emesis    Sherry Key is a 36 y.o. female with a history of cyclical vomiting syndrome, endometriosis, fibroids, hypertension, and PCOS who presents the emergency department complaining of left side pain, nausea and vomiting for 4 days.  Patient states that she recently moved into an apartment, but does not remember lifting anything heavy.  Her left side from her chest to her abdomen started bothering her, and then she began vomiting.  She states that this feels different than her previous cyclical episodes due to the pain.  She has not had a bowel movement in 4 days, and does not believe she is passing gas any longer.  Denies any fevers or chills.  Has vomited many times to count, denies seeing blood.   Abdominal Pain Associated symptoms: chest pain, constipation, nausea and vomiting   Associated symptoms: no chills, no fever and no shortness of breath   Emesis Associated symptoms: abdominal pain   Associated symptoms: no chills and no fever        Home Medications Prior to Admission medications   Medication Sig Start Date End Date Taking? Authorizing Provider  promethazine (PHENERGAN) 25 MG tablet Take 1 tablet (25 mg total) by mouth every 6 (six) hours as needed for nausea or vomiting. 02/23/22  Yes Elmin Wiederholt T, PA-C  Doxylamine-Pyridoxine 10-10 MG TBEC Take 1 tablet by mouth every 8 (eight) hours as needed. 10/20/21   Long, Wonda Olds, MD  flintstones complete (FLINTSTONES) 60 MG chewable tablet Chew 1 tablet by mouth daily.    [provider]      Allergies    Dicyclomine, Morphine, Percocet [oxycodone-acetaminophen], Zofran [ondansetron hcl], Ketorolac, Other, Peanut-containing drug products, Percocet [oxycodone-acetaminophen], Tomato, Zofran, and Haloperidol     Review of Systems   Review of Systems  Constitutional:  Negative for chills and fever.  Respiratory:  Negative for shortness of breath.   Cardiovascular:  Positive for chest pain.  Gastrointestinal:  Positive for abdominal pain, constipation, nausea and vomiting.  Genitourinary:  Positive for decreased urine volume.  All other systems reviewed and are negative.   Physical Exam Updated Vital Signs BP (!) 157/102   Pulse 85   Temp 98.2 F (36.8 C) (Oral)   Resp (!) 21   LMP 10/19/2019   SpO2 98%  Physical Exam Vitals and nursing note reviewed.  Constitutional:      Appearance: Normal appearance.     Comments: Patient doubled over in exam bed in pain  HENT:     Head: Normocephalic and atraumatic.  Eyes:     Conjunctiva/sclera: Conjunctivae normal.  Cardiovascular:     Rate and Rhythm: Normal rate and regular rhythm.  Pulmonary:     Effort: Pulmonary effort is normal. No respiratory distress.     Breath sounds: Normal breath sounds.  Abdominal:     General: There is no distension.     Palpations: Abdomen is soft.     Tenderness: There is abdominal tenderness in the left upper quadrant and left lower quadrant. There is guarding.  Skin:    General: Skin is warm and dry.  Neurological:     General: No focal deficit present.     Mental Status: She is alert.     ED Results / Procedures / Treatments  Labs (all labs ordered are listed, but only abnormal results are displayed) Labs Reviewed  COMPREHENSIVE METABOLIC PANEL - Abnormal; Notable for the following components:      Result Value   Potassium 3.3 (*)    CO2 20 (*)    Glucose, Bld 112 (*)    BUN 42 (*)    Creatinine, Ser 1.44 (*)    Total Protein 8.9 (*)    Total Bilirubin 1.5 (*)    GFR, Estimated 48 (*)    All other components within normal limits  CBC - Abnormal; Notable for the following components:   Hemoglobin 16.1 (*)    HCT 47.3 (*)    All other components within normal limits  LIPASE, BLOOD   URINALYSIS, ROUTINE W REFLEX MICROSCOPIC    EKG EKG Interpretation  Date/Time:  Thursday February 23 2022 09:28:19 EDT Ventricular Rate:  83 PR Interval:  160 QRS Duration: 86 QT Interval:  384 QTC Calculation: 452 R Axis:   73 Text Interpretation: Sinus rhythm Consider left atrial enlargement Left ventricular hypertrophy Confirmed by Dene Gentry [phone]) on 02/23/2022 9:32:41 AM  Radiology CT ABDOMEN PELVIS W CONTRAST  Result Date: 02/23/2022 CLINICAL DATA:  Left lower quadrant abdominal pain. EXAM: CT ABDOMEN AND PELVIS WITH CONTRAST TECHNIQUE: Multidetector CT imaging of the abdomen and pelvis was performed using the standard protocol following bolus administration of intravenous contrast. RADIATION DOSE REDUCTION: This exam was performed according to the departmental dose-optimization program which includes automated exposure control, adjustment of the mA and/or kV according to patient size and/or use of iterative reconstruction technique. CONTRAST:  141m OMNIPAQUE IOHEXOL 300 MG/ML  SOLN COMPARISON:  April 10, 2021.  January 20, 2020. FINDINGS: Lower chest: No acute abnormality. Hepatobiliary: No focal liver abnormality is seen. Status post cholecystectomy. No biliary dilatation. Pancreas: Grossly stable 9 mm low-density lesion is seen in pancreatic tail which can be considered benign at this point. No other pancreatic abnormality is noted. Spleen: Normal in size without focal abnormality. Adrenals/Urinary Tract: Adrenal glands are unremarkable. Kidneys are normal, without renal calculi, focal lesion, or hydronephrosis. Bladder is unremarkable. Stomach/Bowel: Stomach is within normal limits. Appendix appears normal. No evidence of bowel wall thickening, distention, or inflammatory changes. Vascular/Lymphatic: No significant vascular findings are present. No enlarged abdominal or pelvic lymph nodes. Reproductive: Status post hysterectomy. No adnexal masses. Other: No abdominal wall hernia or  abnormality. No abdominopelvic ascites. Musculoskeletal: No acute or significant osseous findings. IMPRESSION: No acute abnormality seen in the abdomen or pelvis. Electronically Signed   By: JMarijo ConceptionM.D.   On: 02/23/2022 09:35    Procedures Procedures    Medications Ordered in ED Medications  promethazine (PHENERGAN) 12.5 mg in sodium chloride 0.9 % 50 mL IVPB (12.5 mg Intravenous New Bag/Given 02/23/22 0951)  sodium chloride (PF) 0.9 % injection (has no administration in time range)  sodium chloride 0.9 % bolus 1,000 mL (1,000 mLs Intravenous New Bag/Given 02/23/22 0902)  fentaNYL (SUBLIMAZE) injection 50 mcg (50 mcg Intravenous Given 02/23/22 0857)  iohexol (OMNIPAQUE) 300 MG/ML solution 100 mL (100 mLs Intravenous Contrast Given 02/23/22 0913)  sodium chloride 0.9 % bolus 1,000 mL (1,000 mLs Intravenous New Bag/Given 02/23/22 0951)  HYDROmorphone (DILAUDID) injection 0.5 mg (0.5 mg Intravenous Given 02/23/22 1002)    ED Course/ Medical Decision Making/ A&P                           Medical Decision Making Amount and/or Complexity of  Data Reviewed Labs: ordered. Radiology: ordered.  Risk Prescription drug management.  This patient is a 36 y.o. female  who presents to the ED for concern of left side pain and vomiting for 4 days.   Differential diagnoses prior to evaluation: The emergent differential diagnosis includes, but is not limited to,  AAA, mesenteric ischemia, appendicitis, diverticulitis, DKA, gastritis/gastroenteritis, nephrolithiasis, pancreatitis, constipation, UTI, bowel obstruction, biliary disease, IBD, PUD, hepatitis, ectopic pregnancy, ovarian torsion, PID. This is not an exhaustive differential.   Past Medical History / Co-morbidities: Hypertension, cyclical vomiting, PCOS, Crohn's disease, fibroids, endometriosis, cannabinoid hyperemesis  Additional history: Chart reviewed. Pertinent results include: Patient was recently seen for similar symptoms on 7/24 and  was given Phenergan and GI cocktail.  Reassuring laboratory evaluation and was discharged in good condition.    Physical Exam: Physical exam performed. The pertinent findings include: Patient doubled over in exam bed and pain.  Normal vital signs.  Abdominal pain, with tenderness in left upper and lower quadrants with some guarding.  Lab Tests/Imaging studies: I personally interpreted labs/imaging and the pertinent results include: No leukocytosis, hemoglobin 16.1 (likely hemoconcentrated).  Potassium 3.3, glucose 112, BUN 42, creatinine 1.44, GFR 48.  Normal liver function.  Lipase normal. Urinalysis pending.   CT abdomen/pelvis without acute intraabdominal findings. I reviewed the images and I agree with the radiologist interpretation.  Cardiac monitoring: EKG obtained and interpreted by my attending physician which shows: sinus rhythm   Medications: I ordered medication including pain medication and phenergan.  I have reviewed the patients home medicines and have made adjustments as needed.   Disposition: After consideration of the diagnostic results and the patients response to treatment, I feel that emergency department workup does not suggest an emergent condition requiring admission or immediate intervention beyond what has been performed at this time. The plan is: discharge to home with antiemetics and GI referral for hyperemesis. The patient is safe for discharge and has been instructed to return immediately for worsening symptoms, change in symptoms or any other concerns.   Final Clinical Impression(s) / ED Diagnoses Final diagnoses:  Hyperemesis  Left sided abdominal pain    Rx / DC Orders ED Discharge Orders          Ordered    promethazine (PHENERGAN) 25 MG tablet  Every 6 hours PRN        02/23/22 1120    Ambulatory referral to Gastroenterology        02/23/22 1120           Portions of this report may have been transcribed using voice recognition software. Every  effort was made to ensure accuracy; however, inadvertent computerized transcription errors may be present.    Estill Cotta 02/23/22 1132    Valarie Merino, MD 02/24/22 [phone]

## 2022-07-02 ENCOUNTER — Emergency Department (HOSPITAL_COMMUNITY)
Admission: EM | Admit: 2022-07-02 | Discharge: 2022-07-02 | Disposition: A | Discharge status: Home / Self Care | Payer: Medicaid Other | Attending: Emergency Medicine | Admitting: Emergency Medicine

## 2022-07-02 ENCOUNTER — Emergency Department (HOSPITAL_COMMUNITY): Payer: Medicaid Other

## 2022-07-02 ENCOUNTER — Other Ambulatory Visit: Payer: Self-pay

## 2022-07-02 DIAGNOSIS — R1084 Generalized abdominal pain: Secondary | ICD-10-CM | POA: Diagnosis not present

## 2022-07-02 DIAGNOSIS — R112 Nausea with vomiting, unspecified: Secondary | ICD-10-CM | POA: Insufficient documentation

## 2022-07-02 DIAGNOSIS — Z1152 Encounter for screening for COVID-19: Secondary | ICD-10-CM | POA: Diagnosis not present

## 2022-07-02 DIAGNOSIS — Z91010 Allergy to peanuts: Secondary | ICD-10-CM | POA: Diagnosis not present

## 2022-07-02 DIAGNOSIS — D72829 Elevated white blood cell count, unspecified: Secondary | ICD-10-CM | POA: Insufficient documentation

## 2022-07-02 LAB — URINALYSIS, ROUTINE W REFLEX MICROSCOPIC
Bacteria, UA: NONE SEEN
Bilirubin Urine: NEGATIVE
Glucose, UA: NEGATIVE mg/dL
Ketones, ur: 20 mg/dL — AB
Leukocytes,Ua: NEGATIVE
Nitrite: NEGATIVE
Protein, ur: 30 mg/dL — AB
Specific Gravity, Urine: 1.01 (ref 1.005–1.030)
pH: 6 (ref 5.0–8.0)

## 2022-07-02 LAB — CBC WITH DIFFERENTIAL/PLATELET
Abs Immature Granulocytes: 0.06 10*3/uL (ref 0.00–0.07)
Basophils Absolute: 0.1 10*3/uL (ref 0.0–0.1)
Basophils Relative: 0 %
Eosinophils Absolute: 0.1 10*3/uL (ref 0.0–0.5)
Eosinophils Relative: 0 %
HCT: 45.5 % (ref 36.0–46.0)
Hemoglobin: 15.2 g/dL — ABNORMAL HIGH (ref 12.0–15.0)
Immature Granulocytes: 0 %
Lymphocytes Relative: 11 %
Lymphs Abs: 1.6 10*3/uL (ref 0.7–4.0)
MCH: 31.9 pg (ref 26.0–34.0)
MCHC: 33.4 g/dL (ref 30.0–36.0)
MCV: 95.4 fL (ref 80.0–100.0)
Monocytes Absolute: 1.1 10*3/uL — ABNORMAL HIGH (ref 0.1–1.0)
Monocytes Relative: 7 %
Neutro Abs: 12.2 10*3/uL — ABNORMAL HIGH (ref 1.7–7.7)
Neutrophils Relative %: 82 %
Platelets: 313 10*3/uL (ref 150–400)
RBC: 4.77 MIL/uL (ref 3.87–5.11)
RDW: 12.6 % (ref 11.5–15.5)
WBC: 15 10*3/uL — ABNORMAL HIGH (ref 4.0–10.5)
nRBC: 0 % (ref 0.0–0.2)

## 2022-07-02 LAB — RESP PANEL BY RT-PCR (RSV, FLU A&B, COVID)  RVPGX2
Influenza A by PCR: NEGATIVE
Influenza B by PCR: NEGATIVE
Resp Syncytial Virus by PCR: NEGATIVE
SARS Coronavirus 2 by RT PCR: NEGATIVE

## 2022-07-02 LAB — COMPREHENSIVE METABOLIC PANEL
ALT: 14 U/L (ref 0–44)
AST: 19 U/L (ref 15–41)
Albumin: 4.8 g/dL (ref 3.5–5.0)
Alkaline Phosphatase: 73 U/L (ref 38–126)
Anion gap: 11 (ref 5–15)
BUN: 20 mg/dL (ref 6–20)
CO2: 22 mmol/L (ref 22–32)
Calcium: 9.7 mg/dL (ref 8.9–10.3)
Chloride: 103 mmol/L (ref 98–111)
Creatinine, Ser: 0.94 mg/dL (ref 0.44–1.00)
GFR, Estimated: >60 mL/min (ref >60)
Glucose, Bld: 93 mg/dL (ref 70–99)
Potassium: 3.3 mmol/L — ABNORMAL LOW (ref 3.5–5.1)
Sodium: 136 mmol/L (ref 135–145)
Total Bilirubin: 1.3 mg/dL — ABNORMAL HIGH (ref 0.3–1.2)
Total Protein: 9.3 g/dL — ABNORMAL HIGH (ref 6.5–8.1)

## 2022-07-02 LAB — LIPASE, BLOOD: Lipase: 31 U/L (ref 11–51)

## 2022-07-02 LAB — I-STAT BETA HCG BLOOD, ED (MC, WL, AP ONLY): I-stat hCG, quantitative: <5 m[IU]/mL (ref <5)

## 2022-07-02 MED ORDER — METOCLOPRAMIDE HCL 10 MG PO TABS: 10.0000 mg | ORAL_TABLET | Freq: Four times a day (QID) | ORAL | 0 refills | Status: DC

## 2022-07-02 MED ORDER — MORPHINE SULFATE (PF) 4 MG/ML IV SOLN
4.0000 mg | Freq: Once | INTRAVENOUS | Status: AC
  Administered 2022-07-02: 4 mg via INTRAVENOUS
  Filled 2022-07-02: qty 1

## 2022-07-02 MED ORDER — PROMETHAZINE HCL 25 MG PO TABS: 25.0000 mg | ORAL_TABLET | Freq: Four times a day (QID) | ORAL | 0 refills | Status: DC | PRN

## 2022-07-02 MED ORDER — LORAZEPAM 2 MG/ML IJ SOLN
1.0000 mg | Freq: Once | INTRAMUSCULAR | Status: AC
Start: 2022-07-02 — End: 2022-07-02
  Administered 2022-07-02: 1 mg via INTRAVENOUS
  Filled 2022-07-02: qty 1

## 2022-07-02 MED ORDER — DIPHENHYDRAMINE HCL 50 MG/ML IJ SOLN
12.5000 mg | Freq: Once | INTRAMUSCULAR | Status: AC
Start: 2022-07-02 — End: 2022-07-02
  Administered 2022-07-02: 12.5 mg via INTRAVENOUS
  Filled 2022-07-02: qty 1

## 2022-07-02 MED ORDER — HYDROMORPHONE HCL 1 MG/ML IJ SOLN
1.0000 mg | Freq: Once | INTRAMUSCULAR | Status: AC
  Administered 2022-07-02: 1 mg via INTRAVENOUS
  Filled 2022-07-02: qty 1

## 2022-07-02 MED ORDER — IOHEXOL 300 MG/ML  SOLN
100.0000 mL | Freq: Once | INTRAMUSCULAR | Status: AC | PRN
  Administered 2022-07-02: 100 mL via INTRAVENOUS

## 2022-07-02 MED ORDER — HYDROCODONE-ACETAMINOPHEN 5-325 MG PO TABS: 1.0000 | ORAL_TABLET | ORAL | 0 refills | Status: DC | PRN

## 2022-07-02 MED ORDER — HYDROXYZINE HCL 25 MG PO TABS: 25.0000 mg | ORAL_TABLET | Freq: Four times a day (QID) | ORAL | 0 refills | Status: DC

## 2022-07-02 MED ORDER — SODIUM CHLORIDE 0.9 % IV BOLUS
1000.0000 mL | Freq: Once | INTRAVENOUS | Status: AC
  Administered 2022-07-02: 1000 mL via INTRAVENOUS

## 2022-07-02 NOTE — ED Triage Notes (Signed)
Patient brought in from home by EMS with c/o N&V x2 days. She is allergic to Zofran but took phenergan at home with no relief. She denies vomiting blood but states she has not been able to have a bowel movement or urinate x2 days.

## 2022-07-02 NOTE — Discharge Instructions (Addendum)
You came to the emergency department today for abdominal pain, nausea and vomiting.  Your CAT scan is normal.  Your lab work is also reassuring.  I have put in referral to gastroenterology for you to follow-up with.  If you do not hear from them you may call either of the offices attached to these discharge papers.  I have sent hydrocodone to your pharmacy.  This should only be used for severe pain.  Do not drink alcohol on this medication or drive as it may make you drowsy.  Hydroxyzine is a medication similar to Benadryl that you may take for any itching if needed.  Metoclopramide and Reglan have also been refilled for nausea.  It was a pleasure to meet you and we hope you feel better!

## 2022-07-02 NOTE — ED Provider Notes (Signed)
[facility] DEPT Provider Note   CSN: [id number] Arrival date & time: 07/02/22  1059     History  Chief Complaint  Patient presents with   Nausea    N&V x2 days    Sherry Key is a 36 y.o. female with a past medical history of cyclical vomiting syndrome presenting today due to nausea and vomiting for the past 2 days.  She says that it started acutely while she was at work with some right-sided abdominal tenderness.  She has Phenergan at home which does not help her.  Denies changes in her diet, recent travel or antibiotic use.  No known sick contacts.  No urinary or vaginal symptoms  HPI     Home Medications Prior to Admission medications   Medication Sig Start Date End Date Taking? Authorizing Provider  Doxylamine-Pyridoxine 10-10 MG TBEC Take 1 tablet by mouth every 8 (eight) hours as needed. 10/20/21   Long, Wonda Olds, MD  flintstones complete (FLINTSTONES) 60 MG chewable tablet Chew 1 tablet by mouth daily.    [provider]  promethazine (PHENERGAN) 25 MG tablet Take 1 tablet (25 mg total) by mouth every 6 (six) hours as needed for nausea or vomiting. 02/23/22   Roemhildt, Lorin T, PA-C      Allergies    Dicyclomine, Morphine, Percocet [oxycodone-acetaminophen], Zofran [ondansetron hcl], Ketorolac, Other, Peanut-containing drug products, Percocet [oxycodone-acetaminophen], Tomato, Zofran, and Haloperidol    Review of Systems   Review of Systems  Physical Exam Updated Vital Signs BP (!) 134/94   Pulse 80   Temp 98.1 F (36.7 C) (Oral)   Resp 20   Ht 5' 3"  (1.6 m)   Wt 75 kg   LMP 10/19/2019   SpO2 100%   BMI 29.29 kg/m  Physical Exam Vitals and nursing note reviewed.  Constitutional:      Appearance: Normal appearance.  HENT:     Head: Normocephalic and atraumatic.  Eyes:     General: No scleral icterus.    Conjunctiva/sclera: Conjunctivae normal.  Pulmonary:     Effort: Pulmonary effort is normal. No  respiratory distress.  Abdominal:     General: Abdomen is flat.     Palpations: Abdomen is soft.     Tenderness: There is abdominal tenderness (Generalized). There is no right CVA tenderness or left CVA tenderness.  Skin:    Findings: No rash.  Neurological:     Mental Status: She is alert.  Psychiatric:        Mood and Affect: Mood normal.     ED Results / Procedures / Treatments   Labs (all labs ordered are listed, but only abnormal results are displayed) Labs Reviewed  COMPREHENSIVE METABOLIC PANEL - Abnormal; Notable for the following components:      Result Value   Potassium 3.3 (*)    Total Protein 9.3 (*)    Total Bilirubin 1.3 (*)    All other components within normal limits  CBC WITH DIFFERENTIAL/PLATELET - Abnormal; Notable for the following components:   WBC 15.0 (*)    Hemoglobin 15.2 (*)    Neutro Abs 12.2 (*)    Monocytes Absolute 1.1 (*)    All other components within normal limits  LIPASE, BLOOD  URINALYSIS, ROUTINE W REFLEX MICROSCOPIC  I-STAT BETA HCG BLOOD, ED ([facility], [facility], [facility] ONLY)    EKG None  Radiology No results found.  Procedures Procedures   Medications Ordered in ED Medications  morphine (PF) 4 MG/ML injection 4 mg (  4 mg Intravenous Given 07/02/22 1240)  diphenhydrAMINE (BENADRYL) injection 12.5 mg (12.5 mg Intravenous Given 07/02/22 1239)  LORazepam (ATIVAN) injection 1 mg (1 mg Intravenous Given 07/02/22 1237)  HYDROmorphone (DILAUDID) injection 1 mg (1 mg Intravenous Given 07/02/22 1307)  sodium chloride 0.9 % bolus 1,000 mL (0 mLs Intravenous Stopped 07/02/22 1632)  iohexol (OMNIPAQUE) 300 MG/ML solution 100 mL (100 mLs Intravenous Contrast Given 07/02/22 1438)    ED Course/ Medical Decision Making/ A&P                           Medical Decision Making Amount and/or Complexity of Data Reviewed Labs: ordered. Radiology: ordered.  Risk Prescription drug management.   36 year old female presenting today with abdominal pain,  nausea and vomiting. The differential diagnosis for generalized abdominal pain includes, but is not limited to AAA, gastroenteritis, appendicitis, Bowel obstruction, Bowel perforation. Gastroparesis, DKA, Hernia, Inflammatory bowel disease, mesenteric ischemia, pancreatitis, peritonitis SBP, volvulus.   This is not an exhaustive differential.    Past Medical History / Co-morbidities / Social History: Cyclical vomiting syndrome, status postcholecystectomy   Additional history: Per chart review patient has been seen 9 times for this over the past year.  Most recent CT scan was done in October of last year.  Reasonable to repeat at this time   Physical Exam: Pertinent physical exam findings include Diffuse tenderness Dry mm  Lab Tests: I ordered, and personally interpreted labs.  The pertinent results include: WBC 15 Normal COVID and flu   Imaging Studies: I ordered and independently visualized and interpreted CT abdomen pelvis and I agree with the radiologist that there are no acute findings    Medications: Initially ordered morphine with Benadryl for pain control.  She says that it brought her pain from a 10 to a 7.  Dilaudid ordered.  Also given Ativan for nausea due to her Zofran allergy, failed Phenergan and adverse reactions to Compazine.     MDM/Disposition: This is a 36 year old female with recurrent cyclical vomiting syndrome presenting today for the same.  She said this time she had some abdominal pain.  She had diffuse tenderness however with her leukocytosis I did pursue CT imaging.  CT was negative for any intra-abdominal source of her nausea and vomiting.  Status postcholecystectomy, normal-appearing appendix.  Suspect this to be a flare of her cyclical vomiting.  Normal lipase, urinalysis without signs of infection, doubt pancreatitis or pyelonephritis.  She was given IV hydration, antiemetics and pain control.  She reports feeling much better.  She is requesting a referral  to GI in [place].  This has been placed and she is hemodynamically stable for discharge.  Normal vital signs and ambulated out of the department  Final Clinical Impression(s) / ED Diagnoses Final diagnoses:  Nausea and vomiting, unspecified vomiting type    Rx / DC Orders ED Discharge Orders          Ordered    Ambulatory referral to Gastroenterology        07/02/22 1538    promethazine (PHENERGAN) 25 MG tablet  Every 6 hours PRN,   Status:  Discontinued        07/02/22 1539    metoCLOPramide (REGLAN) 10 MG tablet  Every 6 hours,   Status:  Discontinued        07/02/22 1539    HYDROcodone-acetaminophen (NORCO/VICODIN) 5-325 MG tablet  Every 4 hours PRN        07/02/22 1610  hydrOXYzine (ATARAX) 25 MG tablet  Every 6 hours        07/02/22 1610    promethazine (PHENERGAN) 25 MG tablet  Every 6 hours PRN        07/02/22 1643    metoCLOPramide (REGLAN) 10 MG tablet  Every 6 hours        07/02/22 1643           Results and diagnoses were explained to the patient. Return precautions discussed in full. Patient had no additional questions and expressed complete understanding.   This chart was dictated using voice recognition software.  Despite best efforts to proofread,  errors can occur which can change the documentation meaning.    Rhae Hammock, PA-C 07/02/22 1649    Elgie Congo, MD 07/03/22 0700

## 2022-07-02 NOTE — ED Notes (Signed)
Patient transported to CT 

## 2022-08-12 ENCOUNTER — Emergency Department (HOSPITAL_COMMUNITY)
Admission: EM | Admit: 2022-08-12 | Discharge: 2022-08-13 | Disposition: A | Discharge status: Home / Self Care | Payer: Medicaid Other | Attending: Emergency Medicine | Admitting: Emergency Medicine

## 2022-08-12 DIAGNOSIS — U071 COVID-19: Secondary | ICD-10-CM

## 2022-08-12 DIAGNOSIS — R112 Nausea with vomiting, unspecified: Secondary | ICD-10-CM | POA: Diagnosis not present

## 2022-08-12 DIAGNOSIS — I10 Essential (primary) hypertension: Secondary | ICD-10-CM | POA: Diagnosis not present

## 2022-08-12 DIAGNOSIS — E871 Hypo-osmolality and hyponatremia: Secondary | ICD-10-CM | POA: Insufficient documentation

## 2022-08-12 DIAGNOSIS — Z91010 Allergy to peanuts: Secondary | ICD-10-CM | POA: Diagnosis not present

## 2022-08-12 DIAGNOSIS — R197 Diarrhea, unspecified: Secondary | ICD-10-CM | POA: Diagnosis not present

## 2022-08-12 DIAGNOSIS — R109 Unspecified abdominal pain: Secondary | ICD-10-CM | POA: Diagnosis not present

## 2022-08-12 DIAGNOSIS — M791 Myalgia, unspecified site: Secondary | ICD-10-CM | POA: Diagnosis present

## 2022-08-13 ENCOUNTER — Other Ambulatory Visit: Payer: Self-pay

## 2022-08-13 ENCOUNTER — Encounter (HOSPITAL_COMMUNITY): Payer: Self-pay

## 2022-08-13 LAB — COMPREHENSIVE METABOLIC PANEL
ALT: 11 U/L (ref 0–44)
AST: 20 U/L (ref 15–41)
Albumin: 3.7 g/dL (ref 3.5–5.0)
Alkaline Phosphatase: 59 U/L (ref 38–126)
Anion gap: 7 (ref 5–15)
BUN: 11 mg/dL (ref 6–20)
CO2: 21 mmol/L — ABNORMAL LOW (ref 22–32)
Calcium: 8.4 mg/dL — ABNORMAL LOW (ref 8.9–10.3)
Chloride: 106 mmol/L (ref 98–111)
Creatinine, Ser: 0.78 mg/dL (ref 0.44–1.00)
GFR, Estimated: >60 mL/min (ref >60)
Glucose, Bld: 91 mg/dL (ref 70–99)
Potassium: 3.6 mmol/L (ref 3.5–5.1)
Sodium: 134 mmol/L — ABNORMAL LOW (ref 135–145)
Total Bilirubin: 0.6 mg/dL (ref 0.3–1.2)
Total Protein: 7.2 g/dL (ref 6.5–8.1)

## 2022-08-13 LAB — CBC WITH DIFFERENTIAL/PLATELET
Abs Immature Granulocytes: 0.02 10*3/uL (ref 0.00–0.07)
Basophils Absolute: 0 10*3/uL (ref 0.0–0.1)
Basophils Relative: 1 %
Eosinophils Absolute: 0 10*3/uL (ref 0.0–0.5)
Eosinophils Relative: 1 %
HCT: 35.9 % — ABNORMAL LOW (ref 36.0–46.0)
Hemoglobin: 11.8 g/dL — ABNORMAL LOW (ref 12.0–15.0)
Immature Granulocytes: 0 %
Lymphocytes Relative: 17 %
Lymphs Abs: 1 10*3/uL (ref 0.7–4.0)
MCH: 31.8 pg (ref 26.0–34.0)
MCHC: 32.9 g/dL (ref 30.0–36.0)
MCV: 96.8 fL (ref 80.0–100.0)
Monocytes Absolute: 0.9 10*3/uL (ref 0.1–1.0)
Monocytes Relative: 14 %
Neutro Abs: 4.3 10*3/uL (ref 1.7–7.7)
Neutrophils Relative %: 67 %
Platelets: 234 10*3/uL (ref 150–400)
RBC: 3.71 MIL/uL — ABNORMAL LOW (ref 3.87–5.11)
RDW: 13 % (ref 11.5–15.5)
WBC: 6.3 10*3/uL (ref 4.0–10.5)
nRBC: 0 % (ref 0.0–0.2)

## 2022-08-13 LAB — RESP PANEL BY RT-PCR (RSV, FLU A&B, COVID)  RVPGX2
Influenza A by PCR: NEGATIVE
Influenza B by PCR: NEGATIVE
Resp Syncytial Virus by PCR: NEGATIVE
SARS Coronavirus 2 by RT PCR: POSITIVE — AB

## 2022-08-13 LAB — LIPASE, BLOOD: Lipase: 57 U/L — ABNORMAL HIGH (ref 11–51)

## 2022-08-13 MED ORDER — DIPHENHYDRAMINE HCL 25 MG PO CAPS
25.0000 mg | ORAL_CAPSULE | Freq: Once | ORAL | Status: AC
  Administered 2022-08-13: 25 mg via ORAL
  Filled 2022-08-13: qty 1

## 2022-08-13 MED ORDER — HYDROMORPHONE HCL 1 MG/ML IJ SOLN
1.0000 mg | Freq: Once | INTRAMUSCULAR | Status: AC
  Administered 2022-08-13: 1 mg via INTRAVENOUS
  Filled 2022-08-13: qty 1

## 2022-08-13 MED ORDER — SODIUM CHLORIDE 0.9 % IV BOLUS
1000.0000 mL | Freq: Once | INTRAVENOUS | Status: AC
  Administered 2022-08-13: 1000 mL via INTRAVENOUS

## 2022-08-13 MED ORDER — PROMETHAZINE HCL 12.5 MG PO TABS: 12.5000 mg | ORAL_TABLET | Freq: Three times a day (TID) | ORAL | 0 refills | Status: DC | PRN

## 2022-08-13 MED ORDER — METOCLOPRAMIDE HCL 5 MG/ML IJ SOLN
10.0000 mg | Freq: Once | INTRAMUSCULAR | Status: AC
  Administered 2022-08-13: 10 mg via INTRAVENOUS
  Filled 2022-08-13: qty 2

## 2022-08-13 NOTE — ED Provider Notes (Signed)
[facility] DEPART[facility] AT [facility] Provider Note   CSN: [id number] Arrival date & time: 08/12/22  2338     History  Chief Complaint  Patient presents with   Nausea   Emesis    Sherry Key is a 37 y.o. female.  HPI   Medical history including cyclic vomiting, hypertension, presenting with complaints of nausea vomiting general body aches.  Patient states that patient has been having intermittent nausea vomiting for the last week, she denies any bloody emesis or coffee-ground emesis, most recently vomited-at 10 PM, she states that she is still passing gas having normal bowel movements, she does endorse that it is diarrhea, denies bloody stools or dark tarry stools, she notes that she has been having general body aches subjective fever and chills cough and congestion, she denies any known sick contacts, not immunocompromise, has not gotten her COVID or influenza vaccine.  She denies any history of stomach surgeries, denies excessive alcohol use or NSAID use.  She does also note that she is having a headache, for about a week, intermittent, denies any change in vision paresthesias or weakness of the upper or lower extremities.    Home Medications Prior to Admission medications   Medication Sig Start Date End Date Taking? Authorizing Provider  promethazine (PHENERGAN) 12.5 MG tablet Take 1 tablet (12.5 mg total) by mouth every 8 (eight) hours as needed for up to 5 days for nausea or vomiting. 08/13/22 08/18/22 Yes Marcello Fennel, PA-C  Doxylamine-Pyridoxine 10-10 MG TBEC Take 1 tablet by mouth every 8 (eight) hours as needed. 10/20/21   Long, Wonda Olds, MD  flintstones complete (FLINTSTONES) 60 MG chewable tablet Chew 1 tablet by mouth daily.    [provider]  HYDROcodone-acetaminophen (NORCO/VICODIN) 5-325 MG tablet Take 1 tablet by mouth every 4 (four) hours as needed. 07/02/22   Redwine, Madison A, PA-C  hydrOXYzine (ATARAX) 25 MG tablet Take 1  tablet (25 mg total) by mouth every 6 (six) hours. 07/02/22   Redwine, Madison A, PA-C  metoCLOPramide (REGLAN) 10 MG tablet Take 1 tablet (10 mg total) by mouth every 6 (six) hours. 07/02/22   Redwine, Madison A, PA-C      Allergies    Dicyclomine, Morphine, Percocet [oxycodone-acetaminophen], Zofran [ondansetron hcl], Ketorolac, Other, Peanut-containing drug products, Percocet [oxycodone-acetaminophen], Tomato, Zofran, and Haloperidol    Review of Systems   Review of Systems  Constitutional:  Positive for chills and fever.  Respiratory:  Negative for shortness of breath.   Cardiovascular:  Negative for chest pain.  Gastrointestinal:  Positive for abdominal pain, diarrhea, nausea and vomiting.  Neurological:  Positive for headaches.    Physical Exam Updated Vital Signs BP (!) 144/86   Pulse 75   Temp 98.4 F (36.9 C) (Oral)   Resp 18   LMP 10/19/2019   SpO2 98%  Physical Exam Vitals and nursing note reviewed.  Constitutional:      General: She is not in acute distress.    Appearance: She is not ill-appearing.  HENT:     Head: Normocephalic and atraumatic.     Nose: No congestion.     Mouth/Throat:     Mouth: Mucous membranes are moist.  Eyes:     Conjunctiva/sclera: Conjunctivae normal.  Cardiovascular:     Rate and Rhythm: Normal rate and regular rhythm.     Pulses: Normal pulses.     Heart sounds: No murmur heard.    No friction rub. No gallop.  Pulmonary:  Effort: No respiratory distress.     Breath sounds: No wheezing, rhonchi or rales.  Abdominal:     Palpations: Abdomen is soft.     Tenderness: There is abdominal tenderness. There is no right CVA tenderness or left CVA tenderness.     Comments: Abdomen nondistended, soft, slight tenderness on the epigastric region without guarding rebound tenderness or peritoneal sign negative Murphy sign McBurney point  Skin:    General: Skin is warm and dry.  Neurological:     Mental Status: She is alert.     GCS: GCS  eye subscore is 4. GCS verbal subscore is 5. GCS motor subscore is 6.     Cranial Nerves: Cranial nerves 2-12 are intact. No cranial nerve deficit.     Motor: No weakness.     Coordination: Romberg sign negative. Finger-Nose-Finger Test normal.     Comments: Cranial nerves II through XII grossly intact no difficulty with word finding following two-step commands there is no unilateral weakness present.  Psychiatric:        Mood and Affect: Mood normal.     ED Results / Procedures / Treatments   Labs (all labs ordered are listed, but only abnormal results are displayed) Labs Reviewed  RESP PANEL BY RT-PCR (RSV, FLU A&B, COVID)  RVPGX2 - Abnormal; Notable for the following components:      Result Value   SARS Coronavirus 2 by RT PCR POSITIVE (*)    All other components within normal limits  COMPREHENSIVE METABOLIC PANEL - Abnormal; Notable for the following components:   Sodium 134 (*)    CO2 21 (*)    Calcium 8.4 (*)    All other components within normal limits  LIPASE, BLOOD - Abnormal; Notable for the following components:   Lipase 57 (*)    All other components within normal limits  CBC WITH DIFFERENTIAL/PLATELET - Abnormal; Notable for the following components:   RBC 3.71 (*)    Hemoglobin 11.8 (*)    HCT 35.9 (*)    All other components within normal limits  CBC WITH DIFFERENTIAL/PLATELET  PREGNANCY, URINE  RAPID URINE DRUG SCREEN, HOSP PERFORMED    EKG None  Radiology No results found.  Procedures Procedures    Medications Ordered in ED Medications  metoCLOPramide (REGLAN) injection 10 mg (10 mg Intravenous Given 08/13/22 0117)  diphenhydrAMINE (BENADRYL) capsule 25 mg (25 mg Oral Given 08/13/22 0117)  sodium chloride 0.9 % bolus 1,000 mL (1,000 mLs Intravenous Bolus 08/13/22 0116)  HYDROmorphone (DILAUDID) injection 1 mg (1 mg Intravenous Given 08/13/22 0325)    ED Course/ Medical Decision Making/ A&P                             Medical Decision Making Amount  and/or Complexity of Data Reviewed Labs: ordered.  Risk Prescription drug management.   This patient presents to the ED for concern of nausea vomiting, this involves an extensive number of treatment options, and is a complaint that carries with it a high risk of complications and morbidity.  The differential diagnosis includes CVA, bowel obstruction, volvulus, pancreatitis URI    Additional history obtained:  Additional history obtained from N/A External records from outside source obtained and reviewed including recent ER notes   Co morbidities that complicate the patient evaluation  Cyclic vomiting  Social Determinants of Health:  Marijuana use    Lab Tests:  I Ordered, and personally interpreted labs.  The pertinent results  include: CBC shows normocytic anemia hemoglobin 11.8 CMP shows slight decrease in sodium of 134, CO2 of 21, respiratory panel  positive for COVID, lipase 57   Imaging Studies ordered:  I ordered imaging studies including N/A I independently visualized and interpreted imaging which showed N/A I agree with the radiologist interpretation   Cardiac Monitoring:  The patient was maintained on a cardiac monitor.  I personally viewed and interpreted the cardiac monitored which showed an underlying rhythm of: N/A   Medicines ordered and prescription drug management:  I ordered medication including fluids, pain medication, migraine cocktail I have reviewed the patients home medicines and have made adjustments as needed  Critical Interventions:  Manage   Reevaluation:  Presents with nausea vomiting headaches, presentation seems consistent with viral gastroenteritis, will provide her with migraine cocktail, fluids, and reassess.  Lab work is reassuring, positive for COVID which seems consistent with her physical exam and symptoms, I reassessed the patient her abdomen is soft nontender, she is tolerating p.o., she is in agreement with discharge at  this time.   Consultations Obtained:  N/A    Test Considered:  CT AP-deferred as my suspicion for intra-abdominal abnormality is low as she has a nonsurgical abdomen nontoxic-appearing vital signs reassuring.    Rule out low suspicion for internal head bleed and or mass not on anticoag's, no recent head trauma, presentation atypical, patient  had CT imaging performed 2 years negative for evidence of brain mass, suspect headache is from COVID infection.  Low suspicion for CVA she has no focal deficit present my exam.  Low suspicion for dissection of the vertebral or carotid artery as presentation atypical of etiology.  Low suspicion for meningitis as she has no meningeal sign present. low suspicion for lower lobe pneumonia as lung sounds are clear bilaterally, will defer imaging at this time.  I have low suspicion for liver or gallbladder abnormality as she has no right upper quadrant tenderness, liver enzymes, alk phos, T bili all within normal limits.  Low suspicion for pancreatitis as lipase is within normal limits.  Low suspicion for ruptured stomach ulcer as she has no peritoneal sign present on exam.  Low suspicion for bowel obstruction as abdomen is nondistended normal bowel sounds, so passing gas and having normal bowel movements.  Low suspicion for complicated diverticulitis as she is nontoxic-appearing, vital signs reassuring no leukocytosis present.    Dispostion and problem list  After consideration of the diagnostic results and the patients response to treatment, I feel that the patent would benefit from discharge.  COVID-patient is outside the treatment window for antivirals, will recommend symptom management, follow-up PCP as needed strict return precautions Nausea vomiting diarrhea-suspect this is gastroenteritis, will recommend bland diet, will provide antiemetics follow-up with PCP as needed strict return precautions.            Final Clinical Impression(s) / ED  Diagnoses Final diagnoses:  COVID  Nausea vomiting and diarrhea    Rx / DC Orders ED Discharge Orders          Ordered    promethazine (PHENERGAN) 12.5 MG tablet  Every 8 hours PRN        08/13/22 0417              Marcello Fennel, PA-C 61/95/09 [id number]    [name], MD 12/45/80 [phone]

## 2022-08-13 NOTE — Discharge Instructions (Addendum)
You have covid I recommend over-the-counter pain medications like ibuprofen Tylenol for fever and pain control, nasal decongestions like Flonase and Zyrtec, Mucinex for cough.  If not eating recommend supplementing with Gatorade to help with electrolyte supplementation.  Regards to your diarrhea I recommend a bland diet, given you Phenergan please take as prescribed.  Follow-up PCP for further evaluation.  Come back to the emergency department if you develop chest pain, shortness of breath, severe abdominal pain, uncontrolled nausea, vomiting, diarrhea.

## 2022-09-26 ENCOUNTER — Other Ambulatory Visit: Payer: Self-pay

## 2022-09-26 ENCOUNTER — Encounter (HOSPITAL_COMMUNITY): Payer: Self-pay

## 2022-09-26 ENCOUNTER — Emergency Department (HOSPITAL_COMMUNITY)
Admission: EM | Admit: 2022-09-26 | Discharge: 2022-09-26 | Disposition: A | Discharge status: Home / Self Care | Payer: Medicaid Other | Attending: Emergency Medicine | Admitting: Emergency Medicine

## 2022-09-26 DIAGNOSIS — R197 Diarrhea, unspecified: Secondary | ICD-10-CM | POA: Insufficient documentation

## 2022-09-26 DIAGNOSIS — Z91010 Allergy to peanuts: Secondary | ICD-10-CM | POA: Insufficient documentation

## 2022-09-26 DIAGNOSIS — R1084 Generalized abdominal pain: Secondary | ICD-10-CM | POA: Insufficient documentation

## 2022-09-26 DIAGNOSIS — E860 Dehydration: Secondary | ICD-10-CM | POA: Diagnosis not present

## 2022-09-26 DIAGNOSIS — R1110 Vomiting, unspecified: Secondary | ICD-10-CM

## 2022-09-26 DIAGNOSIS — Z8616 Personal history of COVID-19: Secondary | ICD-10-CM | POA: Insufficient documentation

## 2022-09-26 LAB — URINALYSIS, ROUTINE W REFLEX MICROSCOPIC
Glucose, UA: NEGATIVE mg/dL
Ketones, ur: 5 mg/dL — AB
Leukocytes,Ua: NEGATIVE
Nitrite: NEGATIVE
Protein, ur: 30 mg/dL — AB
Specific Gravity, Urine: 1.03 (ref 1.005–1.030)
pH: 6 (ref 5.0–8.0)

## 2022-09-26 LAB — CBC WITH DIFFERENTIAL/PLATELET
Abs Immature Granulocytes: 0.04 10*3/uL (ref 0.00–0.07)
Basophils Absolute: 0 10*3/uL (ref 0.0–0.1)
Basophils Relative: 0 %
Eosinophils Absolute: 0.1 10*3/uL (ref 0.0–0.5)
Eosinophils Relative: 1 %
HCT: 42.2 % (ref 36.0–46.0)
Hemoglobin: 13.9 g/dL (ref 12.0–15.0)
Immature Granulocytes: 0 %
Lymphocytes Relative: 12 %
Lymphs Abs: 1.2 10*3/uL (ref 0.7–4.0)
MCH: 31.4 pg (ref 26.0–34.0)
MCHC: 32.9 g/dL (ref 30.0–36.0)
MCV: 95.5 fL (ref 80.0–100.0)
Monocytes Absolute: 0.7 10*3/uL (ref 0.1–1.0)
Monocytes Relative: 7 %
Neutro Abs: 8 10*3/uL — ABNORMAL HIGH (ref 1.7–7.7)
Neutrophils Relative %: 80 %
Platelets: 263 10*3/uL (ref 150–400)
RBC: 4.42 MIL/uL (ref 3.87–5.11)
RDW: 13 % (ref 11.5–15.5)
WBC: 10.1 10*3/uL (ref 4.0–10.5)
nRBC: 0 % (ref 0.0–0.2)

## 2022-09-26 LAB — COMPREHENSIVE METABOLIC PANEL
ALT: 16 U/L (ref 0–44)
AST: 20 U/L (ref 15–41)
Albumin: 4.5 g/dL (ref 3.5–5.0)
Alkaline Phosphatase: 71 U/L (ref 38–126)
Anion gap: 5 (ref 5–15)
BUN: 11 mg/dL (ref 6–20)
CO2: 22 mmol/L (ref 22–32)
Calcium: 8.9 mg/dL (ref 8.9–10.3)
Chloride: 108 mmol/L (ref 98–111)
Creatinine, Ser: 0.78 mg/dL (ref 0.44–1.00)
GFR, Estimated: >60 mL/min (ref >60)
Glucose, Bld: 113 mg/dL — ABNORMAL HIGH (ref 70–99)
Potassium: 3.4 mmol/L — ABNORMAL LOW (ref 3.5–5.1)
Sodium: 135 mmol/L (ref 135–145)
Total Bilirubin: 1.2 mg/dL (ref 0.3–1.2)
Total Protein: 8.2 g/dL — ABNORMAL HIGH (ref 6.5–8.1)

## 2022-09-26 LAB — I-STAT BETA HCG BLOOD, ED (MC, WL, AP ONLY): I-stat hCG, quantitative: <5 m[IU]/mL (ref <5)

## 2022-09-26 LAB — LACTIC ACID, PLASMA: Lactic Acid, Venous: 1.2 mmol/L (ref 0.5–1.9)

## 2022-09-26 LAB — LIPASE, BLOOD: Lipase: 28 U/L (ref 11–51)

## 2022-09-26 MED ORDER — LACTATED RINGERS IV BOLUS
1000.0000 mL | Freq: Once | INTRAVENOUS | Status: AC
  Administered 2022-09-26: 1000 mL via INTRAVENOUS

## 2022-09-26 MED ORDER — LORAZEPAM 2 MG/ML IJ SOLN
1.0000 mg | Freq: Once | INTRAMUSCULAR | Status: AC
  Administered 2022-09-26: 1 mg via INTRAVENOUS
  Filled 2022-09-26: qty 1

## 2022-09-26 MED ORDER — PANTOPRAZOLE SODIUM 40 MG IV SOLR
40.0000 mg | Freq: Once | INTRAVENOUS | Status: AC
  Administered 2022-09-26: 40 mg via INTRAVENOUS
  Filled 2022-09-26: qty 10

## 2022-09-26 MED ORDER — SODIUM CHLORIDE 0.9 % IV SOLN
25.0000 mg | Freq: Once | INTRAVENOUS | Status: AC
  Administered 2022-09-26: 25 mg via INTRAVENOUS
  Filled 2022-09-26: qty 25

## 2022-09-26 MED ORDER — OMEPRAZOLE 20 MG PO CPDR: 20.0000 mg | DELAYED_RELEASE_CAPSULE | Freq: Every day | ORAL | 0 refills | Status: DC

## 2022-09-26 MED ORDER — CAPSAICIN 0.025 % EX CREA
TOPICAL_CREAM | Freq: Once | CUTANEOUS | Status: AC
  Filled 2022-09-26: qty 60

## 2022-09-26 MED ORDER — DIPHENHYDRAMINE HCL 50 MG/ML IJ SOLN
25.0000 mg | Freq: Once | INTRAMUSCULAR | Status: AC
  Administered 2022-09-26: 25 mg via INTRAVENOUS
  Filled 2022-09-26: qty 1

## 2022-09-26 NOTE — Discharge Instructions (Addendum)
1.  Continue taking your Phenergan for the next 12 to 24 hours.  Sip fluids frequently to stay hydrated.  If you are tolerating your fluids and or not having further vomiting, you may start eating very bland foods and take less Phenergan. 2.  Try to avoid all marijuana use.  Review information about cannabinol hyperemesis syndrome.  If you have this, it can take a long time to resolve.  Even occasional marijuana use once this is started can trigger it. 3.  You should have a family doctor.  Follow-up for recheck within the next 3 to 5 days.  Return to the emergency department if you have recurrent vomiting, feel you are getting dehydrated or other worsening or changing symptoms. 4.  Start taking Meprazole daily.  Take this in the morning about 30 minutes before you eat anything.  This will help with stomach irritation and inflammation.

## 2022-09-26 NOTE — ED Triage Notes (Signed)
Patient BIB [facility] from home. History of Chrohn's disease. Generalized abdominal pain since Sunday. Vomiting and diarrhea along with lower back pain. No dizziness or shortness of breath.

## 2022-09-26 NOTE — ED Provider Notes (Signed)
[place] EMERGENCY DEPARTMENT AT [facility] Provider Note   CSN: [id number] Arrival date & time: 09/26/22  [id number]     History  Chief Complaint  Patient presents with   Abdominal Pain    Sherry Key is a 37 y.o. female.  HPI Reports that she was at work Sunday, 2 days ago.  She had abrupt onset of vomiting and diarrheal illness and had to leave work.  She reports symptoms continued Monday with copious diarrhea, watery and yellow in appearance.  Also vomiting continued.  She reports diarrhea has stopped but overnight she continued to have dry heaves until this morning.  Patient reports she started to get generally weak and feel dehydrated.  That time she called EMS for transport to the emergency department.  Patient reports she has some diffuse abdominal pain more to the right lateral and upper abdomen.  Reports she has intermittently had problems with vomiting illness.  She reports the last time she was sick like that she had COVID.  She does have Phenergan and Ativan at home that she uses for episodes of nausea and vomiting.  She reports Reglan and Compazine give her bizarre symptoms with her her tongue and mouth.    Home Medications Prior to Admission medications   Medication Sig Start Date End Date Taking? Authorizing Provider  Doxylamine-Pyridoxine 10-10 MG TBEC Take 1 tablet by mouth every 8 (eight) hours as needed. 10/20/21   Long, Wonda Olds, MD  flintstones complete (FLINTSTONES) 60 MG chewable tablet Chew 1 tablet by mouth daily.    [provider]  HYDROcodone-acetaminophen (NORCO/VICODIN) 5-325 MG tablet Take 1 tablet by mouth every 4 (four) hours as needed. 07/02/22   Redwine, Madison A, PA-C  hydrOXYzine (ATARAX) 25 MG tablet Take 1 tablet (25 mg total) by mouth every 6 (six) hours. 07/02/22   Redwine, Madison A, PA-C  metoCLOPramide (REGLAN) 10 MG tablet Take 1 tablet (10 mg total) by mouth every 6 (six) hours. 07/02/22   Redwine, Madison A, PA-C   promethazine (PHENERGAN) 12.5 MG tablet Take 1 tablet (12.5 mg total) by mouth every 8 (eight) hours as needed for up to 5 days for nausea or vomiting. 08/13/22 08/18/22  Marcello Fennel, PA-C      Allergies    Dicyclomine, Morphine, Percocet [oxycodone-acetaminophen], Zofran Alvis Lemmings hcl], Ketorolac, Other, Peanut-containing drug products, Percocet [oxycodone-acetaminophen], Tomato, Zofran, and Haloperidol    Review of Systems   Review of Systems  Physical Exam Updated Vital Signs BP (!) 127/91   Pulse 91   Temp 97.8 F (36.6 C) (Oral)   Resp 16   Ht 5\' 3"  (1.6 m)   Wt 72.6 kg   LMP 10/19/2019   SpO2 99%   BMI 28.34 kg/m  Physical Exam Constitutional:      Comments: Patient is alert with clear mental status.  No respiratory distress.  Nontoxic in appearance.  HENT:     Mouth/Throat:     Mouth: Mucous membranes are moist.     Pharynx: Oropharynx is clear.  Eyes:     Extraocular Movements: Extraocular movements intact.     Conjunctiva/sclera: Conjunctivae normal.     Pupils: Pupils are equal, round, and reactive to light.  Cardiovascular:     Rate and Rhythm: Normal rate and regular rhythm.  Pulmonary:     Effort: Pulmonary effort is normal.     Breath sounds: Normal breath sounds.  Abdominal:     Comments: Soft without guarding.  Moderate discomfort right mid  to lateral abdomen.  Musculoskeletal:        General: No swelling or tenderness. Normal range of motion.     Right lower leg: No edema.     Left lower leg: No edema.  Skin:    General: Skin is warm and dry.  Neurological:     General: No focal deficit present.     Mental Status: She is oriented to person, place, and time.     Coordination: Coordination normal.  Psychiatric:        Mood and Affect: Mood normal.     ED Results / Procedures / Treatments   Labs (all labs ordered are listed, but only abnormal results are displayed) Labs Reviewed  COMPREHENSIVE METABOLIC PANEL - Abnormal; Notable for  the following components:      Result Value   Potassium 3.4 (*)    Glucose, Bld 113 (*)    Total Protein 8.2 (*)    All other components within normal limits  CBC WITH DIFFERENTIAL/PLATELET - Abnormal; Notable for the following components:   Neutro Abs 8.0 (*)    All other components within normal limits  URINALYSIS, ROUTINE W REFLEX MICROSCOPIC - Abnormal; Notable for the following components:   Hgb urine dipstick SMALL (*)    Bilirubin Urine SMALL (*)    Ketones, ur 5 (*)    Protein, ur 30 (*)    Bacteria, UA RARE (*)    All other components within normal limits  LACTIC ACID, PLASMA  LIPASE, BLOOD  LACTIC ACID, PLASMA  I-STAT BETA HCG BLOOD, ED ([facility], [facility], [facility] ONLY)    EKG None  Radiology No results found.  Procedures Procedures    Medications Ordered in ED Medications  capsaicin (ZOSTRIX) 0.025 % cream (has no administration in time range)  lactated ringers bolus 1,000 mL (0 mLs Intravenous Stopped 09/26/22 1138)  promethazine (PHENERGAN) 25 mg in sodium chloride 0.9 % 50 mL IVPB (0 mg Intravenous Stopped 09/26/22 1121)  LORazepam (ATIVAN) injection 1 mg (1 mg Intravenous Given 09/26/22 1255)  lactated ringers bolus 1,000 mL (1,000 mLs Intravenous New Bag/Given 09/26/22 1256)  pantoprazole (PROTONIX) injection 40 mg (40 mg Intravenous Given 09/26/22 1445)  diphenhydrAMINE (BENADRYL) injection 25 mg (25 mg Intravenous Given 09/26/22 1445)    ED Course/ Medical Decision Making/ A&P Clinical Course as of 09/26/22 [place]  Tue Sep 26, 2022  1525 36 F with [VB]    Clinical Course User Index [VB] Elgie Congo, MD                             Medical Decision Making Amount and/or Complexity of Data Reviewed Labs: ordered.  Risk OTC drugs. Prescription drug management.   Presents with copious vomiting and diarrheal illness.  Proceed with lab work and rehydration.  At this time patient's abdominal examination is nonsurgical in nature.  Blood pressures are stable.  She  has had multiple CT abdomens in the past for abdominal pain.  At this time I do not feel that CT imaging is indicated.  Will initiate fluid resuscitation and nausea control with Phenergan and Ativan based on discussion of prior medications are effective.  Lab work has returned without significant metabolic derangement or leukocytosis.  Urinalysis is a contaminated specimen with squamous epithelial cells.  Patient does not endorse symptoms of UTI.  Upon recheck after 2 L of fluids, Ativan and Phenergan, patient reports she is having a lot of abdominal cramping but  nausea and vomiting have alleviated.  She does have multiple allergies or intolerances for medications.  Patient does endorse marijuana use differential includes cannabinoid hyperemesis.  Will add topical capsaicin cream and Benadryl for abdominal cramping and discomfort.  Patient adamantly reports adverse reaction with Haldol.  Do feel patient can be adequately controlled symptomatically will be appropriate for discharge and continued use of Zofran and PPI at home.  15: 44 patient reports symptoms are now improved with capsaicin and Protonix.  She is lying flat comfortably and no further vomiting.  We have extensively reviewed a plan of continuing Phenergan for several more doses at home and then continuing oral hydration.  We also reviewed discontinuation of any marijuana in the event this is an episode of cannabinol hyperemesis.  Patient voices understanding.  I will also plan to initiate Meprazole daily for suspected gastritis.        Final Clinical Impression(s) / ED Diagnoses Final diagnoses:  Vomiting and diarrhea  Dehydration  Generalized abdominal pain    Rx / DC Orders ED Discharge Orders     None         Charlesetta Shanks, MD 09/26/22 1545

## 2022-09-26 NOTE — ED Notes (Signed)
Pt vomiting at this time. Requesting nausea meds and ativan. EDP made aware.

## 2022-10-06 ENCOUNTER — Emergency Department (HOSPITAL_COMMUNITY)
Admission: EM | Admit: 2022-10-06 | Discharge: 2022-10-07 | Disposition: A | Discharge status: Home / Self Care | Payer: Medicaid Other | Attending: Emergency Medicine | Admitting: Emergency Medicine

## 2022-10-06 ENCOUNTER — Encounter (HOSPITAL_COMMUNITY): Payer: Self-pay

## 2022-10-06 DIAGNOSIS — R1115 Cyclical vomiting syndrome unrelated to migraine: Secondary | ICD-10-CM

## 2022-10-06 DIAGNOSIS — Z91010 Allergy to peanuts: Secondary | ICD-10-CM | POA: Insufficient documentation

## 2022-10-06 DIAGNOSIS — I10 Essential (primary) hypertension: Secondary | ICD-10-CM | POA: Insufficient documentation

## 2022-10-06 DIAGNOSIS — R1110 Vomiting, unspecified: Secondary | ICD-10-CM | POA: Diagnosis present

## 2022-10-06 NOTE — ED Triage Notes (Signed)
Pt arrived for [facility] for n/v and nose bleed all day. Pt has hx of n/v due to cannabinoid hyperemesis syndrome and cyclical vomiting. EMS was not able to give Zofran d/t allergy, HTN noted per EMS, pt has HX of same. Pt A&O x4. Pt actively vomiting on arrival BP 208/136.

## 2022-10-07 ENCOUNTER — Emergency Department (HOSPITAL_COMMUNITY): Payer: Medicaid Other

## 2022-10-07 LAB — URINALYSIS, ROUTINE W REFLEX MICROSCOPIC
Bacteria, UA: NONE SEEN
Bilirubin Urine: NEGATIVE
Glucose, UA: NEGATIVE mg/dL
Ketones, ur: 20 mg/dL — AB
Leukocytes,Ua: NEGATIVE
Nitrite: NEGATIVE
Protein, ur: 30 mg/dL — AB
Specific Gravity, Urine: 1.028 (ref 1.005–1.030)
pH: 7 (ref 5.0–8.0)

## 2022-10-07 LAB — COMPREHENSIVE METABOLIC PANEL
ALT: 17 U/L (ref 0–44)
AST: 22 U/L (ref 15–41)
Albumin: 5 g/dL (ref 3.5–5.0)
Alkaline Phosphatase: 77 U/L (ref 38–126)
Anion gap: 11 (ref 5–15)
BUN: 14 mg/dL (ref 6–20)
CO2: 23 mmol/L (ref 22–32)
Calcium: 9.7 mg/dL (ref 8.9–10.3)
Chloride: 104 mmol/L (ref 98–111)
Creatinine, Ser: 0.79 mg/dL (ref 0.44–1.00)
GFR, Estimated: >60 mL/min (ref >60)
Glucose, Bld: 131 mg/dL — ABNORMAL HIGH (ref 70–99)
Potassium: 2.9 mmol/L — ABNORMAL LOW (ref 3.5–5.1)
Sodium: 138 mmol/L (ref 135–145)
Total Bilirubin: 0.9 mg/dL (ref 0.3–1.2)
Total Protein: 9.3 g/dL — ABNORMAL HIGH (ref 6.5–8.1)

## 2022-10-07 LAB — CBC
HCT: 43 % (ref 36.0–46.0)
Hemoglobin: 14.4 g/dL (ref 12.0–15.0)
MCH: 31.8 pg (ref 26.0–34.0)
MCHC: 33.5 g/dL (ref 30.0–36.0)
MCV: 94.9 fL (ref 80.0–100.0)
Platelets: 333 10*3/uL (ref 150–400)
RBC: 4.53 MIL/uL (ref 3.87–5.11)
RDW: 12.8 % (ref 11.5–15.5)
WBC: 18.8 10*3/uL — ABNORMAL HIGH (ref 4.0–10.5)
nRBC: 0 % (ref 0.0–0.2)

## 2022-10-07 LAB — PREGNANCY, URINE: Preg Test, Ur: NEGATIVE

## 2022-10-07 LAB — LIPASE, BLOOD: Lipase: 24 U/L (ref 11–51)

## 2022-10-07 MED ORDER — POTASSIUM CHLORIDE 10 MEQ/100ML IV SOLN
10.0000 meq | INTRAVENOUS | Status: AC
  Administered 2022-10-07 (×2): 10 meq via INTRAVENOUS
  Filled 2022-10-07 (×2): qty 100

## 2022-10-07 MED ORDER — LABETALOL HCL 5 MG/ML IV SOLN: 5.0000 mg | Freq: Once | INTRAVENOUS | Status: DC

## 2022-10-07 MED ORDER — SODIUM CHLORIDE 0.9 % IV SOLN
12.5000 mg | Freq: Once | INTRAVENOUS | Status: AC
  Administered 2022-10-07: 12.5 mg via INTRAVENOUS
  Filled 2022-10-07: qty 12.5

## 2022-10-07 MED ORDER — LACTATED RINGERS IV BOLUS
1000.0000 mL | Freq: Once | INTRAVENOUS | Status: AC
  Administered 2022-10-07: 1000 mL via INTRAVENOUS

## 2022-10-07 MED ORDER — SODIUM CHLORIDE (PF) 0.9 % IJ SOLN
INTRAMUSCULAR | Status: AC
  Administered 2022-10-07: 10 mL
  Filled 2022-10-07: qty 50

## 2022-10-07 MED ORDER — DIPHENHYDRAMINE HCL 50 MG/ML IJ SOLN
25.0000 mg | Freq: Once | INTRAMUSCULAR | Status: AC
  Administered 2022-10-07: 25 mg via INTRAVENOUS
  Filled 2022-10-07: qty 1

## 2022-10-07 MED ORDER — IOHEXOL 300 MG/ML  SOLN
100.0000 mL | Freq: Once | INTRAMUSCULAR | Status: AC | PRN
  Administered 2022-10-07: 100 mL via INTRAVENOUS

## 2022-10-07 MED ORDER — HYDROMORPHONE HCL 1 MG/ML IJ SOLN
1.0000 mg | Freq: Once | INTRAMUSCULAR | Status: AC
  Administered 2022-10-07: 1 mg via INTRAVENOUS
  Filled 2022-10-07: qty 1

## 2022-10-07 MED ORDER — POTASSIUM CHLORIDE CRYS ER 20 MEQ PO TBCR
40.0000 meq | EXTENDED_RELEASE_TABLET | Freq: Once | ORAL | Status: AC
  Administered 2022-10-07: 40 meq via ORAL
  Filled 2022-10-07: qty 2

## 2022-10-07 MED ORDER — METOCLOPRAMIDE HCL 5 MG/ML IJ SOLN
10.0000 mg | Freq: Once | INTRAMUSCULAR | Status: AC
  Administered 2022-10-07: 10 mg via INTRAVENOUS
  Filled 2022-10-07: qty 2

## 2022-10-07 NOTE — Discharge Instructions (Addendum)
Your workup today was overall reassuring.  Your blood pressure was elevated today.  Please follow-up with your primary care doctor to have this rechecked as if this is elevated consistently you may need to start medications for high blood pressure.  Return to the ER for any new or worsening symptoms.

## 2022-10-07 NOTE — ED Provider Notes (Signed)
[place] EMERGENCY DEPARTMENT AT [facility] Provider Note   CSN: [id number] Arrival date & time: 10/06/22  2315     History  Chief Complaint  Patient presents with   Vomiting    Sherry Key is a 37 y.o. female.  HPI 37 year old female with history of Crohn's disease, cyclic vomiting, PCOS, endometriosis, hypertension, cannabis hyperemesis presents to the ER with concerns for nausea, vomiting which began early this morning.  Reports severe abdominal pain, stabbing, states "I think it is my appendix".  Denies fevers, history is difficult to obtain as the patient is moaning and crying.    Home Medications Prior to Admission medications   Medication Sig Start Date End Date Taking? Authorizing Provider  acetaminophen (TYLENOL) 500 MG tablet Take 500 mg by mouth every 6 (six) hours as needed for moderate pain or mild pain.   Yes [provider]  ibuprofen (ADVIL) 200 MG tablet Take 200 mg by mouth every 6 (six) hours as needed for moderate pain.   Yes [provider]  promethazine (PHENERGAN) 25 MG tablet Take 25 mg by mouth every 6 (six) hours as needed for nausea or vomiting.   Yes [provider]  Doxylamine-Pyridoxine 10-10 MG TBEC Take 1 tablet by mouth every 8 (eight) hours as needed. Patient not taking: Reported on 10/07/2022 10/20/21   Long, Wonda Olds, MD  HYDROcodone-acetaminophen (NORCO/VICODIN) 5-325 MG tablet Take 1 tablet by mouth every 4 (four) hours as needed. Patient not taking: Reported on 10/07/2022 07/02/22   Redwine, Madison A, PA-C  hydrOXYzine (ATARAX) 25 MG tablet Take 1 tablet (25 mg total) by mouth every 6 (six) hours. Patient not taking: Reported on 10/07/2022 07/02/22   Redwine, Madison A, PA-C  metoCLOPramide (REGLAN) 10 MG tablet Take 1 tablet (10 mg total) by mouth every 6 (six) hours. Patient not taking: Reported on 10/07/2022 07/02/22   Redwine, Madison A, PA-C  omeprazole (PRILOSEC) 20 MG capsule Take 1 capsule  (20 mg total) by mouth daily. Patient not taking: Reported on 10/07/2022 09/26/22   Charlesetta Shanks, MD  promethazine (PHENERGAN) 12.5 MG tablet Take 1 tablet (12.5 mg total) by mouth every 8 (eight) hours as needed for up to 5 days for nausea or vomiting. 08/13/22 08/18/22  Marcello Fennel, PA-C      Allergies    Dicyclomine, Morphine, Percocet [oxycodone-acetaminophen], Zofran Alvis Lemmings hcl], Ketorolac, Other, Peanut-containing drug products, Percocet [oxycodone-acetaminophen], Tomato, Zofran, and Haloperidol    Review of Systems   Review of Systems  Physical Exam Updated Vital Signs BP (!) 144/99   Pulse 65   Temp 98 F (36.7 C) (Oral)   Resp 13   Ht 5\' 3"  (1.6 m)   Wt 72.6 kg   LMP 10/19/2019   SpO2 99%   BMI 28.34 kg/m  Physical Exam  ED Results / Procedures / Treatments   Labs (all labs ordered are listed, but only abnormal results are displayed) Labs Reviewed  COMPREHENSIVE METABOLIC PANEL - Abnormal; Notable for the following components:      Result Value   Potassium 2.9 (*)    Glucose, Bld 131 (*)    Total Protein 9.3 (*)    All other components within normal limits  CBC - Abnormal; Notable for the following components:   WBC 18.8 (*)    All other components within normal limits  URINALYSIS, ROUTINE W REFLEX MICROSCOPIC - Abnormal; Notable for the following components:   APPearance HAZY (*)    Hgb urine dipstick SMALL (*)  Ketones, ur 20 (*)    Protein, ur 30 (*)    All other components within normal limits  LIPASE, BLOOD  PREGNANCY, URINE  RAPID URINE DRUG SCREEN, HOSP PERFORMED    EKG EKG Interpretation  Date/Time:  Saturday October 07 2022 04:14:28 EDT Ventricular Rate:  78 PR Interval:  154 QRS Duration: 85 QT Interval:  418 QTC Calculation: 477 R Axis:   70 Text Interpretation: Sinus rhythm Probable left atrial enlargement Probable left ventricular hypertrophy Confirmed by Molpus, Jenny Reichmann [phone]) on 10/07/2022 4:31:29 AM  Radiology CT ABDOMEN  PELVIS W CONTRAST  Result Date: 10/07/2022 CLINICAL DATA:  Acute abdominal pain, hyper emesis EXAM: CT ABDOMEN AND PELVIS WITH CONTRAST TECHNIQUE: Multidetector CT imaging of the abdomen and pelvis was performed using the standard protocol following bolus administration of intravenous contrast. RADIATION DOSE REDUCTION: This exam was performed according to the departmental dose-optimization program which includes automated exposure control, adjustment of the mA and/or kV according to patient size and/or use of iterative reconstruction technique. CONTRAST:  135mL OMNIPAQUE IOHEXOL 300 MG/ML  SOLN COMPARISON:  07/02/2022 FINDINGS: Lower chest: No acute abnormality. Hepatobiliary: No focal liver abnormality is seen. Status post cholecystectomy. No biliary dilatation. Pancreas: Unremarkable. No pancreatic ductal dilatation or surrounding inflammatory changes. Spleen: Normal in size without focal abnormality. Adrenals/Urinary Tract: Adrenal glands are within normal limits. Kidneys are well visualized bilaterally. No renal calculi or obstructive changes are noted. Bladder is well distended. Stomach/Bowel: The appendix is well visualized and within normal limits. No obstructive or inflammatory changes of the colon are seen. Small bowel and stomach are within normal limits with the exception of a small sliding-type hiatal hernia. Vascular/Lymphatic: No significant vascular findings are present. No enlarged abdominal or pelvic lymph nodes. Reproductive: Status post hysterectomy. No adnexal masses. Other: No abdominal wall hernia or abnormality. No abdominopelvic ascites. Musculoskeletal: No acute or significant osseous findings. IMPRESSION: Postsurgical changes without acute abnormality. Small sliding-type hiatal hernia. Electronically Signed   By: Inez Catalina M.D.   On: 10/07/2022 02:37    Procedures Procedures    Medications Ordered in ED Medications  lactated ringers bolus 1,000 mL (1,000 mLs Intravenous Bolus  10/07/22 0101)  promethazine (PHENERGAN) 12.5 mg in sodium chloride 0.9 % 50 mL IVPB (0 mg Intravenous Stopped 10/07/22 0215)  HYDROmorphone (DILAUDID) injection 1 mg (1 mg Intravenous Given 10/07/22 0101)  iohexol (OMNIPAQUE) 300 MG/ML solution 100 mL (100 mLs Intravenous Contrast Given 10/07/22 0219)  sodium chloride (PF) 0.9 % injection (10 mLs  Given 10/07/22 0312)  potassium chloride SA (KLOR-CON M) CR tablet 40 mEq (40 mEq Oral Given 10/07/22 0305)  potassium chloride 10 mEq in 100 mL IVPB (0 mEq Intravenous Stopped 10/07/22 0551)  metoCLOPramide (REGLAN) injection 10 mg (10 mg Intravenous Given 10/07/22 0317)  diphenhydrAMINE (BENADRYL) injection 25 mg (25 mg Intravenous Given 10/07/22 0317)  HYDROmorphone (DILAUDID) injection 1 mg (1 mg Intravenous Given 10/07/22 [id number])    ED Course/ Medical Decision Making/ A&P                             Medical Decision Making Amount and/or Complexity of Data Reviewed Labs: ordered. Radiology: ordered.  Risk Prescription drug management.   37 year old female presents to the ER with concerns for vomiting and abdominal pain.  She has a history of cyclical vomiting, cannabis hyperemesis.  She presents complaining of abdominal pain.  She was hypertensive on arrival however this is downtrending throughout her ED course.  She has no headache, no vision changes.  She is hypersensitive to touch to her abdomen and the exam is difficult for me to perform as she is moaning and crying.  DDx includes cannabis hyperemesis/cyclical vomiting, appendicitis, diverticulitis, SBO, UTI, pyelonephritis, pregnancy  Labs ordered, reviewed,, hypokalemia of 2.9, leukocytosis of 18.8, however I think this may be reactive due to vomiting.  She is afebrile.  Lipase is normal.  Negative pregnancy.  UA with proteinuria and ketones consistent with dehydration.  For her complaint of abdominal pain and difficult exam, CT of the abdomen pelvis was ordered.  This was overall negative for  acute findings.  The patient was given 2 mg of Dilaudid, LR bolus, Phenergan, Reglan and Benadryl.  She was also given potassium repletion.  On reeval patient, patient endorses feeling much improved.  She is tolerating water and ice without difficulty.  Requesting discharge.  No indication for admission.  Discussed her elevated blood pressure and encouraged recheck.  Suspect this may be incidental due to her retching.  Return precautions discussed.  Stable for discharge. Final Clinical Impression(s) / ED Diagnoses Final diagnoses:  Cyclical vomiting    Rx / DC Orders ED Discharge Orders     None         Garald Balding, PA-C 10/07/22 [id number]    Shanon Rosser, MD 10/07/22 [phone]

## 2022-11-03 ENCOUNTER — Other Ambulatory Visit: Payer: Self-pay

## 2022-11-03 ENCOUNTER — Emergency Department
Admission: EM | Admit: 2022-11-03 | Discharge: 2022-11-03 | Disposition: A | Discharge status: Home / Self Care | Payer: Medicaid Other | Attending: Emergency Medicine | Admitting: Emergency Medicine

## 2022-11-03 ENCOUNTER — Emergency Department: Payer: Medicaid Other

## 2022-11-03 DIAGNOSIS — R519 Headache, unspecified: Secondary | ICD-10-CM | POA: Diagnosis present

## 2022-11-03 DIAGNOSIS — D72829 Elevated white blood cell count, unspecified: Secondary | ICD-10-CM | POA: Diagnosis not present

## 2022-11-03 DIAGNOSIS — R109 Unspecified abdominal pain: Secondary | ICD-10-CM | POA: Insufficient documentation

## 2022-11-03 DIAGNOSIS — R202 Paresthesia of skin: Secondary | ICD-10-CM | POA: Diagnosis not present

## 2022-11-03 DIAGNOSIS — R112 Nausea with vomiting, unspecified: Secondary | ICD-10-CM | POA: Diagnosis not present

## 2022-11-03 LAB — CBC WITH DIFFERENTIAL/PLATELET
Abs Immature Granulocytes: 0.05 10*3/uL (ref 0.00–0.07)
Basophils Absolute: 0.1 10*3/uL (ref 0.0–0.1)
Basophils Relative: 0 %
Eosinophils Absolute: 0.2 10*3/uL (ref 0.0–0.5)
Eosinophils Relative: 2 %
HCT: 41.4 % (ref 36.0–46.0)
Hemoglobin: 13.6 g/dL (ref 12.0–15.0)
Immature Granulocytes: 0 %
Lymphocytes Relative: 14 %
Lymphs Abs: 1.7 10*3/uL (ref 0.7–4.0)
MCH: 31.1 pg (ref 26.0–34.0)
MCHC: 32.9 g/dL (ref 30.0–36.0)
MCV: 94.5 fL (ref 80.0–100.0)
Monocytes Absolute: 0.8 10*3/uL (ref 0.1–1.0)
Monocytes Relative: 6 %
Neutro Abs: 9.3 10*3/uL — ABNORMAL HIGH (ref 1.7–7.7)
Neutrophils Relative %: 78 %
Platelets: 264 10*3/uL (ref 150–400)
RBC: 4.38 MIL/uL (ref 3.87–5.11)
RDW: 12.5 % (ref 11.5–15.5)
WBC: 12 10*3/uL — ABNORMAL HIGH (ref 4.0–10.5)
nRBC: 0 % (ref 0.0–0.2)

## 2022-11-03 LAB — URINALYSIS, ROUTINE W REFLEX MICROSCOPIC
Bilirubin Urine: NEGATIVE
Glucose, UA: NEGATIVE mg/dL
Ketones, ur: NEGATIVE mg/dL
Leukocytes,Ua: NEGATIVE
Nitrite: NEGATIVE
Protein, ur: NEGATIVE mg/dL
Specific Gravity, Urine: 1.011 (ref 1.005–1.030)
pH: 7 (ref 5.0–8.0)

## 2022-11-03 LAB — ETHANOL: Alcohol, Ethyl (B): <10 mg/dL (ref <10)

## 2022-11-03 LAB — URINE DRUG SCREEN, QUALITATIVE (ARMC ONLY)
Amphetamines, Ur Screen: NOT DETECTED
Barbiturates, Ur Screen: NOT DETECTED
Benzodiazepine, Ur Scrn: NOT DETECTED
Cannabinoid 50 Ng, Ur ~~LOC~~: POSITIVE — AB
Cocaine Metabolite,Ur ~~LOC~~: NOT DETECTED
MDMA (Ecstasy)Ur Screen: NOT DETECTED
Methadone Scn, Ur: NOT DETECTED
Opiate, Ur Screen: POSITIVE — AB
Phencyclidine (PCP) Ur S: NOT DETECTED
Tricyclic, Ur Screen: NOT DETECTED

## 2022-11-03 LAB — CSF CELL COUNT WITH DIFFERENTIAL
Eosinophils, CSF: 0 %
Lymphs, CSF: 87 %
Monocyte-Macrophage-Spinal Fluid: 10 %
RBC Count, CSF: 3 /mm3 (ref 0–3)
Segmented Neutrophils-CSF: 3 %
Tube #: 3
WBC, CSF: 5 /mm3 (ref 0–5)

## 2022-11-03 LAB — PROTEIN AND GLUCOSE, CSF
Glucose, CSF: 55 mg/dL (ref 40–70)
Total  Protein, CSF: 26 mg/dL (ref 15–45)

## 2022-11-03 LAB — POC URINE PREG, ED: Preg Test, Ur: NEGATIVE

## 2022-11-03 LAB — BASIC METABOLIC PANEL
Anion gap: 7 (ref 5–15)
BUN: 8 mg/dL (ref 6–20)
CO2: 23 mmol/L (ref 22–32)
Calcium: 8.8 mg/dL — ABNORMAL LOW (ref 8.9–10.3)
Chloride: 105 mmol/L (ref 98–111)
Creatinine, Ser: 0.72 mg/dL (ref 0.44–1.00)
GFR, Estimated: >60 mL/min (ref >60)
Glucose, Bld: 97 mg/dL (ref 70–99)
Potassium: 3.6 mmol/L (ref 3.5–5.1)
Sodium: 135 mmol/L (ref 135–145)

## 2022-11-03 LAB — MAGNESIUM: Magnesium: 1.7 mg/dL (ref 1.7–2.4)

## 2022-11-03 MED ORDER — LIDOCAINE HCL (PF) 1 % IJ SOLN
10.0000 mL | Freq: Once | INTRAMUSCULAR | Status: AC
  Administered 2022-11-03: 5 mL

## 2022-11-03 MED ORDER — TOPIRAMATE 25 MG PO TABS: 25.0000 mg | ORAL_TABLET | Freq: Every day | ORAL | 2 refills | Status: DC

## 2022-11-03 MED ORDER — PROMETHAZINE HCL 25 MG/ML IJ SOLN
12.5000 mg | Freq: Once | INTRAMUSCULAR | Status: AC
  Administered 2022-11-03: 12.5 mg via INTRAMUSCULAR
  Filled 2022-11-03: qty 1

## 2022-11-03 MED ORDER — GADOBUTROL 1 MMOL/ML IV SOLN
7.0000 mL | Freq: Once | INTRAVENOUS | Status: AC | PRN
  Administered 2022-11-03: 7 mL via INTRAVENOUS

## 2022-11-03 MED ORDER — TOPIRAMATE 25 MG PO TABS
25.0000 mg | ORAL_TABLET | Freq: Once | ORAL | Status: AC
  Administered 2022-11-03: 25 mg via ORAL
  Filled 2022-11-03: qty 1

## 2022-11-03 MED ORDER — AMLODIPINE BESYLATE 5 MG PO TABS
2.5000 mg | ORAL_TABLET | Freq: Once | ORAL | Status: AC
  Administered 2022-11-03: 2.5 mg via ORAL
  Filled 2022-11-03: qty 1

## 2022-11-03 MED ORDER — SODIUM CHLORIDE 0.9 % IV BOLUS
500.0000 mL | Freq: Once | INTRAVENOUS | Status: AC
  Administered 2022-11-03: 500 mL via INTRAVENOUS

## 2022-11-03 MED ORDER — HYDROMORPHONE HCL 1 MG/ML IJ SOLN
0.5000 mg | INTRAMUSCULAR | Status: AC
  Administered 2022-11-03: 0.5 mg via INTRAVENOUS
  Filled 2022-11-03: qty 0.5

## 2022-11-03 MED ORDER — HYDROMORPHONE HCL 1 MG/ML IJ SOLN
1.0000 mg | INTRAMUSCULAR | Status: AC
  Administered 2022-11-03: 1 mg via INTRAVENOUS
  Filled 2022-11-03: qty 1

## 2022-11-03 MED ORDER — AMLODIPINE BESYLATE 5 MG PO TABS: 5.0000 mg | ORAL_TABLET | Freq: Once | ORAL | Status: DC

## 2022-11-03 NOTE — Procedures (Signed)
Technically successful fluoro guided LP at L3-L4 level with opening pressure of 25 cm H2O and closing pressure of 10 cm H2O.  20 cc of clear, colorless CSF sent to lab for analysis.  No immediate post procedural complication.  Please see imaging section of Epic for full dictation.    Alex Gardener, AGNP-BC 11/03/2022, 2:32 PM

## 2022-11-03 NOTE — Discharge Instructions (Signed)

## 2022-11-03 NOTE — ED Provider Notes (Signed)
[facility] Provider Note    Event Date/Time   First MD Initiated Contact with Patient 11/03/22 [phone]     (approximate)   History   Headache   HPI  Sherry Key is a 37 y.o. female history of abdominal hysterectomy, Crohn's disease, cannabinoid induced hyperemesis, polycystic ovary  Patient reports she went to bed fine at 10 PM.  She woke up at about 4 AM with the throbbing left-sided headache along with nausea and vomiting.  She also reports that she has been feeling a little bit of a tingly feeling on the left side of her face.  The friend Fannie Knee with whom she was with she asked if they saw anything strange and the way her face was looking, but she reports that they told her that everything looked fine.  Currently reports a moderate left-sided throbbing headache associated with nausea and vomiting.     Physical Exam   Triage Vital Signs: ED Triage Vitals  Enc Vitals Group     BP 11/03/22 0608 (!) 155/119     Pulse Rate 11/03/22 0608 81     Resp 11/03/22 0608 15     Temp 11/03/22 0608 98.5 F (36.9 C)     Temp Source 11/03/22 0608 Oral     SpO2 11/03/22 0608 100 %     Weight 11/03/22 0608 160 lb (72.6 kg)     Height 11/03/22 0608 5\' 3"  (1.6 m)     Head Circumference --      Peak Flow --      Pain Score 11/03/22 0607 10     Pain Loc --      Pain Edu? --      Excl. in GC? --     Most recent vital signs: Vitals:   11/03/22 1345 11/03/22 1440  BP: (!) 151/93 (!) 146/95  Pulse: 65 60  Resp:    Temp:    SpO2: 99% 100%      General: Awake, no distress.  Resting comfortably. CV:  Good peripheral perfusion.  Clear bilaterally, normal heart tones Resp:  Normal effort.  Abd:  No distention.  Soft nontender nondistended throughout.  Other:  Moves all extremities with normal strength.  No pronator drift.  No facial droop.  Patient endorses slight paresthesia over the left face, but no objective sensory loss   ED Results / Procedures /  Treatments   Labs (all labs ordered are listed, but only abnormal results are displayed) Labs Reviewed  CBC WITH DIFFERENTIAL/PLATELET - Abnormal; Notable for the following components:      Result Value   WBC 12.0 (*)    Neutro Abs 9.3 (*)    All other components within normal limits  URINE DRUG SCREEN, QUALITATIVE ([facility] ONLY) - Abnormal; Notable for the following components:   Opiate, Ur Screen POSITIVE (*)    Cannabinoid 50 Ng, Ur [place] POSITIVE (*)    All other components within normal limits  URINALYSIS, ROUTINE W REFLEX MICROSCOPIC - Abnormal; Notable for the following components:   Color, Urine STRAW (*)    APPearance CLEAR (*)    Hgb urine dipstick SMALL (*)    Bacteria, UA RARE (*)    All other components within normal limits  BASIC METABOLIC PANEL - Abnormal; Notable for the following components:   Calcium 8.8 (*)    All other components within normal limits  CSF CULTURE W GRAM STAIN  ETHANOL  MAGNESIUM  CSF CELL COUNT WITH DIFFERENTIAL  PROTEIN  AND GLUCOSE, CSF  POC URINE PREG, ED    MR BRAIN WO CONTRAST  Result Date: 11/03/2022 CLINICAL DATA:  Trauma.  Left-sided numbness.  Headache. EXAM: MRI HEAD WITHOUT CONTRAST MRV HEAD WITH CONTRAST TECHNIQUE: Multiplanar, multi-echo pulse sequences of the brain and surrounding structures were acquired without intravenous contrast. Angiographic images of the intracranial venous structures were acquired using MRV technique with intravenous contrast. CONTRAST:  7mL GADAVIST GADOBUTROL 1 MMOL/ML IV SOLN COMPARISON:  Same day CT FINDINGS: MRI HEAD WITHOUT CONTRAST Brain: Negative for an acute infarct. No hemorrhage. No hydrocephalus. No extra-axial fluid collection. There are a few periventricular and subcortical T2/FLAIR hyperintense lesions, which are nonspecific, but favored to represent sequela of mild chronic microvascular ischemic change. No evidence of abnormal contrast enhancement on the post contrast-enhanced axial SPGR sequence.  Enlarged and partially empty sella. There is narrowing of the sigmoid/transverse sinus junction bilaterally. Vascular: Normal flow voids. Skull and upper cervical spine: Normal marrow signal. Sinuses/Orbits: No middle ear or mastoid effusion. Pansinus mucosal thickening. Orbits are unremarkable. Other: None. MR VENOGRAM WITHOUT CONTRAST No evidence of dural venous sinus thrombosis. IMPRESSION: 1. No acute intracranial process. 2. Enlarged and partially empty sella and narrowing of the sigmoid/transverse sinus junction bilaterally, which can be seen in the setting of idiopathic intracranial hypertension. 3. No evidence of dural venous sinus thrombosis. Electronically Signed   By: Lorenza Cambridge M.D.   On: 11/03/2022 09:06   MR Venogram Head  Result Date: 11/03/2022 CLINICAL DATA:  Trauma.  Left-sided numbness.  Headache. EXAM: MRI HEAD WITHOUT CONTRAST MRV HEAD WITH CONTRAST TECHNIQUE: Multiplanar, multi-echo pulse sequences of the brain and surrounding structures were acquired without intravenous contrast. Angiographic images of the intracranial venous structures were acquired using MRV technique with intravenous contrast. CONTRAST:  7mL GADAVIST GADOBUTROL 1 MMOL/ML IV SOLN COMPARISON:  Same day CT FINDINGS: MRI HEAD WITHOUT CONTRAST Brain: Negative for an acute infarct. No hemorrhage. No hydrocephalus. No extra-axial fluid collection. There are a few periventricular and subcortical T2/FLAIR hyperintense lesions, which are nonspecific, but favored to represent sequela of mild chronic microvascular ischemic change. No evidence of abnormal contrast enhancement on the post contrast-enhanced axial SPGR sequence. Enlarged and partially empty sella. There is narrowing of the sigmoid/transverse sinus junction bilaterally. Vascular: Normal flow voids. Skull and upper cervical spine: Normal marrow signal. Sinuses/Orbits: No middle ear or mastoid effusion. Pansinus mucosal thickening. Orbits are unremarkable. Other: None.  MR VENOGRAM WITHOUT CONTRAST No evidence of dural venous sinus thrombosis. IMPRESSION: 1. No acute intracranial process. 2. Enlarged and partially empty sella and narrowing of the sigmoid/transverse sinus junction bilaterally, which can be seen in the setting of idiopathic intracranial hypertension. 3. No evidence of dural venous sinus thrombosis. Electronically Signed   By: Lorenza Cambridge M.D.   On: 11/03/2022 09:06   CT Head Wo Contrast  Result Date: 11/03/2022 CLINICAL DATA:  Headache with left-sided numbness. EXAM: CT HEAD WITHOUT CONTRAST TECHNIQUE: Contiguous axial images were obtained from the base of the skull through the vertex without intravenous contrast. RADIATION DOSE REDUCTION: This exam was performed according to the departmental dose-optimization program which includes automated exposure control, adjustment of the mA and/or kV according to patient size and/or use of iterative reconstruction technique. COMPARISON:  07/23/2019 FINDINGS: Brain: No evidence of acute infarction, hemorrhage, hydrocephalus, extra-axial collection or mass lesion/mass effect. Vascular: No hyperdense vessel or unexpected calcification. Skull: Normal. Negative for fracture or focal lesion. Sinuses/Orbits: No acute finding. IMPRESSION: Normal head CT. Electronically Signed   By: Christiane Ha  Watts M.D.   On: 11/03/2022 07:07      PROCEDURES:  Critical Care performed: No  Procedures   MEDICATIONS ORDERED IN ED: Medications  topiramate (TOPAMAX) tablet 25 mg (has no administration in time range)  HYDROmorphone (DILAUDID) injection 1 mg (1 mg Intravenous Given 11/03/22 0746)  promethazine (PHENERGAN) injection 12.5 mg (12.5 mg Intramuscular Given 11/03/22 0750)  sodium chloride 0.9 % bolus 500 mL (0 mLs Intravenous Stopped 11/03/22 0910)  gadobutrol (GADAVIST) 1 MMOL/ML injection 7 mL (7 mLs Intravenous Contrast Given 11/03/22 0851)  HYDROmorphone (DILAUDID) injection 0.5 mg (0.5 mg Intravenous Given 11/03/22 1035)   amLODipine (NORVASC) tablet 2.5 mg (2.5 mg Oral Given 11/03/22 1034)  HYDROmorphone (DILAUDID) injection 0.5 mg (0.5 mg Intravenous Given 11/03/22 1346)  lidocaine (PF) (XYLOCAINE) 1 % injection 10 mL (5 mLs Other Given 11/03/22 1416)     IMPRESSION / MDM / ASSESSMENT AND PLAN / ED COURSE  I reviewed the triage vital signs and the nursing notes.                              Differential diagnosis includes, but is not limited to, causation such as migraine headache, venous sinus thrombosis, cluster headache, stroke this seems fairly unlikely, electrolyte abnormality, pseudotumor, etc.  There is no evidence of acute stroke on MRI imaging.  Patient has no fever, no clinical signs or symptoms to suggest meningismus or meningitis.  MRI reveals findings suggestive of possible elevated intracranial pressure.  Discussed case and obtain consult from neurology Dr. Selina Cooley.  She is recommending the patient undergo lumbar puncture to evaluate opening pressure  Patient's pain is improved after receiving multiple doses of hydromorphone.  The patient is understanding and I discussed the recommendation by neurology to undergo lumbar puncture.  I did also discuss with her risks including bleeding, post LP headache, nerve injury, infection, and the need for possible blood patch or further repair of CSF leak encouraged, as well as the benefits including diagnostic testing and possibly diagnosing elevated pressure within her CSF, and alternatives including treatment with acetazolamide further neurology workup.  Patient is agreeable with proceeding.  Have discussed with interventional radiology, and they will proceed with CSF studies including opening pressure.  Ongoing care assigned to Dr. Willy Eddy with plan to follow-up on pending CSF studies and report, and communicate with Dr. Selina Cooley for further guidance on next steps in treatment.  Patient's presentation is most consistent with acute complicated illness /  injury requiring diagnostic workup.   The patient is on the cardiac monitor to evaluate for evidence of arrhythmia and/or significant heart rate changes.   Clinical Course as of 11/03/22 1614  Fri Nov 03, 2022  1012 Consult request placed with Dr. Selina Cooley.  Reevaluated patient, blood pressure has improved, she reports headache is still present but not as severe.  She is resting comfortably.  Fully alert. [MQ]    Clinical Course User Index [MQ] Sharyn Creamer, MD   Labs interpreted as negative urinalysis, normal CBC except for very mild leukocytosis, normal metabolic panel.  ----------------------------------------- 12:30 PM on 11/03/2022 ----------------------------------------- Neurology, Dr. Selina Cooley, advises suspicion for elevated intracranial pressure.  She is recommending the patient undergo lumbar puncture for diagnostic purposes in the event the patient has intracranial hypertension.  I have discussed with the patient, after discussing and inconsideration availability of interventional radiology services, I have requested interventional radiology evaluate the patient for lumbar puncture and CSF drainage, cell counts, etc. As  recommended by Dr. Selina Cooley.  Patient presently alert oriented  FINAL CLINICAL IMPRESSION(S) / ED DIAGNOSES   Final diagnoses:  Bad headache  Acute nonintractable headache, unspecified headache type     Rx / DC Orders   ED Discharge Orders          Ordered    topiramate (TOPAMAX) 25 MG tablet  Daily at bedtime        11/03/22 1604             Note:  This document was prepared using Dragon voice recognition software and may include unintentional dictation errors.   Sharyn Creamer, MD 11/03/22 [phone]

## 2022-11-03 NOTE — ED Provider Notes (Signed)
Received in signout from Dr. Fanny Bien pending follow-up results of LP.  Equivocal pressures.  No sign of infection.  Patient does appear stable and appropriate for outpatient follow-up.  Per neurology recommendations will place on topiramate.  Discussed outpatient follow-up and signs and symptoms for which she should return to the ER.  Patient agreeable plan.   Willy Eddy, MD 11/03/22 [phone]

## 2022-11-03 NOTE — ED Triage Notes (Addendum)
Pt to ED via EMS from home, pt reports going to bed last night at 2200 and woke up this morning at 0400 with a headache and left sided numbness, abd pain and nausea. Pt denies alcohol or drug use. Pt denies taking any blood thinners. Pt talking in complete sentences, speech clear no facial droop. Pt a&o x4

## 2022-11-03 NOTE — ED Notes (Signed)
MD aware of symptoms

## 2022-11-04 LAB — CSF CULTURE W GRAM STAIN: Culture: NO GROWTH

## 2022-11-05 LAB — CSF CULTURE W GRAM STAIN

## 2022-11-06 LAB — CSF CULTURE W GRAM STAIN: Gram Stain: NONE SEEN

## 2022-12-25 ENCOUNTER — Emergency Department (HOSPITAL_COMMUNITY)
Admission: EM | Admit: 2022-12-25 | Discharge: 2022-12-25 | Disposition: A | Discharge status: Home / Self Care | Payer: Medicaid Other | Attending: Emergency Medicine | Admitting: Emergency Medicine

## 2022-12-25 ENCOUNTER — Encounter (HOSPITAL_COMMUNITY): Payer: Self-pay

## 2022-12-25 DIAGNOSIS — E860 Dehydration: Secondary | ICD-10-CM | POA: Insufficient documentation

## 2022-12-25 DIAGNOSIS — R112 Nausea with vomiting, unspecified: Secondary | ICD-10-CM | POA: Diagnosis present

## 2022-12-25 LAB — COMPREHENSIVE METABOLIC PANEL
ALT: 13 U/L (ref 0–44)
AST: 27 U/L (ref 15–41)
Albumin: 4.9 g/dL (ref 3.5–5.0)
Alkaline Phosphatase: 92 U/L (ref 38–126)
Anion gap: 15 (ref 5–15)
BUN: 18 mg/dL (ref 6–20)
CO2: 15 mmol/L — ABNORMAL LOW (ref 22–32)
Calcium: 10 mg/dL (ref 8.9–10.3)
Chloride: 106 mmol/L (ref 98–111)
Creatinine, Ser: 1.05 mg/dL — ABNORMAL HIGH (ref 0.44–1.00)
GFR, Estimated: >60 mL/min (ref >60)
Glucose, Bld: 127 mg/dL — ABNORMAL HIGH (ref 70–99)
Potassium: 4 mmol/L (ref 3.5–5.1)
Sodium: 136 mmol/L (ref 135–145)
Total Bilirubin: 1.5 mg/dL — ABNORMAL HIGH (ref 0.3–1.2)
Total Protein: 8.8 g/dL — ABNORMAL HIGH (ref 6.5–8.1)

## 2022-12-25 LAB — LIPASE, BLOOD: Lipase: 24 U/L (ref 11–51)

## 2022-12-25 MED ORDER — DIPHENHYDRAMINE HCL 50 MG/ML IJ SOLN
25.0000 mg | Freq: Once | INTRAMUSCULAR | Status: AC
  Administered 2022-12-25: 25 mg via INTRAVENOUS
  Filled 2022-12-25: qty 1

## 2022-12-25 MED ORDER — METOCLOPRAMIDE HCL 10 MG PO TABS: 10.0000 mg | ORAL_TABLET | Freq: Four times a day (QID) | ORAL | 0 refills | Status: DC

## 2022-12-25 MED ORDER — DROPERIDOL 2.5 MG/ML IJ SOLN
2.5000 mg | Freq: Once | INTRAMUSCULAR | Status: AC
  Administered 2022-12-25: 2.5 mg via INTRAVENOUS
  Filled 2022-12-25: qty 2

## 2022-12-25 MED ORDER — PROMETHAZINE HCL 25 MG RE SUPP: 25.0000 mg | Freq: Four times a day (QID) | RECTAL | 0 refills | Status: DC | PRN

## 2022-12-25 MED ORDER — SODIUM CHLORIDE 0.9 % IV SOLN: 12.5000 mg | Freq: Four times a day (QID) | INTRAVENOUS | Status: DC | PRN

## 2022-12-25 MED ORDER — LACTATED RINGERS IV BOLUS
1000.0000 mL | Freq: Once | INTRAVENOUS | Status: AC
  Administered 2022-12-25: 1000 mL via INTRAVENOUS

## 2022-12-25 NOTE — ED Notes (Signed)
RN attempted to obtain IV access x2. Unsuccessful, additional staff notified and came to bedside to try.

## 2022-12-25 NOTE — ED Provider Notes (Addendum)
[place] EMERGENCY DEPARTMENT AT [facility] Provider Note   CSN: [id number] Arrival date & time: 12/25/22  1229     History Chief Complaint  Patient presents with   Weakness   Nausea   Emesis    HPI Sherry Key is a 37 y.o. female presenting for chief complaint of recurrent nausea vomiting and weakness. Extensive history of similar.  Recently started on topiramate for headaches.  Also multiple serial UDS is positive for THC. States that she started having abdominal pain nausea vomiting over the past 3 days.  Brought in by EMS from a hotel. Immediately on entering the room, patient states that "I need Dilaudid and Ativan to break this cycle". Chart review reveals many episodes prior.  Multiple CT scans which have been nondiagnostic for small bowel obstruction and established diagnosis of cyclic vomiting.  Patient's recorded medical, surgical, social, medication list and allergies were reviewed in the Snapshot window as part of the initial history.   Review of Systems   Review of Systems  Constitutional:  Negative for chills and fever.  HENT:  Negative for ear pain and sore throat.   Eyes:  Negative for pain and visual disturbance.  Respiratory:  Negative for cough and shortness of breath.   Cardiovascular:  Negative for chest pain and palpitations.  Gastrointestinal:  Positive for nausea and vomiting. Negative for abdominal pain and rectal pain.  Genitourinary:  Negative for dysuria and hematuria.  Musculoskeletal:  Negative for arthralgias and back pain.  Skin:  Negative for color change and rash.  Neurological:  Negative for seizures and syncope.  All other systems reviewed and are negative.   Physical Exam Updated Vital Signs BP (!) 145/100   Pulse 86   Temp 98.4 F (36.9 C)   Resp (!) 21   LMP 10/19/2019   SpO2 100%  Physical Exam Vitals and nursing note reviewed.  Constitutional:      General: She is not in acute distress.    Appearance:  She is well-developed.  HENT:     Head: Normocephalic and atraumatic.  Eyes:     Conjunctiva/sclera: Conjunctivae normal.  Cardiovascular:     Rate and Rhythm: Normal rate and regular rhythm.     Heart sounds: No murmur heard. Pulmonary:     Effort: Pulmonary effort is normal. No respiratory distress.     Breath sounds: Normal breath sounds.  Abdominal:     General: There is no distension.     Palpations: Abdomen is soft.     Tenderness: There is no abdominal tenderness. There is no right CVA tenderness or left CVA tenderness.  Musculoskeletal:        General: No swelling or tenderness. Normal range of motion.     Cervical back: Neck supple.  Skin:    General: Skin is warm and dry.  Neurological:     General: No focal deficit present.     Mental Status: She is alert and oriented to person, place, and time. Mental status is at baseline.     Cranial Nerves: No cranial nerve deficit.      ED Course/ Medical Decision Making/ A&P Clinical Course as of 12/25/22 1630  Mon Dec 25, 2022  1415 N2C [CC]    Clinical Course User Index [CC] Glyn Ade, MD    Procedures Procedures   Medications Ordered in ED Medications  promethazine (PHENERGAN) 12.5 mg in sodium chloride 0.9 % 50 mL IVPB (has no administration in time range)  droperidol (INAPSINE)  2.5 MG/ML injection 2.5 mg (2.5 mg Intravenous Given 12/25/22 1544)  diphenhydrAMINE (BENADRYL) injection 25 mg (25 mg Intravenous Given 12/25/22 1545)  lactated ringers bolus 1,000 mL (1,000 mLs Intravenous New Bag/Given 12/25/22 1546)    Medical Decision Making:    Sherry Key is a 37 y.o. female who presented to the ED today with Nausea vomiting detailed above.     Patient's presentation is complicated by their history of multiple comorbid medical problems.  Patient placed on continuous vitals and telemetry monitoring while in ED which was reviewed periodically.   Complete initial physical exam performed, notably the  patient  was hemodynamically stable no acute distress.      Reviewed and confirmed nursing documentation for past medical history, family history, social history.    Initial Assessment:   With the patient's presentation of recurrent nausea vomiting, most likely diagnosis is cyclic nausea vomiting either drug-related or medication related given new Topamax. Other diagnoses were considered including (but not limited to) appendicitis, cholecystitis, small bowel obstruction. These are considered less likely due to history of present illness and physical exam findings.   This is most consistent with an acute life/limb threatening illness complicated by underlying chronic conditions.  Initial Plan:  Considered CT abdomen pelvis with contrast, however patient has had an extensive number none of which have been diagnostic for acute pathology.  Given consistency of this presentation with prior, will elect for observation at this time with plan for serial reassessment Screening labs including CBC and Metabolic panel to evaluate for infectious or metabolic etiology of disease.  EKG to evaluate for cardiac pathology.   Final Assessment and Plan:   EKG reassuring, CBC clotted but patient has had gross symptomatic improvement on my serial reassessments.  CMP resulted with out acute pathology.  Mild dehydration appreciated but patient has remained treated with LR and is now tolerating p.o. intake after administration of droperidol and IV fluids.  Patient to follow-up with her primary care provider/gastroenterologist in the outpatient setting.  Supportive care ordered.  Clinical Impression:  1. Nausea and vomiting, unspecified vomiting type      Data Unavailable   Final Clinical Impression(s) / ED Diagnoses Final diagnoses:  Nausea and vomiting, unspecified vomiting type    Rx / DC Orders ED Discharge Orders          Ordered    promethazine (PHENERGAN) 25 MG suppository  Every 6 hours PRN         12/25/22 1630    metoCLOPramide (REGLAN) 10 MG tablet  Every 6 hours        12/25/22 1630              Glyn Ade, MD 12/25/22 1630    Glyn Ade, MD 12/25/22 1645

## 2022-12-25 NOTE — ED Triage Notes (Signed)
Pt BIB [facility] from hotel where she lives, patient met EMS in the parking lot reporting x2 days of N/V and weakness and chronic condition that makes her unable to stop throwing up. Pt also allergic to zofran. BP 160/136, HR 120, 98% RA, CBG 145

## 2023-01-16 ENCOUNTER — Encounter (HOSPITAL_COMMUNITY): Payer: Self-pay

## 2023-01-16 ENCOUNTER — Other Ambulatory Visit: Payer: Self-pay

## 2023-01-16 ENCOUNTER — Emergency Department (HOSPITAL_COMMUNITY): Payer: MEDICAID

## 2023-01-16 ENCOUNTER — Inpatient Hospital Stay (HOSPITAL_COMMUNITY)
Admission: EM | Admit: 2023-01-16 | Discharge: 2023-01-20 | DRG: 372 | Disposition: A | Discharge status: Home / Self Care | Payer: MEDICAID | Attending: Internal Medicine | Admitting: Internal Medicine

## 2023-01-16 DIAGNOSIS — R112 Nausea with vomiting, unspecified: Secondary | ICD-10-CM

## 2023-01-16 DIAGNOSIS — R109 Unspecified abdominal pain: Secondary | ICD-10-CM | POA: Diagnosis present

## 2023-01-16 DIAGNOSIS — F1210 Cannabis abuse, uncomplicated: Secondary | ICD-10-CM | POA: Diagnosis present

## 2023-01-16 DIAGNOSIS — D72829 Elevated white blood cell count, unspecified: Secondary | ICD-10-CM | POA: Diagnosis not present

## 2023-01-16 DIAGNOSIS — Z87891 Personal history of nicotine dependence: Secondary | ICD-10-CM

## 2023-01-16 DIAGNOSIS — Z9109 Other allergy status, other than to drugs and biological substances: Secondary | ICD-10-CM

## 2023-01-16 DIAGNOSIS — E872 Acidosis, unspecified: Secondary | ICD-10-CM | POA: Diagnosis present

## 2023-01-16 DIAGNOSIS — Z885 Allergy status to narcotic agent status: Secondary | ICD-10-CM

## 2023-01-16 DIAGNOSIS — Z888 Allergy status to other drugs, medicaments and biological substances status: Secondary | ICD-10-CM

## 2023-01-16 DIAGNOSIS — Z886 Allergy status to analgesic agent status: Secondary | ICD-10-CM

## 2023-01-16 DIAGNOSIS — Z8379 Family history of other diseases of the digestive system: Secondary | ICD-10-CM

## 2023-01-16 DIAGNOSIS — E876 Hypokalemia: Secondary | ICD-10-CM | POA: Diagnosis present

## 2023-01-16 DIAGNOSIS — R1110 Vomiting, unspecified: Secondary | ICD-10-CM | POA: Diagnosis not present

## 2023-01-16 DIAGNOSIS — Z91018 Allergy to other foods: Secondary | ICD-10-CM

## 2023-01-16 DIAGNOSIS — Z90711 Acquired absence of uterus with remaining cervical stump: Secondary | ICD-10-CM

## 2023-01-16 DIAGNOSIS — N809 Endometriosis, unspecified: Secondary | ICD-10-CM | POA: Diagnosis present

## 2023-01-16 DIAGNOSIS — A0472 Enterocolitis due to Clostridium difficile, not specified as recurrent: Principal | ICD-10-CM | POA: Diagnosis present

## 2023-01-16 DIAGNOSIS — K5090 Crohn's disease, unspecified, without complications: Secondary | ICD-10-CM | POA: Diagnosis present

## 2023-01-16 DIAGNOSIS — I10 Essential (primary) hypertension: Secondary | ICD-10-CM | POA: Diagnosis present

## 2023-01-16 DIAGNOSIS — R1115 Cyclical vomiting syndrome unrelated to migraine: Secondary | ICD-10-CM | POA: Diagnosis present

## 2023-01-16 DIAGNOSIS — Z79899 Other long term (current) drug therapy: Secondary | ICD-10-CM

## 2023-01-16 DIAGNOSIS — Z1152 Encounter for screening for COVID-19: Secondary | ICD-10-CM

## 2023-01-16 DIAGNOSIS — G932 Benign intracranial hypertension: Secondary | ICD-10-CM | POA: Diagnosis present

## 2023-01-16 DIAGNOSIS — R1030 Lower abdominal pain, unspecified: Principal | ICD-10-CM

## 2023-01-16 LAB — HCG, QUANTITATIVE, PREGNANCY: hCG, Beta Chain, Quant, S: <1 m[IU]/mL (ref <5)

## 2023-01-16 LAB — COMPREHENSIVE METABOLIC PANEL
ALT: 14 U/L (ref 0–44)
AST: 14 U/L — ABNORMAL LOW (ref 15–41)
Albumin: 4.5 g/dL (ref 3.5–5.0)
Alkaline Phosphatase: 74 U/L (ref 38–126)
Anion gap: 9 (ref 5–15)
BUN: 15 mg/dL (ref 6–20)
CO2: 14 mmol/L — ABNORMAL LOW (ref 22–32)
Calcium: 9.1 mg/dL (ref 8.9–10.3)
Chloride: 117 mmol/L — ABNORMAL HIGH (ref 98–111)
Creatinine, Ser: 0.76 mg/dL (ref 0.44–1.00)
GFR, Estimated: >60 mL/min (ref >60)
Glucose, Bld: 122 mg/dL — ABNORMAL HIGH (ref 70–99)
Potassium: 3.4 mmol/L — ABNORMAL LOW (ref 3.5–5.1)
Sodium: 140 mmol/L (ref 135–145)
Total Bilirubin: 0.5 mg/dL (ref 0.3–1.2)
Total Protein: 8.4 g/dL — ABNORMAL HIGH (ref 6.5–8.1)

## 2023-01-16 LAB — CBC WITH DIFFERENTIAL/PLATELET
Abs Immature Granulocytes: 0.14 10*3/uL — ABNORMAL HIGH (ref 0.00–0.07)
Basophils Absolute: 0.1 10*3/uL (ref 0.0–0.1)
Basophils Relative: 0 %
Eosinophils Absolute: 0 10*3/uL (ref 0.0–0.5)
Eosinophils Relative: 0 %
HCT: 42.6 % (ref 36.0–46.0)
Hemoglobin: 13.6 g/dL (ref 12.0–15.0)
Immature Granulocytes: 1 %
Lymphocytes Relative: 4 %
Lymphs Abs: 0.9 10*3/uL (ref 0.7–4.0)
MCH: 31.2 pg (ref 26.0–34.0)
MCHC: 31.9 g/dL (ref 30.0–36.0)
MCV: 97.7 fL (ref 80.0–100.0)
Monocytes Absolute: 1.2 10*3/uL — ABNORMAL HIGH (ref 0.1–1.0)
Monocytes Relative: 5 %
Neutro Abs: 20.9 10*3/uL — ABNORMAL HIGH (ref 1.7–7.7)
Neutrophils Relative %: 90 %
Platelets: 282 10*3/uL (ref 150–400)
RBC: 4.36 MIL/uL (ref 3.87–5.11)
RDW: 13.7 % (ref 11.5–15.5)
WBC: 23.2 10*3/uL — ABNORMAL HIGH (ref 4.0–10.5)
nRBC: 0 % (ref 0.0–0.2)

## 2023-01-16 LAB — URINALYSIS, ROUTINE W REFLEX MICROSCOPIC
Bacteria, UA: NONE SEEN
Bilirubin Urine: NEGATIVE
Glucose, UA: NEGATIVE mg/dL
Hgb urine dipstick: NEGATIVE
Ketones, ur: NEGATIVE mg/dL
Leukocytes,Ua: NEGATIVE
Nitrite: NEGATIVE
Protein, ur: 30 mg/dL — AB
Specific Gravity, Urine: 1.015 (ref 1.005–1.030)
pH: 6 (ref 5.0–8.0)

## 2023-01-16 LAB — RAPID URINE DRUG SCREEN, HOSP PERFORMED
Amphetamines: NOT DETECTED
Barbiturates: NOT DETECTED
Benzodiazepines: NOT DETECTED
Cocaine: NOT DETECTED
Opiates: POSITIVE — AB
Tetrahydrocannabinol: POSITIVE — AB

## 2023-01-16 LAB — BLOOD GAS, VENOUS
Acid-base deficit: 8.4 mmol/L — ABNORMAL HIGH (ref 0.0–2.0)
Bicarbonate: 17.2 mmol/L — ABNORMAL LOW (ref 20.0–28.0)
O2 Saturation: 83.4 %
Patient temperature: 37
pCO2, Ven: 35 mmHg — ABNORMAL LOW (ref 44–60)
pH, Ven: 7.3 (ref 7.25–7.43)
pO2, Ven: 50 mmHg — ABNORMAL HIGH (ref 32–45)

## 2023-01-16 LAB — RESP PANEL BY RT-PCR (RSV, FLU A&B, COVID)  RVPGX2
Influenza A by PCR: NEGATIVE
Influenza B by PCR: NEGATIVE
Resp Syncytial Virus by PCR: NEGATIVE
SARS Coronavirus 2 by RT PCR: NEGATIVE

## 2023-01-16 LAB — LIPASE, BLOOD: Lipase: 26 U/L (ref 11–51)

## 2023-01-16 LAB — MAGNESIUM: Magnesium: 2.1 mg/dL (ref 1.7–2.4)

## 2023-01-16 MED ORDER — HYDROMORPHONE HCL 1 MG/ML IJ SOLN
0.5000 mg | INTRAMUSCULAR | Status: AC | PRN
  Administered 2023-01-16 – 2023-01-17 (×2): 0.5 mg via INTRAVENOUS
  Filled 2023-01-16: qty 0.5
  Filled 2023-01-16: qty 1

## 2023-01-16 MED ORDER — BOOST / RESOURCE BREEZE PO LIQD CUSTOM
1.0000 | Freq: Three times a day (TID) | ORAL | Status: DC
  Administered 2023-01-18 – 2023-01-19 (×3): 1 via ORAL

## 2023-01-16 MED ORDER — DIPHENHYDRAMINE HCL 50 MG/ML IJ SOLN
25.0000 mg | Freq: Once | INTRAMUSCULAR | Status: AC
  Administered 2023-01-16: 25 mg via INTRAVENOUS
  Filled 2023-01-16: qty 1

## 2023-01-16 MED ORDER — IOHEXOL 300 MG/ML  SOLN
100.0000 mL | Freq: Once | INTRAMUSCULAR | Status: AC | PRN
  Administered 2023-01-16: 100 mL via INTRAVENOUS

## 2023-01-16 MED ORDER — SODIUM CHLORIDE 0.9 % IV SOLN
2.0000 g | Freq: Once | INTRAVENOUS | Status: AC
  Administered 2023-01-16: 2 g via INTRAVENOUS
  Filled 2023-01-16: qty 20

## 2023-01-16 MED ORDER — ACETAMINOPHEN 650 MG RE SUPP: 650.0000 mg | Freq: Four times a day (QID) | RECTAL | Status: DC | PRN

## 2023-01-16 MED ORDER — PROCHLORPERAZINE EDISYLATE 10 MG/2ML IJ SOLN
5.0000 mg | INTRAMUSCULAR | Status: DC | PRN
  Administered 2023-01-17: 5 mg via INTRAVENOUS
  Filled 2023-01-16 (×2): qty 2

## 2023-01-16 MED ORDER — HYDROMORPHONE HCL 1 MG/ML IJ SOLN
1.0000 mg | Freq: Once | INTRAMUSCULAR | Status: AC
  Administered 2023-01-16: 1 mg via INTRAVENOUS
  Filled 2023-01-16: qty 1

## 2023-01-16 MED ORDER — SODIUM CHLORIDE 0.9 % IV SOLN
2.0000 g | INTRAVENOUS | Status: DC
  Administered 2023-01-17: 2 g via INTRAVENOUS
  Filled 2023-01-16: qty 20

## 2023-01-16 MED ORDER — ACETAMINOPHEN 325 MG PO TABS
650.0000 mg | ORAL_TABLET | Freq: Four times a day (QID) | ORAL | Status: DC | PRN
  Filled 2023-01-16: qty 2

## 2023-01-16 MED ORDER — PROCHLORPERAZINE EDISYLATE 10 MG/2ML IJ SOLN
10.0000 mg | INTRAMUSCULAR | Status: DC | PRN
  Administered 2023-01-16: 10 mg via INTRAVENOUS
  Filled 2023-01-16: qty 2

## 2023-01-16 MED ORDER — ENOXAPARIN SODIUM 40 MG/0.4ML IJ SOSY
40.0000 mg | PREFILLED_SYRINGE | INTRAMUSCULAR | Status: DC
  Administered 2023-01-16 – 2023-01-19 (×4): 40 mg via SUBCUTANEOUS
  Filled 2023-01-16 (×4): qty 0.4

## 2023-01-16 MED ORDER — POTASSIUM CHLORIDE IN NACL 20-0.9 MEQ/L-% IV SOLN
INTRAVENOUS | Status: DC
  Filled 2023-01-16 (×3): qty 1000

## 2023-01-16 MED ORDER — METRONIDAZOLE 500 MG/100ML IV SOLN
500.0000 mg | Freq: Once | INTRAVENOUS | Status: AC
  Administered 2023-01-16: 500 mg via INTRAVENOUS
  Filled 2023-01-16: qty 100

## 2023-01-16 MED ORDER — METRONIDAZOLE 500 MG/100ML IV SOLN
500.0000 mg | Freq: Two times a day (BID) | INTRAVENOUS | Status: DC
  Administered 2023-01-17 – 2023-01-18 (×3): 500 mg via INTRAVENOUS
  Filled 2023-01-16 (×4): qty 100

## 2023-01-16 NOTE — H&P (Signed)
History and Physical    JANAYSIA MCLEROY WGN:[id number] DOB: 1986-01-20 DOA: 01/16/2023  PCP: Pcp, No   Patient coming from: Home   Chief Complaint: Abdominal pain, N/V   HPI: Sherry Key is a 37 y.o. female with medical history significant for hypertension, chronic cannabis use, and cyclic vomiting syndrome who presents to the emergency department with abdominal pain, nausea, vomiting, and general malaise.  Patient has frequent episodes of abdominal pain with nausea and vomiting.  She had recovered from 1 of these episodes a week or so ago but then developed recurrent abdominal pain with nausea and nonbloody vomiting this morning.  She reports having a loose stool this morning followed by pain across her upper abdomen, nausea, and recurrent episodes of nonbloody vomiting.  She denies any associated fever, chills, or dysuria.  ED Course: Upon arrival to the ED, patient is found to be afebrile and saturating well on room air with stable blood pressure.  EKG demonstrates sinus rhythm with normal QT interval.  CT of the abdomen and pelvis is notable for mild diffuse wall thickening in the hepatic flexure and transverse colon, as well as slightly more prominent than expected appendix.  Labs are notable for potassium 3.4, serum bicarbonate 14, and WBC 23,200.  Surgery (Dr. Magnus Ivan) was consulted by the ED PA and the patient was treated with Rocephin, Flagyl, Benadryl, Dilaudid, and Compazine.  Review of Systems:  All other systems reviewed and apart from HPI, are negative.  Past Medical History:  Diagnosis Date   Cannabinoid hyperemesis syndrome    Crohn's disease (HCC)    Cyclical vomiting    Endometriosis    Fibroids    History of partial hysterectomy 10/24/2019   Hypertension    PCOS (polycystic ovarian syndrome)     Past Surgical History:  Procedure Laterality Date   ABDOMINAL HYSTERECTOMY     CESAREAN SECTION     CESAREAN SECTION     CHOLECYSTECTOMY     HERNIA REPAIR      TONSILLECTOMY     TUBAL LIGATION     TUBAL LIGATION      Social History:   reports that she has quit smoking. Her smoking use included cigarettes. She smoked an average of .5 packs per day. She has never used smokeless tobacco. She reports current drug use. Drug: Marijuana. She reports that she does not drink alcohol.  Allergies  Allergen Reactions   Dicyclomine Itching   Morphine Itching    Take benadryl with morphine to help with itching    Oxycodone-Acetaminophen Hives, Shortness Of Breath and Itching    Other Reaction(s): Abdominal Pain   Peanut-Containing Drug Products Hives and Itching   Zofran [Ondansetron Hcl] Hives and Shortness Of Breath   Ketorolac Other (See Comments)    Caused spasms of tongue and drooling   Dystonia  Other Reaction(s): Other (See Comments)  Caused spasms of tongue and drooling   Other Itching    Fuzzy fruit   Percocet [Oxycodone-Acetaminophen] Hives and Itching   Tomato Hives and Itching   Zofran Hives and Itching   Haloperidol Rash    History reviewed. No pertinent family history.   Prior to Admission medications   Medication Sig Start Date End Date Taking? Authorizing Provider  acetaminophen (TYLENOL) 500 MG tablet Take 500 mg by mouth every 6 (six) hours as needed for moderate pain or mild pain.    [provider]  Doxylamine-Pyridoxine 10-10 MG TBEC Take 1 tablet by mouth every 8 (eight) hours as  needed. 10/20/21   Long, Arlyss Repress, MD  HYDROcodone-acetaminophen (NORCO/VICODIN) 5-325 MG tablet Take 1 tablet by mouth every 4 (four) hours as needed. 07/02/22   Redwine, Madison A, PA-C  hydrOXYzine (ATARAX) 25 MG tablet Take 1 tablet (25 mg total) by mouth every 6 (six) hours. 07/02/22   Redwine, Madison A, PA-C  ibuprofen (ADVIL) 200 MG tablet Take 200 mg by mouth every 6 (six) hours as needed for moderate pain.    [provider]  metoCLOPramide (REGLAN) 10 MG tablet Take 1 tablet (10 mg total) by mouth every 6 (six)  hours. 12/25/22   Glyn Ade, MD  omeprazole (PRILOSEC) 20 MG capsule Take 1 capsule (20 mg total) by mouth daily. 09/26/22   Arby Barrette, MD  promethazine (PHENERGAN) 12.5 MG tablet Take 1 tablet (12.5 mg total) by mouth every 8 (eight) hours as needed for up to 5 days for nausea or vomiting. 08/13/22 01/16/23  Carroll Sage, PA-C  promethazine (PHENERGAN) 25 MG suppository Place 1 suppository (25 mg total) rectally every 6 (six) hours as needed for nausea or vomiting. 12/25/22   Glyn Ade, MD  topiramate (TOPAMAX) 25 MG tablet Take 1 tablet (25 mg total) by mouth at bedtime. May increase to 50mg  after 7 days 11/03/22 11/03/23  Willy Eddy, MD    Physical Exam: Vitals:   01/16/23 1329 01/16/23 1530 01/16/23 1800 01/16/23 1840  BP: (!) 141/114 (!) 163/119 (!) 127/91   Pulse: (!) 118 (!) 106 (!) 51   Resp: 20 (!) 22 20   Temp: 98.6 F (37 C)   97.8 F (36.6 C)  TempSrc: Oral   Axillary  SpO2: 99% 100% 99%      Constitutional: NAD, no pallor or diaphoresis   Eyes: PERTLA, lids and conjunctivae normal ENMT: Mucous membranes are moist. Posterior pharynx clear of any exudate or lesions.   Neck: supple, no masses  Respiratory: no wheezing, no crackles. No accessory muscle use.  Cardiovascular: S1 & S2 heard, regular rate and rhythm. No extremity edema.  Abdomen: Soft, no rebound pain or guarding. Bowel sounds active.  Musculoskeletal: no clubbing / cyanosis. No joint deformity upper and lower extremities.   Skin: no significant rashes, lesions, ulcers. Warm, dry, well-perfused. Neurologic: CN 2-12 grossly intact. Moving all extremities. Alert and oriented.  Psychiatric: Calm. Cooperative.    Labs and Imaging on Admission: I have personally reviewed following labs and imaging studies  CBC: Recent Labs  Lab 01/16/23 1448  WBC 23.2*  NEUTROABS 20.9*  HGB 13.6  HCT 42.6  MCV 97.7  PLT 282   Basic Metabolic Panel: Recent Labs  Lab 01/16/23 1448  NA 140   K 3.4*  CL 117*  CO2 14*  GLUCOSE 122*  BUN 15  CREATININE 0.76  CALCIUM 9.1  MG 2.1   GFR: CrCl cannot be calculated (Unknown ideal weight.). Liver Function Tests: Recent Labs  Lab 01/16/23 1448  AST 14*  ALT 14  ALKPHOS 74  BILITOT 0.5  PROT 8.4*  ALBUMIN 4.5   Recent Labs  Lab 01/16/23 1448  LIPASE 26   No results for input(s): "AMMONIA" in the last 168 hours. Coagulation Profile: No results for input(s): "INR", "PROTIME" in the last 168 hours. Cardiac Enzymes: No results for input(s): "CKTOTAL", "CKMB", "CKMBINDEX", "TROPONINI" in the last 168 hours. BNP (last 3 results) No results for input(s): "PROBNP" in the last 8760 hours. HbA1C: No results for input(s): "HGBA1C" in the last 72 hours. CBG: No results for input(s): "GLUCAP" in  the last 168 hours. Lipid Profile: No results for input(s): "CHOL", "HDL", "LDLCALC", "TRIG", "CHOLHDL", "LDLDIRECT" in the last 72 hours. Thyroid Function Tests: No results for input(s): "TSH", "T4TOTAL", "FREET4", "T3FREE", "THYROIDAB" in the last 72 hours. Anemia Panel: No results for input(s): "VITAMINB12", "FOLATE", "FERRITIN", "TIBC", "IRON", "RETICCTPCT" in the last 72 hours. Urine analysis:    Component Value Date/Time   COLORURINE YELLOW 01/16/2023 1807   APPEARANCEUR CLEAR 01/16/2023 1807   LABSPEC 1.015 01/16/2023 1807   PHURINE 6.0 01/16/2023 1807   GLUCOSEU NEGATIVE 01/16/2023 1807   HGBUR NEGATIVE 01/16/2023 1807   BILIRUBINUR NEGATIVE 01/16/2023 1807   KETONESUR NEGATIVE 01/16/2023 1807   PROTEINUR 30 (A) 01/16/2023 1807   UROBILINOGEN 0.2 01/18/2015 1324   NITRITE NEGATIVE 01/16/2023 1807   LEUKOCYTESUR NEGATIVE 01/16/2023 1807   Sepsis Labs: @LABRCNTIP (procalcitonin:4,lacticidven:4) ) Recent Results (from the past 240 hour(s))  Resp panel by RT-PCR (RSV, Flu A&B, Covid) Anterior Nasal Swab     Status: None   Collection Time: 01/16/23  3:07 PM   Specimen: Anterior Nasal Swab  Result Value Ref Range  Status   SARS Coronavirus 2 by RT PCR NEGATIVE NEGATIVE Final    Comment: (NOTE) SARS-CoV-2 target nucleic acids are NOT DETECTED.  The SARS-CoV-2 RNA is generally detectable in upper respiratory specimens during the acute phase of infection. The lowest concentration of SARS-CoV-2 viral copies this assay can detect is 138 copies/mL. A negative result does not preclude SARS-Cov-2 infection and should not be used as the sole basis for treatment or other patient management decisions. A negative result may occur with  improper specimen collection/handling, submission of specimen other than nasopharyngeal swab, presence of viral mutation(s) within the areas targeted by this assay, and inadequate number of viral copies(<138 copies/mL). A negative result must be combined with clinical observations, patient history, and epidemiological information. The expected result is Negative.  Fact Sheet for Patients:  BloggerCourse.com  Fact Sheet for Healthcare Providers:  SeriousBroker.it  This test is no t yet approved or cleared by the [place] FDA and  has been authorized for detection and/or diagnosis of SARS-CoV-2 by FDA under an Emergency Use Authorization (EUA). This EUA will remain  in effect (meaning this test can be used) for the duration of the COVID-19 declaration under Section 564(b)(1) of the Act, 21 U.S.C.section 360bbb-3(b)(1), unless the authorization is terminated  or revoked sooner.       Influenza A by PCR NEGATIVE NEGATIVE Final   Influenza B by PCR NEGATIVE NEGATIVE Final    Comment: (NOTE) The Xpert Xpress SARS-CoV-2/FLU/RSV plus assay is intended as an aid in the diagnosis of influenza from Nasopharyngeal swab specimens and should not be used as a sole basis for treatment. Nasal washings and aspirates are unacceptable for Xpert Xpress SARS-CoV-2/FLU/RSV testing.  Fact Sheet for  Patients: BloggerCourse.com  Fact Sheet for Healthcare Providers: SeriousBroker.it  This test is not yet approved or cleared by the [place] FDA and has been authorized for detection and/or diagnosis of SARS-CoV-2 by FDA under an Emergency Use Authorization (EUA). This EUA will remain in effect (meaning this test can be used) for the duration of the COVID-19 declaration under Section 564(b)(1) of the Act, 21 U.S.C. section 360bbb-3(b)(1), unless the authorization is terminated or revoked.     Resp Syncytial Virus by PCR NEGATIVE NEGATIVE Final    Comment: (NOTE) Fact Sheet for Patients: BloggerCourse.com  Fact Sheet for Healthcare Providers: SeriousBroker.it  This test is not yet approved or cleared by the [place]  FDA and has been authorized for detection and/or diagnosis of SARS-CoV-2 by FDA under an Emergency Use Authorization (EUA). This EUA will remain in effect (meaning this test can be used) for the duration of the COVID-19 declaration under Section 564(b)(1) of the Act, 21 U.S.C. section 360bbb-3(b)(1), unless the authorization is terminated or revoked.  Performed at [facility], [address] [place], [place] [address]      Radiological Exams on Admission: CT ABDOMEN PELVIS W CONTRAST  Result Date: 01/16/2023 CLINICAL DATA:  Abdominal pain, vomiting EXAM: CT ABDOMEN AND PELVIS WITH CONTRAST TECHNIQUE: Multidetector CT imaging of the abdomen and pelvis was performed using the standard protocol following bolus administration of intravenous contrast. RADIATION DOSE REDUCTION: This exam was performed according to the departmental dose-optimization program which includes automated exposure control, adjustment of the mA and/or kV according to patient size and/or use of iterative reconstruction technique. CONTRAST:  OMNIPAQUE IOHEXOL 300 MG/ML   SOLN COMPARISON:  10/07/2022 FINDINGS: Lower chest: Visualized lower lung fields are clear. Hepatobiliary: Surgical clips are seen in gallbladder fossa. There is no significant dilation of bile ducts. No focal abnormalities are seen in liver. Pancreas: There is 9 mm low-density structure in the tail of pancreas with no interval change. The lesion has been seen in multiple previous studies dating as far back as 02/20/2011. Spleen: Unremarkable. Adrenals/Urinary Tract: Adrenals are unremarkable. There is no hydronephrosis. There are no renal or ureteral stones. Urinary bladder is unremarkable. Stomach/Bowel: Small hiatal hernia is seen. Stomach is not distended. Small bowel loops are not dilated. The appendix measures up to 8 mm in diameter. There are pockets of air and fluid in the lumen of the appendix. There is no pericecal inflammation. There is mild thickening in hepatic flexure and transverse colon. Scattered diverticula are seen in colon without signs of focal acute diverticulitis. There is mild mucosal enhancement in rectosigmoid. Vascular/Lymphatic: Unremarkable. Reproductive: Uterus is not seen. Other: There is no ascites or pneumoperitoneum. Musculoskeletal: Decrease in height of upper endplate of body of L1 vertebra has not changed. IMPRESSION: There is no evidence of intestinal obstruction or pneumoperitoneum. There is no hydronephrosis. There is mild diffuse wall thickening in the hepatic flexure and transverse colon which may suggest acute on chronic nonspecific inflammation. There is mild diffuse mucosal enhancement in rectosigmoid suggesting possible nonspecific inflammation. There are no loculated pericolic fluid collections. Scattered diverticula seen in the colon without signs of focal acute diverticulitis. Appendix is slightly more prominent than usual measuring up to 8 mm in diameter without significant pericolic stranding. This may be a normal variation or suggest early appendicitis. If there  are continued symptoms in right lower quadrant, short-term follow-up CT in 1-2 days may be considered. Status post cholecystectomy. There is 9 mm low-density lesion in tail of pancreas with no significant interval change. Electronically Signed   By: Ernie Avena M.D.   On: 01/16/2023 17:07    EKG: Independently reviewed. Sinus rhythm.   Assessment/Plan   1. Abdominal pain, N/V  - Similar to prior episodes that she has long hx of s/p extensive w/u including CTs, gastric emptying study, EGD with biopsies, SB follow-through  - Non-specific findings noted on CT including colonic wall-thickening and prominent appendix  - Allow clears as tolerated, continue symptomatic treatment and IVF hydration, monitor volume status and electrolytes, follow-up surgery recommendations, consider GI consultation, encourage marijuana cessation     2. Leukocytosis  - WBC is 23,200 on admission without fever  - There is non-specific  findings on CT abd/pelvis including colonic wall-thickening and prominent appendix  - She was started on Rocephin and Flagyl in ED   - Continue antibiotics continue antibiotics for now, follow-up surgery assessment and recommendations   DVT prophylaxis: Lovenox  Code Status: Full  Level of Care: Level of care: Med-Surg Family Communication: none present  Disposition Plan:  Patient is from: home  Anticipated d/c is to: Home  Anticipated d/c date is: Possibly as early as 7/10 or 01/18/23  Patient currently: Pending tolerance of adequate oral intake, surgery consultation  Consults called: General surgery  Admission status: Observation     Briscoe Deutscher, MD [facility]  01/16/2023, 8:58 PM

## 2023-01-16 NOTE — ED Provider Triage Note (Signed)
Emergency Medicine Provider Triage Evaluation Note  Sherry Key , a 37 y.o. female  was evaluated in triage.  Pt complains of vomiting, left sided abdominal pain, and "spinal cord" pain. No fever or chills at home. Denies pregnancy. No concern for STIs. No IVDU.   Review of Systems  Positive: As above Negative: As above  Physical Exam  BP (!) 141/114 (BP Location: Left Arm)   Pulse (!) 118   Temp 98.6 F (37 C) (Oral)   Resp 20   LMP 10/19/2019   SpO2 99%  Gen:   Awake, uncomfortable leaning forward and vomiting Resp:  Normal effort  MSK:   Moves extremities without difficulty  Other:  Tachycardic    Medical Decision Making  Medically screening exam initiated at 1:59 PM.  Appropriate orders placed.  SYLEENA MCHAN was informed that the remainder of the evaluation will be completed by another provider, this initial triage assessment does not replace that evaluation, and the importance of remaining in the ED until their evaluation is complete.     Arabella Merles, PA-C 01/16/23 1404

## 2023-01-16 NOTE — ED Notes (Signed)
ED TO INPATIENT HANDOFF REPORT  Name/Age/Gender Sherry Key 37 y.o. female  Code Status    Code Status Orders  (From admission, onward)           Start     Ordered   01/16/23 2057  Full code  Continuous       Question:  By:  Answer:  Consent: discussion documented in EHR   01/16/23 2058           Code Status History     This patient has a current code status but no historical code status.       Home/SNF/Other Home  Chief Complaint Abdominal pain with vomiting [R10.9, R11.10]  Level of Care/Admitting Diagnosis ED Disposition     ED Disposition  Admit   Condition  --   Comment  Hospital Area: [facility] [place] [facility] [[id number]]  Level of Care: Med-Surg [16]  May place patient in observation at [facility] or [facility] if equivalent level of care is available:: Yes  Covid Evaluation: Confirmed COVID Negative  Diagnosis: Abdominal pain with vomiting [[id number]]  Admitting Physician: Briscoe Deutscher [[id number]]  Attending Physician: Briscoe Deutscher [[id number]]          Medical History Past Medical History:  Diagnosis Date   Cannabinoid hyperemesis syndrome    Crohn's disease (HCC)    Cyclical vomiting    Endometriosis    Fibroids    History of partial hysterectomy 10/24/2019   Hypertension    PCOS (polycystic ovarian syndrome)     Allergies Allergies  Allergen Reactions   Dicyclomine Itching   Morphine Itching    Take benadryl with morphine to help with itching    Oxycodone-Acetaminophen Hives, Shortness Of Breath and Itching    Abdominal Pain, too   Peanut-Containing Drug Products Hives and Itching   Zofran [Ondansetron Hcl] Hives and Shortness Of Breath   Ketorolac Other (See Comments)    Caused spasms of tongue and drooling & Dystonia   Other Itching    Fuzzy fruit   Percocet [Oxycodone-Acetaminophen] Hives and Itching   Tomato Hives and Itching   Zofran Hives and Itching   Haloperidol Rash    IV  Location/Drains/Wounds Patient Lines/Drains/Airways Status     Active Line/Drains/Airways     Name Placement date Placement time Site Days   Peripheral IV 01/16/23 22 G 2.5" Anterior;Right Forearm 01/16/23  1552  Forearm  less than 1            Labs/Imaging Results for orders placed or performed during the hospital encounter of 01/16/23 (from the past 48 hour(s))  Comprehensive metabolic panel     Status: Abnormal   Collection Time: 01/16/23  2:48 PM  Result Value Ref Range   Sodium 140 135 - 145 mmol/L   Potassium 3.4 (L) 3.5 - 5.1 mmol/L   Chloride 117 (H) 98 - 111 mmol/L   CO2 14 (L) 22 - 32 mmol/L   Glucose, Bld 122 (H) 70 - 99 mg/dL    Comment: Glucose reference range applies only to samples taken after fasting for at least 8 hours.   BUN 15 6 - 20 mg/dL   Creatinine, Ser 9.56 0.44 - 1.00 mg/dL   Calcium 9.1 8.9 - 21.3 mg/dL   Total Protein 8.4 (H) 6.5 - 8.1 g/dL   Albumin 4.5 3.5 - 5.0 g/dL   AST 14 (L) 15 - 41 U/L   ALT 14 0 - 44 U/L   Alkaline Phosphatase 74 38 -  126 U/L   Total Bilirubin 0.5 0.3 - 1.2 mg/dL   GFR, Estimated >40 >98 mL/min    Comment: (NOTE) Calculated using the CKD-EPI Creatinine Equation (2021)    Anion gap 9 5 - 15    Comment: Performed at [facility] [address] [place], [place] [address]  CBC with Differential     Status: Abnormal   Collection Time: 01/16/23  2:48 PM  Result Value Ref Range   WBC 23.2 (H) 4.0 - 10.5 K/uL   RBC 4.36 3.87 - 5.11 MIL/uL   Hemoglobin 13.6 12.0 - 15.0 g/dL   HCT 78.2 95.6 - 21.3 %   MCV 97.7 80.0 - 100.0 fL   MCH 31.2 26.0 - 34.0 pg   MCHC 31.9 30.0 - 36.0 g/dL   RDW 08.6 57.8 - 46.9 %   Platelets 282 150 - 400 K/uL   nRBC 0.0 0.0 - 0.2 %   Neutrophils Relative % 90 %   Neutro Abs 20.9 (H) 1.7 - 7.7 K/uL   Lymphocytes Relative 4 %   Lymphs Abs 0.9 0.7 - 4.0 K/uL   Monocytes Relative 5 %   Monocytes Absolute 1.2 (H) 0.1 - 1.0 K/uL   Eosinophils Relative 0 %   Eosinophils  Absolute 0.0 0.0 - 0.5 K/uL   Basophils Relative 0 %   Basophils Absolute 0.1 0.0 - 0.1 K/uL   Immature Granulocytes 1 %   Abs Immature Granulocytes 0.14 (H) 0.00 - 0.07 K/uL    Comment: Performed at [facility], [address], [place], [place] [address]  Lipase, blood     Status: None   Collection Time: 01/16/23  2:48 PM  Result Value Ref Range   Lipase 26 11 - 51 U/L    Comment: Performed at [facility], [address], [place], [place] [address]  hCG, quantitative, pregnancy     Status: None   Collection Time: 01/16/23  2:48 PM  Result Value Ref Range   hCG, Beta Chain, Quant, S <1 <5 mIU/mL    Comment:          GEST. AGE      CONC.  (mIU/mL)   <=1 WEEK        5 - 50     2 WEEKS       50 - 500     3 WEEKS       100 - 10,000     4 WEEKS     1,000 - 30,000     5 WEEKS     3,500 - 115,000   6-8 WEEKS     12,000 - 270,000    12 WEEKS     15,000 - 220,000        FEMALE AND NON-PREGNANT FEMALE:     LESS THAN 5 mIU/mL Performed at [facility], [address], [place], [place] [address]   Magnesium     Status: None   Collection Time: 01/16/23  2:48 PM  Result Value Ref Range   Magnesium 2.1 1.7 - 2.4 mg/dL    Comment: Performed at [facility], [address] [place], [place], [place] [address]  Resp panel by RT-PCR (RSV, Flu A&B, Covid) Anterior Nasal Swab     Status: None   Collection Time: 01/16/23  3:07 PM   Specimen: Anterior Nasal Swab  Result Value Ref Range   SARS Coronavirus 2 by RT PCR NEGATIVE NEGATIVE    Comment: (NOTE)  SARS-CoV-2 target nucleic acids are NOT DETECTED.  The SARS-CoV-2 RNA is generally detectable in upper respiratory specimens during the acute phase of infection. The lowest concentration of SARS-CoV-2 viral copies this assay can detect is 138 copies/mL. A negative result does not preclude SARS-Cov-2 infection and should not be used as the sole basis for treatment or other  patient management decisions. A negative result may occur with  improper specimen collection/handling, submission of specimen other than nasopharyngeal swab, presence of viral mutation(s) within the areas targeted by this assay, and inadequate number of viral copies(<138 copies/mL). A negative result must be combined with clinical observations, patient history, and epidemiological information. The expected result is Negative.  Fact Sheet for Patients:  BloggerCourse.com  Fact Sheet for Healthcare Providers:  SeriousBroker.it  This test is no t yet approved or cleared by the [place] FDA and  has been authorized for detection and/or diagnosis of SARS-CoV-2 by FDA under an Emergency Use Authorization (EUA). This EUA will remain  in effect (meaning this test can be used) for the duration of the COVID-19 declaration under Section 564(b)(1) of the Act, 21 U.S.C.section 360bbb-3(b)(1), unless the authorization is terminated  or revoked sooner.       Influenza A by PCR NEGATIVE NEGATIVE   Influenza B by PCR NEGATIVE NEGATIVE    Comment: (NOTE) The Xpert Xpress SARS-CoV-2/FLU/RSV plus assay is intended as an aid in the diagnosis of influenza from Nasopharyngeal swab specimens and should not be used as a sole basis for treatment. Nasal washings and aspirates are unacceptable for Xpert Xpress SARS-CoV-2/FLU/RSV testing.  Fact Sheet for Patients: BloggerCourse.com  Fact Sheet for Healthcare Providers: SeriousBroker.it  This test is not yet approved or cleared by the [place] FDA and has been authorized for detection and/or diagnosis of SARS-CoV-2 by FDA under an Emergency Use Authorization (EUA). This EUA will remain in effect (meaning this test can be used) for the duration of the COVID-19 declaration under Section 564(b)(1) of the Act, 21 U.S.C. section 360bbb-3(b)(1), unless  the authorization is terminated or revoked.     Resp Syncytial Virus by PCR NEGATIVE NEGATIVE    Comment: (NOTE) Fact Sheet for Patients: BloggerCourse.com  Fact Sheet for Healthcare Providers: SeriousBroker.it  This test is not yet approved or cleared by the [place] FDA and has been authorized for detection and/or diagnosis of SARS-CoV-2 by FDA under an Emergency Use Authorization (EUA). This EUA will remain in effect (meaning this test can be used) for the duration of the COVID-19 declaration under Section 564(b)(1) of the Act, 21 U.S.C. section 360bbb-3(b)(1), unless the authorization is terminated or revoked.  Performed at [facility], [address], [place], [place] [address]   Blood gas, venous (at [facility])     Status: Abnormal   Collection Time: 01/16/23  5:49 PM  Result Value Ref Range   pH, Ven 7.3 7.25 - 7.43   pCO2, Ven 35 (L) 44 - 60 mmHg   pO2, Ven 50 (H) 32 - 45 mmHg   Bicarbonate 17.2 (L) 20.0 - 28.0 mmol/L   Acid-base deficit 8.4 (H) 0.0 - 2.0 mmol/L   O2 Saturation 83.4 %   Patient temperature 37.0     Comment: Performed at [facility], [address], [place], [place] [address]  Urinalysis, Routine w reflex microscopic -Urine, Clean Catch     Status: Abnormal   Collection Time: 01/16/23  6:07 PM  Result Value Ref Range   Color, Urine YELLOW  YELLOW   APPearance CLEAR CLEAR   Specific Gravity, Urine 1.015 1.005 - 1.030   pH 6.0 5.0 - 8.0   Glucose, UA NEGATIVE NEGATIVE mg/dL   Hgb urine dipstick NEGATIVE NEGATIVE   Bilirubin Urine NEGATIVE NEGATIVE   Ketones, ur NEGATIVE NEGATIVE mg/dL   Protein, ur 30 (A) NEGATIVE mg/dL   Nitrite NEGATIVE NEGATIVE   Leukocytes,Ua NEGATIVE NEGATIVE   RBC / HPF 6-10 0 - 5 RBC/hpf   WBC, UA 0-5 0 - 5 WBC/hpf   Bacteria, UA NONE SEEN NONE SEEN   Squamous Epithelial / HPF 11-20 0 - 5 /HPF   Mucus PRESENT     Comment:  Performed at [facility], [address], [place], [place] [address]  Rapid urine drug screen (hospital performed)     Status: Abnormal   Collection Time: 01/16/23  6:07 PM  Result Value Ref Range   Opiates POSITIVE (A) NONE DETECTED   Cocaine NONE DETECTED NONE DETECTED   Benzodiazepines NONE DETECTED NONE DETECTED   Amphetamines NONE DETECTED NONE DETECTED   Tetrahydrocannabinol POSITIVE (A) NONE DETECTED   Barbiturates NONE DETECTED NONE DETECTED    Comment: (NOTE) DRUG SCREEN FOR MEDICAL PURPOSES ONLY.  IF CONFIRMATION IS NEEDED FOR ANY PURPOSE, NOTIFY LAB WITHIN 5 DAYS.  LOWEST DETECTABLE LIMITS FOR URINE DRUG SCREEN Drug Class                     Cutoff (ng/mL) Amphetamine and metabolites    1000 Barbiturate and metabolites    200 Benzodiazepine                 200 Opiates and metabolites        300 Cocaine and metabolites        300 THC                            50 Performed at [facility], [address], [place], [place] [address]    CT ABDOMEN PELVIS W CONTRAST  Result Date: 01/16/2023 CLINICAL DATA:  Abdominal pain, vomiting EXAM: CT ABDOMEN AND PELVIS WITH CONTRAST TECHNIQUE: Multidetector CT imaging of the abdomen and pelvis was performed using the standard protocol following bolus administration of intravenous contrast. RADIATION DOSE REDUCTION: This exam was performed according to the departmental dose-optimization program which includes automated exposure control, adjustment of the mA and/or kV according to patient size and/or use of iterative reconstruction technique. CONTRAST:  OMNIPAQUE IOHEXOL 300 MG/ML  SOLN COMPARISON:  10/07/2022 FINDINGS: Lower chest: Visualized lower lung fields are clear. Hepatobiliary: Surgical clips are seen in gallbladder fossa. There is no significant dilation of bile ducts. No focal abnormalities are seen in liver. Pancreas: There is 9 mm low-density structure in the tail of pancreas with no  interval change. The lesion has been seen in multiple previous studies dating as far back as 02/20/2011. Spleen: Unremarkable. Adrenals/Urinary Tract: Adrenals are unremarkable. There is no hydronephrosis. There are no renal or ureteral stones. Urinary bladder is unremarkable. Stomach/Bowel: Small hiatal hernia is seen. Stomach is not distended. Small bowel loops are not dilated. The appendix measures up to 8 mm in diameter. There are pockets of air and fluid in the lumen of the appendix. There is no pericecal inflammation. There is mild thickening in hepatic flexure and transverse colon. Scattered diverticula are seen in colon without signs of focal acute diverticulitis. There is mild mucosal enhancement in rectosigmoid. Vascular/Lymphatic: Unremarkable.  Reproductive: Uterus is not seen. Other: There is no ascites or pneumoperitoneum. Musculoskeletal: Decrease in height of upper endplate of body of L1 vertebra has not changed. IMPRESSION: There is no evidence of intestinal obstruction or pneumoperitoneum. There is no hydronephrosis. There is mild diffuse wall thickening in the hepatic flexure and transverse colon which may suggest acute on chronic nonspecific inflammation. There is mild diffuse mucosal enhancement in rectosigmoid suggesting possible nonspecific inflammation. There are no loculated pericolic fluid collections. Scattered diverticula seen in the colon without signs of focal acute diverticulitis. Appendix is slightly more prominent than usual measuring up to 8 mm in diameter without significant pericolic stranding. This may be a normal variation or suggest early appendicitis. If there are continued symptoms in right lower quadrant, short-term follow-up CT in 1-2 days may be considered. Status post cholecystectomy. There is 9 mm low-density lesion in tail of pancreas with no significant interval change. Electronically Signed   By: Ernie Avena M.D.   On: 01/16/2023 17:07    Pending  Labs Unresulted Labs (From admission, onward)     Start     Ordered   01/23/23 0500  Creatinine, serum  (enoxaparin (LOVENOX)    CrCl >/= 30 ml/min)  Weekly,   R     Comments: while on enoxaparin therapy    01/16/23 2058   01/17/23 0500  HIV Antibody (routine testing w rflx)  (HIV Antibody (Routine testing w reflex) panel)  Tomorrow morning,   R        01/16/23 2058   01/17/23 0500  Basic metabolic panel  Daily,   R      01/16/23 2058   01/17/23 0500  Magnesium  Tomorrow morning,   R        01/16/23 2058   01/17/23 0500  CBC with Differential/Platelet  Daily,   R      01/16/23 2058            Vitals/Pain Today's Vitals   01/16/23 1550 01/16/23 1752 01/16/23 1800 01/16/23 1840  BP:   (!) 127/91   Pulse:   (!) 51   Resp:   20   Temp:    97.8 F (36.6 C)  TempSrc:    Axillary  SpO2:   99%   PainSc: 10-Worst pain ever Asleep      Isolation Precautions No active isolations  Medications Medications  cefTRIAXone (ROCEPHIN) 2 g in sodium chloride 0.9 % 100 mL IVPB (has no administration in time range)    And  metroNIDAZOLE (FLAGYL) IVPB 500 mg (has no administration in time range)  prochlorperazine (COMPAZINE) injection 5 mg (has no administration in time range)  cefTRIAXone (ROCEPHIN) 2 g in sodium chloride 0.9 % 100 mL IVPB (has no administration in time range)  metroNIDAZOLE (FLAGYL) IVPB 500 mg (has no administration in time range)  enoxaparin (LOVENOX) injection 40 mg (has no administration in time range)  acetaminophen (TYLENOL) tablet 650 mg (has no administration in time range)    Or  acetaminophen (TYLENOL) suppository 650 mg (has no administration in time range)  HYDROmorphone (DILAUDID) injection 0.5 mg (has no administration in time range)  0.9 % NaCl with KCl 20 mEq/ L  infusion (has no administration in time range)  HYDROmorphone (DILAUDID) injection 1 mg (1 mg Intravenous Given 01/16/23 1553)  diphenhydrAMINE (BENADRYL) injection 25 mg (25 mg Intravenous  Given 01/16/23 1553)  iohexol (OMNIPAQUE) 300 MG/ML solution 100 mL (100 mLs Intravenous Contrast Given 01/16/23 1625)    Mobility walks with  person assist

## 2023-01-16 NOTE — ED Provider Notes (Signed)
[place] EMERGENCY DEPARTMENT AT [facility] Provider Note   CSN: [id number] Arrival date & time: 01/16/23  1317     History {Add pertinent medical, surgical, social history, OB history to HPI:1} Chief Complaint  Patient presents with   Emesis    Sherry Key is a 37 y.o. female.  With a past medical history of cannabinoid hyperemesis syndrome, endometriosis, cyclical vomiting, hypertension, PCOS presents today for evaluation of vomiting.  Patient reports nausea and vomiting started this morning.  States she has pain in the left side of her abdomen.  She also complains of pain in her neck.  She denies any recent fall or neck injury.  History of hysterectomy, cholecystectomy and hernia repair.  Denies any fever.  Denies any drug use.  She denies any arm pain, weakness or numbness.  She denies any chest pain or shortness of breath, urinary symptoms, blood in her stool or urine.   Emesis     Past Medical History:  Diagnosis Date   Cannabinoid hyperemesis syndrome    Crohn's disease (HCC)    Cyclical vomiting    Endometriosis    Fibroids    History of partial hysterectomy 10/24/2019   Hypertension    PCOS (polycystic ovarian syndrome)    Past Surgical History:  Procedure Laterality Date   ABDOMINAL HYSTERECTOMY     CESAREAN SECTION     CESAREAN SECTION     CHOLECYSTECTOMY     HERNIA REPAIR     TONSILLECTOMY     TUBAL LIGATION     TUBAL LIGATION       Home Medications Prior to Admission medications   Medication Sig Start Date End Date Taking? Authorizing Provider  acetaminophen (TYLENOL) 500 MG tablet Take 500 mg by mouth every 6 (six) hours as needed for moderate pain or mild pain.   Yes [provider]  acetaZOLAMIDE (DIAMOX) 250 MG tablet Take 500 mg by mouth at bedtime.   Yes [provider]  diphenhydrAMINE (BENADRYL) 25 MG tablet Take 25 mg by mouth every 6 (six) hours as needed for allergies.   Yes [provider]   ibuprofen (ADVIL) 200 MG tablet Take 200 mg by mouth every 6 (six) hours as needed for moderate pain.   Yes [provider]  topiramate (TOPAMAX) 25 MG tablet Take 1 tablet (25 mg total) by mouth at bedtime. May increase to 50mg  after 7 days 11/03/22 11/03/23 Yes Willy Eddy, MD  Doxylamine-Pyridoxine 10-10 MG TBEC Take 1 tablet by mouth every 8 (eight) hours as needed. Patient not taking: Reported on 01/16/2023 10/20/21   Long, Arlyss Repress, MD  HYDROcodone-acetaminophen (NORCO/VICODIN) 5-325 MG tablet Take 1 tablet by mouth every 4 (four) hours as needed. Patient not taking: Reported on 01/16/2023 07/02/22   Redwine, Madison A, PA-C  hydrOXYzine (ATARAX) 25 MG tablet Take 1 tablet (25 mg total) by mouth every 6 (six) hours. Patient not taking: Reported on 01/16/2023 07/02/22   Redwine, Madison A, PA-C  metoCLOPramide (REGLAN) 10 MG tablet Take 1 tablet (10 mg total) by mouth every 6 (six) hours. Patient not taking: Reported on 01/16/2023 12/25/22   Glyn Ade, MD  omeprazole (PRILOSEC) 20 MG capsule Take 1 capsule (20 mg total) by mouth daily. Patient not taking: Reported on 01/16/2023 09/26/22   Arby Barrette, MD  promethazine (PHENERGAN) 12.5 MG tablet Take 1 tablet (12.5 mg total) by mouth every 8 (eight) hours as needed for up to 5 days for nausea or vomiting. Patient not taking:  Reported on 01/16/2023 08/13/22 01/16/23  Carroll Sage, PA-C  promethazine (PHENERGAN) 25 MG suppository Place 1 suppository (25 mg total) rectally every 6 (six) hours as needed for nausea or vomiting. Patient not taking: Reported on 01/16/2023 12/25/22   Glyn Ade, MD      Allergies    Dicyclomine, Morphine, Oxycodone-acetaminophen, Peanut-containing drug products, Zofran [ondansetron hcl], Ketorolac, Other, Percocet [oxycodone-acetaminophen], Tomato, Zofran, and Haloperidol    Review of Systems   Review of Systems  Gastrointestinal:  Positive for vomiting.    Physical Exam Updated Vital  Signs BP (!) 150/78 (BP Location: Left Arm)   Pulse 78   Temp 97.8 F (36.6 C) (Oral)   Resp 19   Ht 5' 3.39" (1.61 m)   Wt 68 kg   LMP 10/19/2019   SpO2 100%   BMI 26.23 kg/m  Physical Exam Vitals and nursing note reviewed.  Constitutional:      Appearance: Normal appearance.  HENT:     Head: Normocephalic and atraumatic.     Mouth/Throat:     Mouth: Mucous membranes are moist.  Eyes:     General: No scleral icterus. Cardiovascular:     Rate and Rhythm: Normal rate and regular rhythm.     Pulses: Normal pulses.     Heart sounds: Normal heart sounds.  Pulmonary:     Effort: Pulmonary effort is normal.     Breath sounds: Normal breath sounds.  Abdominal:     General: Abdomen is flat.     Palpations: Abdomen is soft.     Tenderness: There is abdominal tenderness in the right lower quadrant, left upper quadrant and left lower quadrant.  Musculoskeletal:        General: No deformity.  Skin:    General: Skin is warm.     Findings: No rash.  Neurological:     General: No focal deficit present.     Mental Status: She is alert.  Psychiatric:        Mood and Affect: Mood normal.     ED Results / Procedures / Treatments   Labs (all labs ordered are listed, but only abnormal results are displayed) Labs Reviewed  COMPREHENSIVE METABOLIC PANEL - Abnormal; Notable for the following components:      Result Value   Potassium 3.4 (*)    Chloride 117 (*)    CO2 14 (*)    Glucose, Bld 122 (*)    Total Protein 8.4 (*)    AST 14 (*)    All other components within normal limits  CBC WITH DIFFERENTIAL/PLATELET - Abnormal; Notable for the following components:   WBC 23.2 (*)    Neutro Abs 20.9 (*)    Monocytes Absolute 1.2 (*)    Abs Immature Granulocytes 0.14 (*)    All other components within normal limits  URINALYSIS, ROUTINE W REFLEX MICROSCOPIC - Abnormal; Notable for the following components:   Protein, ur 30 (*)    All other components within normal limits  RAPID  URINE DRUG SCREEN, HOSP PERFORMED - Abnormal; Notable for the following components:   Opiates POSITIVE (*)    Tetrahydrocannabinol POSITIVE (*)    All other components within normal limits  BLOOD GAS, VENOUS - Abnormal; Notable for the following components:   pCO2, Ven 35 (*)    pO2, Ven 50 (*)    Bicarbonate 17.2 (*)    Acid-base deficit 8.4 (*)    All other components within normal limits  RESP PANEL BY RT-PCR (RSV, FLU  A&B, COVID)  RVPGX2  LIPASE, BLOOD  HCG, QUANTITATIVE, PREGNANCY  MAGNESIUM  HIV ANTIBODY (ROUTINE TESTING W REFLEX)  BASIC METABOLIC PANEL  MAGNESIUM  CBC WITH DIFFERENTIAL/PLATELET    EKG EKG Interpretation Date/Time:  Tuesday January 16 2023 17:45:09 EDT Ventricular Rate:  58 PR Interval:  170 QRS Duration:  85 QT Interval:  452 QTC Calculation: 444 R Axis:   77  Text Interpretation: Sinus rhythm Consider left ventricular hypertrophy No acute changes No significant change since last tracing Confirmed by Derwood Kaplan [phone]) on 01/16/2023 5:49:00 PM  Radiology CT ABDOMEN PELVIS W CONTRAST  Result Date: 01/16/2023 CLINICAL DATA:  Abdominal pain, vomiting EXAM: CT ABDOMEN AND PELVIS WITH CONTRAST TECHNIQUE: Multidetector CT imaging of the abdomen and pelvis was performed using the standard protocol following bolus administration of intravenous contrast. RADIATION DOSE REDUCTION: This exam was performed according to the departmental dose-optimization program which includes automated exposure control, adjustment of the mA and/or kV according to patient size and/or use of iterative reconstruction technique. CONTRAST:  OMNIPAQUE IOHEXOL 300 MG/ML  SOLN COMPARISON:  10/07/2022 FINDINGS: Lower chest: Visualized lower lung fields are clear. Hepatobiliary: Surgical clips are seen in gallbladder fossa. There is no significant dilation of bile ducts. No focal abnormalities are seen in liver. Pancreas: There is 9 mm low-density structure in the tail of pancreas with no  interval change. The lesion has been seen in multiple previous studies dating as far back as 02/20/2011. Spleen: Unremarkable. Adrenals/Urinary Tract: Adrenals are unremarkable. There is no hydronephrosis. There are no renal or ureteral stones. Urinary bladder is unremarkable. Stomach/Bowel: Small hiatal hernia is seen. Stomach is not distended. Small bowel loops are not dilated. The appendix measures up to 8 mm in diameter. There are pockets of air and fluid in the lumen of the appendix. There is no pericecal inflammation. There is mild thickening in hepatic flexure and transverse colon. Scattered diverticula are seen in colon without signs of focal acute diverticulitis. There is mild mucosal enhancement in rectosigmoid. Vascular/Lymphatic: Unremarkable. Reproductive: Uterus is not seen. Other: There is no ascites or pneumoperitoneum. Musculoskeletal: Decrease in height of upper endplate of body of L1 vertebra has not changed. IMPRESSION: There is no evidence of intestinal obstruction or pneumoperitoneum. There is no hydronephrosis. There is mild diffuse wall thickening in the hepatic flexure and transverse colon which may suggest acute on chronic nonspecific inflammation. There is mild diffuse mucosal enhancement in rectosigmoid suggesting possible nonspecific inflammation. There are no loculated pericolic fluid collections. Scattered diverticula seen in the colon without signs of focal acute diverticulitis. Appendix is slightly more prominent than usual measuring up to 8 mm in diameter without significant pericolic stranding. This may be a normal variation or suggest early appendicitis. If there are continued symptoms in right lower quadrant, short-term follow-up CT in 1-2 days may be considered. Status post cholecystectomy. There is 9 mm low-density lesion in tail of pancreas with no significant interval change. Electronically Signed   By: Ernie Avena M.D.   On: 01/16/2023 17:07     Procedures Procedures  {Document cardiac monitor, telemetry assessment procedure when appropriate:1}  Medications Ordered in ED Medications  prochlorperazine (COMPAZINE) injection 5 mg (has no administration in time range)  cefTRIAXone (ROCEPHIN) 2 g in sodium chloride 0.9 % 100 mL IVPB (has no administration in time range)  metroNIDAZOLE (FLAGYL) IVPB 500 mg (has no administration in time range)  enoxaparin (LOVENOX) injection 40 mg (40 mg Subcutaneous Given 01/16/23 2241)  acetaminophen (TYLENOL) tablet 650 mg (has  no administration in time range)    Or  acetaminophen (TYLENOL) suppository 650 mg (has no administration in time range)  HYDROmorphone (DILAUDID) injection 0.5 mg (0.5 mg Intravenous Given 01/16/23 2132)  0.9 % NaCl with KCl 20 mEq/ L  infusion (has no administration in time range)  feeding supplement (BOOST / RESOURCE BREEZE) liquid 1 Container (has no administration in time range)  HYDROmorphone (DILAUDID) injection 1 mg (1 mg Intravenous Given 01/16/23 1553)  diphenhydrAMINE (BENADRYL) injection 25 mg (25 mg Intravenous Given 01/16/23 1553)  iohexol (OMNIPAQUE) 300 MG/ML solution 100 mL (100 mLs Intravenous Contrast Given 01/16/23 1625)  cefTRIAXone (ROCEPHIN) 2 g in sodium chloride 0.9 % 100 mL IVPB (2 g Intravenous New Bag/Given 01/16/23 2113)    And  metroNIDAZOLE (FLAGYL) IVPB 500 mg (500 mg Intravenous New Bag/Given 01/16/23 2240)    ED Course/ Medical Decision Making/ A&P   {   Click here for ABCD2, HEART and other calculatorsREFRESH Note before signing :1}                          Medical Decision Making Amount and/or Complexity of Data Reviewed Labs: ordered. Radiology: ordered.  Risk Prescription drug management. Decision regarding hospitalization.   This patient presents to the ED for abdominal pain, nausea, vomiting, this involves an extensive number of treatment options, and is a complaint that carries with a high risk of complications and morbidity.  The  differential diagnosis includes small bowel obstruction, acute gastroenteritis, appendicitis, gallbladder/biliary, pancreatitis, peptic ulcer disease, perforation,vertigo ACS/MI DKA, EtOH intoxication, cannabinoid hyperemesis.  This is not an exhaustive list.  Lab tests: I ordered and personally interpreted labs.  The pertinent results include: WBC unremarkable. Hbg unremarkable. Platelets unremarkable. Electrolytes unremarkable. BUN, creatinine unremarkable.  UDS positive for opiates and THC.  Imaging studies: I ordered imaging studies, personally reviewed, interpreted imaging and agree with the radiologist's interpretations. The results include:  Ct abdomen pelvis showed There is no evidence of intestinal obstruction or pneumoperitoneum. There is no hydronephrosis. There is mild diffuse wall thickening in the hepatic flexure and transverse colon which may suggest acute on chronic nonspecific inflammation. There is mild diffuse mucosal enhancement in rectosigmoid suggesting possible nonspecific inflammation. There are no loculated pericolic fluid collections. Scattered diverticula seen in the colon without signs of focal acute diverticulitis. Appendix is slightly more prominent than usual measuring up to 8 mm in diameter without significant pericolic stranding. This may be a normal variation or suggest early appendicitis.   Problem list/ ED course/ Critical interventions/ Medical management: HPI: See above Vital signs within normal range and stable throughout visit. Laboratory/imaging studies significant for: See above. On physical examination, patient is afebrile and appears in no acute distress. *** I have reviewed the patient home medicines and have made adjustments as needed.  Cardiac monitoring/EKG: The patient was maintained on a cardiac monitor.  I personally reviewed and interpreted the cardiac monitor which showed an underlying rhythm of: sinus rhythm.  Additional history  obtained: External records from outside source obtained and reviewed including: Chart review including previous notes, labs, imaging.  Consultations obtained: I requested consultation with Dr. Magnus Ivan general surgery, discussed labs and imaging and pending plan. He does not think this is appendicitis, however recommended admission to medicine and general surgery will follow up in the morning.  I requested consultation with Dr. Antionette Char [facility] hospitalist, discussed labs and imaging send pending plans.  He agreed to admit the patient. Disposition Admit.  This chart  was dictated using voice recognition software.  Despite best efforts to proofread,  errors can occur which can change the documentation meaning.    {Document critical care time when appropriate:1} {Document review of labs and clinical decision tools ie heart score, Chads2Vasc2 etc:1}  {Document your independent review of radiology images, and any outside records:1} {Document your discussion with family members, caretakers, and with consultants:1} {Document social determinants of health affecting pt's care:1} {Document your decision making why or why not admission, treatments were needed:1} Final Clinical Impression(s) / ED Diagnoses Final diagnoses:  None    Rx / DC Orders ED Discharge Orders     None

## 2023-01-16 NOTE — ED Triage Notes (Signed)
Pt arrived via POV, c/o vomiting, diarrhea. Generally feeling unwell. States pain down her "spinal cord" starting in her neck. Aox4, able to answer questions

## 2023-01-17 ENCOUNTER — Encounter (HOSPITAL_COMMUNITY): Payer: Self-pay | Admitting: Family Medicine

## 2023-01-17 DIAGNOSIS — R109 Unspecified abdominal pain: Secondary | ICD-10-CM

## 2023-01-17 DIAGNOSIS — R1110 Vomiting, unspecified: Secondary | ICD-10-CM

## 2023-01-17 DIAGNOSIS — Z886 Allergy status to analgesic agent status: Secondary | ICD-10-CM | POA: Diagnosis not present

## 2023-01-17 DIAGNOSIS — Z888 Allergy status to other drugs, medicaments and biological substances status: Secondary | ICD-10-CM | POA: Diagnosis not present

## 2023-01-17 DIAGNOSIS — G932 Benign intracranial hypertension: Secondary | ICD-10-CM | POA: Diagnosis present

## 2023-01-17 DIAGNOSIS — I10 Essential (primary) hypertension: Secondary | ICD-10-CM | POA: Diagnosis present

## 2023-01-17 DIAGNOSIS — E876 Hypokalemia: Secondary | ICD-10-CM | POA: Diagnosis present

## 2023-01-17 DIAGNOSIS — Z87891 Personal history of nicotine dependence: Secondary | ICD-10-CM | POA: Diagnosis not present

## 2023-01-17 DIAGNOSIS — A0472 Enterocolitis due to Clostridium difficile, not specified as recurrent: Secondary | ICD-10-CM | POA: Diagnosis not present

## 2023-01-17 DIAGNOSIS — Z1152 Encounter for screening for COVID-19: Secondary | ICD-10-CM | POA: Diagnosis not present

## 2023-01-17 DIAGNOSIS — F1210 Cannabis abuse, uncomplicated: Secondary | ICD-10-CM | POA: Diagnosis present

## 2023-01-17 DIAGNOSIS — Z90711 Acquired absence of uterus with remaining cervical stump: Secondary | ICD-10-CM | POA: Diagnosis not present

## 2023-01-17 DIAGNOSIS — Z885 Allergy status to narcotic agent status: Secondary | ICD-10-CM | POA: Diagnosis not present

## 2023-01-17 DIAGNOSIS — R1115 Cyclical vomiting syndrome unrelated to migraine: Secondary | ICD-10-CM | POA: Diagnosis present

## 2023-01-17 DIAGNOSIS — Z91018 Allergy to other foods: Secondary | ICD-10-CM | POA: Diagnosis not present

## 2023-01-17 DIAGNOSIS — Z8379 Family history of other diseases of the digestive system: Secondary | ICD-10-CM | POA: Diagnosis not present

## 2023-01-17 DIAGNOSIS — E872 Acidosis, unspecified: Secondary | ICD-10-CM | POA: Diagnosis present

## 2023-01-17 DIAGNOSIS — K5090 Crohn's disease, unspecified, without complications: Secondary | ICD-10-CM | POA: Diagnosis present

## 2023-01-17 DIAGNOSIS — N809 Endometriosis, unspecified: Secondary | ICD-10-CM | POA: Diagnosis present

## 2023-01-17 DIAGNOSIS — Z9109 Other allergy status, other than to drugs and biological substances: Secondary | ICD-10-CM | POA: Diagnosis not present

## 2023-01-17 DIAGNOSIS — Z79899 Other long term (current) drug therapy: Secondary | ICD-10-CM | POA: Diagnosis not present

## 2023-01-17 LAB — BASIC METABOLIC PANEL
Anion gap: 7 (ref 5–15)
Anion gap: 10 (ref 5–15)
BUN: 10 mg/dL (ref 6–20)
BUN: 8 mg/dL (ref 6–20)
CO2: 14 mmol/L — ABNORMAL LOW (ref 22–32)
CO2: 14 mmol/L — ABNORMAL LOW (ref 22–32)
Calcium: 8.2 mg/dL — ABNORMAL LOW (ref 8.9–10.3)
Calcium: 8.4 mg/dL — ABNORMAL LOW (ref 8.9–10.3)
Chloride: 112 mmol/L — ABNORMAL HIGH (ref 98–111)
Chloride: 113 mmol/L — ABNORMAL HIGH (ref 98–111)
Creatinine, Ser: 0.67 mg/dL (ref 0.44–1.00)
Creatinine, Ser: 0.78 mg/dL (ref 0.44–1.00)
GFR, Estimated: >60 mL/min (ref >60)
GFR, Estimated: >60 mL/min (ref >60)
Glucose, Bld: 86 mg/dL (ref 70–99)
Glucose, Bld: 97 mg/dL (ref 70–99)
Potassium: 3.3 mmol/L — ABNORMAL LOW (ref 3.5–5.1)
Potassium: 3.6 mmol/L (ref 3.5–5.1)
Sodium: 133 mmol/L — ABNORMAL LOW (ref 135–145)
Sodium: 137 mmol/L (ref 135–145)

## 2023-01-17 LAB — CBC WITH DIFFERENTIAL/PLATELET
Abs Immature Granulocytes: 0.07 10*3/uL (ref 0.00–0.07)
Basophils Absolute: 0 10*3/uL (ref 0.0–0.1)
Basophils Relative: 0 %
Eosinophils Absolute: 0.1 10*3/uL (ref 0.0–0.5)
Eosinophils Relative: 1 %
HCT: 36.8 % (ref 36.0–46.0)
Hemoglobin: 11.8 g/dL — ABNORMAL LOW (ref 12.0–15.0)
Immature Granulocytes: 1 %
Lymphocytes Relative: 15 %
Lymphs Abs: 1.8 10*3/uL (ref 0.7–4.0)
MCH: 31.4 pg (ref 26.0–34.0)
MCHC: 32.1 g/dL (ref 30.0–36.0)
MCV: 97.9 fL (ref 80.0–100.0)
Monocytes Absolute: 0.6 10*3/uL (ref 0.1–1.0)
Monocytes Relative: 5 %
Neutro Abs: 9.4 10*3/uL — ABNORMAL HIGH (ref 1.7–7.7)
Neutrophils Relative %: 78 %
Platelets: 254 10*3/uL (ref 150–400)
RBC: 3.76 MIL/uL — ABNORMAL LOW (ref 3.87–5.11)
RDW: 13.6 % (ref 11.5–15.5)
WBC: 12.1 10*3/uL — ABNORMAL HIGH (ref 4.0–10.5)
nRBC: 0 % (ref 0.0–0.2)

## 2023-01-17 LAB — HIV ANTIBODY (ROUTINE TESTING W REFLEX): HIV Screen 4th Generation wRfx: NONREACTIVE

## 2023-01-17 LAB — C DIFFICILE QUICK SCREEN W PCR REFLEX
C Diff antigen: POSITIVE — AB
C Diff toxin: NEGATIVE

## 2023-01-17 LAB — MAGNESIUM: Magnesium: 2 mg/dL (ref 1.7–2.4)

## 2023-01-17 LAB — CLOSTRIDIUM DIFFICILE BY PCR, REFLEXED: Toxigenic C. Difficile by PCR: POSITIVE — AB

## 2023-01-17 MED ORDER — DIPHENHYDRAMINE HCL 50 MG/ML IJ SOLN
25.0000 mg | Freq: Once | INTRAMUSCULAR | Status: AC
  Administered 2023-01-17: 25 mg via INTRAVENOUS
  Filled 2023-01-17: qty 1

## 2023-01-17 MED ORDER — TOPIRAMATE 25 MG PO TABS: 25.0000 mg | ORAL_TABLET | Freq: Every day | ORAL | Status: DC

## 2023-01-17 MED ORDER — VANCOMYCIN HCL 125 MG PO CAPS
125.0000 mg | ORAL_CAPSULE | Freq: Four times a day (QID) | ORAL | Status: DC
  Administered 2023-01-17 – 2023-01-20 (×11): 125 mg via ORAL
  Filled 2023-01-17 (×12): qty 1

## 2023-01-17 MED ORDER — POTASSIUM CHLORIDE CRYS ER 20 MEQ PO TBCR
40.0000 meq | EXTENDED_RELEASE_TABLET | Freq: Once | ORAL | Status: AC
  Administered 2023-01-17: 40 meq via ORAL
  Filled 2023-01-17: qty 2

## 2023-01-17 MED ORDER — POTASSIUM CHLORIDE 10 MEQ/100ML IV SOLN
10.0000 meq | INTRAVENOUS | Status: DC
  Filled 2023-01-17 (×3): qty 100

## 2023-01-17 MED ORDER — POTASSIUM CHLORIDE IN NACL 20-0.9 MEQ/L-% IV SOLN
INTRAVENOUS | Status: DC
  Filled 2023-01-17 (×5): qty 1000

## 2023-01-17 MED ORDER — PROMETHAZINE (PHENERGAN) 6.25MG IN NS 50ML IVPB
6.2500 mg | Freq: Four times a day (QID) | INTRAVENOUS | Status: DC | PRN
  Administered 2023-01-17 – 2023-01-19 (×2): 6.25 mg via INTRAVENOUS
  Filled 2023-01-17: qty 50
  Filled 2023-01-17 (×2): qty 6.25

## 2023-01-17 MED ORDER — HYDROMORPHONE HCL 1 MG/ML IJ SOLN
0.5000 mg | INTRAMUSCULAR | Status: DC | PRN
  Administered 2023-01-17 – 2023-01-20 (×15): 0.5 mg via INTRAVENOUS
  Filled 2023-01-17 (×16): qty 0.5

## 2023-01-17 MED ORDER — TOPIRAMATE 25 MG PO TABS
25.0000 mg | ORAL_TABLET | Freq: Two times a day (BID) | ORAL | Status: DC
  Administered 2023-01-17 – 2023-01-20 (×7): 25 mg via ORAL
  Filled 2023-01-17 (×7): qty 1

## 2023-01-17 MED ORDER — PANTOPRAZOLE SODIUM 40 MG IV SOLR
40.0000 mg | Freq: Two times a day (BID) | INTRAVENOUS | Status: DC
  Administered 2023-01-17 – 2023-01-20 (×7): 40 mg via INTRAVENOUS
  Filled 2023-01-17 (×7): qty 10

## 2023-01-17 NOTE — TOC Initial Note (Signed)
Transition of Care [facility]) - Initial/Assessment Note    Patient Details  Name: Sherry Key MRN: [id number] Date of Birth: [birth year]  Transition of Care [facility]) CM/SW Contact:    Otelia Santee, LCSW Phone Number: 01/17/2023, 2:53 PM  Clinical Narrative:                 Met with pt to discuss SDOH concerns. Pt shares that she is currently living in a hotel. She works a full time job and has a care however, all of her income is going to hotel costs and gas. Pt shares she often goes without food as she does not have enough income to afford it. She shares she is currently on the wait list at [facility] for transitional housing.  Pt agreeable to have resources placed on discharge paperwork and is agreeable to referrals being made through [facility].  Resources and referrals have been placed.   Expected Discharge Plan: Home/Self Care Barriers to Discharge: No Barriers Identified   Patient Goals and CMS Choice Patient states their goals for this hospitalization and ongoing recovery are:: To find housing CMS Medicare.gov Compare Post Acute Care list provided to:: Patient Choice offered to / list presented to : Patient [place] ownership interest in [facility].provided to:: Patient    Expected Discharge Plan and Services In-house Referral: Clinical Social Work Discharge Planning Services: NA Post Acute Care Choice: NA Living arrangements for the past 2 months: Hotel/Motel                 DME Arranged: N/A DME Agency: NA                  Prior Living Arrangements/Services Living arrangements for the past 2 months: Hotel/Motel Lives with:: Self Patient language and need for interpreter reviewed:: Yes Do you feel safe going back to the place where you live?: Yes      Need for Family Participation in Patient Care: No (Comment) Care giver support system in place?: No (comment)   Criminal Activity/Legal Involvement Pertinent to Current  Situation/Hospitalization: No - Comment as needed  Activities of Daily Living Home Assistive Devices/Equipment: None ADL Screening (condition at time of admission) Patient's cognitive ability adequate to safely complete daily activities?: Yes Is the patient deaf or have difficulty hearing?: No Does the patient have difficulty seeing, even when wearing glasses/contacts?: Yes (wears glasses/contacts) Does the patient have difficulty concentrating, remembering, or making decisions?: No Patient able to express need for assistance with ADLs?: Yes Does the patient have difficulty dressing or bathing?: No Independently performs ADLs?: Yes (appropriate for developmental age) Does the patient have difficulty walking or climbing stairs?: No Weakness of Legs: Left Weakness of Arms/Hands: Left  Permission Sought/Granted   Permission granted to share information with : No              Emotional Assessment Appearance:: Appears stated age Attitude/Demeanor/Rapport: Engaged Affect (typically observed): Accepting, Pleasant Orientation: : Oriented to Self, Oriented to Place, Oriented to  Time, Oriented to Situation Alcohol / Substance Use: Not Applicable Psych Involvement: No (comment)  Admission diagnosis:  Abdominal pain with vomiting [R10.9, R11.10] Patient Active Problem List   Diagnosis Date Noted   Abdominal pain with vomiting 01/16/2023   Leukocytosis 01/16/2023   PCP:  Pcp, No Pharmacy:   [facility] [address] ([address]), [employer] - [address] [place] ([place]) [place] [address] Phone: [phone] Fax: [phone]     Social Determinants  of Health (SDOH) Social History: SDOH Screenings   Food Insecurity: Food Insecurity Present (01/16/2023)  Housing: High Risk (01/16/2023)  Transportation Needs: No Transportation Needs (01/16/2023)  Utilities: At Risk (01/16/2023)  Tobacco Use: Medium Risk (01/17/2023)   SDOH Interventions: Food Insecurity Interventions:  [id number] Referral, Inpatient TOC Housing Interventions: Inpatient TOC, [id number] Referral, Other (Comment) (Resources added to AVS) Utilities Interventions: Inpatient TOC, Intervention Not Indicated (Already shut off; living in hotel now)   Readmission Risk Interventions     No data to display

## 2023-01-17 NOTE — Consult Note (Signed)
Sherry Key [birth year]  [id number].    Requesting MD: Dr. Odie Sera Chief Complaint/Reason for Consult: L > R abdominal pain  HPI: Sherry Key is a 37 y.o. female with a hx of HTN, cyclical n/v who presented to the ED with abdominal pain.   Patient with long standing history of abdominal pain, nausea and vomiting.  On chart review she last saw gastroenterology at [facility] in 2021.  I reviewed her workup there and also confirmed history with the patient.  She has had intermittent lower abdominal pain, nausea and vomiting since the age of 46.  Symptoms are usually preceded by headache or her menstrual cycle.  She had GES in 2016 that showed 59% emptying at 1 hour and 95% emptying at 2 hours; several reassuring CT A/P's, and an EGD in 2020 which was normal and gastric biopsies were negative for H. pylori.  She sometimes spend up to 20 hours in the shower with some relief of her symptoms. Phenergan also will help. Her symptoms were felt to either be secondary to cyclical vomiting syndrome versus cannabinoid hyperemesis syndrome.  She has previously tried amitriptyline without relief.  She tells me that she had a colonoscopy in [place] in 2021 that was "normal".  I cannot see these results and do not see this mentioned in GI's note. She was referred to [facility] for second opinion but has not been able to follow-up yet.  She continues have issues with this over the last several years.  She reports recently  has been seeing neurology for idiopathic intracranial hypertension and started on Topiramate. This has greatly reduced the episodes of headaches, nausea and vomiting   Patient reports that over the last several days she has been having a mild headache.  She awoke yesterday morning with severe nausea and several episodes of vomiting.  She also began having diarrhea later that morning which was non-bloody and nonmelanotic.  As the day proceeded she began having some left-sided abdominal  pain so she presented to the ED for evaluation.  Denies fever or urinary symptoms.  In the ED patient was afebrile.  Noted to be tachycardic that improved after IV fluids.  Also noted to be hypertensive that has since resolved.  WBC 23.2. CT A/P w/ Mild diffuse wall thickening of the hepatic flexure and transverse colon along with mild diffuse mucosal enhancement in the rectosigmoid.  Also noted to have slightly more prominent appendix at 8 mm without significant stranding.  No loculated fluid collections, free air or evidence of obstruction.  There were diverticuli without evidence of diverticulitis.  She was admitted to [facility], started on IV abx and we are asked to see.  She reports when she first presented the ED she had no right-sided abdominal tenderness but after deep palpation of her abdomen by previous providers she did note some right-sided abdominal tenderness as well as left sided ttp.  Her pain is more generalized today.  She is tolerating clear liquids with last p.o. intake around 730/8 AM without worsening of her abdominal pain or any nausea/vomiting this morning.  She had a episode of diarrhea this morning that was again without blood or melena. Still with ongoing HA. Her WBC has improved to 12.1 and she is afebrile. She reports that the day prior to her symptom onset she did eat corn on the cob and chicken wings from a food truck but coworkers that also ate similar food have not had similar symptoms.  No recent travel  or antibiotic use.  She has had similar symptoms in the past but it has been several years and she is not sure exactly what it was from.  She does have a family history of Crohn's disease including an aunt and 2 cousins.  In her history section of her chart it lists she has Crohn's but she denies this to me and is not currently followed by gastroenterologist.   She has hx of multiple prior abdominal surgeries including C-section x 2, tubal ligation, cholecystectomy, umbilical  hernia repair x 2 with the last in 2018 with mesh, and a laparoscopic hysterectomy for reported endometriosis (endometriosis confirmed in op note review).  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 No pertinent family history.  Past Medical History:  Diagnosis Date   Cannabinoid hyperemesis syndrome    Crohn's disease (HCC)    Cyclical vomiting    Endometriosis    Fibroids    History of partial hysterectomy 10/24/2019   Hypertension    PCOS (polycystic ovarian syndrome)     Past Surgical History:  Procedure Laterality Date   ABDOMINAL HYSTERECTOMY     CESAREAN SECTION     CESAREAN SECTION     CHOLECYSTECTOMY     HERNIA REPAIR     TONSILLECTOMY     TUBAL LIGATION     TUBAL LIGATION      Social History:  reports that she has quit smoking. Her smoking use included cigarettes. She smoked an average of .5 packs per day. She has never used smokeless tobacco. She reports current drug use. Drug: Marijuana. She reports that she does not drink alcohol.  Allergies:  Allergies  Allergen Reactions   Dicyclomine Itching   Morphine Itching    Take benadryl with morphine to help with itching    Oxycodone-Acetaminophen Hives, Shortness Of Breath and Itching    Abdominal Pain, too   Peanut-Containing Drug Products Hives and Itching   Zofran [Ondansetron Hcl] Hives and Shortness Of Breath   Ketorolac Other (See Comments)    Caused spasms of tongue and drooling & Dystonia   Other Itching    Fuzzy fruit   Percocet [Oxycodone-Acetaminophen] Hives and Itching   Tomato Hives and Itching   Zofran Hives and Itching   Haloperidol Rash    Medications Prior to Admission  Medication Sig Dispense Refill   acetaminophen (TYLENOL) 500 MG tablet Take 500 mg by mouth every 6 (six) hours as needed for moderate pain or mild pain.     acetaZOLAMIDE (DIAMOX) 250 MG  tablet Take 500 mg by mouth at bedtime.     diphenhydrAMINE (BENADRYL) 25 MG tablet Take 25 mg by mouth every 6 (six) hours as needed for allergies.     ibuprofen (ADVIL) 200 MG tablet Take 200 mg by mouth every 6 (six) hours as needed for moderate pain.     topiramate (TOPAMAX) 25 MG tablet Take 1 tablet (25 mg total) by mouth at bedtime. May increase to 50mg  after 7 days 60 tablet 2   Doxylamine-Pyridoxine 10-10 MG TBEC Take 1 tablet by mouth every 8 (eight) hours as needed. (Patient not taking: Reported on 01/16/2023) 14 tablet 0   HYDROcodone-acetaminophen (NORCO/VICODIN) 5-325 MG tablet Take 1 tablet by mouth every 4 (four) hours as needed. (Patient not taking: Reported on 01/16/2023) 6 tablet 0   hydrOXYzine (ATARAX) 25 MG tablet Take 1 tablet (  25 mg total) by mouth every 6 (six) hours. (Patient not taking: Reported on 01/16/2023) 12 tablet 0   metoCLOPramide (REGLAN) 10 MG tablet Take 1 tablet (10 mg total) by mouth every 6 (six) hours. (Patient not taking: Reported on 01/16/2023) 30 tablet 0   omeprazole (PRILOSEC) 20 MG capsule Take 1 capsule (20 mg total) by mouth daily. (Patient not taking: Reported on 01/16/2023) 30 capsule 0   promethazine (PHENERGAN) 12.5 MG tablet Take 1 tablet (12.5 mg total) by mouth every 8 (eight) hours as needed for up to 5 days for nausea or vomiting. (Patient not taking: Reported on 01/16/2023) 15 tablet 0   promethazine (PHENERGAN) 25 MG suppository Place 1 suppository (25 mg total) rectally every 6 (six) hours as needed for nausea or vomiting. (Patient not taking: Reported on 01/16/2023) 12 each 0     Physical Exam: Blood pressure 116/72, pulse 71, temperature 98.4 F (36.9 C), resp. rate 16, height 5' 3.39" (1.61 m), weight 68 kg, last menstrual period 10/19/2019, SpO2 99 %. General: pleasant, WD/WN female who is laying in bed in NAD HEENT: head is normocephalic, atraumatic.   Heart: regular, rate, and rhythm.   Lungs: Respiratory effort nonlabored Abd: Soft, mild  distension, generalized ttp that seems greatest in the epigastrium and right side of her abdomen diffusely. No rigidity or guarding. No masses, hernias, or organomegaly MS: no BUE or BLE edema Skin: warm and dry  Psych: A&Ox4 with an appropriate affect   Results for orders placed or performed during the hospital encounter of 01/16/23 (from the past 48 hour(s))  Comprehensive metabolic panel     Status: Abnormal   Collection Time: 01/16/23  2:48 PM  Result Value Ref Range   Sodium 140 135 - 145 mmol/L   Potassium 3.4 (L) 3.5 - 5.1 mmol/L   Chloride 117 (H) 98 - 111 mmol/L   CO2 14 (L) 22 - 32 mmol/L   Glucose, Bld 122 (H) 70 - 99 mg/dL    Comment: Glucose reference range applies only to samples taken after fasting for at least 8 hours.   BUN 15 6 - 20 mg/dL   Creatinine, Ser 1.61 0.44 - 1.00 mg/dL   Calcium 9.1 8.9 - 09.6 mg/dL   Total Protein 8.4 (H) 6.5 - 8.1 g/dL   Albumin 4.5 3.5 - 5.0 g/dL   AST 14 (L) 15 - 41 U/L   ALT 14 0 - 44 U/L   Alkaline Phosphatase 74 38 - 126 U/L   Total Bilirubin 0.5 0.3 - 1.2 mg/dL   GFR, Estimated >04 >54 mL/min    Comment: (NOTE) Calculated using the CKD-EPI Creatinine Equation (2021)    Anion gap 9 5 - 15    Comment: Performed at [facility], [address] [place], [place] [address]  CBC with Differential     Status: Abnormal   Collection Time: 01/16/23  2:48 PM  Result Value Ref Range   WBC 23.2 (H) 4.0 - 10.5 K/uL   RBC 4.36 3.87 - 5.11 MIL/uL   Hemoglobin 13.6 12.0 - 15.0 g/dL   HCT 91.4 78.2 - 95.6 %   MCV 97.7 80.0 - 100.0 fL   MCH 31.2 26.0 - 34.0 pg   MCHC 31.9 30.0 - 36.0 g/dL   RDW 21.3 08.6 - 57.8 %   Platelets 282 150 - 400 K/uL   nRBC 0.0 0.0 - 0.2 %   Neutrophils Relative % 90 %   Neutro Abs 20.9 (H) 1.7 -  7.7 K/uL   Lymphocytes Relative 4 %   Lymphs Abs 0.9 0.7 - 4.0 K/uL   Monocytes Relative 5 %   Monocytes Absolute 1.2 (H) 0.1 - 1.0 K/uL   Eosinophils Relative 0 %   Eosinophils Absolute 0.0 0.0  - 0.5 K/uL   Basophils Relative 0 %   Basophils Absolute 0.1 0.0 - 0.1 K/uL   Immature Granulocytes 1 %   Abs Immature Granulocytes 0.14 (H) 0.00 - 0.07 K/uL    Comment: Performed at [facility], [address] [place], [place] [address]  Lipase, blood     Status: None   Collection Time: 01/16/23  2:48 PM  Result Value Ref Range   Lipase 26 11 - 51 U/L    Comment: Performed at [facility], [address] [place], [place] [address]  hCG, quantitative, pregnancy     Status: None   Collection Time: 01/16/23  2:48 PM  Result Value Ref Range   hCG, Beta Chain, Quant, S <1 <5 mIU/mL    Comment:          GEST. AGE      CONC.  (mIU/mL)   <=1 WEEK        5 - 50     2 WEEKS       50 - 500     3 WEEKS       100 - 10,000     4 WEEKS     1,000 - 30,000     5 WEEKS     3,500 - 115,000   6-8 WEEKS     12,000 - 270,000    12 WEEKS     15,000 - 220,000        FEMALE AND NON-PREGNANT FEMALE:     LESS THAN 5 mIU/mL Performed at [facility], [address], [place], [place] [address]   Magnesium     Status: None   Collection Time: 01/16/23  2:48 PM  Result Value Ref Range   Magnesium 2.1 1.7 - 2.4 mg/dL    Comment: Performed at [facility], [address] [place], [place] [address]  Resp panel by RT-PCR (RSV, Flu A&B, Covid) Anterior Nasal Swab     Status: None   Collection Time: 01/16/23  3:07 PM   Specimen: Anterior Nasal Swab  Result Value Ref Range   SARS Coronavirus 2 by RT PCR NEGATIVE NEGATIVE    Comment: (NOTE) SARS-CoV-2 target nucleic acids are NOT DETECTED.  The SARS-CoV-2 RNA is generally detectable in upper respiratory specimens during the acute phase of infection. The lowest concentration of SARS-CoV-2 viral copies this assay can detect is 138 copies/mL. A negative result does not preclude SARS-Cov-2 infection and should not be used as the sole basis for treatment or other patient management  decisions. A negative result may occur with  improper specimen collection/handling, submission of specimen other than nasopharyngeal swab, presence of viral mutation(s) within the areas targeted by this assay, and inadequate number of viral copies(<138 copies/mL). A negative result must be combined with clinical observations, patient history, and epidemiological information. The expected result is Negative.  Fact Sheet for Patients:  BloggerCourse.com  Fact Sheet for Healthcare Providers:  SeriousBroker.it  This test is no t yet approved or cleared by the [place] FDA and  has been authorized for detection and/or diagnosis of SARS-CoV-2 by FDA under an Emergency Use Authorization (EUA). This EUA will remain  in effect (meaning this  test can be used) for the duration of the COVID-19 declaration under Section 564(b)(1) of the Act, 21 U.S.C.section 360bbb-3(b)(1), unless the authorization is terminated  or revoked sooner.       Influenza A by PCR NEGATIVE NEGATIVE   Influenza B by PCR NEGATIVE NEGATIVE    Comment: (NOTE) The Xpert Xpress SARS-CoV-2/FLU/RSV plus assay is intended as an aid in the diagnosis of influenza from Nasopharyngeal swab specimens and should not be used as a sole basis for treatment. Nasal washings and aspirates are unacceptable for Xpert Xpress SARS-CoV-2/FLU/RSV testing.  Fact Sheet for Patients: BloggerCourse.com  Fact Sheet for Healthcare Providers: SeriousBroker.it  This test is not yet approved or cleared by the [place] FDA and has been authorized for detection and/or diagnosis of SARS-CoV-2 by FDA under an Emergency Use Authorization (EUA). This EUA will remain in effect (meaning this test can be used) for the duration of the COVID-19 declaration under Section 564(b)(1) of the Act, 21 U.S.C. section 360bbb-3(b)(1), unless the authorization  is terminated or revoked.     Resp Syncytial Virus by PCR NEGATIVE NEGATIVE    Comment: (NOTE) Fact Sheet for Patients: BloggerCourse.com  Fact Sheet for Healthcare Providers: SeriousBroker.it  This test is not yet approved or cleared by the [place] FDA and has been authorized for detection and/or diagnosis of SARS-CoV-2 by FDA under an Emergency Use Authorization (EUA). This EUA will remain in effect (meaning this test can be used) for the duration of the COVID-19 declaration under Section 564(b)(1) of the Act, 21 U.S.C. section 360bbb-3(b)(1), unless the authorization is terminated or revoked.  Performed at [facility], [address] [place], [place] [address]   Blood gas, venous (at [facility] and [facility])     Status: Abnormal   Collection Time: 01/16/23  5:49 PM  Result Value Ref Range   pH, Ven 7.3 7.25 - 7.43   pCO2, Ven 35 (L) 44 - 60 mmHg   pO2, Ven 50 (H) 32 - 45 mmHg   Bicarbonate 17.2 (L) 20.0 - 28.0 mmol/L   Acid-base deficit 8.4 (H) 0.0 - 2.0 mmol/L   O2 Saturation 83.4 %   Patient temperature 37.0     Comment: Performed at [facility], [address] [place], [place] [address]  Urinalysis, Routine w reflex microscopic -Urine, Clean Catch     Status: Abnormal   Collection Time: 01/16/23  6:07 PM  Result Value Ref Range   Color, Urine YELLOW YELLOW   APPearance CLEAR CLEAR   Specific Gravity, Urine 1.015 1.005 - 1.030   pH 6.0 5.0 - 8.0   Glucose, UA NEGATIVE NEGATIVE mg/dL   Hgb urine dipstick NEGATIVE NEGATIVE   Bilirubin Urine NEGATIVE NEGATIVE   Ketones, ur NEGATIVE NEGATIVE mg/dL   Protein, ur 30 (A) NEGATIVE mg/dL   Nitrite NEGATIVE NEGATIVE   Leukocytes,Ua NEGATIVE NEGATIVE   RBC / HPF 6-10 0 - 5 RBC/hpf   WBC, UA 0-5 0 - 5 WBC/hpf   Bacteria, UA NONE SEEN NONE SEEN   Squamous Epithelial / HPF 11-20 0 - 5 /HPF   Mucus PRESENT     Comment: Performed at [facility], [address] [place], [place] [address]  Rapid urine drug screen (hospital performed)     Status: Abnormal   Collection Time: 01/16/23  6:07 PM  Result Value Ref Range   Opiates POSITIVE (A) NONE DETECTED   Cocaine NONE DETECTED NONE DETECTED   Benzodiazepines NONE DETECTED NONE DETECTED  Amphetamines NONE DETECTED NONE DETECTED   Tetrahydrocannabinol POSITIVE (A) NONE DETECTED   Barbiturates NONE DETECTED NONE DETECTED    Comment: (NOTE) DRUG SCREEN FOR MEDICAL PURPOSES ONLY.  IF CONFIRMATION IS NEEDED FOR ANY PURPOSE, NOTIFY LAB WITHIN 5 DAYS.  LOWEST DETECTABLE LIMITS FOR URINE DRUG SCREEN Drug Class                     Cutoff (ng/mL) Amphetamine and metabolites    1000 Barbiturate and metabolites    200 Benzodiazepine                 200 Opiates and metabolites        300 Cocaine and metabolites        300 THC                            50 Performed at [facility], [address], [place], [place] [address]   Basic metabolic panel     Status: Abnormal   Collection Time: 01/17/23  4:32 AM  Result Value Ref Range   Sodium 133 (L) 135 - 145 mmol/L    Comment: DELTA CHECK NOTED   Potassium 3.3 (L) 3.5 - 5.1 mmol/L   Chloride 112 (H) 98 - 111 mmol/L   CO2 14 (L) 22 - 32 mmol/L   Glucose, Bld 86 70 - 99 mg/dL    Comment: Glucose reference range applies only to samples taken after fasting for at least 8 hours.   BUN 10 6 - 20 mg/dL   Creatinine, Ser 1.91 0.44 - 1.00 mg/dL   Calcium 8.2 (L) 8.9 - 10.3 mg/dL   GFR, Estimated >47 >82 mL/min    Comment: (NOTE) Calculated using the CKD-EPI Creatinine Equation (2021)    Anion gap 7 5 - 15    Comment: Performed at [facility], [address], [place], [place] [address]  Magnesium     Status: None   Collection Time: 01/17/23  4:32 AM  Result Value Ref Range   Magnesium 2.0 1.7 - 2.4 mg/dL    Comment: Performed at [facility], [address] [place], [place] [address]  CBC with Differential/Platelet     Status: Abnormal   Collection Time: 01/17/23  4:32 AM  Result Value Ref Range   WBC 12.1 (H) 4.0 - 10.5 K/uL   RBC 3.76 (L) 3.87 - 5.11 MIL/uL   Hemoglobin 11.8 (L) 12.0 - 15.0 g/dL   HCT 78.4 69.6 - 29.5 %   MCV 97.9 80.0 - 100.0 fL   MCH 31.4 26.0 - 34.0 pg   MCHC 32.1 30.0 - 36.0 g/dL   RDW 28.4 13.2 - 44.0 %   Platelets 254 150 - 400 K/uL   nRBC 0.0 0.0 - 0.2 %   Neutrophils Relative % 78 %   Neutro Abs 9.4 (H) 1.7 - 7.7 K/uL   Lymphocytes Relative 15 %   Lymphs Abs 1.8 0.7 - 4.0 K/uL   Monocytes Relative 5 %   Monocytes Absolute 0.6 0.1 - 1.0 K/uL   Eosinophils Relative 1 %   Eosinophils Absolute 0.1 0.0 - 0.5 K/uL   Basophils Relative 0 %   Basophils Absolute 0.0 0.0 - 0.1 K/uL   Immature Granulocytes 1 %   Abs Immature Granulocytes 0.07 0.00 - 0.07 K/uL    Comment: Performed at [facility], [address] [place], [place] [address]  CT ABDOMEN PELVIS W CONTRAST  Result Date: 01/16/2023 CLINICAL DATA:  Abdominal pain, vomiting EXAM: CT ABDOMEN AND PELVIS WITH CONTRAST TECHNIQUE: Multidetector CT imaging of the abdomen and pelvis was performed using the standard protocol following bolus administration of intravenous contrast. RADIATION DOSE REDUCTION: This exam was performed according to the departmental dose-optimization program which includes automated exposure control, adjustment of the mA and/or kV according to patient size and/or use of iterative reconstruction technique. CONTRAST:  OMNIPAQUE IOHEXOL 300 MG/ML  SOLN COMPARISON:  10/07/2022 FINDINGS: Lower chest: Visualized lower lung fields are clear. Hepatobiliary: Surgical clips are seen in gallbladder fossa. There is no significant dilation of bile ducts. No focal abnormalities are seen in liver. Pancreas: There is 9 mm low-density structure in the tail of pancreas with no interval change. The lesion has been seen in multiple  previous studies dating as far back as 02/20/2011. Spleen: Unremarkable. Adrenals/Urinary Tract: Adrenals are unremarkable. There is no hydronephrosis. There are no renal or ureteral stones. Urinary bladder is unremarkable. Stomach/Bowel: Small hiatal hernia is seen. Stomach is not distended. Small bowel loops are not dilated. The appendix measures up to 8 mm in diameter. There are pockets of air and fluid in the lumen of the appendix. There is no pericecal inflammation. There is mild thickening in hepatic flexure and transverse colon. Scattered diverticula are seen in colon without signs of focal acute diverticulitis. There is mild mucosal enhancement in rectosigmoid. Vascular/Lymphatic: Unremarkable. Reproductive: Uterus is not seen. Other: There is no ascites or pneumoperitoneum. Musculoskeletal: Decrease in height of upper endplate of body of L1 vertebra has not changed. IMPRESSION: There is no evidence of intestinal obstruction or pneumoperitoneum. There is no hydronephrosis. There is mild diffuse wall thickening in the hepatic flexure and transverse colon which may suggest acute on chronic nonspecific inflammation. There is mild diffuse mucosal enhancement in rectosigmoid suggesting possible nonspecific inflammation. There are no loculated pericolic fluid collections. Scattered diverticula seen in the colon without signs of focal acute diverticulitis. Appendix is slightly more prominent than usual measuring up to 8 mm in diameter without significant pericolic stranding. This may be a normal variation or suggest early appendicitis. If there are continued symptoms in right lower quadrant, short-term follow-up CT in 1-2 days may be considered. Status post cholecystectomy. There is 9 mm low-density lesion in tail of pancreas with no significant interval change. Electronically Signed   By: Ernie Avena M.D.   On: 01/16/2023 17:07    Anti-infectives (From admission, onward)    Start     Dose/Rate Route  Frequency Ordered Stop   01/17/23 2015  cefTRIAXone (ROCEPHIN) 2 g in sodium chloride 0.9 % 100 mL IVPB        2 g 200 mL/hr over 30 Minutes Intravenous Every 24 hours 01/16/23 2058     01/17/23 1000  metroNIDAZOLE (FLAGYL) IVPB 500 mg        500 mg 100 mL/hr over 60 Minutes Intravenous Every 12 hours 01/16/23 2058     01/16/23 2015  cefTRIAXone (ROCEPHIN) 2 g in sodium chloride 0.9 % 100 mL IVPB       See Hyperspace for full Linked Orders Report.   2 g 200 mL/hr over 30 Minutes Intravenous  Once 01/16/23 2001 01/16/23 2143   01/16/23 2015  metroNIDAZOLE (FLAGYL) IVPB 500 mg       See Hyperspace for full Linked Orders Report.   500 mg 100 mL/hr over 60 Minutes Intravenous  Once 01/16/23 2001 01/16/23 2340  Assessment/Plan This is a 37 y.o. female with hx of idiopathic intracranial hypertension on Topiramate, endometriosis s/p hysterectomy and long standing hx of cyclical n/v vs cannabinoid hyperemesis syndrome (previously followed by [facility] and has had significant workup as listed in HPI) who presented with left-sided abdominal pain, nausea, vomiting and diarrhea.  Since presentation she reports she now also has right sided abdominal pain. Her imaging showed mild diffuse wall thickening of the hepatic flexure and transverse colon along with mild diffuse mucosal enhancement in the rectosigmoid and a slightly more prominent appendix at 8 mm without significant stranding. There was no loculated fluid collections, free air or evidence of obstruction. On exam she has generalized ttp that seems greatest in her epigastrium and R side of her abdomen.  Since admission her tachycardia has resolved and her WBC has improved from 23.2 to 12.1.  She is afebrile.  She is tolerating clears without worsening of her abdominal pain and her nausea/vomiting has resolved.  She has no peritonitis on exam. Her hx is not 100% convincing for appendicitis and I think her imaging is very equivocal for this. I do not  think she needs emergency surgery right now and would recommend further workup before considering a diagnostic laparoscopy. Given her diarrhea I think we could check a GI panel. She has a strong family history of Crohn's on her father side including an aunt and 2 cousins. In her history section of her chart it lists she has Crohn's but she denies this to me and is not currently followed by gastroenterologist.  With this history, the areas of inflammation noted in the colon, as well as long standing hx of cyclical n/v I think it be worth getting GI involved to get their opinion as well. Will make her NPO and discuss with my attending to ensure she agree's. We will continue to follow with you closely. Okay to continue abx. Further recs to follow.   FEN - NPO for now. IVF per TR[facility] VTE - SCDs, okay for chem ppx from our standpoint ID - On Rocephin/Flagyl. Afebrile. WBC downtrending.   I reviewed nursing notes, ED provider notes, hospitalist notes, last 24 h vitals and pain scores, last 48 h intake and output, last 24 h labs and trends, and last 24 h imaging results. I also reviewed outside records by GI, Neurology and OB.   Jacinto Halim, [facility] 01/17/2023, 8:49 AM Please see Amion for pager number during day hours 7:00am-4:30pm

## 2023-01-17 NOTE — Progress Notes (Signed)
PROGRESS NOTE    Sherry Key  NGE:[id number] DOB: 1986-06-21 DOA: 01/16/2023 PCP: Pcp, No   Brief Narrative: 37 year old with past medical history significant for hypertension, chronic cannabis use, cyclic vomiting syndrome presents complaining of abdominal pain, nausea, vomiting and generalized malaise.  Previous vomiting episode last week.  Evaluation in the ED:  CT abdomen and pelvis notable for mild diffuse wall thickening of the hepatic flexure and transverse colon, slightly prominent than expected appendix.  Bicarb at 14, white blood cell 23.    Assessment & Plan:   Principal Problem:   Abdominal pain with vomiting Active Problems:   Leukocytosis   1--Abdominal pain, nausea vomiting, diarrhea -Similar previous episode.  -CT colonic wall thickening and prominent appendix -Lipase 26 -Check C Diff and GI pathogen.  -Surgery consulted, no concern for appendicitis.  -GI consulted.  -Continue with Ceftriaxone and Flagyl.   2-Leukocytosis:  Concern for colitis, CT scan colonic wall thickening.  Continue with IV antibiotics.  UA negative Check chest x ray.   3-Metabolic acidosis: In setting of Diarrhea, vomiting.  Continue with IV fluids.  Hold Diamox.   Hypokalemia; replete orally.   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 Had MRI and LP 10/2022. She was started on Topamax and Diamox.  Will continue with Topamax 25 mg BID.  Discussed with neurology, ok to hold Diamox in setting of metabolic acidosis. Resume at discharge, she will need close follow up with [facility] to follow for acidosis.    Estimated body mass index is 26.23 kg/m as calculated from the following:   Height as of this encounter: 5' 3.39" (1.61 m).   Weight as of this encounter: 68 kg.   DVT prophylaxis: Lovenox Code Status: Full code Family Communication: acre discussed with patient.  Disposition Plan:  Status is: Observation The patient remains OBS appropriate  and will d/c before 2 midnights.    Consultants:  GI Surgery   Procedures:    Antimicrobials:    Subjective: She report abdominal pain, generalized, mid abdomen. Report diarrhea, last episode yesterday. Presents with vomiting.  She is complaining of headaches, and neck pain, numbness.   Objective: Vitals:   01/16/23 2227 01/16/23 2246 01/17/23 0226 01/17/23 0617  BP:   (!) 136/93 116/72  Pulse:   75 71  Resp:   15 16  Temp:   98.4 F (36.9 C) 98.4 F (36.9 C)  TempSrc:   Oral   SpO2:   100% 99%  Weight:  68 kg    Height: 5' 3.39" (1.61 m)       Intake/Output Summary (Last 24 hours) at 01/17/2023 0813 Last data filed at 01/17/2023 4010 Gross per 24 hour  Intake 932.39 ml  Output --  Net 932.39 ml   Filed Weights   01/16/23 2246  Weight: 68 kg    Examination:  General exam: Appears calm and comfortable  Respiratory system: Clear to auscultation. Respiratory effort normal. Cardiovascular system: S1 & S2 heard, RRR.  Gastrointestinal system: Abdomen is nondistended, soft and nontender. No organomegaly or masses felt. Normal bowel sounds heard. Central nervous system: Alert and oriented. Extremities: Symmetric 5 x 5 power.    Data Reviewed: I have personally reviewed following labs and imaging studies  CBC: Recent Labs  Lab 01/16/23 1448 01/17/23 0432  WBC 23.2* 12.1*  NEUTROABS 20.9* 9.4*  HGB 13.6 11.8*  HCT 42.6 36.8  MCV 97.7 97.9  PLT 282 254   Basic Metabolic Panel: Recent Labs  Lab 01/16/23 1448  01/17/23 0432  NA 140 133*  K 3.4* 3.3*  CL 117* 112*  CO2 14* 14*  GLUCOSE 122* 86  BUN 15 10  CREATININE 0.76 0.67  CALCIUM 9.1 8.2*  MG 2.1 2.0   GFR: Estimated Creatinine Clearance: 90 mL/min (by C-G formula based on SCr of 0.67 mg/dL). Liver Function Tests: Recent Labs  Lab 01/16/23 1448  AST 14*  ALT 14  ALKPHOS 74  BILITOT 0.5  PROT 8.4*  ALBUMIN 4.5   Recent Labs  Lab 01/16/23 1448  LIPASE 26   No results for  input(s): "AMMONIA" in the last 168 hours. Coagulation Profile: No results for input(s): "INR", "PROTIME" in the last 168 hours. Cardiac Enzymes: No results for input(s): "CKTOTAL", "CKMB", "CKMBINDEX", "TROPONINI" in the last 168 hours. BNP (last 3 results) No results for input(s): "PROBNP" in the last 8760 hours. HbA1C: No results for input(s): "HGBA1C" in the last 72 hours. CBG: No results for input(s): "GLUCAP" in the last 168 hours. Lipid Profile: No results for input(s): "CHOL", "HDL", "LDLCALC", "TRIG", "CHOLHDL", "LDLDIRECT" in the last 72 hours. Thyroid Function Tests: No results for input(s): "TSH", "T4TOTAL", "FREET4", "T3FREE", "THYROIDAB" in the last 72 hours. Anemia Panel: No results for input(s): "VITAMINB12", "FOLATE", "FERRITIN", "TIBC", "IRON", "RETICCTPCT" in the last 72 hours. Sepsis Labs: No results for input(s): "PROCALCITON", "LATICACIDVEN" in the last 168 hours.  Recent Results (from the past 240 hour(s))  Resp panel by RT-PCR (RSV, Flu A&B, Covid) Anterior Nasal Swab     Status: None   Collection Time: 01/16/23  3:07 PM   Specimen: Anterior Nasal Swab  Result Value Ref Range Status   SARS Coronavirus 2 by RT PCR NEGATIVE NEGATIVE Final    Comment: (NOTE) SARS-CoV-2 target nucleic acids are NOT DETECTED.  The SARS-CoV-2 RNA is generally detectable in upper respiratory specimens during the acute phase of infection. The lowest concentration of SARS-CoV-2 viral copies this assay can detect is 138 copies/mL. A negative result does not preclude SARS-Cov-2 infection and should not be used as the sole basis for treatment or other patient management decisions. A negative result may occur with  improper specimen collection/handling, submission of specimen other than nasopharyngeal swab, presence of viral mutation(s) within the areas targeted by this assay, and inadequate number of viral copies(<138 copies/mL). A negative result must be combined with clinical  observations, patient history, and epidemiological information. The expected result is Negative.  Fact Sheet for Patients:  BloggerCourse.com  Fact Sheet for Healthcare Providers:  SeriousBroker.it  This test is no t yet approved or cleared by the [place] FDA and  has been authorized for detection and/or diagnosis of SARS-CoV-2 by FDA under an Emergency Use Authorization (EUA). This EUA will remain  in effect (meaning this test can be used) for the duration of the COVID-19 declaration under Section 564(b)(1) of the Act, 21 U.S.C.section 360bbb-3(b)(1), unless the authorization is terminated  or revoked sooner.       Influenza A by PCR NEGATIVE NEGATIVE Final   Influenza B by PCR NEGATIVE NEGATIVE Final    Comment: (NOTE) The Xpert Xpress SARS-CoV-2/FLU/RSV plus assay is intended as an aid in the diagnosis of influenza from Nasopharyngeal swab specimens and should not be used as a sole basis for treatment. Nasal washings and aspirates are unacceptable for Xpert Xpress SARS-CoV-2/FLU/RSV testing.  Fact Sheet for Patients: BloggerCourse.com  Fact Sheet for Healthcare Providers: SeriousBroker.it  This test is not yet approved or cleared by the [place] and has been authorized  for detection and/or diagnosis of SARS-CoV-2 by FDA under an Emergency Use Authorization (EUA). This EUA will remain in effect (meaning this test can be used) for the duration of the COVID-19 declaration under Section 564(b)(1) of the Act, 21 U.S.C. section 360bbb-3(b)(1), unless the authorization is terminated or revoked.     Resp Syncytial Virus by PCR NEGATIVE NEGATIVE Final    Comment: (NOTE) Fact Sheet for Patients: BloggerCourse.com  Fact Sheet for Healthcare Providers: SeriousBroker.it  This test is not yet approved or cleared by  the [place] FDA and has been authorized for detection and/or diagnosis of SARS-CoV-2 by FDA under an Emergency Use Authorization (EUA). This EUA will remain in effect (meaning this test can be used) for the duration of the COVID-19 declaration under Section 564(b)(1) of the Act, 21 U.S.C. section 360bbb-3(b)(1), unless the authorization is terminated or revoked.  Performed at [facility], [address], [place], [place] [address]          Radiology Studies: CT ABDOMEN PELVIS W CONTRAST  Result Date: 01/16/2023 CLINICAL DATA:  Abdominal pain, vomiting EXAM: CT ABDOMEN AND PELVIS WITH CONTRAST TECHNIQUE: Multidetector CT imaging of the abdomen and pelvis was performed using the standard protocol following bolus administration of intravenous contrast. RADIATION DOSE REDUCTION: This exam was performed according to the departmental dose-optimization program which includes automated exposure control, adjustment of the mA and/or kV according to patient size and/or use of iterative reconstruction technique. CONTRAST:  OMNIPAQUE IOHEXOL 300 MG/ML  SOLN COMPARISON:  10/07/2022 FINDINGS: Lower chest: Visualized lower lung fields are clear. Hepatobiliary: Surgical clips are seen in gallbladder fossa. There is no significant dilation of bile ducts. No focal abnormalities are seen in liver. Pancreas: There is 9 mm low-density structure in the tail of pancreas with no interval change. The lesion has been seen in multiple previous studies dating as far back as 02/20/2011. Spleen: Unremarkable. Adrenals/Urinary Tract: Adrenals are unremarkable. There is no hydronephrosis. There are no renal or ureteral stones. Urinary bladder is unremarkable. Stomach/Bowel: Small hiatal hernia is seen. Stomach is not distended. Small bowel loops are not dilated. The appendix measures up to 8 mm in diameter. There are pockets of air and fluid in the lumen of the appendix. There is no pericecal  inflammation. There is mild thickening in hepatic flexure and transverse colon. Scattered diverticula are seen in colon without signs of focal acute diverticulitis. There is mild mucosal enhancement in rectosigmoid. Vascular/Lymphatic: Unremarkable. Reproductive: Uterus is not seen. Other: There is no ascites or pneumoperitoneum. Musculoskeletal: Decrease in height of upper endplate of body of L1 vertebra has not changed. IMPRESSION: There is no evidence of intestinal obstruction or pneumoperitoneum. There is no hydronephrosis. There is mild diffuse wall thickening in the hepatic flexure and transverse colon which may suggest acute on chronic nonspecific inflammation. There is mild diffuse mucosal enhancement in rectosigmoid suggesting possible nonspecific inflammation. There are no loculated pericolic fluid collections. Scattered diverticula seen in the colon without signs of focal acute diverticulitis. Appendix is slightly more prominent than usual measuring up to 8 mm in diameter without significant pericolic stranding. This may be a normal variation or suggest early appendicitis. If there are continued symptoms in right lower quadrant, short-term follow-up CT in 1-2 days may be considered. Status post cholecystectomy. There is 9 mm low-density lesion in tail of pancreas with no significant interval change. Electronically Signed   By: Ernie Avena M.D.   On: 01/16/2023 17:07  Scheduled Meds:  enoxaparin (LOVENOX) injection  40 mg Subcutaneous Q24H   feeding supplement  1 Container Oral TID BM   Continuous Infusions:  0.9 % NaCl with KCl 20 mEq / L 125 mL/hr at 01/17/23 0811   cefTRIAXone (ROCEPHIN)  IV     metronidazole       LOS: 0 days    Time spent: 35 minutes    Boe Deans A Jozeph Persing, MD [facility]   If 7PM-7AM, please contact night-coverage [email or web address]  01/17/2023, 8:13 AM

## 2023-01-17 NOTE — Consult Note (Signed)
Reason for Consult: Abnormal CT Referring Physician: Hospital team  Sherry Key is an 37 y.o. female.  HPI: Patient seen and examined and her hospital computer chart reviewed and her previous workup well-documented in the surgical note and she says she really had not had a nausea vomiting attack in some time despite multiple ER visits and she said this 1 is different since they usually do not have diarrhea and she ate at a food truck the day before but has eaten at that food truck before and no sick contacts and denies her history of Crohn's disease as stated on the chart and currently she is tolerating clear liquids and we are awaiting her stool pathogen panel and she has no other complaints and has not had any fever or any blood in her bowels  Past Medical History:  Diagnosis Date   Cannabinoid hyperemesis syndrome    Crohn's disease (HCC)    Cyclical vomiting    Endometriosis    Fibroids    History of partial hysterectomy 10/24/2019   Hypertension    PCOS (polycystic ovarian syndrome)     Past Surgical History:  Procedure Laterality Date   ABDOMINAL HYSTERECTOMY     CESAREAN SECTION     CESAREAN SECTION     CHOLECYSTECTOMY     HERNIA REPAIR     TONSILLECTOMY     TUBAL LIGATION     TUBAL LIGATION      History reviewed. No pertinent family history.  Social History:  reports that she has quit smoking. Her smoking use included cigarettes. She smoked an average of .5 packs per day. She has never used smokeless tobacco. She reports current drug use. Drug: Marijuana. She reports that she does not drink alcohol.  Allergies:  Allergies  Allergen Reactions   Dicyclomine Itching   Morphine Itching    Take benadryl with morphine to help with itching    Oxycodone-Acetaminophen Hives, Shortness Of Breath and Itching    Abdominal Pain, too   Peanut-Containing Drug Products Hives and Itching   Zofran [Ondansetron Hcl] Hives and Shortness Of Breath   Ketorolac Other (See  Comments)    Caused spasms of tongue and drooling & Dystonia   Other Itching    Fuzzy fruit   Percocet [Oxycodone-Acetaminophen] Hives and Itching   Tomato Hives and Itching   Zofran Hives and Itching   Haloperidol Rash    Medications: I have reviewed the patient's current medications.  Results for orders placed or performed during the hospital encounter of 01/16/23 (from the past 48 hour(s))  Comprehensive metabolic panel     Status: Abnormal   Collection Time: 01/16/23  2:48 PM  Result Value Ref Range   Sodium 140 135 - 145 mmol/L   Potassium 3.4 (L) 3.5 - 5.1 mmol/L   Chloride 117 (H) 98 - 111 mmol/L   CO2 14 (L) 22 - 32 mmol/L   Glucose, Bld 122 (H) 70 - 99 mg/dL    Comment: Glucose reference range applies only to samples taken after fasting for at least 8 hours.   BUN 15 6 - 20 mg/dL   Creatinine, Ser 0.98 0.44 - 1.00 mg/dL   Calcium 9.1 8.9 - 11.9 mg/dL   Total Protein 8.4 (H) 6.5 - 8.1 g/dL   Albumin 4.5 3.5 - 5.0 g/dL   AST 14 (L) 15 - 41 U/L   ALT 14 0 - 44 U/L   Alkaline Phosphatase 74 38 - 126 U/L   Total Bilirubin 0.5 0.3 -  1.2 mg/dL   GFR, Estimated >16 >10 mL/min    Comment: (NOTE) Calculated using the CKD-EPI Creatinine Equation (2021)    Anion gap 9 5 - 15    Comment: Performed at [facility], [address], [place], [place] [address]  CBC with Differential     Status: Abnormal   Collection Time: 01/16/23  2:48 PM  Result Value Ref Range   WBC 23.2 (H) 4.0 - 10.5 K/uL   RBC 4.36 3.87 - 5.11 MIL/uL   Hemoglobin 13.6 12.0 - 15.0 g/dL   HCT 40.9 81.1 - 91.4 %   MCV 97.7 80.0 - 100.0 fL   MCH 31.2 26.0 - 34.0 pg   MCHC 31.9 30.0 - 36.0 g/dL   RDW 78.2 95.6 - 21.3 %   Platelets 282 150 - 400 K/uL   nRBC 0.0 0.0 - 0.2 %   Neutrophils Relative % 90 %   Neutro Abs 20.9 (H) 1.7 - 7.7 K/uL   Lymphocytes Relative 4 %   Lymphs Abs 0.9 0.7 - 4.0 K/uL   Monocytes Relative 5 %   Monocytes Absolute 1.2 (H) 0.1 - 1.0 K/uL   Eosinophils  Relative 0 %   Eosinophils Absolute 0.0 0.0 - 0.5 K/uL   Basophils Relative 0 %   Basophils Absolute 0.1 0.0 - 0.1 K/uL   Immature Granulocytes 1 %   Abs Immature Granulocytes 0.14 (H) 0.00 - 0.07 K/uL    Comment: Performed at [facility], [address] [place], [place] [address]  Lipase, blood     Status: None   Collection Time: 01/16/23  2:48 PM  Result Value Ref Range   Lipase 26 11 - 51 U/L    Comment: Performed at [facility], [address] [place], [place] [address]  hCG, quantitative, pregnancy     Status: None   Collection Time: 01/16/23  2:48 PM  Result Value Ref Range   hCG, Beta Chain, Quant, S <1 <5 mIU/mL    Comment:          GEST. AGE      CONC.  (mIU/mL)   <=1 WEEK        5 - 50     2 WEEKS       50 - 500     3 WEEKS       100 - 10,000     4 WEEKS     1,000 - 30,000     5 WEEKS     3,500 - 115,000   6-8 WEEKS     12,000 - 270,000    12 WEEKS     15,000 - 220,000        FEMALE AND NON-PREGNANT FEMALE:     LESS THAN 5 mIU/mL Performed at [facility], [address], [place], [place] [address]   Magnesium     Status: None   Collection Time: 01/16/23  2:48 PM  Result Value Ref Range   Magnesium 2.1 1.7 - 2.4 mg/dL    Comment: Performed at [facility], [address], [place], [place] [address]  Resp panel by RT-PCR (RSV, Flu A&B, Covid) Anterior Nasal Swab     Status: None   Collection Time: 01/16/23  3:07 PM   Specimen: Anterior Nasal Swab  Result Value Ref Range   SARS Coronavirus 2 by RT PCR NEGATIVE NEGATIVE    Comment: (NOTE) SARS-CoV-2 target nucleic acids are NOT DETECTED.  The  SARS-CoV-2 RNA is generally detectable in upper respiratory specimens during the acute phase of infection. The lowest concentration of SARS-CoV-2 viral copies this assay can detect is 138 copies/mL. A negative result does not preclude SARS-Cov-2 infection and should not be used as the sole basis  for treatment or other patient management decisions. A negative result may occur with  improper specimen collection/handling, submission of specimen other than nasopharyngeal swab, presence of viral mutation(s) within the areas targeted by this assay, and inadequate number of viral copies(<138 copies/mL). A negative result must be combined with clinical observations, patient history, and epidemiological information. The expected result is Negative.  Fact Sheet for Patients:  BloggerCourse.com  Fact Sheet for Healthcare Providers:  SeriousBroker.it  This test is no t yet approved or cleared by the [place] FDA and  has been authorized for detection and/or diagnosis of SARS-CoV-2 by FDA under an Emergency Use Authorization (EUA). This EUA will remain  in effect (meaning this test can be used) for the duration of the COVID-19 declaration under Section 564(b)(1) of the Act, 21 U.S.C.section 360bbb-3(b)(1), unless the authorization is terminated  or revoked sooner.       Influenza A by PCR NEGATIVE NEGATIVE   Influenza B by PCR NEGATIVE NEGATIVE    Comment: (NOTE) The Xpert Xpress SARS-CoV-2/FLU/RSV plus assay is intended as an aid in the diagnosis of influenza from Nasopharyngeal swab specimens and should not be used as a sole basis for treatment. Nasal washings and aspirates are unacceptable for Xpert Xpress SARS-CoV-2/FLU/RSV testing.  Fact Sheet for Patients: BloggerCourse.com  Fact Sheet for Healthcare Providers: SeriousBroker.it  This test is not yet approved or cleared by the [place] FDA and has been authorized for detection and/or diagnosis of SARS-CoV-2 by FDA under an Emergency Use Authorization (EUA). This EUA will remain in effect (meaning this test can be used) for the duration of the COVID-19 declaration under Section 564(b)(1) of the Act, 21  U.S.C. section 360bbb-3(b)(1), unless the authorization is terminated or revoked.     Resp Syncytial Virus by PCR NEGATIVE NEGATIVE    Comment: (NOTE) Fact Sheet for Patients: BloggerCourse.com  Fact Sheet for Healthcare Providers: SeriousBroker.it  This test is not yet approved or cleared by the [place] FDA and has been authorized for detection and/or diagnosis of SARS-CoV-2 by FDA under an Emergency Use Authorization (EUA). This EUA will remain in effect (meaning this test can be used) for the duration of the COVID-19 declaration under Section 564(b)(1) of the Act, 21 U.S.C. section 360bbb-3(b)(1), unless the authorization is terminated or revoked.  Performed at [facility], [address] [place], [place] [address]   Blood gas, venous (at [facility])     Status: Abnormal   Collection Time: 01/16/23  5:49 PM  Result Value Ref Range   pH, Ven 7.3 7.25 - 7.43   pCO2, Ven 35 (L) 44 - 60 mmHg   pO2, Ven 50 (H) 32 - 45 mmHg   Bicarbonate 17.2 (L) 20.0 - 28.0 mmol/L   Acid-base deficit 8.4 (H) 0.0 - 2.0 mmol/L   O2 Saturation 83.4 %   Patient temperature 37.0     Comment: Performed at [facility], [address], [place], [place] [address]  Urinalysis, Routine w reflex microscopic -Urine, Clean Catch     Status: Abnormal   Collection Time: 01/16/23  6:07 PM  Result Value Ref Range   Color, Urine YELLOW YELLOW   APPearance CLEAR CLEAR   Specific  Gravity, Urine 1.015 1.005 - 1.030   pH 6.0 5.0 - 8.0   Glucose, UA NEGATIVE NEGATIVE mg/dL   Hgb urine dipstick NEGATIVE NEGATIVE   Bilirubin Urine NEGATIVE NEGATIVE   Ketones, ur NEGATIVE NEGATIVE mg/dL   Protein, ur 30 (A) NEGATIVE mg/dL   Nitrite NEGATIVE NEGATIVE   Leukocytes,Ua NEGATIVE NEGATIVE   RBC / HPF 6-10 0 - 5 RBC/hpf   WBC, UA 0-5 0 - 5 WBC/hpf   Bacteria, UA NONE SEEN NONE SEEN   Squamous Epithelial / HPF 11-20 0 - 5  /HPF   Mucus PRESENT     Comment: Performed at [facility], [address], [place], [place] [address]  Rapid urine drug screen (hospital performed)     Status: Abnormal   Collection Time: 01/16/23  6:07 PM  Result Value Ref Range   Opiates POSITIVE (A) NONE DETECTED   Cocaine NONE DETECTED NONE DETECTED   Benzodiazepines NONE DETECTED NONE DETECTED   Amphetamines NONE DETECTED NONE DETECTED   Tetrahydrocannabinol POSITIVE (A) NONE DETECTED   Barbiturates NONE DETECTED NONE DETECTED    Comment: (NOTE) DRUG SCREEN FOR MEDICAL PURPOSES ONLY.  IF CONFIRMATION IS NEEDED FOR ANY PURPOSE, NOTIFY LAB WITHIN 5 DAYS.  LOWEST DETECTABLE LIMITS FOR URINE DRUG SCREEN Drug Class                     Cutoff (ng/mL) Amphetamine and metabolites    1000 Barbiturate and metabolites    200 Benzodiazepine                 200 Opiates and metabolites        300 Cocaine and metabolites        300 THC                            50 Performed at [facility], [address] [place], [place] [address]   HIV Antibody (routine testing w rflx)     Status: None   Collection Time: 01/17/23  4:32 AM  Result Value Ref Range   HIV Screen 4th Generation wRfx Non Reactive Non Reactive    Comment: Performed at [facility], [address] [place], [place] [address]  Basic metabolic panel     Status: Abnormal   Collection Time: 01/17/23  4:32 AM  Result Value Ref Range   Sodium 133 (L) 135 - 145 mmol/L    Comment: DELTA CHECK NOTED   Potassium 3.3 (L) 3.5 - 5.1 mmol/L   Chloride 112 (H) 98 - 111 mmol/L   CO2 14 (L) 22 - 32 mmol/L   Glucose, Bld 86 70 - 99 mg/dL    Comment: Glucose reference range applies only to samples taken after fasting for at least 8 hours.   BUN 10 6 - 20 mg/dL   Creatinine, Ser 8.65 0.44 - 1.00 mg/dL   Calcium 8.2 (L) 8.9 - 10.3 mg/dL   GFR, Estimated >78 >46 mL/min    Comment: (NOTE) Calculated using the CKD-EPI Creatinine Equation  (2021)    Anion gap 7 5 - 15    Comment: Performed at [facility], [address], [place], [place] [address]  Magnesium     Status: None   Collection Time: 01/17/23  4:32 AM  Result Value Ref Range   Magnesium 2.0 1.7 - 2.4 mg/dL    Comment: Performed at [facility], [address] [place], [place] [address]  CBC with Differential/Platelet     Status: Abnormal   Collection Time: 01/17/23  4:32 AM  Result Value Ref Range   WBC 12.1 (H) 4.0 - 10.5 K/uL   RBC 3.76 (L) 3.87 - 5.11 MIL/uL   Hemoglobin 11.8 (L) 12.0 - 15.0 g/dL   HCT 65.7 84.6 - 96.2 %   MCV 97.9 80.0 - 100.0 fL   MCH 31.4 26.0 - 34.0 pg   MCHC 32.1 30.0 - 36.0 g/dL   RDW 95.2 84.1 - 32.4 %   Platelets 254 150 - 400 K/uL   nRBC 0.0 0.0 - 0.2 %   Neutrophils Relative % 78 %   Neutro Abs 9.4 (H) 1.7 - 7.7 K/uL   Lymphocytes Relative 15 %   Lymphs Abs 1.8 0.7 - 4.0 K/uL   Monocytes Relative 5 %   Monocytes Absolute 0.6 0.1 - 1.0 K/uL   Eosinophils Relative 1 %   Eosinophils Absolute 0.1 0.0 - 0.5 K/uL   Basophils Relative 0 %   Basophils Absolute 0.0 0.0 - 0.1 K/uL   Immature Granulocytes 1 %   Abs Immature Granulocytes 0.07 0.00 - 0.07 K/uL    Comment: Performed at [facility], [address] [place], [place] [address]  Basic metabolic panel     Status: Abnormal   Collection Time: 01/17/23 12:25 PM  Result Value Ref Range   Sodium 137 135 - 145 mmol/L   Potassium 3.6 3.5 - 5.1 mmol/L   Chloride 113 (H) 98 - 111 mmol/L   CO2 14 (L) 22 - 32 mmol/L   Glucose, Bld 97 70 - 99 mg/dL    Comment: Glucose reference range applies only to samples taken after fasting for at least 8 hours.   BUN 8 6 - 20 mg/dL   Creatinine, Ser 7.25 0.44 - 1.00 mg/dL   Calcium 8.4 (L) 8.9 - 10.3 mg/dL   GFR, Estimated >36 >64 mL/min    Comment: (NOTE) Calculated using the CKD-EPI Creatinine Equation (2021)    Anion gap 10 5 - 15    Comment: Performed at [facility], [address], [place], [place] [address]    CT ABDOMEN PELVIS W CONTRAST  Result Date: 01/16/2023 CLINICAL DATA:  Abdominal pain, vomiting EXAM: CT ABDOMEN AND PELVIS WITH CONTRAST TECHNIQUE: Multidetector CT imaging of the abdomen and pelvis was performed using the standard protocol following bolus administration of intravenous contrast. RADIATION DOSE REDUCTION: This exam was performed according to the departmental dose-optimization program which includes automated exposure control, adjustment of the mA and/or kV according to patient size and/or use of iterative reconstruction technique. CONTRAST:  OMNIPAQUE IOHEXOL 300 MG/ML  SOLN COMPARISON:  10/07/2022 FINDINGS: Lower chest: Visualized lower lung fields are clear. Hepatobiliary: Surgical clips are seen in gallbladder fossa. There is no significant dilation of bile ducts. No focal abnormalities are seen in liver. Pancreas: There is 9 mm low-density structure in the tail of pancreas with no interval change. The lesion has been seen in multiple previous studies dating as far back as 02/20/2011. Spleen: Unremarkable. Adrenals/Urinary Tract: Adrenals are unremarkable. There is no hydronephrosis. There are no renal or ureteral stones. Urinary bladder is unremarkable. Stomach/Bowel: Small hiatal hernia is seen. Stomach is not distended. Small bowel loops are not dilated. The appendix measures up to 8 mm in diameter. There are pockets of air and fluid in the lumen of the appendix. There is no pericecal inflammation. There is  mild thickening in hepatic flexure and transverse colon. Scattered diverticula are seen in colon without signs of focal acute diverticulitis. There is mild mucosal enhancement in rectosigmoid. Vascular/Lymphatic: Unremarkable. Reproductive: Uterus is not seen. Other: There is no ascites or pneumoperitoneum. Musculoskeletal: Decrease in height of upper endplate of body of L1 vertebra has not changed. IMPRESSION:  There is no evidence of intestinal obstruction or pneumoperitoneum. There is no hydronephrosis. There is mild diffuse wall thickening in the hepatic flexure and transverse colon which may suggest acute on chronic nonspecific inflammation. There is mild diffuse mucosal enhancement in rectosigmoid suggesting possible nonspecific inflammation. There are no loculated pericolic fluid collections. Scattered diverticula seen in the colon without signs of focal acute diverticulitis. Appendix is slightly more prominent than usual measuring up to 8 mm in diameter without significant pericolic stranding. This may be a normal variation or suggest early appendicitis. If there are continued symptoms in right lower quadrant, short-term follow-up CT in 1-2 days may be considered. Status post cholecystectomy. There is 9 mm low-density lesion in tail of pancreas with no significant interval change. Electronically Signed   By: Ernie Avena M.D.   On: 01/16/2023 17:07    ROS negative except above she does say she usually has headaches with the spells and she does not have one now and she had been on Elavil years ago which did not work Blood pressure (!) 147/95, pulse 77, temperature 98.4 F (36.9 C), resp. rate 18, height 5' 3.39" (1.61 m), weight 68 kg, last menstrual period 10/19/2019, SpO2 100 %. Physical Exam vital signs stable afebrile no acute distress mild tenderness throughout without any rebound or significant guarding CT reviewed chemistry is okay white count decreased from 23-12  Assessment/Plan: Nausea vomiting abdominal pain and diarrhea in patient with long history of cyclic vomiting versus marijuana induced issues Plan: Await a.m. CBC and stool pathogen panel and will check on tomorrow and consider repeat CT as recommended by radiology just to be sure particularly if question of appendicitis continues and might require partial colonoscopy versus full colonoscopy depending on clinical course  Jacquise Rarick  E 01/17/2023, 2:13 PM

## 2023-01-18 ENCOUNTER — Other Ambulatory Visit (HOSPITAL_COMMUNITY): Payer: Self-pay

## 2023-01-18 DIAGNOSIS — R1110 Vomiting, unspecified: Secondary | ICD-10-CM | POA: Diagnosis not present

## 2023-01-18 DIAGNOSIS — R109 Unspecified abdominal pain: Secondary | ICD-10-CM | POA: Diagnosis not present

## 2023-01-18 LAB — BASIC METABOLIC PANEL
Anion gap: 8 (ref 5–15)
Anion gap: 8 (ref 5–15)
BUN: <5 mg/dL — ABNORMAL LOW (ref 6–20)
BUN: <5 mg/dL — ABNORMAL LOW (ref 6–20)
CO2: 14 mmol/L — ABNORMAL LOW (ref 22–32)
CO2: 18 mmol/L — ABNORMAL LOW (ref 22–32)
Calcium: 8.2 mg/dL — ABNORMAL LOW (ref 8.9–10.3)
Calcium: 8.8 mg/dL — ABNORMAL LOW (ref 8.9–10.3)
Chloride: 112 mmol/L — ABNORMAL HIGH (ref 98–111)
Chloride: 111 mmol/L (ref 98–111)
Creatinine, Ser: 0.8 mg/dL (ref 0.44–1.00)
Creatinine, Ser: 0.81 mg/dL (ref 0.44–1.00)
GFR, Estimated: >60 mL/min (ref >60)
GFR, Estimated: >60 mL/min (ref >60)
Glucose, Bld: 93 mg/dL (ref 70–99)
Glucose, Bld: 89 mg/dL (ref 70–99)
Potassium: 3.5 mmol/L (ref 3.5–5.1)
Potassium: 3.8 mmol/L (ref 3.5–5.1)
Sodium: 134 mmol/L — ABNORMAL LOW (ref 135–145)
Sodium: 137 mmol/L (ref 135–145)

## 2023-01-18 LAB — GASTROINTESTINAL PANEL BY PCR, STOOL (REPLACES STOOL CULTURE)

## 2023-01-18 LAB — CBC WITH DIFFERENTIAL/PLATELET
Abs Immature Granulocytes: 0.05 10*3/uL (ref 0.00–0.07)
Basophils Absolute: 0 10*3/uL (ref 0.0–0.1)
Basophils Relative: 1 %
Eosinophils Absolute: 0.2 10*3/uL (ref 0.0–0.5)
Eosinophils Relative: 3 %
HCT: 35.9 % — ABNORMAL LOW (ref 36.0–46.0)
Hemoglobin: 12.1 g/dL (ref 12.0–15.0)
Immature Granulocytes: 1 %
Lymphocytes Relative: 18 %
Lymphs Abs: 1.4 10*3/uL (ref 0.7–4.0)
MCH: 32 pg (ref 26.0–34.0)
MCHC: 33.7 g/dL (ref 30.0–36.0)
MCV: 95 fL (ref 80.0–100.0)
Monocytes Absolute: 0.6 10*3/uL (ref 0.1–1.0)
Monocytes Relative: 7 %
Neutro Abs: 5.4 10*3/uL (ref 1.7–7.7)
Neutrophils Relative %: 70 %
Platelets: 242 10*3/uL (ref 150–400)
RBC: 3.78 MIL/uL — ABNORMAL LOW (ref 3.87–5.11)
RDW: 13.4 % (ref 11.5–15.5)
WBC: 7.6 10*3/uL (ref 4.0–10.5)
nRBC: 0 % (ref 0.0–0.2)

## 2023-01-18 MED ORDER — SODIUM BICARBONATE 8.4 % IV SOLN
INTRAVENOUS | Status: DC
  Filled 2023-01-18 (×3): qty 150
  Filled 2023-01-18: qty 1000

## 2023-01-18 MED ORDER — ADULT MULTIVITAMIN W/MINERALS CH
1.0000 | ORAL_TABLET | Freq: Every day | ORAL | Status: DC
  Administered 2023-01-19 – 2023-01-20 (×2): 1 via ORAL
  Filled 2023-01-18 (×2): qty 1

## 2023-01-18 MED ORDER — POTASSIUM CHLORIDE CRYS ER 20 MEQ PO TBCR
40.0000 meq | EXTENDED_RELEASE_TABLET | Freq: Once | ORAL | Status: AC
  Administered 2023-01-18: 40 meq via ORAL
  Filled 2023-01-18: qty 2

## 2023-01-18 MED ORDER — POTASSIUM CHLORIDE CRYS ER 20 MEQ PO TBCR
20.0000 meq | EXTENDED_RELEASE_TABLET | Freq: Once | ORAL | Status: AC
  Administered 2023-01-18: 20 meq via ORAL
  Filled 2023-01-18: qty 1

## 2023-01-18 NOTE — Progress Notes (Signed)
Sherry Key 2:44 PM  Subjective: Patient doing better with less pain no nausea vomiting wants to eat and did have 1 loose stool today and she does tell me she had C. difficile years ago but she does not remember the last time she got an antibiotic although she may have gotten an antibiotic shot when she was in the ER last which was a month or 2 ago and she has no other complaints  Objective: Vital signs stable afebrile no acute distress abdomen is softer nontender chemistry is okay white count improved GI pathogen panel negative  Assessment: Probable infectious colitis currently improved  Plan: Slowly advance diet would treat with oral Vanco for 10 days and she can follow-up either with her primary gastroenterologist in her hometown or her primary care physician in a few weeks as an outpatient to recheck symptoms and see if repeat C. difficile stool study is needed to document healing and eradication and hopefully she can go home soon  [facility] E  office [phone] After 5PM or if no answer call [phone]

## 2023-01-18 NOTE — Progress Notes (Signed)
PROGRESS NOTE    Sherry Key  WUJ:[id number] DOB: Jan 24, 1986 DOA: 01/16/2023 PCP: Pcp, No   Brief Narrative: 37 year old with past medical history significant for hypertension, chronic cannabis use, cyclic vomiting syndrome presents complaining of abdominal pain, nausea, vomiting and generalized malaise.  Previous vomiting episode last week.  Evaluation in the ED:  CT abdomen and pelvis notable for mild diffuse wall thickening of the hepatic flexure and transverse colon, slightly prominent than expected appendix.  Bicarb at 14, white blood cell 23.    Assessment & Plan:   Principal Problem:   Abdominal pain with vomiting Active Problems:   Leukocytosis   1--Infectious Colitis, C diff.  Abdominal pain, nausea vomiting, diarrhea -CT colonic wall thickening and prominent appendix -Lipase 26 -C Diff antigen and PCR positive. and GI pathogen negative.  -Surgery consulted, no concern for appendicitis.  -GI consulted. Recommend treatment for C diff colitis 10 days of Vancomycin.  -Discontinue Ceftriaxone and Flagyl.  -Plan to advanced diet.   2-Leukocytosis:  Concern for colitis, CT scan colonic wall thickening.  Continue with IV antibiotics.  UA negative Resolved.   3-Metabolic acidosis: In setting of Diarrhea, vomiting.  Continue with IV fluids. Change to Bicarb.  Hold Diamox.   Hypokalemia; Replete orally times 2.   Idiopathic Intracranial Hypertension:  She was diagnosed on  10/2022. Had MRI and LP 10/2022. She was started on Topamax and Diamox.  Will continue with Topamax 25 mg BID.  Discussed with neurology, ok to hold Diamox in setting of metabolic acidosis. Resume at discharge, she will need close follow up with [facility] to follow for acidosis.    Estimated body mass index is 26.23 kg/m as calculated from the following:   Height as of this encounter: 5' 3.39" (1.61 m).   Weight as of this encounter: 68 kg.   DVT prophylaxis: Lovenox Code Status: Full  code Family Communication: acre discussed with patient.  Disposition Plan:  Status is: Observation The patient remains OBS appropriate and will d/c before 2 midnights.    Consultants:  GI Surgery   Procedures:    Antimicrobials:    Subjective: Report improvement of abdominal pain, tolerating clear liquid diet.   Objective: Vitals:   01/17/23 1824 01/17/23 1939 01/18/23 0446 01/18/23 1243  BP: 115/89 (!) 126/93 131/82 (!) 152/107  Pulse: 75 69 81 (!) 58  Resp: 16 16 17    Temp: 98.4 F (36.9 C) 97.7 F (36.5 C) 98.2 F (36.8 C) 98.3 F (36.8 C)  TempSrc:      SpO2: 100% 100% 100% 100%  Weight:      Height:        Intake/Output Summary (Last 24 hours) at 01/18/2023 1609 Last data filed at 01/18/2023 9562 Gross per 24 hour  Intake 1933.4 ml  Output --  Net 1933.4 ml   Filed Weights   01/16/23 2246  Weight: 68 kg    Examination:  General exam: NAD Respiratory system: CTA Cardiovascular system: S 1, S 2 RRR Gastrointestinal system: BS present, soft, nt Central nervous system: Alert, oriented.  Extremities: Symmetric 5 x 5 power.    Data Reviewed: I have personally reviewed following labs and imaging studies  CBC: Recent Labs  Lab 01/16/23 1448 01/17/23 0432 01/18/23 0414  WBC 23.2* 12.1* 7.6  NEUTROABS 20.9* 9.4* 5.4  HGB 13.6 11.8* 12.1  HCT 42.6 36.8 35.9*  MCV 97.7 97.9 95.0  PLT 282 254 242   Basic Metabolic Panel: Recent Labs  Lab 01/16/23 1448 01/17/23 0432 01/17/23  1225 01/18/23 0414 01/18/23 1215  NA 140 133* 137 134* 137  K 3.4* 3.3* 3.6 3.5 3.8  CL 117* 112* 113* 112* 111  CO2 14* 14* 14* 14* 18*  GLUCOSE 122* 86 97 93 89  BUN 15 10 8  <5* <5*  CREATININE 0.76 0.67 0.78 0.80 0.81  CALCIUM 9.1 8.2* 8.4* 8.2* 8.8*  MG 2.1 2.0  --   --   --    GFR: Estimated Creatinine Clearance: 88.9 mL/min (by C-G formula based on SCr of 0.81 mg/dL). Liver Function Tests: Recent Labs  Lab 01/16/23 1448  AST 14*  ALT 14  ALKPHOS 74   BILITOT 0.5  PROT 8.4*  ALBUMIN 4.5   Recent Labs  Lab 01/16/23 1448  LIPASE 26   No results for input(s): "AMMONIA" in the last 168 hours. Coagulation Profile: No results for input(s): "INR", "PROTIME" in the last 168 hours. Cardiac Enzymes: No results for input(s): "CKTOTAL", "CKMB", "CKMBINDEX", "TROPONINI" in the last 168 hours. BNP (last 3 results) No results for input(s): "PROBNP" in the last 8760 hours. HbA1C: No results for input(s): "HGBA1C" in the last 72 hours. CBG: No results for input(s): "GLUCAP" in the last 168 hours. Lipid Profile: No results for input(s): "CHOL", "HDL", "LDLCALC", "TRIG", "CHOLHDL", "LDLDIRECT" in the last 72 hours. Thyroid Function Tests: No results for input(s): "TSH", "T4TOTAL", "FREET4", "T3FREE", "THYROIDAB" in the last 72 hours. Anemia Panel: No results for input(s): "VITAMINB12", "FOLATE", "FERRITIN", "TIBC", "IRON", "RETICCTPCT" in the last 72 hours. Sepsis Labs: No results for input(s): "PROCALCITON", "LATICACIDVEN" in the last 168 hours.  Recent Results (from the past 240 hour(s))  Resp panel by RT-PCR (RSV, Flu A&B, Covid) Anterior Nasal Swab     Status: None   Collection Time: 01/16/23  3:07 PM   Specimen: Anterior Nasal Swab  Result Value Ref Range Status   SARS Coronavirus 2 by RT PCR NEGATIVE NEGATIVE Final    Comment: (NOTE) SARS-CoV-2 target nucleic acids are NOT DETECTED.  The SARS-CoV-2 RNA is generally detectable in upper respiratory specimens during the acute phase of infection. The lowest concentration of SARS-CoV-2 viral copies this assay can detect is 138 copies/mL. A negative result does not preclude SARS-Cov-2 infection and should not be used as the sole basis for treatment or other patient management decisions. A negative result may occur with  improper specimen collection/handling, submission of specimen other than nasopharyngeal swab, presence of viral mutation(s) within the areas targeted by this assay,  and inadequate number of viral copies(<138 copies/mL). A negative result must be combined with clinical observations, patient history, and epidemiological information. The expected result is Negative.  Fact Sheet for Patients:  BloggerCourse.com  Fact Sheet for Healthcare Providers:  SeriousBroker.it  This test is no t yet approved or cleared by the [place] FDA and  has been authorized for detection and/or diagnosis of SARS-CoV-2 by FDA under an Emergency Use Authorization (EUA). This EUA will remain  in effect (meaning this test can be used) for the duration of the COVID-19 declaration under Section 564(b)(1) of the Act, 21 U.S.C.section 360bbb-3(b)(1), unless the authorization is terminated  or revoked sooner.       Influenza A by PCR NEGATIVE NEGATIVE Final   Influenza B by PCR NEGATIVE NEGATIVE Final    Comment: (NOTE) The Xpert Xpress SARS-CoV-2/FLU/RSV plus assay is intended as an aid in the diagnosis of influenza from Nasopharyngeal swab specimens and should not be used as a sole basis for treatment. Nasal washings and aspirates are unacceptable  for Xpert Xpress SARS-CoV-2/FLU/RSV testing.  Fact Sheet for Patients: BloggerCourse.com  Fact Sheet for Healthcare Providers: SeriousBroker.it  This test is not yet approved or cleared by the [place] FDA and has been authorized for detection and/or diagnosis of SARS-CoV-2 by FDA under an Emergency Use Authorization (EUA). This EUA will remain in effect (meaning this test can be used) for the duration of the COVID-19 declaration under Section 564(b)(1) of the Act, 21 U.S.C. section 360bbb-3(b)(1), unless the authorization is terminated or revoked.     Resp Syncytial Virus by PCR NEGATIVE NEGATIVE Final    Comment: (NOTE) Fact Sheet for Patients: BloggerCourse.com  Fact Sheet for  Healthcare Providers: SeriousBroker.it  This test is not yet approved or cleared by the [place] FDA and has been authorized for detection and/or diagnosis of SARS-CoV-2 by FDA under an Emergency Use Authorization (EUA). This EUA will remain in effect (meaning this test can be used) for the duration of the COVID-19 declaration under Section 564(b)(1) of the Act, 21 U.S.C. section 360bbb-3(b)(1), unless the authorization is terminated or revoked.  Performed at [facility], [address], [place], [place] [address]   C Difficile Quick Screen w PCR reflex     Status: Abnormal   Collection Time: 01/17/23 12:45 PM   Specimen: Stool  Result Value Ref Range Status   C Diff antigen POSITIVE (A) NEGATIVE Final   C Diff toxin NEGATIVE NEGATIVE Final   C Diff interpretation Results are indeterminate. See PCR results.  Final    Comment: Performed at [facility], [address], [place], [place] [address]  Gastrointestinal Panel by PCR , Stool     Status: None   Collection Time: 01/17/23 12:45 PM   Specimen: Stool  Result Value Ref Range Status   Campylobacter species NOT DETECTED NOT DETECTED Final   Plesimonas shigelloides NOT DETECTED NOT DETECTED Final   Salmonella species NOT DETECTED NOT DETECTED Final   Yersinia enterocolitica NOT DETECTED NOT DETECTED Final   Vibrio species NOT DETECTED NOT DETECTED Final   Vibrio cholerae NOT DETECTED NOT DETECTED Final   Enteroaggregative E coli (EAEC) NOT DETECTED NOT DETECTED Final   Enteropathogenic E coli (EPEC) NOT DETECTED NOT DETECTED Final   Enterotoxigenic E coli (ETEC) NOT DETECTED NOT DETECTED Final   Shiga like toxin producing E coli (STEC) NOT DETECTED NOT DETECTED Final   Shigella/Enteroinvasive E coli (EIEC) NOT DETECTED NOT DETECTED Final   Cryptosporidium NOT DETECTED NOT DETECTED Final   Cyclospora cayetanensis NOT DETECTED NOT DETECTED Final   Entamoeba  histolytica NOT DETECTED NOT DETECTED Final   Giardia lamblia NOT DETECTED NOT DETECTED Final   Adenovirus F40/41 NOT DETECTED NOT DETECTED Final   Astrovirus NOT DETECTED NOT DETECTED Final   Norovirus GI/GII NOT DETECTED NOT DETECTED Final   Rotavirus A NOT DETECTED NOT DETECTED Final   Sapovirus (I, II, IV, and V) NOT DETECTED NOT DETECTED Final    Comment: Performed at [facility], [address] [place], [place] [address]  C. Diff by PCR, Reflexed     Status: Abnormal   Collection Time: 01/17/23 12:45 PM  Result Value Ref Range Status   Toxigenic C. Difficile by PCR POSITIVE (A) NEGATIVE Final    Comment: Positive for toxigenic C. difficile with little to no toxin production. Only treat if clinical presentation suggests symptomatic illness. Performed at [facility], [address], [place], [place] [address]  Radiology Studies: CT ABDOMEN PELVIS W CONTRAST  Result Date: 01/16/2023 CLINICAL DATA:  Abdominal pain, vomiting EXAM: CT ABDOMEN AND PELVIS WITH CONTRAST TECHNIQUE: Multidetector CT imaging of the abdomen and pelvis was performed using the standard protocol following bolus administration of intravenous contrast. RADIATION DOSE REDUCTION: This exam was performed according to the departmental dose-optimization program which includes automated exposure control, adjustment of the mA and/or kV according to patient size and/or use of iterative reconstruction technique. CONTRAST:  OMNIPAQUE IOHEXOL 300 MG/ML  SOLN COMPARISON:  10/07/2022 FINDINGS: Lower chest: Visualized lower lung fields are clear. Hepatobiliary: Surgical clips are seen in gallbladder fossa. There is no significant dilation of bile ducts. No focal abnormalities are seen in liver. Pancreas: There is 9 mm low-density structure in the tail of pancreas with no interval change. The lesion has been seen in multiple previous studies dating as far back as 02/20/2011. Spleen: Unremarkable.  Adrenals/Urinary Tract: Adrenals are unremarkable. There is no hydronephrosis. There are no renal or ureteral stones. Urinary bladder is unremarkable. Stomach/Bowel: Small hiatal hernia is seen. Stomach is not distended. Small bowel loops are not dilated. The appendix measures up to 8 mm in diameter. There are pockets of air and fluid in the lumen of the appendix. There is no pericecal inflammation. There is mild thickening in hepatic flexure and transverse colon. Scattered diverticula are seen in colon without signs of focal acute diverticulitis. There is mild mucosal enhancement in rectosigmoid. Vascular/Lymphatic: Unremarkable. Reproductive: Uterus is not seen. Other: There is no ascites or pneumoperitoneum. Musculoskeletal: Decrease in height of upper endplate of body of L1 vertebra has not changed. IMPRESSION: There is no evidence of intestinal obstruction or pneumoperitoneum. There is no hydronephrosis. There is mild diffuse wall thickening in the hepatic flexure and transverse colon which may suggest acute on chronic nonspecific inflammation. There is mild diffuse mucosal enhancement in rectosigmoid suggesting possible nonspecific inflammation. There are no loculated pericolic fluid collections. Scattered diverticula seen in the colon without signs of focal acute diverticulitis. Appendix is slightly more prominent than usual measuring up to 8 mm in diameter without significant pericolic stranding. This may be a normal variation or suggest early appendicitis. If there are continued symptoms in right lower quadrant, short-term follow-up CT in 1-2 days may be considered. Status post cholecystectomy. There is 9 mm low-density lesion in tail of pancreas with no significant interval change. Electronically Signed   By: Ernie Avena M.D.   On: 01/16/2023 17:07        Scheduled Meds:  enoxaparin (LOVENOX) injection  40 mg Subcutaneous Q24H   feeding supplement  1 Container Oral TID BM   [START ON  01/19/2023] multivitamin with minerals  1 tablet Oral Daily   pantoprazole (PROTONIX) IV  40 mg Intravenous Q12H   topiramate  25 mg Oral BID   vancomycin  125 mg Oral Q6H   Continuous Infusions:  promethazine (PHENERGAN) injection (IM or IVPB) Stopped (01/17/23 1909)   sodium bicarbonate 150 mEq in dextrose 5 % 1,150 mL infusion 100 mL/hr at 01/18/23 0641     LOS: 1 day    Time spent: 35 minutes    Stavroula Rohde A Taran Haynesworth, MD [facility]   If 7PM-7AM, please contact night-coverage [email or web address]  01/18/2023, 4:09 PM

## 2023-01-18 NOTE — Progress Notes (Signed)
Initial Nutrition Assessment  DOCUMENTATION CODES:   Not applicable  INTERVENTION:  - Clear Liquid diet per MD.  - Boost Breeze po TID, each supplement provides 250 kcal and 9 grams of protein - Encourage intake as tolerated.  - Multivitamin with minerals daily. - Monitor weight trends.   NUTRITION DIAGNOSIS:   Increased nutrient needs related to acute illness as evidenced by estimated needs.  GOAL:   Patient will meet greater than or equal to 90% of their needs  MONITOR:   PO intake, Supplement acceptance, Diet advancement, Weight trends  REASON FOR ASSESSMENT:   Malnutrition Screening Tool    ASSESSMENT:   37 y.o. 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  Patient reports a UBW between 140-160# and states her weight tends to fluctuate up and down. No major changes outside normal fluctuations recently.   She reports eating 1 large meal and several snacks daily. Appetite has been decreased recently due to changes in medications. She also notes she doesn't have a good appetite unless she takes marijuana. Has not been eating well the past ~2 weeks due to ongoing N/V and abdominal pain.  Current appetite is okay. Patient states she is tired of the clear liquid diet and wishes to advance. No meal intakes documented. Agreeable to try Boost Breeze today to support intake.  Medications reviewed and include: Phenergan prn  Labs reviewed:  Na 134   NUTRITION - FOCUSED PHYSICAL EXAM:  Flowsheet Row Most Recent Value  Orbital Region No depletion  Upper Arm Region No depletion  Thoracic and Lumbar Region No depletion  Buccal Region No depletion  Temple Region No depletion  Clavicle Bone Region No depletion  Clavicle and Acromion Bone Region No depletion  Scapular Bone Region Unable to assess  Dorsal Hand No  depletion  Patellar Region No depletion  Anterior Thigh Region No depletion  Posterior Calf Region No depletion  Edema (RD Assessment) None  Hair Reviewed  Eyes Reviewed  Mouth Reviewed  Skin Reviewed  Nails Reviewed       Diet Order:   Diet Order             Diet clear liquid Fluid consistency: Thin  Diet effective now                   EDUCATION NEEDS:  Education needs have been addressed  Skin:  Skin Assessment: Reviewed RN Assessment  Last BM:  7/10  Height:  Ht Readings from Last 1 Encounters:  01/16/23 5' 3.39" (1.61 m)   Weight: Wt Readings from Last 1 Encounters:  01/16/23 68 kg    BMI:  Body mass index is 26.23 kg/m.  Estimated Nutritional Needs:  Kcal:  1850-2000 kcals Protein:  70-80 grams Fluid:  >/= 1.8L    Shelle Iron RD, LDN For contact information, refer to [facility].

## 2023-01-18 NOTE — TOC Benefit Eligibility Note (Signed)
Pharmacy Patient Advocate Encounter  Insurance verification completed.    The patient is insured through [employer] [place]   Ran test claim for Vancomycin 125 mg Capsules and the current 30 day co-pay is $4.00.   This test claim was processed through [facility]- copay amounts may vary at other pharmacies due to pharmacy/plan contracts, or as the patient moves through the different stages of their insurance plan.    Roland Earl, CPHT Pharmacy Patient Advocate Specialist [facility] Patient Advocate Team Direct Number: [phone]  Fax: [phone]

## 2023-01-18 NOTE — Progress Notes (Signed)
Subjective: CC: Main complaint is diarrhea. No melena or hematochezia. Left sided abdominal pain has resolved. Pain now in her central and right abdomen diffusely. Nausea and vomiting resolved. Tolerating cld without worsening on her symptoms.   C. Diff Antigen and Toxigenic C. Diff PCR positive. WBC normalized.   Objective: Vital signs in last 24 hours: Temp:  [97.7 F (36.5 C)-98.4 F (36.9 C)] 98.2 F (36.8 C) (07/11 0446) Pulse Rate:  [69-81] 81 (07/11 0446) Resp:  [16-18] 17 (07/11 0446) BP: (115-147)/(82-95) 131/82 (07/11 0446) SpO2:  [100 %] 100 % (07/11 0446) Last BM Date : 01/17/23  Intake/Output from previous day: 07/10 0701 - 07/11 0700 In: 1933.4 [I.V.:1583.3; IV Piggyback:350.2] Out: -  Intake/Output this shift: No intake/output data recorded.  PE: Gen:  Alert, NAD, pleasant Abd: Soft, ND, diffusely tender across her right abdomen, epigastrium, periumbilical and suprapubic abdomen. Appears to also have some more mild left sided abdominal ttp on exam.   Lab Results:  Recent Labs    01/17/23 0432 01/18/23 0414  WBC 12.1* 7.6  HGB 11.8* 12.1  HCT 36.8 35.9*  PLT 254 242   BMET Recent Labs    01/17/23 1225 01/18/23 0414  NA 137 134*  K 3.6 3.5  CL 113* 112*  CO2 14* 14*  GLUCOSE 97 93  BUN 8 <5*  CREATININE 0.78 0.80  CALCIUM 8.4* 8.2*   PT/INR No results for input(s): "LABPROT", "INR" in the last 72 hours. CMP     Component Value Date/Time   NA 134 (L) 01/18/2023 0414   K 3.5 01/18/2023 0414   CL 112 (H) 01/18/2023 0414   CO2 14 (L) 01/18/2023 0414   GLUCOSE 93 01/18/2023 0414   BUN <5 (L) 01/18/2023 0414   CREATININE 0.80 01/18/2023 0414   CALCIUM 8.2 (L) 01/18/2023 0414   PROT 8.4 (H) 01/16/2023 1448   ALBUMIN 4.5 01/16/2023 1448   AST 14 (L) 01/16/2023 1448   ALT 14 01/16/2023 1448   ALKPHOS 74 01/16/2023 1448   BILITOT 0.5 01/16/2023 1448   GFRNONAA >60 01/18/2023 0414   GFRAA >60 01/20/2020 0643   Lipase      Component Value Date/Time   LIPASE 26 01/16/2023 1448    Studies/Results: CT ABDOMEN PELVIS W CONTRAST  Result Date: 01/16/2023 CLINICAL DATA:  Abdominal pain, vomiting EXAM: CT ABDOMEN AND PELVIS WITH CONTRAST TECHNIQUE: Multidetector CT imaging of the abdomen and pelvis was performed using the standard protocol following bolus administration of intravenous contrast. RADIATION DOSE REDUCTION: This exam was performed according to the departmental dose-optimization program which includes automated exposure control, adjustment of the mA and/or kV according to patient size and/or use of iterative reconstruction technique. CONTRAST:  OMNIPAQUE IOHEXOL 300 MG/ML  SOLN COMPARISON:  10/07/2022 FINDINGS: Lower chest: Visualized lower lung fields are clear. Hepatobiliary: Surgical clips are seen in gallbladder fossa. There is no significant dilation of bile ducts. No focal abnormalities are seen in liver. Pancreas: There is 9 mm low-density structure in the tail of pancreas with no interval change. The lesion has been seen in multiple previous studies dating as far back as 02/20/2011. Spleen: Unremarkable. Adrenals/Urinary Tract: Adrenals are unremarkable. There is no hydronephrosis. There are no renal or ureteral stones. Urinary bladder is unremarkable. Stomach/Bowel: Small hiatal hernia is seen. Stomach is not distended. Small bowel loops are not dilated. The appendix measures up to 8 mm in diameter. There are pockets of air and fluid in the lumen of the appendix.  There is no pericecal inflammation. There is mild thickening in hepatic flexure and transverse colon. Scattered diverticula are seen in colon without signs of focal acute diverticulitis. There is mild mucosal enhancement in rectosigmoid. Vascular/Lymphatic: Unremarkable. Reproductive: Uterus is not seen. Other: There is no ascites or pneumoperitoneum. Musculoskeletal: Decrease in height of upper endplate of body of L1 vertebra has not changed.  IMPRESSION: There is no evidence of intestinal obstruction or pneumoperitoneum. There is no hydronephrosis. There is mild diffuse wall thickening in the hepatic flexure and transverse colon which may suggest acute on chronic nonspecific inflammation. There is mild diffuse mucosal enhancement in rectosigmoid suggesting possible nonspecific inflammation. There are no loculated pericolic fluid collections. Scattered diverticula seen in the colon without signs of focal acute diverticulitis. Appendix is slightly more prominent than usual measuring up to 8 mm in diameter without significant pericolic stranding. This may be a normal variation or suggest early appendicitis. If there are continued symptoms in right lower quadrant, short-term follow-up CT in 1-2 days may be considered. Status post cholecystectomy. There is 9 mm low-density lesion in tail of pancreas with no significant interval change. Electronically Signed   By: Ernie Avena M.D.   On: 01/16/2023 17:07    Anti-infectives: Anti-infectives (From admission, onward)    Start     Dose/Rate Route Frequency Ordered Stop   01/17/23 2015  cefTRIAXone (ROCEPHIN) 2 g in sodium chloride 0.9 % 100 mL IVPB        2 g 200 mL/hr over 30 Minutes Intravenous Every 24 hours 01/16/23 2058     01/17/23 1900  vancomycin (VANCOCIN) capsule 125 mg        125 mg Oral Every 6 hours 01/17/23 1813     01/17/23 1000  metroNIDAZOLE (FLAGYL) IVPB 500 mg        500 mg 100 mL/hr over 60 Minutes Intravenous Every 12 hours 01/16/23 2058     01/16/23 2015  cefTRIAXone (ROCEPHIN) 2 g in sodium chloride 0.9 % 100 mL IVPB       Placed in "And" Linked Group   2 g 200 mL/hr over 30 Minutes Intravenous  Once 01/16/23 2001 01/16/23 2143   01/16/23 2015  metroNIDAZOLE (FLAGYL) IVPB 500 mg       Placed in "And" Linked Group   500 mg 100 mL/hr over 60 Minutes Intravenous  Once 01/16/23 2001 01/16/23 2340        Assessment/Plan This is a 37 y.o. 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  Since presentation she reports she now also has right sided abdominal pain. Her imaging showed mild diffuse wall thickening of the hepatic flexure and transverse colon along with mild diffuse mucosal enhancement in the rectosigmoid and a slightly more prominent appendix at 8 mm without significant stranding. There was no loculated fluid collections, free air or evidence of obstruction.   Since admission her tachycardia has resolved and her WBC has normalized. She is afebrile.  She is tolerating clears without worsening of her abdominal pain and her nausea/vomiting has resolved. Exam as above.   Her C. Diff is positive (Antigen and Toxigenic PCR). GI is already following. Defer tx to their team. Appreciate their assistance.  I do not suspect appendicitis at this time and do not recommend  any emergency surgery. Will defer further workup and tx to GI. Our team will be available as needed moving forward.    I reviewed nursing notes, Consultant (GI) notes, hospitalist notes, last 24 h vitals and pain scores, last 48 h intake and output, last 24 h labs and trends, and last 24 h imaging results.    LOS: 1 day    Jacinto Halim , [facility] 01/18/2023, 8:13 AM Please see Amion for pager number during day hours 7:00am-4:30pm

## 2023-01-19 DIAGNOSIS — R1110 Vomiting, unspecified: Secondary | ICD-10-CM | POA: Diagnosis not present

## 2023-01-19 DIAGNOSIS — R109 Unspecified abdominal pain: Secondary | ICD-10-CM | POA: Diagnosis not present

## 2023-01-19 LAB — BASIC METABOLIC PANEL
Anion gap: 7 (ref 5–15)
Anion gap: 8 (ref 5–15)
BUN: <5 mg/dL — ABNORMAL LOW (ref 6–20)
BUN: <5 mg/dL — ABNORMAL LOW (ref 6–20)
CO2: 21 mmol/L — ABNORMAL LOW (ref 22–32)
CO2: 21 mmol/L — ABNORMAL LOW (ref 22–32)
Calcium: 8.3 mg/dL — ABNORMAL LOW (ref 8.9–10.3)
Calcium: 8.7 mg/dL — ABNORMAL LOW (ref 8.9–10.3)
Chloride: 106 mmol/L (ref 98–111)
Chloride: 108 mmol/L (ref 98–111)
Creatinine, Ser: 0.88 mg/dL (ref 0.44–1.00)
Creatinine, Ser: 0.78 mg/dL (ref 0.44–1.00)
GFR, Estimated: >60 mL/min (ref >60)
GFR, Estimated: >60 mL/min (ref >60)
Glucose, Bld: 87 mg/dL (ref 70–99)
Glucose, Bld: 102 mg/dL — ABNORMAL HIGH (ref 70–99)
Potassium: 2.9 mmol/L — ABNORMAL LOW (ref 3.5–5.1)
Potassium: 3.6 mmol/L (ref 3.5–5.1)
Sodium: 134 mmol/L — ABNORMAL LOW (ref 135–145)
Sodium: 137 mmol/L (ref 135–145)

## 2023-01-19 MED ORDER — SODIUM CHLORIDE 0.9 % IV SOLN: INTRAVENOUS | Status: DC

## 2023-01-19 MED ORDER — POTASSIUM CHLORIDE CRYS ER 20 MEQ PO TBCR
40.0000 meq | EXTENDED_RELEASE_TABLET | ORAL | Status: AC
  Administered 2023-01-19 (×2): 40 meq via ORAL
  Filled 2023-01-19 (×2): qty 2

## 2023-01-19 MED ORDER — POTASSIUM CHLORIDE CRYS ER 20 MEQ PO TBCR
40.0000 meq | EXTENDED_RELEASE_TABLET | Freq: Once | ORAL | Status: AC
  Administered 2023-01-19: 40 meq via ORAL
  Filled 2023-01-19: qty 2

## 2023-01-19 MED ORDER — HYDRALAZINE HCL 20 MG/ML IJ SOLN: 10.0000 mg | Freq: Four times a day (QID) | INTRAMUSCULAR | Status: DC | PRN

## 2023-01-19 NOTE — Plan of Care (Signed)
Received patient in bed awake, alert orient x4. Remains on RA. Pain managed with PRN Diluadid. Pain noted after eating. One episode of nausea this shift. Ongoing diarrhea. Skin care rendered. Safety precautions maintained. Problem: Education: Goal: Knowledge of General Education information will improve Description: Including pain rating scale, medication(s)/side effects and non-pharmacologic comfort measures Outcome: Progressing   Problem: Health Behavior/Discharge Planning: Goal: Ability to manage health-related needs will improve Outcome: Progressing   Problem: Clinical Measurements: Goal: Ability to maintain clinical measurements within normal limits will improve Outcome: Progressing Goal: Will remain free from infection Outcome: Progressing Goal: Diagnostic test results will improve Outcome: Progressing Goal: Respiratory complications will improve Outcome: Progressing Goal: Cardiovascular complication will be avoided Outcome: Progressing   Problem: Activity: Goal: Risk for activity intolerance will decrease Outcome: Progressing   Problem: Nutrition: Goal: Adequate nutrition will be maintained Outcome: Progressing   Problem: Coping: Goal: Level of anxiety will decrease Outcome: Progressing   Problem: Elimination: Goal: Will not experience complications related to bowel motility Outcome: Progressing Goal: Will not experience complications related to urinary retention Outcome: Progressing   Problem: Pain Managment: Goal: General experience of comfort will improve Outcome: Progressing   Problem: Safety: Goal: Ability to remain free from injury will improve Outcome: Progressing   Problem: Skin Integrity: Goal: Risk for impaired skin integrity will decrease Outcome: Progressing

## 2023-01-19 NOTE — Progress Notes (Signed)
PROGRESS NOTE    Sherry Key  ZOX:[id number] DOB: 12-08-1985 DOA: 01/16/2023 PCP: Pcp, No   Brief Narrative: 37 year old with past medical history significant for hypertension, chronic cannabis use, cyclic vomiting syndrome presents complaining of abdominal pain, nausea, vomiting and generalized malaise.  Previous vomiting episode last week.  Evaluation in the ED:  CT abdomen and pelvis notable for mild diffuse wall thickening of the hepatic flexure and transverse colon, slightly prominent than expected appendix.  Bicarb at 14, white blood cell 23.    Assessment & Plan:   Principal Problem:   Abdominal pain with vomiting Active Problems:   Leukocytosis   1--Infectious Colitis, C diff.  Abdominal pain, nausea vomiting, diarrhea -CT colonic wall thickening and prominent appendix -Lipase 26 -C Diff antigen and PCR positive. and GI pathogen negative.  -Surgery consulted, no concern for appendicitis.  -GI consulted. Recommend treatment for C diff colitis 10 days of Vancomycin.  -Discontinue Ceftriaxone and Flagyl.  Report one episode of vomiting today, small amount. Still having watery stool.   2-Leukocytosis:  Concern for colitis, CT scan colonic wall thickening.  Continue with IV antibiotics.  UA negative Resolved.   3-Metabolic acidosis: In setting of Diarrhea, vomiting.  Received Bicarb gtt.  Improved. Continue with NS.  Hold Diamox.   Hypokalemia; Replete orally times 2.   Idiopathic Intracranial Hypertension:  She was diagnosed on  10/2022. Had MRI and LP 10/2022. She was started on Topamax and Diamox.  Will continue with Topamax 25 mg BID.  Discussed with neurology, ok to hold Diamox in setting of metabolic acidosis. Resume at discharge, she will need close follow up with [facility] to follow for acidosis.   Elevated Blood Pressure PRN Hydralazin ordered.   Estimated body mass index is 26.23 kg/m as calculated from the following:   Height as of this encounter: 5'  3.39" (1.61 m).   Weight as of this encounter: 68 kg.   DVT prophylaxis: Lovenox Code Status: Full code Family Communication: acre discussed with patient.  Disposition Plan:  Status is: Observation The patient remains OBS appropriate and will d/c before 2 midnights.    Consultants:  GI Surgery   Procedures:    Antimicrobials:    Subjective: She started to have some cramping abdominal pain again. Still having diarrhea. Vomited once this AM/ Objective: Vitals:   01/18/23 1243 01/18/23 2159 01/19/23 0516 01/19/23 1251  BP: (!) 152/107 (!) 143/102 122/80 (!) 178/114  Pulse: (!) 58 (!) 53 81 67  Resp:  20 16 16   Temp: 98.3 F (36.8 C) 98.8 F (37.1 C)  98.8 F (37.1 C)  TempSrc:  Oral    SpO2: 100% 100% 100% 100%  Weight:      Height:        Intake/Output Summary (Last 24 hours) at 01/19/2023 1359 Last data filed at 01/19/2023 1012 Gross per 24 hour  Intake 1549.67 ml  Output --  Net 1549.67 ml   Filed Weights   01/16/23 2246  Weight: 68 kg    Examination:  General exam: NAD Respiratory system: CTA Cardiovascular system: S 1, S 2 RRR Gastrointestinal system: BS present, soft, nt Central nervous system: Alert, oriented.  Extremities: Symmetric 5 x 5 power.    Data Reviewed: I have personally reviewed following labs and imaging studies  CBC: Recent Labs  Lab 01/16/23 1448 01/17/23 0432 01/18/23 0414  WBC 23.2* 12.1* 7.6  NEUTROABS 20.9* 9.4* 5.4  HGB 13.6 11.8* 12.1  HCT 42.6 36.8 35.9*  MCV 97.7 97.9  95.0  PLT 282 254 242   Basic Metabolic Panel: Recent Labs  Lab 01/16/23 1448 01/17/23 0432 01/17/23 1225 01/18/23 0414 01/18/23 1215 01/19/23 0321 01/19/23 1242  NA 140 133* 137 134* 137 134* 137  K 3.4* 3.3* 3.6 3.5 3.8 2.9* 3.6  CL 117* 112* 113* 112* 111 106 108  CO2 14* 14* 14* 14* 18* 21* 21*  GLUCOSE 122* 86 97 93 89 87 102*  BUN 15 10 8  <5* <5* <5* <5*  CREATININE 0.76 0.67 0.78 0.80 0.81 0.88 0.78  CALCIUM 9.1 8.2* 8.4* 8.2*  8.8* 8.3* 8.7*  MG 2.1 2.0  --   --   --   --   --    GFR: Estimated Creatinine Clearance: 90 mL/min (by C-G formula based on SCr of 0.78 mg/dL). Liver Function Tests: Recent Labs  Lab 01/16/23 1448  AST 14*  ALT 14  ALKPHOS 74  BILITOT 0.5  PROT 8.4*  ALBUMIN 4.5   Recent Labs  Lab 01/16/23 1448  LIPASE 26   No results for input(s): "AMMONIA" in the last 168 hours. Coagulation Profile: No results for input(s): "INR", "PROTIME" in the last 168 hours. Cardiac Enzymes: No results for input(s): "CKTOTAL", "CKMB", "CKMBINDEX", "TROPONINI" in the last 168 hours. BNP (last 3 results) No results for input(s): "PROBNP" in the last 8760 hours. HbA1C: No results for input(s): "HGBA1C" in the last 72 hours. CBG: No results for input(s): "GLUCAP" in the last 168 hours. Lipid Profile: No results for input(s): "CHOL", "HDL", "LDLCALC", "TRIG", "CHOLHDL", "LDLDIRECT" in the last 72 hours. Thyroid Function Tests: No results for input(s): "TSH", "T4TOTAL", "FREET4", "T3FREE", "THYROIDAB" in the last 72 hours. Anemia Panel: No results for input(s): "VITAMINB12", "FOLATE", "FERRITIN", "TIBC", "IRON", "RETICCTPCT" in the last 72 hours. Sepsis Labs: No results for input(s): "PROCALCITON", "LATICACIDVEN" in the last 168 hours.  Recent Results (from the past 240 hour(s))  Resp panel by RT-PCR (RSV, Flu A&B, Covid) Anterior Nasal Swab     Status: None   Collection Time: 01/16/23  3:07 PM   Specimen: Anterior Nasal Swab  Result Value Ref Range Status   SARS Coronavirus 2 by RT PCR NEGATIVE NEGATIVE Final    Comment: (NOTE) SARS-CoV-2 target nucleic acids are NOT DETECTED.  The SARS-CoV-2 RNA is generally detectable in upper respiratory specimens during the acute phase of infection. The lowest concentration of SARS-CoV-2 viral copies this assay can detect is 138 copies/mL. A negative result does not preclude SARS-Cov-2 infection and should not be used as the sole basis for treatment  or other patient management decisions. A negative result may occur with  improper specimen collection/handling, submission of specimen other than nasopharyngeal swab, presence of viral mutation(s) within the areas targeted by this assay, and inadequate number of viral copies(<138 copies/mL). A negative result must be combined with clinical observations, patient history, and epidemiological information. The expected result is Negative.  Fact Sheet for Patients:  BloggerCourse.com  Fact Sheet for Healthcare Providers:  SeriousBroker.it  This test is no t yet approved or cleared by the [place] FDA and  has been authorized for detection and/or diagnosis of SARS-CoV-2 by FDA under an Emergency Use Authorization (EUA). This EUA will remain  in effect (meaning this test can be used) for the duration of the COVID-19 declaration under Section 564(b)(1) of the Act, 21 U.S.C.section 360bbb-3(b)(1), unless the authorization is terminated  or revoked sooner.       Influenza A by PCR NEGATIVE NEGATIVE Final   Influenza B by  PCR NEGATIVE NEGATIVE Final    Comment: (NOTE) The Xpert Xpress SARS-CoV-2/FLU/RSV plus assay is intended as an aid in the diagnosis of influenza from Nasopharyngeal swab specimens and should not be used as a sole basis for treatment. Nasal washings and aspirates are unacceptable for Xpert Xpress SARS-CoV-2/FLU/RSV testing.  Fact Sheet for Patients: BloggerCourse.com  Fact Sheet for Healthcare Providers: SeriousBroker.it  This test is not yet approved or cleared by the [place] FDA and has been authorized for detection and/or diagnosis of SARS-CoV-2 by FDA under an Emergency Use Authorization (EUA). This EUA will remain in effect (meaning this test can be used) for the duration of the COVID-19 declaration under Section 564(b)(1) of the Act, 21 U.S.C. section  360bbb-3(b)(1), unless the authorization is terminated or revoked.     Resp Syncytial Virus by PCR NEGATIVE NEGATIVE Final    Comment: (NOTE) Fact Sheet for Patients: BloggerCourse.com  Fact Sheet for Healthcare Providers: SeriousBroker.it  This test is not yet approved or cleared by the [place] FDA and has been authorized for detection and/or diagnosis of SARS-CoV-2 by FDA under an Emergency Use Authorization (EUA). This EUA will remain in effect (meaning this test can be used) for the duration of the COVID-19 declaration under Section 564(b)(1) of the Act, 21 U.S.C. section 360bbb-3(b)(1), unless the authorization is terminated or revoked.  Performed at [facility], [address] [place], [place] [address]   C Difficile Quick Screen w PCR reflex     Status: Abnormal   Collection Time: 01/17/23 12:45 PM   Specimen: Stool  Result Value Ref Range Status   C Diff antigen POSITIVE (A) NEGATIVE Final   C Diff toxin NEGATIVE NEGATIVE Final   C Diff interpretation Results are indeterminate. See PCR results.  Final    Comment: Performed at [facility], [address] [place], [place] [address]  Gastrointestinal Panel by PCR , Stool     Status: None   Collection Time: 01/17/23 12:45 PM   Specimen: Stool  Result Value Ref Range Status   Campylobacter species NOT DETECTED NOT DETECTED Final   Plesimonas shigelloides NOT DETECTED NOT DETECTED Final   Salmonella species NOT DETECTED NOT DETECTED Final   Yersinia enterocolitica NOT DETECTED NOT DETECTED Final   Vibrio species NOT DETECTED NOT DETECTED Final   Vibrio cholerae NOT DETECTED NOT DETECTED Final   Enteroaggregative E coli (EAEC) NOT DETECTED NOT DETECTED Final   Enteropathogenic E coli (EPEC) NOT DETECTED NOT DETECTED Final   Enterotoxigenic E coli (ETEC) NOT DETECTED NOT DETECTED Final   Shiga like toxin producing E coli (STEC)  NOT DETECTED NOT DETECTED Final   Shigella/Enteroinvasive E coli (EIEC) NOT DETECTED NOT DETECTED Final   Cryptosporidium NOT DETECTED NOT DETECTED Final   Cyclospora cayetanensis NOT DETECTED NOT DETECTED Final   Entamoeba histolytica NOT DETECTED NOT DETECTED Final   Giardia lamblia NOT DETECTED NOT DETECTED Final   Adenovirus F40/41 NOT DETECTED NOT DETECTED Final   Astrovirus NOT DETECTED NOT DETECTED Final   Norovirus GI/GII NOT DETECTED NOT DETECTED Final   Rotavirus A NOT DETECTED NOT DETECTED Final   Sapovirus (I, II, IV, and V) NOT DETECTED NOT DETECTED Final    Comment: Performed at [facility], [address] [place], [place] [address]  C. Diff by PCR, Reflexed     Status: Abnormal   Collection Time: 01/17/23 12:45 PM  Result Value Ref Range Status   Toxigenic C. Difficile by PCR POSITIVE (A) NEGATIVE  Final    Comment: Positive for toxigenic C. difficile with little to no toxin production. Only treat if clinical presentation suggests symptomatic illness. Performed at [facility], [address], [place], [place] [address]          Radiology Studies: No results found.      Scheduled Meds:  enoxaparin (LOVENOX) injection  40 mg Subcutaneous Q24H   feeding supplement  1 Container Oral TID BM   multivitamin with minerals  1 tablet Oral Daily   pantoprazole (PROTONIX) IV  40 mg Intravenous Q12H   topiramate  25 mg Oral BID   vancomycin  125 mg Oral Q6H   Continuous Infusions:  sodium chloride 100 mL/hr at 01/19/23 0813   promethazine (PHENERGAN) injection (IM or IVPB) 6.25 mg (01/19/23 1048)     LOS: 2 days    Time spent: 35 minutes    Alfreda Hammad A Kashten Gowin, MD [facility]   If 7PM-7AM, please contact night-coverage [email or web address]  01/19/2023, 1:59 PM

## 2023-01-19 NOTE — Progress Notes (Signed)
Subjective: CC: Abdominal pain resolved sometime yesterday. No abdominal pain this am. Tolerating soft diet without n/v. Diarrhea improving.   Afebrile. No tachycardia or hypotension. WBC wnl yesterday.   Objective: Vital signs in last 24 hours: Temp:  [98.3 F (36.8 C)-98.8 F (37.1 C)] 98.8 F (37.1 C) (07/11 2159) Pulse Rate:  [53-81] 81 (07/12 0516) Resp:  [16-20] 16 (07/12 0516) BP: (122-152)/(80-107) 122/80 (07/12 0516) SpO2:  [100 %] 100 % (07/12 0516) Last BM Date : 01/18/23  Intake/Output from previous day: 07/11 0701 - 07/12 0700 In: 4.7 [I.V.:4.7] Out: -  Intake/Output this shift: No intake/output data recorded.  PE: Gen:  Alert, NAD, pleasant Abd: Soft, ND, NT  Lab Results:  Recent Labs    01/17/23 0432 01/18/23 0414  WBC 12.1* 7.6  HGB 11.8* 12.1  HCT 36.8 35.9*  PLT 254 242   BMET Recent Labs    01/18/23 1215 01/19/23 0321  NA 137 134*  K 3.8 2.9*  CL 111 106  CO2 18* 21*  GLUCOSE 89 87  BUN <5* <5*  CREATININE 0.81 0.88  CALCIUM 8.8* 8.3*   PT/INR No results for input(s): "LABPROT", "INR" in the last 72 hours. CMP     Component Value Date/Time   NA 134 (L) 01/19/2023 0321   K 2.9 (L) 01/19/2023 0321   CL 106 01/19/2023 0321   CO2 21 (L) 01/19/2023 0321   GLUCOSE 87 01/19/2023 0321   BUN <5 (L) 01/19/2023 0321   CREATININE 0.88 01/19/2023 0321   CALCIUM 8.3 (L) 01/19/2023 0321   PROT 8.4 (H) 01/16/2023 1448   ALBUMIN 4.5 01/16/2023 1448   AST 14 (L) 01/16/2023 1448   ALT 14 01/16/2023 1448   ALKPHOS 74 01/16/2023 1448   BILITOT 0.5 01/16/2023 1448   GFRNONAA >60 01/19/2023 0321   GFRAA >60 01/20/2020 0643   Lipase     Component Value Date/Time   LIPASE 26 01/16/2023 1448    Studies/Results: No results found.  Anti-infectives: Anti-infectives (From admission, onward)    Start     Dose/Rate Route Frequency Ordered Stop   01/17/23 2015  cefTRIAXone (ROCEPHIN) 2 g in sodium chloride 0.9 % 100 mL IVPB  Status:   Discontinued        2 g 200 mL/hr over 30 Minutes Intravenous Every 24 hours 01/16/23 2058 01/18/23 1037   01/17/23 1900  vancomycin (VANCOCIN) capsule 125 mg        125 mg Oral Every 6 hours 01/17/23 1813     01/17/23 1000  metroNIDAZOLE (FLAGYL) IVPB 500 mg  Status:  Discontinued        500 mg 100 mL/hr over 60 Minutes Intravenous Every 12 hours 01/16/23 2058 01/18/23 1037   01/16/23 2015  cefTRIAXone (ROCEPHIN) 2 g in sodium chloride 0.9 % 100 mL IVPB       Placed in "And" Linked Group   2 g 200 mL/hr over 30 Minutes Intravenous  Once 01/16/23 2001 01/16/23 2143   01/16/23 2015  metroNIDAZOLE (FLAGYL) IVPB 500 mg       Placed in "And" Linked Group   500 mg 100 mL/hr over 60 Minutes Intravenous  Once 01/16/23 2001 01/16/23 2340        Assessment/Plan This is a 37 y.o. 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  Since presentation she reports she now also has right sided abdominal pain. Her imaging showed mild diffuse wall thickening of the hepatic flexure and transverse colon along with mild diffuse mucosal enhancement in the rectosigmoid and a slightly more prominent appendix at 8 mm without significant stranding. There was no loculated fluid collections, free air or evidence of obstruction.   Her C. Diff is positive (Antigen and Toxigenic PCR). GI is already following. GI following and has started her on PO Vanc.   I do not suspect appendicitis at this time and do not recommend any emergency surgery. She is afebrile without tachycardia or hypotension, wbc has normalized, pain resolved, completely NT on exam and she is tolerating a diet.  Our team will be available as needed moving forward.    I  reviewed nursing notes, Consultant (GI) notes, hospitalist notes, last 24 h vitals and pain scores, last 48 h intake and output, last 24 h labs and trends, and last 24 h imaging results.    LOS: 2 days    Jacinto Halim , [facility] 01/19/2023, 8:15 AM Please see Amion for pager number during day hours 7:00am-4:30pm

## 2023-01-20 ENCOUNTER — Other Ambulatory Visit (HOSPITAL_COMMUNITY): Payer: Self-pay

## 2023-01-20 DIAGNOSIS — R1110 Vomiting, unspecified: Secondary | ICD-10-CM | POA: Diagnosis not present

## 2023-01-20 DIAGNOSIS — R109 Unspecified abdominal pain: Secondary | ICD-10-CM | POA: Diagnosis not present

## 2023-01-20 LAB — BASIC METABOLIC PANEL
Anion gap: 8 (ref 5–15)
BUN: <5 mg/dL — ABNORMAL LOW (ref 6–20)
CO2: 18 mmol/L — ABNORMAL LOW (ref 22–32)
Calcium: 8.8 mg/dL — ABNORMAL LOW (ref 8.9–10.3)
Chloride: 109 mmol/L (ref 98–111)
Creatinine, Ser: 0.67 mg/dL (ref 0.44–1.00)
GFR, Estimated: >60 mL/min (ref >60)
Glucose, Bld: 91 mg/dL (ref 70–99)
Potassium: 4 mmol/L (ref 3.5–5.1)
Sodium: 135 mmol/L (ref 135–145)

## 2023-01-20 MED ORDER — ACETAZOLAMIDE 250 MG PO TABS
500.0000 mg | ORAL_TABLET | Freq: Every day | ORAL | 0 refills | Status: DC
  Filled 2023-01-20: qty 20, 10d supply, fill #0

## 2023-01-20 MED ORDER — PROMETHAZINE HCL 12.5 MG PO TABS
12.5000 mg | ORAL_TABLET | Freq: Three times a day (TID) | ORAL | 0 refills | Status: DC | PRN
  Filled 2023-01-20: qty 15, 5d supply, fill #0

## 2023-01-20 MED ORDER — CERTAVITE/ANTIOXIDANTS PO TABS
1.0000 | ORAL_TABLET | Freq: Every day | ORAL | 0 refills | Status: AC
  Filled 2023-01-20 (×2): qty 30, 30d supply, fill #0

## 2023-01-20 MED ORDER — MELATONIN 5 MG PO TABS
5.0000 mg | ORAL_TABLET | Freq: Every evening | ORAL | Status: DC | PRN
  Administered 2023-01-20: 5 mg via ORAL
  Filled 2023-01-20: qty 1

## 2023-01-20 MED ORDER — VANCOMYCIN HCL 125 MG PO CAPS
125.0000 mg | ORAL_CAPSULE | Freq: Four times a day (QID) | ORAL | 0 refills | Status: AC
  Filled 2023-01-20: qty 32, 8d supply, fill #0

## 2023-01-20 NOTE — Discharge Summary (Signed)
Physician Discharge Summary   Patient: Sherry Key MRN: [id number] DOB: [birth year]  Admit date:     01/16/2023  Discharge date: 01/20/23  Discharge Physician: Jon Billings A Richele Strand   PCP: Pcp, No   Recommendations at discharge:    Needs B-met to follow up metabolic acidosis.  She will resume Diamox 7/14. Needs close follow up with neurologist.  Follow up with PCP for resolution of C diff colitis.  Follow up on BP , might need medications if elevated ou patient.   Discharge Diagnoses: Principal Problem:   Abdominal pain with vomiting Active Problems:   Leukocytosis  Resolved Problems:   * No resolved hospital problems. *  Hospital Course: 37 year old with past medical history significant for hypertension, chronic cannabis use, cyclic vomiting syndrome presents complaining of abdominal pain, nausea, vomiting and generalized malaise.  Previous vomiting episode last week.   Evaluation in the ED:  CT abdomen and pelvis notable for mild diffuse wall thickening of the hepatic flexure and transverse colon, slightly prominent than expected appendix.  Bicarb at 14, white blood cell 23.   Assessment and Plan: 1--Infectious Colitis, C diff.  Abdominal pain, nausea vomiting, diarrhea -CT colonic wall thickening and prominent appendix -Lipase 26 -C Diff antigen and PCR positive. and GI pathogen negative.  -Surgery consulted, no concern for appendicitis.  -GI consulted. Recommend treatment for C diff colitis 10 days of Vancomycin.  -Discontinue Ceftriaxone and Flagyl.  -she is feeling better, stool with more consistency. No significant abdominal pain. Tolerating diet.  Discharge on vancomycin for 8 more days.   2-Leukocytosis:  Concern for colitis, CT scan colonic wall thickening.  Continue with IV antibiotics.  UA negative Resolved.    3-Metabolic acidosis: In setting of Diarrhea, vomiting.  Received Bicarb gtt.  Improved. Continue with NS.  Diamox was held on admission due to  metabolic acidosis.  Improved.    Hypokalemia; Replete orally times 2.    Idiopathic Intracranial Hypertension:  She was diagnosed on  10/2022. Had MRI and LP 10/2022. She was started on Topamax and Diamox.  Will continue with Topamax 25 mg BID.  Discussed with neurology, ok to hold Diamox in setting of metabolic acidosis. Resume at discharge, she will need close follow up with Bmet to follow for acidosis.    Elevated Blood Pressure PRN Hydralazin ordered.   BP fluctuates. Needs follow up/   Estimated body mass index is 26.23 kg/m as calculated from the following:   Height as of this encounter: 5' 3.39" (1.61 m).   Weight as of this encounter: 68 kg.         Consultants: GI, surgery  Procedures performed: None Disposition: Home Diet recommendation:  Discharge Diet Orders (From admission, onward)     Start     Ordered   01/20/23 0000  Diet - low sodium heart healthy        01/20/23 0849           Cardiac diet DISCHARGE MEDICATION: Allergies as of 01/20/2023       Reactions   Dicyclomine Itching   Morphine Itching   Take benadryl with morphine to help with itching   Oxycodone-acetaminophen Hives, Shortness Of Breath, Itching   Abdominal Pain, too   Peanut-containing Drug Products Hives, Itching   Zofran [ondansetron Hcl] Hives, Shortness Of Breath   Ketorolac Other (See Comments)   Caused spasms of tongue and drooling & Dystonia   Other Itching   Fuzzy fruit   Percocet [oxycodone-acetaminophen] Hives, Itching   Tomato  Hives, Itching   Zofran Hives, Itching   Haloperidol Rash        Medication List     STOP taking these medications    Doxylamine-Pyridoxine 10-10 MG Tbec   HYDROcodone-acetaminophen 5-325 MG tablet Commonly known as: NORCO/VICODIN   hydrOXYzine 25 MG tablet Commonly known as: ATARAX   ibuprofen 200 MG tablet Commonly known as: ADVIL   metoCLOPramide 10 MG tablet Commonly known as: REGLAN   omeprazole 20 MG capsule Commonly  known as: PRILOSEC       TAKE these medications    acetaminophen 500 MG tablet Commonly known as: TYLENOL Take 500 mg by mouth every 6 (six) hours as needed for moderate pain or mild pain.   acetaZOLAMIDE 250 MG tablet Commonly known as: DIAMOX Take 2 tablets (500 mg total) by mouth at bedtime. Start taking on: January 21, 2023   CertaVite/Antioxidants Tabs Take 1 tablet by mouth daily.   diphenhydrAMINE 25 MG tablet Commonly known as: BENADRYL Take 25 mg by mouth every 6 (six) hours as needed for allergies.   promethazine 12.5 MG tablet Commonly known as: PHENERGAN Take 1 tablet (12.5 mg total) by mouth every 8 (eight) hours as needed for up to 5 days for nausea or vomiting. What changed: Another medication with the same name was removed. Continue taking this medication, and follow the directions you see here.   topiramate 25 MG tablet Commonly known as: Topamax Take 1 tablet (25 mg total) by mouth at bedtime. May increase to 50mg  after 7 days   vancomycin 125 MG capsule Commonly known as: VANCOCIN Take 1 capsule (125 mg total) by mouth every 6 (six) hours for 8 days.        Follow-up Information     Morene Crocker, MD Follow up.   Specialty: Neurology Why: keep appointment on Monday Contact information: [address] [place] [address] [phone]                Discharge Exam: Filed Weights   01/16/23 2246 01/20/23 0500  Weight: 68 kg 69.1 kg   General; NAD  Condition at discharge: stable  The results of significant diagnostics from this hospitalization (including imaging, microbiology, ancillary and laboratory) are listed below for reference.   Imaging Studies: CT ABDOMEN PELVIS W CONTRAST  Result Date: 01/16/2023 CLINICAL DATA:  Abdominal pain, vomiting EXAM: CT ABDOMEN AND PELVIS WITH CONTRAST TECHNIQUE: Multidetector CT imaging of the abdomen and pelvis was performed using the standard protocol following bolus administration of  intravenous contrast. RADIATION DOSE REDUCTION: This exam was performed according to the departmental dose-optimization program which includes automated exposure control, adjustment of the mA and/or kV according to patient size and/or use of iterative reconstruction technique. CONTRAST:  OMNIPAQUE IOHEXOL 300 MG/ML  SOLN COMPARISON:  10/07/2022 FINDINGS: Lower chest: Visualized lower lung fields are clear. Hepatobiliary: Surgical clips are seen in gallbladder fossa. There is no significant dilation of bile ducts. No focal abnormalities are seen in liver. Pancreas: There is 9 mm low-density structure in the tail of pancreas with no interval change. The lesion has been seen in multiple previous studies dating as far back as 02/20/2011. Spleen: Unremarkable. Adrenals/Urinary Tract: Adrenals are unremarkable. There is no hydronephrosis. There are no renal or ureteral stones. Urinary bladder is unremarkable. Stomach/Bowel: Small hiatal hernia is seen. Stomach is not distended. Small bowel loops are not dilated. The appendix measures up to 8 mm in diameter. There are pockets of air and fluid in the lumen  of the appendix. There is no pericecal inflammation. There is mild thickening in hepatic flexure and transverse colon. Scattered diverticula are seen in colon without signs of focal acute diverticulitis. There is mild mucosal enhancement in rectosigmoid. Vascular/Lymphatic: Unremarkable. Reproductive: Uterus is not seen. Other: There is no ascites or pneumoperitoneum. Musculoskeletal: Decrease in height of upper endplate of body of L1 vertebra has not changed. IMPRESSION: There is no evidence of intestinal obstruction or pneumoperitoneum. There is no hydronephrosis. There is mild diffuse wall thickening in the hepatic flexure and transverse colon which may suggest acute on chronic nonspecific inflammation. There is mild diffuse mucosal enhancement in rectosigmoid suggesting possible nonspecific inflammation. There  are no loculated pericolic fluid collections. Scattered diverticula seen in the colon without signs of focal acute diverticulitis. Appendix is slightly more prominent than usual measuring up to 8 mm in diameter without significant pericolic stranding. This may be a normal variation or suggest early appendicitis. If there are continued symptoms in right lower quadrant, short-term follow-up CT in 1-2 days may be considered. Status post cholecystectomy. There is 9 mm low-density lesion in tail of pancreas with no significant interval change. Electronically Signed   By: Ernie Avena M.D.   On: 01/16/2023 17:07    Microbiology: Results for orders placed or performed during the hospital encounter of 01/16/23  Resp panel by RT-PCR (RSV, Flu A&B, Covid) Anterior Nasal Swab     Status: None   Collection Time: 01/16/23  3:07 PM   Specimen: Anterior Nasal Swab  Result Value Ref Range Status   SARS Coronavirus 2 by RT PCR NEGATIVE NEGATIVE Final    Comment: (NOTE) SARS-CoV-2 target nucleic acids are NOT DETECTED.  The SARS-CoV-2 RNA is generally detectable in upper respiratory specimens during the acute phase of infection. The lowest concentration of SARS-CoV-2 viral copies this assay can detect is 138 copies/mL. A negative result does not preclude SARS-Cov-2 infection and should not be used as the sole basis for treatment or other patient management decisions. A negative result may occur with  improper specimen collection/handling, submission of specimen other than nasopharyngeal swab, presence of viral mutation(s) within the areas targeted by this assay, and inadequate number of viral copies(<138 copies/mL). A negative result must be combined with clinical observations, patient history, and epidemiological information. The expected result is Negative.  Fact Sheet for Patients:  BloggerCourse.com  Fact Sheet for Healthcare Providers:   SeriousBroker.it  This test is no t yet approved or cleared by the [place] FDA and  has been authorized for detection and/or diagnosis of SARS-CoV-2 by FDA under an Emergency Use Authorization (EUA). This EUA will remain  in effect (meaning this test can be used) for the duration of the COVID-19 declaration under Section 564(b)(1) of the Act, 21 U.S.C.section 360bbb-3(b)(1), unless the authorization is terminated  or revoked sooner.       Influenza A by PCR NEGATIVE NEGATIVE Final   Influenza B by PCR NEGATIVE NEGATIVE Final    Comment: (NOTE) The Xpert Xpress SARS-CoV-2/FLU/RSV plus assay is intended as an aid in the diagnosis of influenza from Nasopharyngeal swab specimens and should not be used as a sole basis for treatment. Nasal washings and aspirates are unacceptable for Xpert Xpress SARS-CoV-2/FLU/RSV testing.  Fact Sheet for Patients: BloggerCourse.com  Fact Sheet for Healthcare Providers: SeriousBroker.it  This test is not yet approved or cleared by the [place] FDA and has been authorized for detection and/or diagnosis of SARS-CoV-2 by FDA under an Emergency Use Authorization (EUA). This  EUA will remain in effect (meaning this test can be used) for the duration of the COVID-19 declaration under Section 564(b)(1) of the Act, 21 U.S.C. section 360bbb-3(b)(1), unless the authorization is terminated or revoked.     Resp Syncytial Virus by PCR NEGATIVE NEGATIVE Final    Comment: (NOTE) Fact Sheet for Patients: BloggerCourse.com  Fact Sheet for Healthcare Providers: SeriousBroker.it  This test is not yet approved or cleared by the [place] FDA and has been authorized for detection and/or diagnosis of SARS-CoV-2 by FDA under an Emergency Use Authorization (EUA). This EUA will remain in effect (meaning this test can be used) for  the duration of the COVID-19 declaration under Section 564(b)(1) of the Act, 21 U.S.C. section 360bbb-3(b)(1), unless the authorization is terminated or revoked.  Performed at [facility], [address], [place], [place] [address]   C Difficile Quick Screen w PCR reflex     Status: Abnormal   Collection Time: 01/17/23 12:45 PM   Specimen: Stool  Result Value Ref Range Status   C Diff antigen POSITIVE (A) NEGATIVE Final   C Diff toxin NEGATIVE NEGATIVE Final   C Diff interpretation Results are indeterminate. See PCR results.  Final    Comment: Performed at [facility], [address] [place], [place] [address]  Gastrointestinal Panel by PCR , Stool     Status: None   Collection Time: 01/17/23 12:45 PM   Specimen: Stool  Result Value Ref Range Status   Campylobacter species NOT DETECTED NOT DETECTED Final   Plesimonas shigelloides NOT DETECTED NOT DETECTED Final   Salmonella species NOT DETECTED NOT DETECTED Final   Yersinia enterocolitica NOT DETECTED NOT DETECTED Final   Vibrio species NOT DETECTED NOT DETECTED Final   Vibrio cholerae NOT DETECTED NOT DETECTED Final   Enteroaggregative E coli (EAEC) NOT DETECTED NOT DETECTED Final   Enteropathogenic E coli (EPEC) NOT DETECTED NOT DETECTED Final   Enterotoxigenic E coli (ETEC) NOT DETECTED NOT DETECTED Final   Shiga like toxin producing E coli (STEC) NOT DETECTED NOT DETECTED Final   Shigella/Enteroinvasive E coli (EIEC) NOT DETECTED NOT DETECTED Final   Cryptosporidium NOT DETECTED NOT DETECTED Final   Cyclospora cayetanensis NOT DETECTED NOT DETECTED Final   Entamoeba histolytica NOT DETECTED NOT DETECTED Final   Giardia lamblia NOT DETECTED NOT DETECTED Final   Adenovirus F40/41 NOT DETECTED NOT DETECTED Final   Astrovirus NOT DETECTED NOT DETECTED Final   Norovirus GI/GII NOT DETECTED NOT DETECTED Final   Rotavirus A NOT DETECTED NOT DETECTED Final   Sapovirus (I, II, IV, and V) NOT  DETECTED NOT DETECTED Final    Comment: Performed at [facility], [address] [place], [place] [address]  C. Diff by PCR, Reflexed     Status: Abnormal   Collection Time: 01/17/23 12:45 PM  Result Value Ref Range Status   Toxigenic C. Difficile by PCR POSITIVE (A) NEGATIVE Final    Comment: Positive for toxigenic C. difficile with little to no toxin production. Only treat if clinical presentation suggests symptomatic illness. Performed at [facility], [address] [place], [place] [address]     Labs: CBC: Recent Labs  Lab 01/16/23 1448 01/17/23 0432 01/18/23 0414  WBC 23.2* 12.1* 7.6  NEUTROABS 20.9* 9.4* 5.4  HGB 13.6 11.8* 12.1  HCT 42.6 36.8 35.9*  MCV 97.7 97.9 95.0  PLT 282 254 242   Basic Metabolic Panel: Recent Labs  Lab 01/16/23 1448 01/17/23 0432 01/17/23  1225 01/18/23 0414 01/18/23 1215 01/19/23 0321 01/19/23 1242 01/20/23 0415  NA 140 133*   < > 134* 137 134* 137 135  K 3.4* 3.3*   < > 3.5 3.8 2.9* 3.6 4.0  CL 117* 112*   < > 112* 111 106 108 109  CO2 14* 14*   < > 14* 18* 21* 21* 18*  GLUCOSE 122* 86   < > 93 89 87 102* 91  BUN 15 10   < > <5* <5* <5* <5* <5*  CREATININE 0.76 0.67   < > 0.80 0.81 0.88 0.78 0.67  CALCIUM 9.1 8.2*   < > 8.2* 8.8* 8.3* 8.7* 8.8*  MG 2.1 2.0  --   --   --   --   --   --    < > = values in this interval not displayed.   Liver Function Tests: Recent Labs  Lab 01/16/23 1448  AST 14*  ALT 14  ALKPHOS 74  BILITOT 0.5  PROT 8.4*  ALBUMIN 4.5   CBG: No results for input(s): "GLUCAP" in the last 168 hours.  Discharge time spent: greater than 30 minutes.  Signed: Alba Cory, MD [facility] 01/20/2023

## 2023-01-20 NOTE — Plan of Care (Signed)
Pt is A&Ox4. C/o pain twice and was given dilaudid for pain. Asked for sleep medication which was requested for and administered. Continues to be on IV continuous fluids. No c/o nausea or diarrhea. Will continue to monitor  Problem: Education: Goal: Knowledge of General Education information will improve Description: Including pain rating scale, medication(s)/side effects and non-pharmacologic comfort measures Outcome: Progressing   Problem: Health Behavior/Discharge Planning: Goal: Ability to manage health-related needs will improve Outcome: Progressing   Problem: Clinical Measurements: Goal: Ability to maintain clinical measurements within normal limits will improve Outcome: Progressing Goal: Will remain free from infection Outcome: Progressing Goal: Diagnostic test results will improve Outcome: Progressing Goal: Respiratory complications will improve Outcome: Progressing Goal: Cardiovascular complication will be avoided Outcome: Progressing   Problem: Activity: Goal: Risk for activity intolerance will decrease Outcome: Progressing   Problem: Nutrition: Goal: Adequate nutrition will be maintained Outcome: Progressing   Problem: Coping: Goal: Level of anxiety will decrease Outcome: Progressing   Problem: Elimination: Goal: Will not experience complications related to bowel motility Outcome: Progressing Goal: Will not experience complications related to urinary retention Outcome: Progressing   Problem: Pain Managment: Goal: General experience of comfort will improve Outcome: Progressing   Problem: Safety: Goal: Ability to remain free from injury will improve Outcome: Progressing   Problem: Skin Integrity: Goal: Risk for impaired skin integrity will decrease Outcome: Progressing

## 2023-01-20 NOTE — Progress Notes (Signed)
Call to [facility] outpatient pharmacy. This RN spoke w/ sarah  who stated discharge meds were ready except for multivitamin which would be a self pay ($1.15). Pt updated that pharm tech would deliver meds in approx 10 mins to pt at that time pt will go home via uber. Pt verbalized an understanding of the d/c instructions - no other questions at this time.

## 2023-01-20 NOTE — Plan of Care (Signed)
Patient left unit after medication delivery from pharmacy. Patient accompanied by unit NT to awaiting cab. No signs or symptoms of acute distress at the time of discharge. Problem: Education: Goal: Knowledge of General Education information will improve Description: Including pain rating scale, medication(s)/side effects and non-pharmacologic comfort measures Outcome: Adequate for Discharge   Problem: Health Behavior/Discharge Planning: Goal: Ability to manage health-related needs will improve Outcome: Adequate for Discharge   Problem: Clinical Measurements: Goal: Ability to maintain clinical measurements within normal limits will improve Outcome: Adequate for Discharge Goal: Will remain free from infection Outcome: Adequate for Discharge Goal: Diagnostic test results will improve Outcome: Adequate for Discharge Goal: Respiratory complications will improve Outcome: Adequate for Discharge Goal: Cardiovascular complication will be avoided Outcome: Adequate for Discharge   Problem: Activity: Goal: Risk for activity intolerance will decrease Outcome: Adequate for Discharge   Problem: Nutrition: Goal: Adequate nutrition will be maintained Outcome: Adequate for Discharge   Problem: Coping: Goal: Level of anxiety will decrease Outcome: Adequate for Discharge   Problem: Elimination: Goal: Will not experience complications related to bowel motility Outcome: Adequate for Discharge Goal: Will not experience complications related to urinary retention Outcome: Adequate for Discharge   Problem: Pain Managment: Goal: General experience of comfort will improve Outcome: Adequate for Discharge   Problem: Safety: Goal: Ability to remain free from injury will improve Outcome: Adequate for Discharge   Problem: Skin Integrity: Goal: Risk for impaired skin integrity will decrease Outcome: Adequate for Discharge

## 2023-01-23 ENCOUNTER — Emergency Department (HOSPITAL_COMMUNITY)
Admission: EM | Admit: 2023-01-23 | Discharge: 2023-01-23 | Disposition: A | Discharge status: Home / Self Care | Payer: MEDICAID | Attending: Emergency Medicine | Admitting: Emergency Medicine

## 2023-01-23 ENCOUNTER — Emergency Department (HOSPITAL_COMMUNITY): Payer: MEDICAID

## 2023-01-23 ENCOUNTER — Other Ambulatory Visit: Payer: Self-pay

## 2023-01-23 ENCOUNTER — Encounter (HOSPITAL_COMMUNITY): Payer: Self-pay

## 2023-01-23 DIAGNOSIS — Z91010 Allergy to peanuts: Secondary | ICD-10-CM | POA: Diagnosis not present

## 2023-01-23 DIAGNOSIS — R1084 Generalized abdominal pain: Secondary | ICD-10-CM | POA: Insufficient documentation

## 2023-01-23 DIAGNOSIS — R112 Nausea with vomiting, unspecified: Secondary | ICD-10-CM | POA: Insufficient documentation

## 2023-01-23 LAB — COMPREHENSIVE METABOLIC PANEL
ALT: 23 U/L (ref 0–44)
AST: 24 U/L (ref 15–41)
Albumin: 4.6 g/dL (ref 3.5–5.0)
Alkaline Phosphatase: 80 U/L (ref 38–126)
Anion gap: 8 (ref 5–15)
BUN: 9 mg/dL (ref 6–20)
CO2: 16 mmol/L — ABNORMAL LOW (ref 22–32)
Calcium: 9.2 mg/dL (ref 8.9–10.3)
Chloride: 111 mmol/L (ref 98–111)
Creatinine, Ser: 0.78 mg/dL (ref 0.44–1.00)
GFR, Estimated: >60 mL/min (ref >60)
Glucose, Bld: 109 mg/dL — ABNORMAL HIGH (ref 70–99)
Potassium: 3.7 mmol/L (ref 3.5–5.1)
Sodium: 135 mmol/L (ref 135–145)
Total Bilirubin: 0.7 mg/dL (ref 0.3–1.2)
Total Protein: 8.4 g/dL — ABNORMAL HIGH (ref 6.5–8.1)

## 2023-01-23 LAB — CBC
HCT: 41.4 % (ref 36.0–46.0)
Hemoglobin: 13.5 g/dL (ref 12.0–15.0)
MCH: 31.8 pg (ref 26.0–34.0)
MCHC: 32.6 g/dL (ref 30.0–36.0)
MCV: 97.6 fL (ref 80.0–100.0)
Platelets: 288 10*3/uL (ref 150–400)
RBC: 4.24 MIL/uL (ref 3.87–5.11)
RDW: 13.7 % (ref 11.5–15.5)
WBC: 14.6 10*3/uL — ABNORMAL HIGH (ref 4.0–10.5)
nRBC: 0 % (ref 0.0–0.2)

## 2023-01-23 LAB — URINALYSIS, ROUTINE W REFLEX MICROSCOPIC
Bilirubin Urine: NEGATIVE
Glucose, UA: NEGATIVE mg/dL
Hgb urine dipstick: NEGATIVE
Ketones, ur: NEGATIVE mg/dL
Leukocytes,Ua: NEGATIVE
Nitrite: NEGATIVE
Protein, ur: NEGATIVE mg/dL
Specific Gravity, Urine: 1.014 (ref 1.005–1.030)
pH: 8 (ref 5.0–8.0)

## 2023-01-23 LAB — LIPASE, BLOOD: Lipase: 30 U/L (ref 11–51)

## 2023-01-23 MED ORDER — DIPHENHYDRAMINE HCL 50 MG/ML IJ SOLN
25.0000 mg | Freq: Once | INTRAMUSCULAR | Status: AC
  Administered 2023-01-23: 25 mg via INTRAVENOUS
  Filled 2023-01-23: qty 1

## 2023-01-23 MED ORDER — MORPHINE SULFATE (PF) 4 MG/ML IV SOLN
6.0000 mg | Freq: Once | INTRAVENOUS | Status: AC
  Administered 2023-01-23: 6 mg via INTRAVENOUS
  Filled 2023-01-23: qty 2

## 2023-01-23 MED ORDER — SODIUM CHLORIDE 0.9 % IV SOLN
25.0000 mg | Freq: Four times a day (QID) | INTRAVENOUS | Status: DC | PRN
  Administered 2023-01-23: 25 mg via INTRAVENOUS
  Filled 2023-01-23: qty 25

## 2023-01-23 MED ORDER — HYDROMORPHONE HCL 1 MG/ML IJ SOLN
1.0000 mg | Freq: Once | INTRAMUSCULAR | Status: AC
  Administered 2023-01-23: 1 mg via INTRAVENOUS
  Filled 2023-01-23: qty 1

## 2023-01-23 MED ORDER — IOHEXOL 300 MG/ML  SOLN
100.0000 mL | Freq: Once | INTRAMUSCULAR | Status: AC | PRN
  Administered 2023-01-23: 100 mL via INTRAVENOUS

## 2023-01-23 NOTE — ED Notes (Signed)
Patient off the floor.

## 2023-01-23 NOTE — ED Triage Notes (Signed)
Patient BIB EMS from work. Patient reports sudden onset of LLQ pain, nausea, and vomiting this AM. Patient recently admitted for similar episode.

## 2023-01-23 NOTE — ED Provider Notes (Addendum)
[place] EMERGENCY DEPARTMENT AT [facility] Provider Note   CSN: [id number] Arrival date & time: 01/23/23  1055     History  Chief Complaint  Patient presents with   Abdominal Pain    Sherry Key is a 37 y.o. female.  HPI    37 year old comes in with chief complaint of abdominal pain. Patient has history of cyclic emergency room.  She was recently admitted to the hospital for colitis and is currently on antibiotics for C. difficile colitis.  At the time of admission, she had appendix inflammation as well, but clinically she was not appendicitis and not receive appendectomy.  Patient states that this morning she was driving sudden onset severe abdominal pain.  The pain is right-sided.  She has associated nausea with vomiting.  She also continues to have loose bowel movements.  Home Medications Prior to Admission medications   Medication Sig Start Date End Date Taking? Authorizing Provider  acetaminophen (TYLENOL) 500 MG tablet Take 500 mg by mouth every 6 (six) hours as needed for moderate pain or mild pain.    [provider]  acetaZOLAMIDE (DIAMOX) 250 MG tablet Take 2 tablets (500 mg total) by mouth at bedtime. 01/21/23   Regalado, Belkys A, MD  diphenhydrAMINE (BENADRYL) 25 MG tablet Take 25 mg by mouth every 6 (six) hours as needed for allergies.    [provider]  Multiple Vitamins-Minerals (CERTAVITE/ANTIOXIDANTS) TABS Take 1 tablet by mouth daily. 01/20/23   Regalado, Belkys A, MD  promethazine (PHENERGAN) 12.5 MG tablet Take 1 tablet (12.5 mg total) by mouth every 8 (eight) hours as needed for up to 5 days for nausea or vomiting. 01/20/23 01/25/23  Regalado, Jon Billings A, MD  topiramate (TOPAMAX) 25 MG tablet Take 1 tablet (25 mg total) by mouth at bedtime. May increase to 50mg  after 7 days 11/03/22 11/03/23  Willy Eddy, MD  vancomycin (VANCOCIN) 125 MG capsule Take 1 capsule (125 mg total) by mouth every 6 (six) hours for 8 days. 01/20/23  01/28/23  Regalado, Jon Billings A, MD      Allergies    Dicyclomine, Morphine, Oxycodone-acetaminophen, Peanut-containing drug products, Zofran [ondansetron hcl], Ketorolac, Other, Percocet [oxycodone-acetaminophen], Tomato, Zofran, and Haloperidol    Review of Systems   Review of Systems  All other systems reviewed and are negative.   Physical Exam Updated Vital Signs BP (!) 130/96   Pulse 85   Temp 98.7 F (37.1 C)   Resp 18   Ht 5\' 3"  (1.6 m)   Wt 69.1 kg   LMP 10/19/2019   SpO2 100%   BMI 27.00 kg/m  Physical Exam Vitals and nursing note reviewed.  Constitutional:      Appearance: She is well-developed.  HENT:     Head: Atraumatic.  Cardiovascular:     Rate and Rhythm: Normal rate.  Pulmonary:     Effort: Pulmonary effort is normal.  Abdominal:     Tenderness: There is abdominal tenderness in the right upper quadrant and right lower quadrant. There is guarding. Positive signs include Rovsing's sign. Negative signs include Murphy's sign and McBurney's sign.  Musculoskeletal:     Cervical back: Normal range of motion and neck supple.  Skin:    General: Skin is warm and dry.  Neurological:     Mental Status: She is alert and oriented to person, place, and time.     ED Results / Procedures / Treatments   Labs (all labs ordered are listed, but only abnormal results are displayed)  Labs Reviewed  COMPREHENSIVE METABOLIC PANEL - Abnormal; Notable for the following components:      Result Value   CO2 16 (*)    Glucose, Bld 109 (*)    Total Protein 8.4 (*)    All other components within normal limits  CBC - Abnormal; Notable for the following components:   WBC 14.6 (*)    All other components within normal limits  URINALYSIS, ROUTINE W REFLEX MICROSCOPIC - Abnormal; Notable for the following components:   APPearance HAZY (*)    All other components within normal limits  LIPASE, BLOOD    EKG None  Radiology CT ABDOMEN PELVIS W CONTRAST  Result Date:  01/23/2023 CLINICAL DATA:  Or right lower quadrant abdominal pain. History of colitis. Nausea and vomiting. EXAM: CT ABDOMEN AND PELVIS WITH CONTRAST TECHNIQUE: Multidetector CT imaging of the abdomen and pelvis was performed using the standard protocol following bolus administration of intravenous contrast. RADIATION DOSE REDUCTION: This exam was performed according to the departmental dose-optimization program which includes automated exposure control, adjustment of the mA and/or kV according to patient size and/or use of iterative reconstruction technique. CONTRAST:  OMNIPAQUE IOHEXOL 300 MG/ML  SOLN COMPARISON:  12/17/2022 FINDINGS: Lower chest: Lung bases are clear. Hepatobiliary: Liver parenchyma is normal. Previous cholecystectomy. Pancreas: Redemonstration of an 8 mm low-density in the tail of the pancreas. This is stable as distant as 2012 and therefore benign. Spleen: Normal Adrenals/Urinary Tract: Adrenal glands are normal. Kidneys are normal. No mass, stone or hydronephrosis. Bladder is normal. Stomach/Bowel: Stomach and small intestine are normal. Normal appearance of the appendix. The colon appears normal as evaluated without oral contrast. Vascular/Lymphatic: Aorta and IVC are normal.  No adenopathy. Reproductive: Previous hysterectomy.  No pelvic mass. Other: No free fluid or air. Musculoskeletal: Old superior endplate compression fracture of L1. IMPRESSION: 1. No acute finding by CT. No abnormality seen to explain right lower quadrant pain. Normal appearing appendix. 2. Previous cholecystectomy and hysterectomy. 3. 8 mm low-density in the tail of the pancreas, stable as distant as 2012 and therefore benign. 4. Old superior endplate compression fracture of L1. Electronically Signed   By: Paulina Fusi M.D.   On: 01/23/2023 15:54    Procedures Procedures    Medications Ordered in ED Medications  promethazine (PHENERGAN) 25 mg in sodium chloride 0.9 % 50 mL IVPB (0 mg Intravenous Stopped  01/23/23 1351)  morphine (PF) 4 MG/ML injection 6 mg (has no administration in time range)  diphenhydrAMINE (BENADRYL) injection 25 mg (has no administration in time range)  HYDROmorphone (DILAUDID) injection 1 mg (1 mg Intravenous Given 01/23/23 1207)  iohexol (OMNIPAQUE) 300 MG/ML solution 100 mL (100 mLs Intravenous Contrast Given 01/23/23 1501)    ED Course/ Medical Decision Making/ A&P Clinical Course as of 01/23/23 1628  Tue Jan 23, 2023  1442 Patient reassessed.  Still complains of persistent abdominal pain despite hydromorphone.  Still nauseous.  Given her recent admission to the hospital for colitis, equivocal CT finding for appendicitis and persistent right-sided abdominal pain, upper quadrant worse than lower with patient stating that this pain is different than her cyclic vomiting syndrome.  We will proceed with CT scan.  She has leukocytosis as well.  Oral challenge also initiated. [AN]    Clinical Course User Index [AN] Derwood Kaplan, MD                             Medical Decision Making Amount  and/or Complexity of Data Reviewed Labs: ordered. Radiology: ordered.  Risk Prescription drug management.  37 year old patient comes in with chief complaint of abdominal pain.  Patient recently admitted to the hospital for colitis and is currently on antibiotics for C. difficile colitis.  She is also someone who had CT scan that showed enlarged appendix.  On exam patient has lower quadrant tenderness, that is diffuse.  The pain is worse over the right upper quadrant.  She also has right lower quadrant tenderness, but negative McBurney's and positive Rovsing's.  differential diagnosis for her is essentially cyclic vomiting syndrome, gastroparesis, chronic abdominal pain, colitis, diverticulitis, appendicitis, ileus, small bowel obstruction  My suspicion is that patient likely has gastroparesis/cyclic vomiting type syndrome right now.  We will give her pain medications and initiate  p.o. challenge.  Will reassess, if she still not doing well then we might have to CT her.  4:58 PM Reassessed after the CT scan.  CT is negative for acute finding.  She will receive pain medication here, will advise follow-up.  She has already passed oral challenge.  She has PCP was put in referral for [facility].  The patient appears reasonably screened and/or stabilized for discharge and I doubt any other medical condition or other [facility] requiring further screening, evaluation, or treatment in the ED at this time prior to discharge.   Results from the ER workup discussed with the patient face to face and all questions answered to the best of my ability. The patient is safe for discharge with strict return precautions.   Final Clinical Impression(s) / ED Diagnoses Final diagnoses:  Generalized abdominal pain    Rx / DC Orders ED Discharge Orders     None         Derwood Kaplan, MD 01/23/23 1658    Derwood Kaplan, MD 01/23/23 1659

## 2023-01-23 NOTE — Discharge Instructions (Signed)
We saw you in the ER for abdominal discomfort. The results of our workup, including labs and imaging are reassuring at this time.   Symptoms can evolve, therefore please return to the ER if you have increased pain, fevers, chills, inability to keep any medications down, confusion, sweating. Otherwise see your primary care doctor in 2-3 days for further evaluation.

## 2023-04-29 ENCOUNTER — Other Ambulatory Visit: Payer: Self-pay

## 2023-04-29 ENCOUNTER — Encounter (HOSPITAL_COMMUNITY): Payer: Self-pay

## 2023-04-29 ENCOUNTER — Emergency Department (HOSPITAL_COMMUNITY)
Admission: EM | Admit: 2023-04-29 | Discharge: 2023-04-30 | Discharge status: Against Medical Advice | Payer: MEDICAID | Attending: Emergency Medicine | Admitting: Emergency Medicine

## 2023-04-29 DIAGNOSIS — Z5321 Procedure and treatment not carried out due to patient leaving prior to being seen by health care provider: Secondary | ICD-10-CM | POA: Insufficient documentation

## 2023-04-29 DIAGNOSIS — R1110 Vomiting, unspecified: Secondary | ICD-10-CM | POA: Diagnosis not present

## 2023-04-29 DIAGNOSIS — R1013 Epigastric pain: Secondary | ICD-10-CM | POA: Diagnosis present

## 2023-04-29 NOTE — ED Triage Notes (Signed)
Patient is here for evaluation of abdominal pain with nausea, vomiting and diarrhea since 1300 today.

## 2023-04-29 NOTE — ED Notes (Signed)
Attempted to obtain labs, pt actively vomiting unable at this time.

## 2023-04-29 NOTE — ED Provider Triage Note (Signed)
Emergency Medicine Provider Triage Evaluation Note  JANAVIA SPIZZIRRI , a 37 y.o. female  was evaluated in triage.  Pt complains of epigastic pain and vomiting Onset 100 pm. Hx of same.  Review of Systems  Positive: vomiting Negative: fever  Physical Exam  BP (!) 154/104 (BP Location: Left Arm)   Pulse (!) 108   Temp 98.4 F (36.9 C) (Oral)   Resp 20   Ht 5\' 3"  (1.6 m)   Wt 68 kg   LMP 10/19/2019   SpO2 100%   BMI 26.57 kg/m  Gen:   Awake, no distress   Resp:  Normal effort  MSK:   Moves extremities without difficulty  Other:    Medical Decision Making  Medically screening exam initiated at 6:10 PM.  Appropriate orders placed.  JASENIA PEWITT was informed that the remainder of the evaluation will be completed by another provider, this initial triage assessment does not replace that evaluation, and the importance of remaining in the ED until their evaluation is complete.     Arthor Captain, PA-C 04/29/23 [phone]

## 2023-06-18 ENCOUNTER — Emergency Department (HOSPITAL_COMMUNITY)
Admission: EM | Admit: 2023-06-18 | Discharge: 2023-06-18 | Disposition: A | Discharge status: Home / Self Care | Payer: MEDICAID | Attending: Emergency Medicine | Admitting: Emergency Medicine

## 2023-06-18 ENCOUNTER — Emergency Department (HOSPITAL_COMMUNITY): Payer: MEDICAID

## 2023-06-18 ENCOUNTER — Other Ambulatory Visit: Payer: Self-pay

## 2023-06-18 DIAGNOSIS — I10 Essential (primary) hypertension: Secondary | ICD-10-CM | POA: Insufficient documentation

## 2023-06-18 DIAGNOSIS — D72829 Elevated white blood cell count, unspecified: Secondary | ICD-10-CM | POA: Diagnosis not present

## 2023-06-18 DIAGNOSIS — M545 Low back pain, unspecified: Secondary | ICD-10-CM | POA: Diagnosis not present

## 2023-06-18 DIAGNOSIS — M79652 Pain in left thigh: Secondary | ICD-10-CM | POA: Diagnosis not present

## 2023-06-18 DIAGNOSIS — M542 Cervicalgia: Secondary | ICD-10-CM | POA: Diagnosis not present

## 2023-06-18 DIAGNOSIS — Z5329 Procedure and treatment not carried out because of patient's decision for other reasons: Secondary | ICD-10-CM | POA: Insufficient documentation

## 2023-06-18 DIAGNOSIS — M546 Pain in thoracic spine: Secondary | ICD-10-CM | POA: Diagnosis not present

## 2023-06-18 DIAGNOSIS — R1084 Generalized abdominal pain: Secondary | ICD-10-CM | POA: Diagnosis not present

## 2023-06-18 DIAGNOSIS — R112 Nausea with vomiting, unspecified: Secondary | ICD-10-CM | POA: Diagnosis present

## 2023-06-18 DIAGNOSIS — R109 Unspecified abdominal pain: Secondary | ICD-10-CM

## 2023-06-18 LAB — CBC WITH DIFFERENTIAL/PLATELET
Abs Immature Granulocytes: 0.18 10*3/uL — ABNORMAL HIGH (ref 0.00–0.07)
Basophils Absolute: 0.1 10*3/uL (ref 0.0–0.1)
Basophils Relative: 0 %
Eosinophils Absolute: 0 10*3/uL (ref 0.0–0.5)
Eosinophils Relative: 0 %
HCT: 42.4 % (ref 36.0–46.0)
Hemoglobin: 14.2 g/dL (ref 12.0–15.0)
Immature Granulocytes: 1 %
Lymphocytes Relative: 2 %
Lymphs Abs: 0.5 10*3/uL — ABNORMAL LOW (ref 0.7–4.0)
MCH: 32.3 pg (ref 26.0–34.0)
MCHC: 33.5 g/dL (ref 30.0–36.0)
MCV: 96.6 fL (ref 80.0–100.0)
Monocytes Absolute: 0.5 10*3/uL (ref 0.1–1.0)
Monocytes Relative: 2 %
Neutro Abs: 27.7 10*3/uL — ABNORMAL HIGH (ref 1.7–7.7)
Neutrophils Relative %: 95 %
Platelets: 290 10*3/uL (ref 150–400)
RBC: 4.39 MIL/uL (ref 3.87–5.11)
RDW: 12.4 % (ref 11.5–15.5)
WBC: 29 10*3/uL — ABNORMAL HIGH (ref 4.0–10.5)
nRBC: 0 % (ref 0.0–0.2)

## 2023-06-18 LAB — LIPASE, BLOOD: Lipase: 26 U/L (ref 11–51)

## 2023-06-18 LAB — RAPID URINE DRUG SCREEN, HOSP PERFORMED
Amphetamines: NOT DETECTED
Barbiturates: NOT DETECTED
Benzodiazepines: NOT DETECTED
Cocaine: NOT DETECTED
Opiates: POSITIVE — AB
Tetrahydrocannabinol: POSITIVE — AB

## 2023-06-18 LAB — URINALYSIS, ROUTINE W REFLEX MICROSCOPIC
Bacteria, UA: NONE SEEN
Bilirubin Urine: NEGATIVE
Glucose, UA: NEGATIVE mg/dL
Ketones, ur: 20 mg/dL — AB
Leukocytes,Ua: NEGATIVE
Nitrite: NEGATIVE
Protein, ur: 100 mg/dL — AB
Specific Gravity, Urine: 1.01 (ref 1.005–1.030)
pH: 6 (ref 5.0–8.0)

## 2023-06-18 LAB — COMPREHENSIVE METABOLIC PANEL
ALT: 18 U/L (ref 0–44)
AST: 22 U/L (ref 15–41)
Albumin: 5 g/dL (ref 3.5–5.0)
Alkaline Phosphatase: 75 U/L (ref 38–126)
Anion gap: 14 (ref 5–15)
BUN: 15 mg/dL (ref 6–20)
CO2: 17 mmol/L — ABNORMAL LOW (ref 22–32)
Calcium: 10 mg/dL (ref 8.9–10.3)
Chloride: 105 mmol/L (ref 98–111)
Creatinine, Ser: 0.86 mg/dL (ref 0.44–1.00)
GFR, Estimated: >60 mL/min (ref >60)
Glucose, Bld: 131 mg/dL — ABNORMAL HIGH (ref 70–99)
Potassium: 3.7 mmol/L (ref 3.5–5.1)
Sodium: 136 mmol/L (ref 135–145)
Total Bilirubin: 1 mg/dL (ref <1.2)
Total Protein: 8.9 g/dL — ABNORMAL HIGH (ref 6.5–8.1)

## 2023-06-18 LAB — ETHANOL: Alcohol, Ethyl (B): <10 mg/dL (ref <10)

## 2023-06-18 MED ORDER — HYDROMORPHONE HCL 1 MG/ML IJ SOLN
1.0000 mg | Freq: Once | INTRAMUSCULAR | Status: AC
  Administered 2023-06-18: 1 mg via INTRAVENOUS
  Filled 2023-06-18: qty 1

## 2023-06-18 MED ORDER — DIPHENHYDRAMINE HCL 50 MG/ML IJ SOLN
25.0000 mg | Freq: Once | INTRAMUSCULAR | Status: AC
  Administered 2023-06-18: 25 mg via INTRAVENOUS
  Filled 2023-06-18: qty 1

## 2023-06-18 MED ORDER — DROPERIDOL 2.5 MG/ML IJ SOLN: 1.2500 mg | Freq: Once | INTRAMUSCULAR | Status: DC

## 2023-06-18 MED ORDER — IOHEXOL 300 MG/ML  SOLN
100.0000 mL | Freq: Once | INTRAMUSCULAR | Status: AC | PRN
  Administered 2023-06-18: 100 mL via INTRAVENOUS

## 2023-06-18 MED ORDER — SODIUM CHLORIDE 0.9 % IV SOLN
25.0000 mg | Freq: Four times a day (QID) | INTRAVENOUS | Status: DC | PRN
  Administered 2023-06-18: 25 mg via INTRAVENOUS
  Filled 2023-06-18: qty 1

## 2023-06-18 NOTE — ED Provider Notes (Signed)
[place] EMERGENCY DEPARTMENT AT [facility] Provider Note   CSN: [id number] Arrival date & time: 06/18/23  1512     History  Chief Complaint  Patient presents with   Fall    N/v crohn's flair up    Sherry Key is a 37 y.o. female.   Fall  Patient has a history of cyclic vomiting syndrome hypertension, Crohn's disease, fibroids, endometriosis, cannabinoid hyperemesis syndrome.  Patient's had a partial hysterectomy, cholecystectomy hernia repair, tubal ligation.  Patient states she started having multiple episodes of nausea vomiting diarrhea consistent with her cyclic vomiting syndrome.  Patient states she was going to the shower when she had a syncopal episode and fell.  Patient states she struck her head and neck.  He is having pain in the back of her head the back of her neck in her mid and lower back.  She also has pain in her left thigh.  Patient states she is hurting throughout her entire abdomen and has not been able to keep anything down.  She is constantly vomiting.     Home Medications Prior to Admission medications   Medication Sig Start Date End Date Taking? Authorizing Provider  acetaminophen (TYLENOL) 500 MG tablet Take 500 mg by mouth every 6 (six) hours as needed for moderate pain or mild pain.    [provider]  acetaZOLAMIDE (DIAMOX) 250 MG tablet Take 2 tablets (500 mg total) by mouth at bedtime. 01/21/23   Regalado, Belkys A, MD  diphenhydrAMINE (BENADRYL) 25 MG tablet Take 25 mg by mouth every 6 (six) hours as needed for allergies.    [provider]  Multiple Vitamins-Minerals (CERTAVITE/ANTIOXIDANTS) TABS Take 1 tablet by mouth daily. 01/20/23   Regalado, Belkys A, MD  promethazine (PHENERGAN) 12.5 MG tablet Take 1 tablet (12.5 mg total) by mouth every 8 (eight) hours as needed for up to 5 days for nausea or vomiting. 01/20/23 01/25/23  Regalado, Kimia Finan Billings A, MD  topiramate (TOPAMAX) 25 MG tablet Take 1 tablet (25 mg total) by  mouth at bedtime. May increase to 50mg  after 7 days 11/03/22 11/03/23  Willy Eddy, MD      Allergies    Dicyclomine, Morphine, Oxycodone-acetaminophen, Peanut-containing drug products, Zofran [ondansetron hcl], Ketorolac, Other, Percocet [oxycodone-acetaminophen], Tomato, Zofran, and Haloperidol    Review of Systems   Review of Systems  Physical Exam Updated Vital Signs BP (!) 160/127   Pulse 97   Resp (!) 28   Ht 1.6 m (5\' 3" )   Wt 68 kg   LMP 10/19/2019   SpO2 100%   BMI 26.56 kg/m  Physical Exam Vitals and nursing note reviewed.  Constitutional:      Appearance: She is well-developed. She is ill-appearing.  HENT:     Head: Normocephalic and atraumatic.     Right Ear: External ear normal.     Left Ear: External ear normal.  Eyes:     General: No scleral icterus.       Right eye: No discharge.        Left eye: No discharge.     Conjunctiva/sclera: Conjunctivae normal.  Neck:     Trachea: No tracheal deviation.  Cardiovascular:     Rate and Rhythm: Normal rate and regular rhythm.  Pulmonary:     Effort: Pulmonary effort is normal. No respiratory distress.     Breath sounds: Normal breath sounds. No stridor. No wheezing or rales.  Abdominal:     General: Bowel sounds are normal. There is  no distension.     Palpations: Abdomen is soft.     Tenderness: There is abdominal tenderness. There is no guarding or rebound.     Comments: Generalized tenderness, vomiting at the bedside  Musculoskeletal:        General: No deformity.     Cervical back: Neck supple. Tenderness present.     Thoracic back: Tenderness present.     Lumbar back: Tenderness present.     Comments: Tenderness palpation left mid thigh, no ecchymoses no swelling, no deformity noted  Skin:    General: Skin is warm and dry.     Findings: No rash.  Neurological:     General: No focal deficit present.     Mental Status: She is alert.     Cranial Nerves: No cranial nerve deficit, dysarthria or facial  asymmetry.     Sensory: No sensory deficit.     Motor: No abnormal muscle tone or seizure activity.     Coordination: Coordination normal.  Psychiatric:        Mood and Affect: Mood normal.     ED Results / Procedures / Treatments   Labs (all labs ordered are listed, but only abnormal results are displayed) Labs Reviewed  COMPREHENSIVE METABOLIC PANEL - Abnormal; Notable for the following components:      Result Value   CO2 17 (*)    Glucose, Bld 131 (*)    Total Protein 8.9 (*)    All other components within normal limits  CBC WITH DIFFERENTIAL/PLATELET - Abnormal; Notable for the following components:   WBC 29.0 (*)    Neutro Abs 27.7 (*)    Lymphs Abs 0.5 (*)    Abs Immature Granulocytes 0.18 (*)    All other components within normal limits  LIPASE, BLOOD  ETHANOL  URINALYSIS, ROUTINE W REFLEX MICROSCOPIC  RAPID URINE DRUG SCREEN, HOSP PERFORMED    EKG None  Radiology CT ABDOMEN PELVIS W CONTRAST  Result Date: 06/18/2023 CLINICAL DATA:  Abdominal pain, Crohn's disease EXAM: CT ABDOMEN AND PELVIS WITH CONTRAST TECHNIQUE: Multidetector CT imaging of the abdomen and pelvis was performed using the standard protocol following bolus administration of intravenous contrast. RADIATION DOSE REDUCTION: This exam was performed according to the departmental dose-optimization program which includes automated exposure control, adjustment of the mA and/or kV according to patient size and/or use of iterative reconstruction technique. CONTRAST:  OMNIPAQUE IOHEXOL 300 MG/ML  SOLN COMPARISON:  01/23/2023 FINDINGS: Lower chest: Lung bases are clear. Hepatobiliary: Liver is within normal limits. Status post cholecystectomy. No intrahepatic or extrahepatic ductal dilatation. Pancreas: 8 mm cyst in the pancreatic tail (series 2/image 24), unchanged, likely reflecting a benign pseudocyst. This is stable dating back to at least 2010. No follow-up is recommended. Spleen: Within normal limits.  Adrenals/Urinary Tract: Adrenal glands are within normal limits. Kidneys are within normal limits.  No hydronephrosis. Bladder is within normal limits. Stomach/Bowel: Stomach is within normal limits. No evidence of bowel obstruction. No bowel wall thickening or inflammatory changes. Specifically, the terminal ileum is within normal limits. Normal appendix (series 2/image 61). No colonic wall thickening or inflammatory changes. Vascular/Lymphatic: No evidence of abdominal aortic aneurysm. Reproductive: No suspicious abdominopelvic lymphadenopathy. Other: Status post hysterectomy. Bilateral ovaries are within normal limits. Musculoskeletal: Mild superior endplate changes at L1, chronic. IMPRESSION: No CT findings to account for the patient's abdominal pain. Specifically, no findings suspicious for active inflammatory Crohn's disease. Additional stable postsurgical and ancillary findings as above. Electronically Signed   By: Lurlean Horns  Rito Ehrlich M.D.   On: 06/18/2023 18:45   DG Femur Min 2 Views Left  Result Date: 06/18/2023 CLINICAL DATA:  Fall, pain EXAM: LEFT FEMUR 2 VIEWS COMPARISON:  None Available. FINDINGS: No fracture or dislocation is seen. The joint spaces are preserved. Visualized soft tissues are within normal limits. IMPRESSION: Negative. Electronically Signed   By: Charline Bills M.D.   On: 06/18/2023 18:34   DG Thoracic Spine 2 View  Result Date: 06/18/2023 CLINICAL DATA:  Fall, pain EXAM: THORACIC SPINE 2 VIEWS COMPARISON:  None Available. FINDINGS: Normal thoracic kyphosis. No evidence of fracture or dislocation. Vertebral body heights and intervertebral disc spaces are maintained. Visualized lungs are clear. Cholecystectomy clips. IMPRESSION: Negative. Electronically Signed   By: Charline Bills M.D.   On: 06/18/2023 18:33   DG Lumbar Spine Complete  Result Date: 06/18/2023 CLINICAL DATA:  Fall, pain EXAM: LUMBAR SPINE - COMPLETE 4+ VIEW COMPARISON:  None Available. FINDINGS: Five  lumbar-type vertebral bodies. Normal lumbar lordosis. No evidence of fracture or dislocation. Vertebral body heights are maintained. Visualized bony pelvis appears intact. Cholecystectomy clips. IMPRESSION: Negative. Electronically Signed   By: Charline Bills M.D.   On: 06/18/2023 18:33   CT Head Wo Contrast  Result Date: 06/18/2023 CLINICAL DATA:  Head trauma, moderate-severe; Neck trauma, midline tenderness (Age 86-64y) EXAM: CT HEAD WITHOUT CONTRAST CT CERVICAL SPINE WITHOUT CONTRAST TECHNIQUE: Multidetector CT imaging of the head and cervical spine was performed following the standard protocol without intravenous contrast. Multiplanar CT image reconstructions of the cervical spine were also generated. RADIATION DOSE REDUCTION: This exam was performed according to the departmental dose-optimization program which includes automated exposure control, adjustment of the mA and/or kV according to patient size and/or use of iterative reconstruction technique. COMPARISON:  None Available. FINDINGS: CT HEAD FINDINGS Brain: No evidence of acute infarction, hemorrhage, hydrocephalus, extra-axial collection or mass lesion/mass effect. Vascular: No hyperdense vessel. Skull: No acute fracture. Sinuses/Orbits: Mostly clear sinuses.  No acute findings. Other: No mastoid effusions. CT CERVICAL SPINE FINDINGS Alignment: No substantial sagittal subluxation. Skull base and vertebrae: No evidence of acute fracture. Soft tissues and spinal canal: No prevertebral fluid or swelling. No visible canal hematoma. Disc levels:  Degenerative disc disease, greatest at C5-C6. Upper chest: Visualized lung apices are clear. IMPRESSION: No evidence of acute abnormality intracranially or in the cervical spine. Electronically Signed   By: Feliberto Harts M.D.   On: 06/18/2023 17:41   CT Cervical Spine Wo Contrast  Result Date: 06/18/2023 CLINICAL DATA:  Head trauma, moderate-severe; Neck trauma, midline tenderness (Age 86-64y) EXAM: CT  HEAD WITHOUT CONTRAST CT CERVICAL SPINE WITHOUT CONTRAST TECHNIQUE: Multidetector CT imaging of the head and cervical spine was performed following the standard protocol without intravenous contrast. Multiplanar CT image reconstructions of the cervical spine were also generated. RADIATION DOSE REDUCTION: This exam was performed according to the departmental dose-optimization program which includes automated exposure control, adjustment of the mA and/or kV according to patient size and/or use of iterative reconstruction technique. COMPARISON:  None Available. FINDINGS: CT HEAD FINDINGS Brain: No evidence of acute infarction, hemorrhage, hydrocephalus, extra-axial collection or mass lesion/mass effect. Vascular: No hyperdense vessel. Skull: No acute fracture. Sinuses/Orbits: Mostly clear sinuses.  No acute findings. Other: No mastoid effusions. CT CERVICAL SPINE FINDINGS Alignment: No substantial sagittal subluxation. Skull base and vertebrae: No evidence of acute fracture. Soft tissues and spinal canal: No prevertebral fluid or swelling. No visible canal hematoma. Disc levels:  Degenerative disc disease, greatest at C5-C6. Upper chest: Visualized lung  apices are clear. IMPRESSION: No evidence of acute abnormality intracranially or in the cervical spine. Electronically Signed   By: Feliberto Harts M.D.   On: 06/18/2023 17:41    Procedures Procedures    Medications Ordered in ED Medications  promethazine (PHENERGAN) 25 mg in sodium chloride 0.9 % 50 mL IVPB (0 mg Intravenous Stopped 06/18/23 1743)  diphenhydrAMINE (BENADRYL) injection 25 mg (25 mg Intravenous Given 06/18/23 1647)  HYDROmorphone (DILAUDID) injection 1 mg (1 mg Intravenous Given 06/18/23 1646)  HYDROmorphone (DILAUDID) injection 1 mg (1 mg Intravenous Given 06/18/23 1752)  iohexol (OMNIPAQUE) 300 MG/ML solution 100 mL (100 mLs Intravenous Contrast Given 06/18/23 1807)    ED Course/ Medical Decision Making/ A&P Clinical Course as of 06/18/23  2007  Mon Jun 18, 2023  1741 CBC with Diff(!) White blood cell count elevated at 29,000.  With her history of Crohn's disease will CT image to rule out obstruction colitis [JK]  1803 Metabolic panel does show decreased bicarb, similar to previous [JK]  1804 Lipase and ethanol normal [JK]  1804 CT Head Wo Contrast [JK]  1804 Head CT and C-spine CT without acute findings [JK]  1940 CT ABDOMEN PELVIS W CONTRAST Abdominal pelvis CT without acute findings [JK]    Clinical Course User Index [JK] Linwood Dibbles, MD                                 Medical Decision Making Torrential diagnosis includes but not limited to pancreatitis hepatitis cholecystitis diverticulitis bowel obstruction  Problems Addressed: Abdominal pain, unspecified abdominal location: acute illness or injury that poses a threat to life or bodily functions Hypertension, unspecified type: undiagnosed new problem with uncertain prognosis  Amount and/or Complexity of Data Reviewed Labs: ordered. Decision-making details documented in ED Course. Radiology: ordered. Decision-making details documented in ED Course.  Risk Prescription drug management.   Patient presented to the ED for evaluation of severe abdominal pain.  Patient states she has a history of cyclic vomiting syndrome.  Patient's laboratory tests were notable for severe leukocytosis.  Electrolyte panel did show decreased bicarb although not significantly changed compared to prior.  No signs of hepatitis or pancreatitis.  Patient also complained of injuries associated with fall.  Fortunately no signs of any acute fracture or serious injury.  Patient's symptoms improved with combination of antiemetics and IV narcotic pain medications.  Patient was still waiting on urinalysis sample.  Patient states she has to leave.  She is feeling much better.  She does not have a ride home and that she lives right now.  I explained to her still concerned about her elevated white blood  cell count in the urine sample.  Is possible she could have a kidney infection.  Patient states she will follow-up to be rechecked  Patient will be leaving AMA although overall she does appear much better        Final Clinical Impression(s) / ED Diagnoses Final diagnoses:  Abdominal pain, unspecified abdominal location  Hypertension, unspecified type    Rx / DC Orders ED Discharge Orders     None         Linwood Dibbles, MD 06/18/23 2009

## 2023-06-18 NOTE — Discharge Instructions (Signed)
Follow-up with your primary care doctor to recheck on your blood pressure.  Your white blood cell count was very elevated today although the CAT scan did not show any signs of blockage or inflammation.  We were still waiting on urine sample results when you had to leave.  Please follow-up with a primary care doctor or urgent care doctor to be rechecked.

## 2023-06-18 NOTE — ED Triage Notes (Signed)
Pt states began having a crohn's episode around 5am today, n/v/d abd pain, decided to get into shower about 20 min pta and had syncopal episode, c/o head pain from hitting her head, back pain and neck pain.  Pt very lethargic appearing, unable to keep eyes open

## 2023-06-25 ENCOUNTER — Encounter (HOSPITAL_BASED_OUTPATIENT_CLINIC_OR_DEPARTMENT_OTHER): Payer: Self-pay | Admitting: Emergency Medicine

## 2023-06-25 ENCOUNTER — Emergency Department (HOSPITAL_BASED_OUTPATIENT_CLINIC_OR_DEPARTMENT_OTHER)
Admission: EM | Admit: 2023-06-25 | Discharge: 2023-06-25 | Disposition: A | Discharge status: Home / Self Care | Payer: MEDICAID | Attending: Emergency Medicine | Admitting: Emergency Medicine

## 2023-06-25 ENCOUNTER — Other Ambulatory Visit: Payer: Self-pay

## 2023-06-25 DIAGNOSIS — R1084 Generalized abdominal pain: Secondary | ICD-10-CM | POA: Diagnosis present

## 2023-06-25 DIAGNOSIS — R112 Nausea with vomiting, unspecified: Secondary | ICD-10-CM | POA: Diagnosis not present

## 2023-06-25 DIAGNOSIS — Z91010 Allergy to peanuts: Secondary | ICD-10-CM | POA: Insufficient documentation

## 2023-06-25 DIAGNOSIS — R509 Fever, unspecified: Secondary | ICD-10-CM | POA: Diagnosis not present

## 2023-06-25 DIAGNOSIS — R197 Diarrhea, unspecified: Secondary | ICD-10-CM | POA: Insufficient documentation

## 2023-06-25 LAB — COMPREHENSIVE METABOLIC PANEL
ALT: 9 U/L (ref 0–44)
AST: 13 U/L — ABNORMAL LOW (ref 15–41)
Albumin: 4 g/dL (ref 3.5–5.0)
Alkaline Phosphatase: 62 U/L (ref 38–126)
Anion gap: 7 (ref 5–15)
BUN: 11 mg/dL (ref 6–20)
CO2: 23 mmol/L (ref 22–32)
Calcium: 9 mg/dL (ref 8.9–10.3)
Chloride: 105 mmol/L (ref 98–111)
Creatinine, Ser: 0.84 mg/dL (ref 0.44–1.00)
GFR, Estimated: >60 mL/min (ref >60)
Glucose, Bld: 93 mg/dL (ref 70–99)
Potassium: 3.5 mmol/L (ref 3.5–5.1)
Sodium: 135 mmol/L (ref 135–145)
Total Bilirubin: 0.9 mg/dL (ref <1.2)
Total Protein: 7.2 g/dL (ref 6.5–8.1)

## 2023-06-25 LAB — CBC WITH DIFFERENTIAL/PLATELET
Abs Immature Granulocytes: 0.04 10*3/uL (ref 0.00–0.07)
Basophils Absolute: 0.1 10*3/uL (ref 0.0–0.1)
Basophils Relative: 1 %
Eosinophils Absolute: 0.1 10*3/uL (ref 0.0–0.5)
Eosinophils Relative: 2 %
HCT: 38.5 % (ref 36.0–46.0)
Hemoglobin: 12.8 g/dL (ref 12.0–15.0)
Immature Granulocytes: 0 %
Lymphocytes Relative: 17 %
Lymphs Abs: 1.6 10*3/uL (ref 0.7–4.0)
MCH: 32 pg (ref 26.0–34.0)
MCHC: 33.2 g/dL (ref 30.0–36.0)
MCV: 96.3 fL (ref 80.0–100.0)
Monocytes Absolute: 0.7 10*3/uL (ref 0.1–1.0)
Monocytes Relative: 7 %
Neutro Abs: 7 10*3/uL (ref 1.7–7.7)
Neutrophils Relative %: 73 %
Platelets: 284 10*3/uL (ref 150–400)
RBC: 4 MIL/uL (ref 3.87–5.11)
RDW: 12.1 % (ref 11.5–15.5)
WBC: 9.4 10*3/uL (ref 4.0–10.5)
nRBC: 0 % (ref 0.0–0.2)

## 2023-06-25 LAB — LIPASE, BLOOD: Lipase: 32 U/L (ref 11–51)

## 2023-06-25 MED ORDER — PROMETHAZINE HCL 25 MG/ML IJ SOLN
INTRAMUSCULAR | Status: AC
  Administered 2023-06-25: 25 mg
  Filled 2023-06-25: qty 1

## 2023-06-25 MED ORDER — HYDROMORPHONE HCL 1 MG/ML IJ SOLN
1.0000 mg | Freq: Once | INTRAMUSCULAR | Status: AC
  Administered 2023-06-25: 1 mg via INTRAVENOUS
  Filled 2023-06-25: qty 1

## 2023-06-25 MED ORDER — SODIUM CHLORIDE 0.9 % IV SOLN
25.0000 mg | Freq: Once | INTRAVENOUS | Status: AC
  Administered 2023-06-25: 25 mg via INTRAVENOUS
  Filled 2023-06-25: qty 1

## 2023-06-25 MED ORDER — SODIUM CHLORIDE 0.9 % IV BOLUS
1000.0000 mL | Freq: Once | INTRAVENOUS | Status: AC
  Administered 2023-06-25: 1000 mL via INTRAVENOUS

## 2023-06-25 NOTE — ED Triage Notes (Signed)
Pt states has cyclical  vomiting episodes and seen for same last week states she has had diarrhea today and last vomited  last night , here with her son today

## 2023-06-25 NOTE — ED Notes (Signed)
 Discharge paperwork reviewed entirely with patient, including follow up care. Pain was under control. The patient received instruction and coaching on their prescriptions, and all follow-up questions were answered.  Pt verbalized understanding as well as all parties involved. No questions or concerns voiced at the time of discharge. No acute distress noted.   Pt ambulated out to PVA without incident or assistance.  Pt advised they will notify their PCP immediately. and Pt advised they will seek followup care with a specialist and followup with their PCP.

## 2023-06-25 NOTE — ED Provider Notes (Signed)
[place] EMERGENCY DEPARTMENT AT [facility] Provider Note   CSN: [id number] Arrival date & time: 06/25/23  2956     History  Chief Complaint  Patient presents with   Abdominal Pain    Sherry Key is a 37 y.o. female.  Patient in the brought her son in for evaluation of shortness of breath.  She decided to check on herself complaining of continued nausea vomiting and diarrhea.  History of cyclic vomiting and Crohn's.  Follows with GI at [place].  Has been symptomatic for about a week.  Seen at [facility] on the ninth had labs and a CAT scan.  White blood cell count was elevated.  Felt better and left AGAINST MEDICAL ADVICE.  She is not sure if she has had any fever.  No blood in the vomit or diarrhea.  Rates her abdominal pain is 5 out of 10.  Also feels lightheaded.  The history is provided by the patient.  Abdominal Pain Pain location:  Generalized Pain quality: aching   Pain severity:  Moderate Onset quality:  Gradual Duration:  1 week Timing:  Constant Progression:  Unchanged Chronicity:  Recurrent Relieved by:  Nothing Associated symptoms: diarrhea, fever, nausea and vomiting   Associated symptoms: no chest pain, no constipation, no cough, no dysuria, no hematemesis, no hematochezia, no hematuria and no shortness of breath        Home Medications Prior to Admission medications   Medication Sig Start Date End Date Taking? Authorizing Provider  acetaminophen (TYLENOL) 500 MG tablet Take 500 mg by mouth every 6 (six) hours as needed for moderate pain or mild pain.    [provider]  acetaZOLAMIDE (DIAMOX) 250 MG tablet Take 2 tablets (500 mg total) by mouth at bedtime. 01/21/23   Regalado, Belkys A, MD  diphenhydrAMINE (BENADRYL) 25 MG tablet Take 25 mg by mouth every 6 (six) hours as needed for allergies.    [provider]  Multiple Vitamins-Minerals (CERTAVITE/ANTIOXIDANTS) TABS Take 1 tablet by mouth daily. 01/20/23   Regalado,  Belkys A, MD  promethazine (PHENERGAN) 12.5 MG tablet Take 1 tablet (12.5 mg total) by mouth every 8 (eight) hours as needed for up to 5 days for nausea or vomiting. 01/20/23 01/25/23  Regalado, Jon Billings A, MD  topiramate (TOPAMAX) 25 MG tablet Take 1 tablet (25 mg total) by mouth at bedtime. May increase to 50mg  after 7 days 11/03/22 11/03/23  Willy Eddy, MD      Allergies    Dicyclomine, Morphine, Oxycodone-acetaminophen, Peanut-containing drug products, Zofran [ondansetron hcl], Ketorolac, Other, Percocet [oxycodone-acetaminophen], Tomato, Zofran, and Haloperidol    Review of Systems   Review of Systems  Constitutional:  Positive for fever.  Respiratory:  Negative for cough and shortness of breath.   Cardiovascular:  Negative for chest pain.  Gastrointestinal:  Positive for abdominal pain, diarrhea, nausea and vomiting. Negative for constipation, hematemesis and hematochezia.  Genitourinary:  Negative for dysuria and hematuria.    Physical Exam Updated Vital Signs BP (!) 135/111 (BP Location: Right Arm)   Pulse 98   Temp 98.7 F (37.1 C) (Oral)   Resp 16   Ht 5\' 3"  (1.6 m)   Wt 68 kg   LMP 10/19/2019   SpO2 99%   BMI 26.56 kg/m  Physical Exam Vitals and nursing note reviewed.  Constitutional:      General: She is not in acute distress.    Appearance: Normal appearance. She is well-developed.  HENT:     Head:  Normocephalic and atraumatic.  Eyes:     Conjunctiva/sclera: Conjunctivae normal.  Cardiovascular:     Rate and Rhythm: Normal rate and regular rhythm.     Heart sounds: No murmur heard. Pulmonary:     Effort: Pulmonary effort is normal. No respiratory distress.     Breath sounds: Normal breath sounds.  Abdominal:     Palpations: Abdomen is soft.     Tenderness: There is no abdominal tenderness. There is no guarding or rebound.  Musculoskeletal:        General: No swelling.     Cervical back: Neck supple.  Skin:    General: Skin is warm and dry.      Capillary Refill: Capillary refill takes less than 2 seconds.  Neurological:     General: No focal deficit present.     Mental Status: She is alert.     ED Results / Procedures / Treatments   Labs (all labs ordered are listed, but only abnormal results are displayed) Labs Reviewed  COMPREHENSIVE METABOLIC PANEL - Abnormal; Notable for the following components:      Result Value   AST 13 (*)    All other components within normal limits  GASTROINTESTINAL PANEL BY PCR, STOOL (REPLACES STOOL CULTURE)  LIPASE, BLOOD  CBC WITH DIFFERENTIAL/PLATELET    EKG None  Radiology No results found.  Procedures Procedures    Medications Ordered in ED Medications  sodium chloride 0.9 % bolus 1,000 mL (0 mLs Intravenous Stopped 06/25/23 1120)  promethazine (PHENERGAN) 25 mg in sodium chloride 0.9 % 50 mL IVPB (0 mg Intravenous Stopped 06/25/23 1022)  HYDROmorphone (DILAUDID) injection 1 mg (1 mg Intravenous Given 06/25/23 1004)  promethazine (PHENERGAN) 25 MG/ML injection (25 mg  Given 06/25/23 1005)  HYDROmorphone (DILAUDID) injection 1 mg (1 mg Intravenous Given 06/25/23 1140)    ED Course/ Medical Decision Making/ A&P Clinical Course as of 06/25/23 1808  Mon Jun 25, 2023  1237 Patient has had no vomiting or diarrhea here.  She said she is not driving and is can a call for a ride.  Has not given a urine sample yet.  Do not feel she needs further treatment here.  Recommend close follow-up with her GI team.  Had a CAT scan 4 days ago that was negative. [MB]    Clinical Course User Index [MB] Terrilee Files, MD                                 Medical Decision Making Amount and/or Complexity of Data Reviewed Labs: ordered.  Risk Prescription drug management.   This patient complains of continued abdominal pain nausea vomiting diarrhea; this involves an extensive number of treatment Options and is a complaint that carries with it a high risk of complications and morbidity. The  differential includes gastroparesis, cyclic vomiting, obstruction, dehydration, metabolic derangement, gastritis  I ordered, reviewed and interpreted labs, which included CBC normal chemistries LFTs normal I ordered medication IV pain medicine nausea medication fluids and reviewed PMP when indicated.  I independently visualized and interpreted imaging from a few days ago which showed no acute findings Previous records obtained and reviewed in epic including recent ED visit from 5 days ago Social determinants considered, patient with food housing and utility insecurity Critical Interventions: None  After the interventions stated above, I reevaluated the patient and found patient's symptoms to be improved Admission and further testing considered, no indications for admission  or further workup at this time.  Recommended close follow-up with her treatment team.  Return instructions discussed         Final Clinical Impression(s) / ED Diagnoses Final diagnoses:  Generalized abdominal pain  Nausea vomiting and diarrhea    Rx / DC Orders ED Discharge Orders     None         Terrilee Files, MD 06/25/23 1810

## 2023-06-25 NOTE — Discharge Instructions (Signed)
You were seen in the emergency department for worsening abdominal pain nausea vomiting diarrhea.  You had blood work that did not show any obvious cause of your symptoms.  Please follow-up with your GI team.

## 2023-08-10 ENCOUNTER — Encounter (HOSPITAL_BASED_OUTPATIENT_CLINIC_OR_DEPARTMENT_OTHER): Payer: Self-pay | Admitting: Urology

## 2023-08-10 ENCOUNTER — Emergency Department (HOSPITAL_BASED_OUTPATIENT_CLINIC_OR_DEPARTMENT_OTHER): Payer: MEDICAID

## 2023-08-10 ENCOUNTER — Emergency Department (HOSPITAL_BASED_OUTPATIENT_CLINIC_OR_DEPARTMENT_OTHER)
Admission: EM | Admit: 2023-08-10 | Discharge: 2023-08-10 | Disposition: A | Discharge status: Home / Self Care | Payer: MEDICAID | Attending: Emergency Medicine | Admitting: Emergency Medicine

## 2023-08-10 ENCOUNTER — Other Ambulatory Visit: Payer: Self-pay

## 2023-08-10 DIAGNOSIS — I10 Essential (primary) hypertension: Secondary | ICD-10-CM | POA: Insufficient documentation

## 2023-08-10 DIAGNOSIS — Z20822 Contact with and (suspected) exposure to covid-19: Secondary | ICD-10-CM | POA: Diagnosis not present

## 2023-08-10 DIAGNOSIS — H538 Other visual disturbances: Secondary | ICD-10-CM | POA: Insufficient documentation

## 2023-08-10 DIAGNOSIS — R519 Headache, unspecified: Secondary | ICD-10-CM | POA: Insufficient documentation

## 2023-08-10 DIAGNOSIS — Z91010 Allergy to peanuts: Secondary | ICD-10-CM | POA: Diagnosis not present

## 2023-08-10 DIAGNOSIS — M542 Cervicalgia: Secondary | ICD-10-CM | POA: Diagnosis present

## 2023-08-10 DIAGNOSIS — E875 Hyperkalemia: Secondary | ICD-10-CM | POA: Insufficient documentation

## 2023-08-10 LAB — CBC WITH DIFFERENTIAL/PLATELET
Abs Immature Granulocytes: 0.04 10*3/uL (ref 0.00–0.07)
Basophils Absolute: 0 10*3/uL (ref 0.0–0.1)
Basophils Relative: 1 %
Eosinophils Absolute: 0.2 10*3/uL (ref 0.0–0.5)
Eosinophils Relative: 3 %
HCT: 39.4 % (ref 36.0–46.0)
Hemoglobin: 13.1 g/dL (ref 12.0–15.0)
Immature Granulocytes: 1 %
Lymphocytes Relative: 15 %
Lymphs Abs: 1.3 10*3/uL (ref 0.7–4.0)
MCH: 31.9 pg (ref 26.0–34.0)
MCHC: 33.2 g/dL (ref 30.0–36.0)
MCV: 95.9 fL (ref 80.0–100.0)
Monocytes Absolute: 0.8 10*3/uL (ref 0.1–1.0)
Monocytes Relative: 10 %
Neutro Abs: 5.9 10*3/uL (ref 1.7–7.7)
Neutrophils Relative %: 70 %
Platelets: 244 10*3/uL (ref 150–400)
RBC: 4.11 MIL/uL (ref 3.87–5.11)
RDW: 12.4 % (ref 11.5–15.5)
WBC: 8.2 10*3/uL (ref 4.0–10.5)
nRBC: 0 % (ref 0.0–0.2)

## 2023-08-10 LAB — COMPREHENSIVE METABOLIC PANEL
ALT: 12 U/L (ref 0–44)
AST: 24 U/L (ref 15–41)
Albumin: 3.8 g/dL (ref 3.5–5.0)
Alkaline Phosphatase: 66 U/L (ref 38–126)
Anion gap: 9 (ref 5–15)
BUN: 10 mg/dL (ref 6–20)
CO2: 21 mmol/L — ABNORMAL LOW (ref 22–32)
Calcium: 8.9 mg/dL (ref 8.9–10.3)
Chloride: 106 mmol/L (ref 98–111)
Creatinine, Ser: 0.74 mg/dL (ref 0.44–1.00)
GFR, Estimated: >60 mL/min (ref >60)
Glucose, Bld: 88 mg/dL (ref 70–99)
Potassium: 5.3 mmol/L — ABNORMAL HIGH (ref 3.5–5.1)
Sodium: 136 mmol/L (ref 135–145)
Total Bilirubin: 1.1 mg/dL (ref 0.0–1.2)
Total Protein: 7.6 g/dL (ref 6.5–8.1)

## 2023-08-10 LAB — LIPASE, BLOOD: Lipase: 53 U/L — ABNORMAL HIGH (ref 11–51)

## 2023-08-10 LAB — RESP PANEL BY RT-PCR (RSV, FLU A&B, COVID)  RVPGX2
Influenza A by PCR: NEGATIVE
Influenza B by PCR: NEGATIVE
Resp Syncytial Virus by PCR: NEGATIVE
SARS Coronavirus 2 by RT PCR: NEGATIVE

## 2023-08-10 LAB — HCG, QUANTITATIVE, PREGNANCY: hCG, Beta Chain, Quant, S: <1 m[IU]/mL (ref <5)

## 2023-08-10 MED ORDER — METHOCARBAMOL 500 MG PO TABS
1000.0000 mg | ORAL_TABLET | Freq: Once | ORAL | Status: AC
  Administered 2023-08-10: 1000 mg via ORAL
  Filled 2023-08-10: qty 2

## 2023-08-10 MED ORDER — IOHEXOL 350 MG/ML SOLN
75.0000 mL | Freq: Once | INTRAVENOUS | Status: AC | PRN
  Administered 2023-08-10: 75 mL via INTRAVENOUS

## 2023-08-10 MED ORDER — LIDOCAINE 5 % EX PTCH: 1.0000 | MEDICATED_PATCH | CUTANEOUS | 0 refills | Status: DC

## 2023-08-10 MED ORDER — METOCLOPRAMIDE HCL 5 MG/ML IJ SOLN
10.0000 mg | Freq: Once | INTRAMUSCULAR | Status: DC
  Filled 2023-08-10: qty 2

## 2023-08-10 MED ORDER — SODIUM CHLORIDE 0.9 % IV BOLUS
1000.0000 mL | Freq: Once | INTRAVENOUS | Status: AC
  Administered 2023-08-10: 1000 mL via INTRAVENOUS

## 2023-08-10 MED ORDER — DIPHENHYDRAMINE HCL 50 MG/ML IJ SOLN
25.0000 mg | Freq: Once | INTRAMUSCULAR | Status: AC
  Administered 2023-08-10: 25 mg via INTRAVENOUS
  Filled 2023-08-10: qty 1

## 2023-08-10 MED ORDER — METHOCARBAMOL 500 MG PO TABS: 1000.0000 mg | ORAL_TABLET | Freq: Three times a day (TID) | ORAL | 0 refills | Status: DC | PRN

## 2023-08-10 NOTE — ED Triage Notes (Signed)
Recent exposure to RSV and Flu  States now having N/V that started 2 days ago  Not tolerating po intake  Also c/o of neck pain, limited ROM to right side  Radiates to right shouler and axilla   H/o cyclic vomiting and out of phenergan

## 2023-08-10 NOTE — ED Notes (Signed)
 Patient transported to CT

## 2023-08-10 NOTE — ED Provider Notes (Signed)
[facility] Provider Note   CSN: [id number] Arrival date & time: 08/10/23  4098     History  Chief Complaint  Patient presents with   Multiiple Complaints     [facility] is a 38 y.o. female.  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  Patient reports that the pain started about 4 days ago.  She denies any falls or injuries.  After that time she developed nausea and vomiting.  She states that she has been unable to hold down anything since that time.  She reports pain that radiates from the back of the head and mid back down into the right side of the neck and into the right arm.  She reports right arm weakness.  She reports blurry vision in the right eye only, no loss of vision or curtains in the vision.  No lower extremity symptoms.  Initially she thought that she does had a strain in her neck.  She also reports recent exposure to RSV and influenza and was concerned that she is having symptoms from the flu as well.      Home Medications Prior to Admission medications   Medication Sig Start Date End Date Taking? Authorizing Provider  acetaminophen (TYLENOL) 500 MG tablet Take 500 mg by mouth every 6 (six) hours as needed for moderate pain or mild pain.    [provider]  acetaZOLAMIDE (DIAMOX) 250 MG tablet Take 2 tablets (500 mg total) by mouth at bedtime. 01/21/23   Regalado, Belkys A, MD  diphenhydrAMINE (BENADRYL) 25 MG tablet Take 25 mg by mouth every 6 (six) hours as needed for allergies.    [provider]  Multiple Vitamins-Minerals (CERTAVITE/ANTIOXIDANTS) TABS Take 1 tablet by mouth daily. 01/20/23   Regalado, Belkys A, MD  promethazine  (PHENERGAN) 12.5 MG tablet Take 1 tablet (12.5 mg total) by mouth every 8 (eight) hours as needed for up to 5 days for nausea or vomiting. 01/20/23 01/25/23  Regalado, Jon Billings A, MD  topiramate (TOPAMAX) 25 MG tablet Take 1 tablet (25 mg total) by mouth at bedtime. May increase to 50mg  after 7 days 11/03/22 11/03/23  Willy Eddy, MD      Allergies    Dicyclomine, Morphine, Oxycodone-acetaminophen, Peanut-containing drug products, Zofran [ondansetron hcl], Ketorolac, Other, Percocet [oxycodone-acetaminophen], Tomato, Zofran, and Haloperidol    Review of Systems   Review of Systems  Physical Exam Updated Vital Signs BP (!) 153/108 (BP Location: Left Arm)   Pulse 90   Temp 98.2 F (36.8 C)   Resp 18   Ht 5\' 3"  (1.6 m)   Wt 68 kg   LMP 10/19/2019   SpO2 100%   BMI 26.56 kg/m  Physical Exam Vitals and nursing note reviewed.  Constitutional:      Appearance: She is well-developed.  HENT:     Head: Normocephalic and atraumatic.     Right Ear: Tympanic membrane, ear canal and external ear normal.     Left Ear: Tympanic membrane, ear canal and external ear normal.     Nose: Nose normal.     Mouth/Throat:     Pharynx: Uvula midline.  Eyes:     General: Lids are normal.     Extraocular Movements:     Right eye: No  nystagmus.     Left eye: No nystagmus.     Conjunctiva/sclera: Conjunctivae normal.     Pupils: Pupils are equal, round, and reactive to light.  Cardiovascular:     Rate and Rhythm: Normal rate and regular rhythm.  Pulmonary:     Effort: Pulmonary effort is normal.     Breath sounds: Normal breath sounds.  Abdominal:     Palpations: Abdomen is soft.     Tenderness: There is no abdominal tenderness. There is no guarding or rebound.  Musculoskeletal:     Cervical back: Normal range of motion and neck supple. No tenderness or bony tenderness.  Skin:    General: Skin is warm and dry.  Neurological:     Mental Status: She is alert and oriented to person, place, and  time.     GCS: GCS eye subscore is 4. GCS verbal subscore is 5. GCS motor subscore is 6.     Cranial Nerves: No cranial nerve deficit.     Sensory: No sensory deficit.     Motor: Weakness present.     Coordination: Coordination normal.     Gait: Gait normal.     Comments: Weak right hand grip compared to left, push/pull with R upper extremity is inconsistent.      ED Results / Procedures / Treatments   Labs (all labs ordered are listed, but only abnormal results are displayed) Labs Reviewed  RESP PANEL BY RT-PCR (RSV, FLU A&B, COVID)  [id number]  CBC WITH DIFFERENTIAL/PLATELET  COMPREHENSIVE METABOLIC PANEL  LIPASE, BLOOD  HCG, SERUM, QUALITATIVE    EKG None  Radiology No results found.  Procedures Procedures    Medications Ordered in ED Medications  metoCLOPramide (REGLAN) injection 10 mg (has no administration in time range)  diphenhydrAMINE (BENADRYL) injection 25 mg (has no administration in time range)  sodium chloride 0.9 % bolus 1,000 mL (has no administration in time range)    ED Course/ Medical Decision Making/ A&P    Patient seen and examined. History obtained directly from patient. Reviewed previous urgent care notes.   Labs/EKG: Ordered CBC, CMP, lipase, pregnancy, viral panel.  Imaging: Ordered CTA head and neck.  Medications/Fluids: Ordered: Reglan, Benadryl, fluid bolus.   Most recent vital signs reviewed and are as follows: BP (!) 153/108 (BP Location: Left Arm)   Pulse 90   Temp 98.2 F (36.8 C)   Resp 18   Ht 5\' 3"  (1.6 m)   Wt 68 kg   LMP 10/19/2019   SpO2 100%   BMI 26.56 kg/m   Initial impression: Patient with multiple vague symptoms, will need to rule out right upper extremity symptoms, vision symptoms and dizziness due to vascular dissection in the neck.  Imaging pending.  Will treat symptoms.  Patient looks well.  11:57 AM Reassessment performed. Patient appears comfortable, exam unchanged.  She is calling out for additional  medication for pain.  She cannot take Reglan, Compazine, Toradol due to intolerances.  I have ordered p.o. Robaxin.  Labs personally reviewed and interpreted including: CBC unremarkable; CMP shows slightly elevated potassium at 5.3, however with hemolysis, otherwise unremarkable; lipase slightly elevated at 53; viral panel negative.  Most current vital signs reviewed and are as follows: BP (!) 151/94   Pulse 87   Temp 98.2 F (36.8 C)   Resp 18   Ht 5\' 3"  (1.6 m)   Wt 68 kg   LMP 10/19/2019   SpO2 100%   BMI 26.56 kg/m   Plan: Awaiting  CT results.  12:32 PM Reassessment performed. Patient appears stable.  Sitting on bedside, no distress.  Imaging personally visualized and interpreted including: CTA head and neck, agree negative without acute findings.  Reviewed pertinent lab work and imaging with patient at bedside. Questions answered.   Most current vital signs reviewed and are as follows: BP (!) 151/94   Pulse 87   Temp 98.2 F (36.8 C)   Resp 18   Ht 5\' 3"  (1.6 m)   Wt 68 kg   LMP 10/19/2019   SpO2 100%   BMI 26.56 kg/m   Plan: Discharge to home.   Prescriptions written for: Robaxin, Lidoderm patch  Patient counseled on proper use of muscle relaxant medication.  They were told not to drink alcohol, drive any vehicle, or do any dangerous activities while taking this medication.  Patient verbalized understanding.  Other home care instructions discussed: Avoidance of triggers  ED return instructions discussed: Patient counseled to return if they have worsening weakness in their arms or legs, slurred speech, trouble walking or talking, confusion, trouble with their balance, or if they have any other concerns. Patient verbalizes understanding and agrees with plan.   Follow-up instructions discussed: Patient encouraged to follow-up with their PCP/neurologist in 7 days.  She is followed at [facility] in [place].  Discussed that cannot rule out symptoms being  related to intracranial hypertension.  Do not feel that she needs transferred for emergent evaluation or LP today.  She continues taking her Diamox.                                Medical Decision Making Amount and/or Complexity of Data Reviewed Labs: ordered. Radiology: ordered.  Risk Prescription drug management.   In regards to the patient's headache and neck pain, critical differentials were considered including subarachnoid hemorrhage, intracerebral hemorrhage, epidural/subdural hematoma, pituitary apoplexy, vertebral/carotid artery dissection, giant cell arteritis, central venous thrombosis, reversible cerebral vasoconstriction, acute angle closure glaucoma, idiopathic intracranial hypertension, bacterial meningitis, viral encephalitis, carbon monoxide poisoning, posterior reversible encephalopathy syndrome, pre-eclampsia.   Reg flag symptoms related to these causes were considered including systemic symptoms (fever, weight loss), neurologic symptoms (confusion, mental status change, vision change, associated seizure), acute or sudden "thunderclap" onset, patient age 37 or older with new or progressive headache, patient of any age with first headache or change in headache pattern, pregnant or postpartum status, history of HIV or other immunocompromise, history of cancer, headache occurring with exertion, associated neck or shoulder pain, associated traumatic injury, concurrent use of anticoagulation, family history of spontaneous SAH, and concurrent drug use.    Other benign, more common causes of headache were considered including migraine, tension-type headache, cluster headache, referred pain from other cause such as sinus infection, dental pain, trigeminal neuralgia.   On exam, patient has a reassuring neuro exam including baseline mental status, no significant neck pain or meningeal signs, no signs of severe infection or fever.  CTA head and neck ordered to evaluate for possibility of  stenosis or dissection given symptoms.  This was negative for acute findings and reassuring.  Very low concern for CVA at this time.  Patient has outpatient neurology follow-up established.  Encourage close follow-up.  The patient's vital signs, pertinent lab work and imaging were reviewed and interpreted as discussed in the ED course. Hospitalization was considered for further testing, treatments, or serial exams/observation. However as patient is well-appearing, has a stable exam over the course  of their evaluation, and reassuring studies today, I do not feel that they warrant admission at this time. This plan was discussed with the patient who verbalizes agreement and comfort with this plan and seems reliable and able to return to the Emergency Department with worsening or changing symptoms.          Final Clinical Impression(s) / ED Diagnoses Final diagnoses:  Neck pain on right side  Acute nonintractable headache, unspecified headache type  Blurry vision, right eye    Rx / DC Orders ED Discharge Orders          Ordered    methocarbamol (ROBAXIN) 500 MG tablet  Every 8 hours PRN        08/10/23 1230    lidocaine (LIDODERM) 5 %  Every 24 hours        08/10/23 1230              Renne Crigler, PA-C 08/10/23 1234    Maia Plan, MD 08/10/23 [phone]

## 2023-08-10 NOTE — ED Notes (Signed)
Called to room, pt reports she is suffering and needs pain medication. EDP notified

## 2023-08-10 NOTE — Discharge Instructions (Addendum)
Please read and follow all provided instructions.  Your diagnoses today include:  1. Neck pain on right side   2. Acute nonintractable headache, unspecified headache type   3. Blurry vision, right eye     Tests performed today include: CT of your head and neck which was normal and did not show any serious cause of your headache or neck pain Blood cell counts and electrolytes: No problems Vital signs. See below for your results today.   Medications:  Robaxin (methocarbamol) - muscle relaxer medication  DO NOT drive or perform any activities that require you to be awake and alert because this medicine can make you drowsy.   Topical Lidoderm patches  Take any prescribed medications only as directed.  Additional information:  Follow any educational materials contained in this packet.  You are having a headache. No specific cause was found today for your headache. It may have been a migraine or other cause of headache. Stress, anxiety, fatigue, and depression are common triggers for headaches.   Your headache today does not appear to be life-threatening or require hospitalization, but often the exact cause of headaches is not determined in the emergency department. Therefore, follow-up with your doctor is very important to find out what may have caused your headache and whether or not you need any further diagnostic testing or treatment.   Sometimes headaches can appear benign (not harmful), but then more serious symptoms can develop which should prompt an immediate re-evaluation by your doctor or the emergency department.  BE VERY CAREFUL not to take multiple medicines containing Tylenol (also called acetaminophen). Doing so can lead to an overdose which can damage your liver and cause liver failure and possibly death.   Follow-up instructions: Please follow-up with your primary care provider and/or neurologist in the next week for further evaluation of your symptoms.   Return  instructions:  Please return to the Emergency Department if you experience worsening symptoms. Return if the medications do not resolve your headache, if it recurs, or if you have multiple episodes of vomiting or cannot keep down fluids. Return if you have a change from the usual headache. RETURN IMMEDIATELY IF you: Develop a sudden, severe headache Develop confusion or become poorly responsive or faint Develop a fever above 100.74F or problem breathing Have a change in speech, vision, swallowing, or understanding Develop new weakness, numbness, tingling, incoordination in your arms or legs Have a seizure Please return if you have any other emergent concerns.  Additional Information:  Your vital signs today were: BP (!) 151/94   Pulse 87   Temp 98.2 F (36.8 C)   Resp 18   Ht 5\' 3"  (1.6 m)   Wt 68 kg   LMP 10/19/2019   SpO2 100%   BMI 26.56 kg/m  If your blood pressure (BP) was elevated above 135/85 this visit, please have this repeated by your doctor within one month. --------------

## 2023-09-20 ENCOUNTER — Encounter (HOSPITAL_BASED_OUTPATIENT_CLINIC_OR_DEPARTMENT_OTHER): Payer: Self-pay | Admitting: Emergency Medicine

## 2023-09-20 ENCOUNTER — Emergency Department (HOSPITAL_BASED_OUTPATIENT_CLINIC_OR_DEPARTMENT_OTHER)
Admission: EM | Admit: 2023-09-20 | Discharge: 2023-09-21 | Disposition: A | Discharge status: Home / Self Care | Payer: MEDICAID | Attending: Emergency Medicine | Admitting: Emergency Medicine

## 2023-09-20 ENCOUNTER — Other Ambulatory Visit: Payer: Self-pay

## 2023-09-20 ENCOUNTER — Emergency Department (HOSPITAL_BASED_OUTPATIENT_CLINIC_OR_DEPARTMENT_OTHER): Payer: MEDICAID

## 2023-09-20 DIAGNOSIS — R748 Abnormal levels of other serum enzymes: Secondary | ICD-10-CM | POA: Diagnosis not present

## 2023-09-20 DIAGNOSIS — R000 Tachycardia, unspecified: Secondary | ICD-10-CM | POA: Diagnosis not present

## 2023-09-20 DIAGNOSIS — Z91010 Allergy to peanuts: Secondary | ICD-10-CM | POA: Diagnosis not present

## 2023-09-20 DIAGNOSIS — R112 Nausea with vomiting, unspecified: Secondary | ICD-10-CM | POA: Diagnosis present

## 2023-09-20 DIAGNOSIS — R1011 Right upper quadrant pain: Secondary | ICD-10-CM | POA: Diagnosis not present

## 2023-09-20 DIAGNOSIS — R197 Diarrhea, unspecified: Secondary | ICD-10-CM | POA: Diagnosis not present

## 2023-09-20 DIAGNOSIS — D72829 Elevated white blood cell count, unspecified: Secondary | ICD-10-CM | POA: Diagnosis not present

## 2023-09-20 DIAGNOSIS — Z79899 Other long term (current) drug therapy: Secondary | ICD-10-CM | POA: Insufficient documentation

## 2023-09-20 LAB — URINALYSIS, ROUTINE W REFLEX MICROSCOPIC
Glucose, UA: NEGATIVE mg/dL
Hgb urine dipstick: NEGATIVE
Ketones, ur: NEGATIVE mg/dL
Leukocytes,Ua: NEGATIVE
Nitrite: NEGATIVE
Protein, ur: 100 mg/dL — AB
Specific Gravity, Urine: >=1.03 (ref 1.005–1.030)
pH: 5.5 (ref 5.0–8.0)

## 2023-09-20 LAB — URINALYSIS, MICROSCOPIC (REFLEX)

## 2023-09-20 LAB — COMPREHENSIVE METABOLIC PANEL
ALT: 15 U/L (ref 0–44)
AST: 24 U/L (ref 15–41)
Albumin: 4.6 g/dL (ref 3.5–5.0)
Alkaline Phosphatase: 79 U/L (ref 38–126)
Anion gap: 10 (ref 5–15)
BUN: 18 mg/dL (ref 6–20)
CO2: 21 mmol/L — ABNORMAL LOW (ref 22–32)
Calcium: 9.5 mg/dL (ref 8.9–10.3)
Chloride: 103 mmol/L (ref 98–111)
Creatinine, Ser: 1.07 mg/dL — ABNORMAL HIGH (ref 0.44–1.00)
GFR, Estimated: >60 mL/min (ref >60)
Glucose, Bld: 107 mg/dL — ABNORMAL HIGH (ref 70–99)
Potassium: 3.5 mmol/L (ref 3.5–5.1)
Sodium: 134 mmol/L — ABNORMAL LOW (ref 135–145)
Total Bilirubin: 1.1 mg/dL (ref 0.0–1.2)
Total Protein: 8.4 g/dL — ABNORMAL HIGH (ref 6.5–8.1)

## 2023-09-20 LAB — CBC
HCT: 43.9 % (ref 36.0–46.0)
Hemoglobin: 15 g/dL (ref 12.0–15.0)
MCH: 32.1 pg (ref 26.0–34.0)
MCHC: 34.2 g/dL (ref 30.0–36.0)
MCV: 94 fL (ref 80.0–100.0)
Platelets: 325 10*3/uL (ref 150–400)
RBC: 4.67 MIL/uL (ref 3.87–5.11)
RDW: 12.4 % (ref 11.5–15.5)
WBC: 12.3 10*3/uL — ABNORMAL HIGH (ref 4.0–10.5)
nRBC: 0 % (ref 0.0–0.2)

## 2023-09-20 LAB — RAPID URINE DRUG SCREEN, HOSP PERFORMED
Amphetamines: NOT DETECTED
Barbiturates: NOT DETECTED
Benzodiazepines: NOT DETECTED
Cocaine: NOT DETECTED
Opiates: NOT DETECTED
Tetrahydrocannabinol: POSITIVE — AB

## 2023-09-20 LAB — LIPASE, BLOOD: Lipase: 60 U/L — ABNORMAL HIGH (ref 11–51)

## 2023-09-20 MED ORDER — SODIUM CHLORIDE 0.9 % IV SOLN
INTRAVENOUS | Status: DC
Start: 2023-09-20 — End: 2023-09-21

## 2023-09-20 MED ORDER — LORAZEPAM 2 MG/ML IJ SOLN
1.0000 mg | Freq: Once | INTRAMUSCULAR | Status: AC
  Administered 2023-09-20: 1 mg via INTRAVENOUS
  Filled 2023-09-20: qty 1

## 2023-09-20 MED ORDER — PROMETHAZINE HCL 25 MG/ML IJ SOLN
INTRAMUSCULAR | Status: AC
  Administered 2023-09-20: 12.5 mg
  Filled 2023-09-20: qty 1

## 2023-09-20 MED ORDER — PROMETHAZINE HCL 25 MG/ML IJ SOLN
INTRAMUSCULAR | Status: AC
  Filled 2023-09-20: qty 1

## 2023-09-20 MED ORDER — HYOSCYAMINE SULFATE 0.5 MG/ML IJ SOLN
0.5000 mg | Freq: Once | INTRAMUSCULAR | Status: DC
Start: 2023-09-20 — End: 2023-09-20

## 2023-09-20 MED ORDER — HYDROMORPHONE HCL 1 MG/ML IJ SOLN
0.5000 mg | Freq: Once | INTRAMUSCULAR | Status: DC
  Filled 2023-09-20: qty 1

## 2023-09-20 MED ORDER — HYDROMORPHONE HCL 1 MG/ML IJ SOLN
1.0000 mg | Freq: Once | INTRAMUSCULAR | Status: AC
  Administered 2023-09-20: 1 mg via INTRAVENOUS
  Filled 2023-09-20: qty 1

## 2023-09-20 MED ORDER — LACTATED RINGERS IV BOLUS
1000.0000 mL | Freq: Once | INTRAVENOUS | Status: AC
  Administered 2023-09-20: 1000 mL via INTRAVENOUS

## 2023-09-20 MED ORDER — SODIUM CHLORIDE 0.9 % IV SOLN
12.5000 mg | Freq: Once | INTRAVENOUS | Status: AC
  Administered 2023-09-20: 12.5 mg via INTRAVENOUS
  Filled 2023-09-20: qty 0.5

## 2023-09-20 MED ORDER — HYDROMORPHONE HCL 1 MG/ML IJ SOLN
0.5000 mg | Freq: Once | INTRAMUSCULAR | Status: AC
  Administered 2023-09-20: 0.5 mg via INTRAVENOUS
  Filled 2023-09-20: qty 1

## 2023-09-20 MED ORDER — FAMOTIDINE IN NACL 20-0.9 MG/50ML-% IV SOLN
20.0000 mg | Freq: Once | INTRAVENOUS | Status: AC
  Administered 2023-09-20: 20 mg via INTRAVENOUS
  Filled 2023-09-20: qty 50

## 2023-09-20 MED ORDER — SODIUM CHLORIDE 0.9 % IV SOLN
12.5000 mg | Freq: Four times a day (QID) | INTRAVENOUS | Status: DC | PRN
  Administered 2023-09-20: 12.5 mg via INTRAVENOUS
  Filled 2023-09-20: qty 0.5

## 2023-09-20 MED ORDER — HYOSCYAMINE SULFATE 0.125 MG SL SUBL
0.2500 mg | SUBLINGUAL_TABLET | Freq: Once | SUBLINGUAL | Status: AC
  Administered 2023-09-20: 0.25 mg via SUBLINGUAL
  Filled 2023-09-20: qty 2

## 2023-09-20 MED ORDER — IOHEXOL 300 MG/ML  SOLN
100.0000 mL | Freq: Once | INTRAMUSCULAR | Status: AC | PRN
  Administered 2023-09-20: 100 mL via INTRAVENOUS

## 2023-09-20 NOTE — ED Notes (Signed)
 Patient transported to CT

## 2023-09-20 NOTE — ED Notes (Signed)
 Re Paged Consult for Hospitalist several times, ED Doctor still waiting for a call back.

## 2023-09-20 NOTE — ED Triage Notes (Signed)
 Pt POV in wheelchair- pt reports hx of cyclical vomitting- c/o n/v/d x2 days. Unsure if she has had fever, denies known sick contacts.

## 2023-09-20 NOTE — ED Triage Notes (Addendum)
 Charted in error.

## 2023-09-20 NOTE — ED Provider Notes (Signed)
 [place] [facility] Provider Note   CSN: [id number] Arrival date & time: 09/20/23  1311     History  Chief Complaint  Patient presents with   Emesis   Diarrhea    [facility] is a 38 y.o. female.  Patient is a 38 year old female with a past medical history of cyclical vomiting presenting to the emergency department with vomiting and diarrhea.  Patient states that she started have diarrhea 2 days ago and then started to have nausea and vomiting yesterday.  She states she has had associated right upper quadrant pain.  She states that she has had her gallbladder removed.  She denies any fever.  She denies any black or bloody stools.  She states that this feels similar to her cyclical vomiting and tried to take her Phenergan at home without any relief.  She denies any current marijuana use to me.  The history is provided by the patient.  Emesis Associated symptoms: diarrhea   Diarrhea Associated symptoms: vomiting        Home Medications Prior to Admission medications   Medication Sig Start Date End Date Taking? Authorizing Provider  acetaminophen (TYLENOL) 500 MG tablet Take 500 mg by mouth every 6 (six) hours as needed for moderate pain or mild pain.    [provider]  acetaZOLAMIDE (DIAMOX) 250 MG tablet Take 2 tablets (500 mg total) by mouth at bedtime. 01/21/23   Regalado, Belkys A, MD  diphenhydrAMINE (BENADRYL) 25 MG tablet Take 25 mg by mouth every 6 (six) hours as needed for allergies.    [provider]  lidocaine (LIDODERM) 5 % Place 1 patch onto the skin daily. Remove & Discard patch within 12 hours or as directed by MD 08/10/23   Renne Crigler, PA-C  methocarbamol (ROBAXIN) 500 MG tablet Take 2 tablets (1,000 mg total) by mouth every 8 (eight) hours as needed for muscle spasms. 08/10/23   Renne Crigler, PA-C  Multiple Vitamins-Minerals (CERTAVITE/ANTIOXIDANTS) TABS Take 1 tablet by mouth daily. 01/20/23    Regalado, Belkys A, MD  promethazine (PHENERGAN) 12.5 MG tablet Take 1 tablet (12.5 mg total) by mouth every 8 (eight) hours as needed for up to 5 days for nausea or vomiting. 01/20/23 01/25/23  Regalado, Jon Billings A, MD  topiramate (TOPAMAX) 25 MG tablet Take 1 tablet (25 mg total) by mouth at bedtime. May increase to 50mg  after 7 days 11/03/22 11/03/23  Willy Eddy, MD      Allergies    Dicyclomine, Morphine, Oxycodone-acetaminophen, Peanut-containing drug products, Zofran [ondansetron hcl], Ketorolac, Other, Percocet [oxycodone-acetaminophen], Tomato, Zofran, and Haloperidol    Review of Systems   Review of Systems  Gastrointestinal:  Positive for diarrhea and vomiting.    Physical Exam Updated Vital Signs BP (!) 153/132   Pulse (!) 132   Temp 97.9 F (36.6 C)   Resp (!) 24   Ht 5\' 3"  (1.6 m)   Wt 72.6 kg   LMP 10/19/2019   SpO2 100%   BMI 28.34 kg/m  Physical Exam Vitals and nursing note reviewed.  Constitutional:      General: She is not in acute distress.    Appearance: Normal appearance.     Comments: Actively vomiting in the room  HENT:     Head: Normocephalic and atraumatic.     Nose: Nose normal.     Mouth/Throat:     Mouth: Mucous membranes are moist.     Pharynx: Oropharynx is clear.  Eyes:  Extraocular Movements: Extraocular movements intact.     Conjunctiva/sclera: Conjunctivae normal.  Cardiovascular:     Rate and Rhythm: Regular rhythm. Tachycardia present.     Heart sounds: Normal heart sounds.  Pulmonary:     Effort: Pulmonary effort is normal.     Breath sounds: Normal breath sounds.  Abdominal:     General: Abdomen is flat.     Palpations: Abdomen is soft.     Tenderness: There is no abdominal tenderness.  Musculoskeletal:        General: Normal range of motion.     Cervical back: Normal range of motion.  Skin:    General: Skin is warm and dry.  Neurological:     General: No focal deficit present.     Mental Status: She is alert and  oriented to person, place, and time.  Psychiatric:        Mood and Affect: Mood normal.        Behavior: Behavior normal.     ED Results / Procedures / Treatments   Labs (all labs ordered are listed, but only abnormal results are displayed) Labs Reviewed  LIPASE, BLOOD - Abnormal; Notable for the following components:      Result Value   Lipase 60 (*)    All other components within normal limits  COMPREHENSIVE METABOLIC PANEL - Abnormal; Notable for the following components:   Sodium 134 (*)    CO2 21 (*)    Glucose, Bld 107 (*)    Creatinine, Ser 1.07 (*)    Total Protein 8.4 (*)    All other components within normal limits  CBC - Abnormal; Notable for the following components:   WBC 12.3 (*)    All other components within normal limits  URINALYSIS, ROUTINE W REFLEX MICROSCOPIC - Abnormal; Notable for the following components:   Bilirubin Urine SMALL (*)    Protein, ur 100 (*)    All other components within normal limits  RAPID URINE DRUG SCREEN, HOSP PERFORMED - Abnormal; Notable for the following components:   Tetrahydrocannabinol POSITIVE (*)    All other components within normal limits  URINALYSIS, MICROSCOPIC (REFLEX) - Abnormal; Notable for the following components:   Bacteria, UA FEW (*)    All other components within normal limits    EKG EKG Interpretation Date/Time:  Thursday September 20 2023 13:42:32 EDT Ventricular Rate:  115 PR Interval:  115 QRS Duration:  89 QT Interval:  343 QTC Calculation: 475 R Axis:   82  Text Interpretation: Sinus tachycardia Biatrial enlargement Left ventricular hypertrophy ST depr, consider ischemia, inferior leads Baseline wander in lead(s) V1 Rate related ST depressions otherwise no signficant change Confirmed by Elayne Snare ([id number]) on 09/20/2023 2:12:30 PM  Radiology No results found.  Procedures Procedures    Medications Ordered in ED Medications  promethazine (PHENERGAN) 12.5 mg in sodium chloride 0.9 % 50 mL IVPB  (12.5 mg Intravenous New Bag/Given 09/20/23 1502)  promethazine (PHENERGAN) 25 MG/ML injection (25 mg  Not Given 09/20/23 1458)  famotidine (PEPCID) IVPB 20 mg premix (0 mg Intravenous Stopped 09/20/23 1503)  lactated ringers bolus 1,000 mL (1,000 mLs Intravenous New Bag/Given 09/20/23 1420)  HYDROmorphone (DILAUDID) injection 0.5 mg (0.5 mg Intravenous Given 09/20/23 1421)  hyoscyamine (LEVSIN SL) SL tablet 0.25 mg (0.25 mg Sublingual Given 09/20/23 1508)    ED Course/ Medical Decision Making/ A&P Clinical Course as of 09/20/23 1512  Thu Sep 20, 2023  1458 Upon reassessment, patient still reports pain and nausea. Has not  received phenergan yet and will receive this with levsin.  [VK]  1511 Patient signed out to Dr. Deretha Emory pending reassessment after meds for disposition. [VK]    Clinical Course User Index [VK] Rexford Maus, DO                                 Medical Decision Making This patient presents to the ED with chief complaint(s) of N/V/D with pertinent past medical history of cyclical vomiting which further complicates the presenting complaint. The complaint involves an extensive differential diagnosis and also carries with it a high risk of complications and morbidity.    The differential diagnosis includes cyclical vomiting, cannabinoid hyperemesis, dehydration, electrolyte abnormality, hepatitis, pancreatitis, gastroenteritis, viral syndrome  Additional history obtained: Additional history obtained from N/A Records reviewed multiple recent ED records  ED Course and Reassessment: On patient's arrival she is tachycardic and actively vomiting but otherwise in no acute distress.  EKG showed sinus tachycardia without acute ischemic changes.  She had labs initiated in triage that showed mild leukocytosis and trace elevation of her lipase otherwise without significant abnormality.  Patient will be started on symptomatic management with pain, nausea and fluid control and will be  closely reassessed.  Independent labs interpretation:  The following labs were independently interpreted: mild increased Cr and mildly elevated lipase  Independent visualization of imaging: - N/A    Amount and/or Complexity of Data Reviewed Labs: ordered.  Risk Prescription drug management.          Final Clinical Impression(s) / ED Diagnoses Final diagnoses:  Cannabinoid hyperemesis syndrome    Rx / DC Orders ED Discharge Orders     None         Rexford Maus, DO 09/20/23 1512

## 2023-09-20 NOTE — Plan of Care (Signed)
 Plan of Care Note for accepted transfer   Patient name: Sherry Key WUJ:[id number] DOB: 1985/11/16  Facility requesting transfer: [facility] ED Requesting Provider: Dr. Deretha Emory Reason for transfer: Intractable nausea and vomiting, cannabinoid hyperemesis syndrome Facility course: 38 year old female with history of Crohn's disease, cannabinoid hyperemesis syndrome, cyclical vomiting, endometriosis, fibroids, history of partial hysterectomy, hypertension, PCOS, idiopathic intracranial hypertension, C. difficile colitis presented to the ED with complaints of abdominal pain, vomiting, and diarrhea.  Tachycardic on arrival but afebrile.  Blood pressure elevated with systolic up to 200s.  Labs notable for WBC count 12.3, sodium 134, bicarb 21, glucose 107, creatinine 1.0, normal LFTs, lipase 60, UA not suggestive of infection, UDS positive for THC.    CT abdomen pelvis showing: "IMPRESSION: 1. No acute or active process within the abdomen or pelvis. 2. Stable 8 mm cyst within the tail of the pancreas. Correlation with follow-up abdomen and pelvis CT in 1 year is recommended to determine stability. This recommendation follows ACR consensus guidelines: Management of Incidental Pancreatic Cysts: A White Paper of the ACR Incidental Findings Committee. J Am Coll Radiol 2017;14:911-923."  Patient was given multiple doses of IV Dilaudid and Phenergan.  She was also given hyoscyamine, Ativan, Pepcid, and 2 L IV fluid boluses.  Tachycardia improved after IV fluids. Most recent SBP in 150s.  Plan of care: The patient is accepted for admission to Progressive unit at [facility].  [facility] will assume care on arrival to accepting facility. Until arrival, care as per EDP. However, [facility] available 24/7 for questions and assistance.  Check [email or web address] for on-call coverage.  Nursing staff, please call [facility] Admits & Consults System-Wide number under Amion on patient's arrival so appropriate  admitting provider can evaluate the pt.

## 2023-09-20 NOTE — ED Notes (Addendum)
 I do not feel it is appropriate to administer Dilaudid at present due to pt condition. Wong Baker face scale, pt is a 0/10 due to suppressed GCS. EDP updated and agreed.

## 2023-09-20 NOTE — ED Provider Notes (Addendum)
 Patient still with persistent vomiting.  Patient claims that usually Dilaudid and Ativan helps the most.  She is not able to handle anything like Haldol or droperidol she states.  Will go ahead and order a dose of Ativan and and Dilaudid IV.  Urine drug screen positive for THC urinalysis negative lipase up a little bit complete metabolic panel normal white count 12.3 but otherwise negative.   Vanetta Mulders, MD 09/20/23 [phone]  Patient improved a little bit but still complaining of lots of abdominal pain vomiting is not completely stopped but has slowed down some.  Will give another liter of fluid we will give another dose of Dilaudid and will get CT scan abdomen pelvis.  Patient may require admission since were having difficulty controlling the hyperemesis.   Vanetta Mulders, MD 09/20/23 1802   CT abdomen pelvis without anything acute.  Some cystic structure in the tail the pancreas that we will just need some outpatient follow-up not pertaining to anything going on tonight.  Will contact hospitalist for admission.   Vanetta Mulders, MD 09/20/23 2204

## 2023-09-20 NOTE — ED Notes (Signed)
 Pt was lying in bed and not responding when I walked in for the USIV. It took repetitive questioning to have the patient answer. She was coherent immediately, but required loud verbal stimulus to respond. The pt needed multiple prompts to roll supine so I could initiate the USIV in her ULE. Upon the IV stick, the pt did not flinch at all, and was having gasping breaths. Her lips were pale and she was presenting as under the influence due to her suppressed respirations, pallor, and inability to keep her eyes open without stimulus.

## 2023-09-21 NOTE — Discharge Instructions (Signed)
 Continue home medications as previously prescribed.  Return to the ER if you experience any new and/or concerning issues.

## 2023-09-21 NOTE — ED Notes (Signed)
 Pt. States that she is feeling better and does not want to be admitted. EDP made away and pt. discharged

## 2023-09-22 ENCOUNTER — Emergency Department (HOSPITAL_COMMUNITY): Payer: MEDICAID

## 2023-09-22 ENCOUNTER — Observation Stay (HOSPITAL_COMMUNITY)
Admission: EM | Admit: 2023-09-22 | Discharge: 2023-09-24 | Disposition: A | Discharge status: Home / Self Care | Payer: MEDICAID | Attending: Internal Medicine | Admitting: Internal Medicine

## 2023-09-22 ENCOUNTER — Observation Stay (HOSPITAL_COMMUNITY): Payer: MEDICAID

## 2023-09-22 DIAGNOSIS — E860 Dehydration: Secondary | ICD-10-CM | POA: Diagnosis present

## 2023-09-22 DIAGNOSIS — Z79899 Other long term (current) drug therapy: Secondary | ICD-10-CM | POA: Insufficient documentation

## 2023-09-22 DIAGNOSIS — R1116 Cannabis hyperemesis syndrome: Secondary | ICD-10-CM

## 2023-09-22 DIAGNOSIS — F12188 Cannabis abuse with other cannabis-induced disorder: Secondary | ICD-10-CM | POA: Insufficient documentation

## 2023-09-22 DIAGNOSIS — R109 Unspecified abdominal pain: Secondary | ICD-10-CM | POA: Diagnosis present

## 2023-09-22 DIAGNOSIS — G932 Benign intracranial hypertension: Secondary | ICD-10-CM | POA: Diagnosis present

## 2023-09-22 DIAGNOSIS — R197 Diarrhea, unspecified: Secondary | ICD-10-CM

## 2023-09-22 DIAGNOSIS — E876 Hypokalemia: Secondary | ICD-10-CM | POA: Diagnosis not present

## 2023-09-22 DIAGNOSIS — Z1152 Encounter for screening for COVID-19: Secondary | ICD-10-CM | POA: Insufficient documentation

## 2023-09-22 DIAGNOSIS — F1290 Cannabis use, unspecified, uncomplicated: Principal | ICD-10-CM

## 2023-09-22 DIAGNOSIS — F1210 Cannabis abuse, uncomplicated: Secondary | ICD-10-CM | POA: Diagnosis not present

## 2023-09-22 DIAGNOSIS — R112 Nausea with vomiting, unspecified: Principal | ICD-10-CM | POA: Diagnosis present

## 2023-09-22 DIAGNOSIS — I10 Essential (primary) hypertension: Secondary | ICD-10-CM | POA: Diagnosis present

## 2023-09-22 DIAGNOSIS — R1013 Epigastric pain: Secondary | ICD-10-CM | POA: Insufficient documentation

## 2023-09-22 DIAGNOSIS — F12288 Cannabis dependence with other cannabis-induced disorder: Secondary | ICD-10-CM

## 2023-09-22 DIAGNOSIS — D72829 Elevated white blood cell count, unspecified: Secondary | ICD-10-CM | POA: Diagnosis present

## 2023-09-22 DIAGNOSIS — R1110 Vomiting, unspecified: Secondary | ICD-10-CM

## 2023-09-22 DIAGNOSIS — Z87891 Personal history of nicotine dependence: Secondary | ICD-10-CM | POA: Diagnosis not present

## 2023-09-22 LAB — COMPREHENSIVE METABOLIC PANEL
ALT: 14 U/L (ref 0–44)
AST: 21 U/L (ref 15–41)
Albumin: 4.7 g/dL (ref 3.5–5.0)
Alkaline Phosphatase: 70 U/L (ref 38–126)
Anion gap: 12 (ref 5–15)
BUN: 12 mg/dL (ref 6–20)
CO2: 22 mmol/L (ref 22–32)
Calcium: 9.7 mg/dL (ref 8.9–10.3)
Chloride: 104 mmol/L (ref 98–111)
Creatinine, Ser: 0.86 mg/dL (ref 0.44–1.00)
GFR, Estimated: >60 mL/min (ref >60)
Glucose, Bld: 124 mg/dL — ABNORMAL HIGH (ref 70–99)
Potassium: 3.2 mmol/L — ABNORMAL LOW (ref 3.5–5.1)
Sodium: 138 mmol/L (ref 135–145)
Total Bilirubin: 1.3 mg/dL — ABNORMAL HIGH (ref 0.0–1.2)
Total Protein: 8.4 g/dL — ABNORMAL HIGH (ref 6.5–8.1)

## 2023-09-22 LAB — CBC WITH DIFFERENTIAL/PLATELET
Abs Immature Granulocytes: 0.04 10*3/uL (ref 0.00–0.07)
Basophils Absolute: 0.1 10*3/uL (ref 0.0–0.1)
Basophils Relative: 0 %
Eosinophils Absolute: 0.1 10*3/uL (ref 0.0–0.5)
Eosinophils Relative: 1 %
HCT: 44.6 % (ref 36.0–46.0)
Hemoglobin: 14.3 g/dL (ref 12.0–15.0)
Immature Granulocytes: 0 %
Lymphocytes Relative: 10 %
Lymphs Abs: 1.2 10*3/uL (ref 0.7–4.0)
MCH: 31.4 pg (ref 26.0–34.0)
MCHC: 32.1 g/dL (ref 30.0–36.0)
MCV: 98 fL (ref 80.0–100.0)
Monocytes Absolute: 0.7 10*3/uL (ref 0.1–1.0)
Monocytes Relative: 6 %
Neutro Abs: 10 10*3/uL — ABNORMAL HIGH (ref 1.7–7.7)
Neutrophils Relative %: 83 %
Platelets: 301 10*3/uL (ref 150–400)
RBC: 4.55 MIL/uL (ref 3.87–5.11)
RDW: 12.2 % (ref 11.5–15.5)
WBC: 12.1 10*3/uL — ABNORMAL HIGH (ref 4.0–10.5)
nRBC: 0 % (ref 0.0–0.2)

## 2023-09-22 LAB — URINALYSIS, ROUTINE W REFLEX MICROSCOPIC
Bacteria, UA: NONE SEEN
Bilirubin Urine: NEGATIVE
Glucose, UA: NEGATIVE mg/dL
Ketones, ur: 20 mg/dL — AB
Leukocytes,Ua: NEGATIVE
Nitrite: NEGATIVE
Protein, ur: NEGATIVE mg/dL
Specific Gravity, Urine: >1.046 — ABNORMAL HIGH (ref 1.005–1.030)
pH: 7 (ref 5.0–8.0)

## 2023-09-22 LAB — RESP PANEL BY RT-PCR (RSV, FLU A&B, COVID)  RVPGX2
Influenza A by PCR: NEGATIVE
Influenza B by PCR: NEGATIVE
Resp Syncytial Virus by PCR: NEGATIVE
SARS Coronavirus 2 by RT PCR: NEGATIVE

## 2023-09-22 LAB — PHOSPHORUS: Phosphorus: 1.6 mg/dL — ABNORMAL LOW (ref 2.5–4.6)

## 2023-09-22 LAB — RAPID URINE DRUG SCREEN, HOSP PERFORMED
Amphetamines: NOT DETECTED
Barbiturates: NOT DETECTED
Benzodiazepines: NOT DETECTED
Cocaine: NOT DETECTED
Opiates: NOT DETECTED
Tetrahydrocannabinol: POSITIVE — AB

## 2023-09-22 LAB — HCG, SERUM, QUALITATIVE: Preg, Serum: NEGATIVE

## 2023-09-22 LAB — LIPASE, BLOOD: Lipase: 27 U/L (ref 11–51)

## 2023-09-22 LAB — LACTIC ACID, PLASMA: Lactic Acid, Venous: 1.1 mmol/L (ref 0.5–1.9)

## 2023-09-22 LAB — TSH: TSH: 0.561 u[IU]/mL (ref 0.350–4.500)

## 2023-09-22 LAB — MAGNESIUM: Magnesium: 1.8 mg/dL (ref 1.7–2.4)

## 2023-09-22 LAB — CK: Total CK: 150 U/L (ref 38–234)

## 2023-09-22 MED ORDER — POTASSIUM CHLORIDE 10 MEQ/100ML IV SOLN
10.0000 meq | Freq: Once | INTRAVENOUS | Status: AC
  Administered 2023-09-22: 10 meq via INTRAVENOUS
  Filled 2023-09-22: qty 100

## 2023-09-22 MED ORDER — FENTANYL CITRATE PF 50 MCG/ML IJ SOSY: 25.0000 ug | PREFILLED_SYRINGE | INTRAMUSCULAR | Status: DC | PRN

## 2023-09-22 MED ORDER — HYDROMORPHONE HCL 1 MG/ML IJ SOLN
0.5000 mg | INTRAMUSCULAR | Status: DC | PRN
Start: 2023-09-22 — End: 2023-09-23

## 2023-09-22 MED ORDER — FENTANYL CITRATE PF 50 MCG/ML IJ SOSY
50.0000 ug | PREFILLED_SYRINGE | Freq: Once | INTRAMUSCULAR | Status: AC
  Administered 2023-09-22: 50 ug via INTRAVENOUS
  Filled 2023-09-22: qty 1

## 2023-09-22 MED ORDER — IOHEXOL 300 MG/ML  SOLN
100.0000 mL | Freq: Once | INTRAMUSCULAR | Status: AC | PRN
  Administered 2023-09-22: 100 mL via INTRAVENOUS

## 2023-09-22 MED ORDER — SODIUM CHLORIDE 0.9 % IV SOLN
12.5000 mg | Freq: Four times a day (QID) | INTRAVENOUS | Status: DC | PRN
  Administered 2023-09-22 (×2): 12.5 mg via INTRAVENOUS
  Filled 2023-09-22 (×2): qty 12.5

## 2023-09-22 MED ORDER — HYDROMORPHONE HCL 1 MG/ML IJ SOLN
0.5000 mg | Freq: Once | INTRAMUSCULAR | Status: AC
  Administered 2023-09-22: 0.5 mg via INTRAVENOUS
  Filled 2023-09-22: qty 1

## 2023-09-22 MED ORDER — HALOPERIDOL LACTATE 5 MG/ML IJ SOLN
2.0000 mg | Freq: Once | INTRAMUSCULAR | Status: DC
  Filled 2023-09-22: qty 1

## 2023-09-22 MED ORDER — LACTATED RINGERS IV BOLUS
1000.0000 mL | Freq: Once | INTRAVENOUS | Status: AC
  Administered 2023-09-22: 1000 mL via INTRAVENOUS

## 2023-09-22 MED ORDER — TOPIRAMATE 25 MG PO TABS
50.0000 mg | ORAL_TABLET | Freq: Every day | ORAL | Status: DC
  Administered 2023-09-22: 50 mg via ORAL
  Filled 2023-09-22: qty 2

## 2023-09-22 MED ORDER — CAPSAICIN 0.025 % EX CREA
TOPICAL_CREAM | Freq: Once | CUTANEOUS | Status: AC
  Filled 2023-09-22: qty 60

## 2023-09-22 MED ORDER — HYDRALAZINE HCL 20 MG/ML IJ SOLN
10.0000 mg | INTRAMUSCULAR | Status: DC | PRN
  Administered 2023-09-22: 10 mg via INTRAVENOUS
  Filled 2023-09-22: qty 1

## 2023-09-22 MED ORDER — SODIUM CHLORIDE 0.9 % IV SOLN: INTRAVENOUS | Status: DC

## 2023-09-22 MED ORDER — IOHEXOL 300 MG/ML  SOLN
100.0000 mL | Freq: Once | INTRAMUSCULAR | Status: AC | PRN
  Administered 2023-09-22: 80 mL via INTRAVENOUS

## 2023-09-22 MED ORDER — METOCLOPRAMIDE HCL 5 MG/ML IJ SOLN: 5.0000 mg | Freq: Four times a day (QID) | INTRAMUSCULAR | Status: DC | PRN

## 2023-09-22 MED ORDER — ACETAMINOPHEN 650 MG RE SUPP
650.0000 mg | Freq: Once | RECTAL | Status: DC
  Filled 2023-09-22: qty 1

## 2023-09-22 MED ORDER — DIPHENHYDRAMINE HCL 50 MG/ML IJ SOLN
25.0000 mg | Freq: Once | INTRAMUSCULAR | Status: AC
  Administered 2023-09-22: 25 mg via INTRAVENOUS
  Filled 2023-09-22: qty 1

## 2023-09-22 NOTE — ED Notes (Signed)
 Request phenergan

## 2023-09-22 NOTE — Assessment & Plan Note (Signed)
 Likely reactive and in setting of hemoconcentration and dehydration

## 2023-09-22 NOTE — Assessment & Plan Note (Signed)
-   will replace electrolytes and repeat  check Mg, phos and Ca level and replace as needed Monitor on telemetry   Lab Results  Component Value Date   K 3.2 (L) 09/22/2023     Lab Results  Component Value Date   CREATININE 0.86 09/22/2023   Lab Results  Component Value Date   MG 2.0 01/17/2023   Lab Results  Component Value Date   CALCIUM 9.7 09/22/2023

## 2023-09-22 NOTE — Assessment & Plan Note (Addendum)
 Supportive management Phenergan Reglan Rehydration

## 2023-09-22 NOTE — H&P (Signed)
 CHASADY LONGWELL Key:[id number] DOB: 10-11-1985 DOA: 09/22/2023     PCP: Oneita Hurt, No   Outpatient Specialists:   NEurology at [facility]   Patient arrived to ER on 09/22/23 at 1446 Referred by Attending Therisa Doyne, MD   Patient coming from:    home Lives With family    Chief Complaint:   Chief Complaint  Patient presents with   Emesis    HPI: Sherry Key is a 38 y.o. female with medical history significant of cannabinoid hyperemesis syndrome, Crohn's disease, endometriosis, hypertension sp cholecystectomy and hysterectomy.  Idiopathic intracranial hypertension    Presented with  nausea vomiting abdominal pain Presents with intractable nausea and vomiting in the setting of marijuana use reports also abd pain. Pt with hx of cannabinoid hyperemesis syndrome but also Crohn's disease and endometriosis,  No fever no contipation Patient reports that she has been having significant diarrhea as well 1 question states that she could not tolerate her Topamax also endorses a headache and heavy sensation in her head Her blood pressure has been elevated but she states that this is typical for her when she is having severe pain Reports her symptoms best controlled with Phenergan and Dilaudid for IV  Denies significant ETOH intake   Does not smoke states last THC use was Aug 24 2023  Lab Results  Component Value Date   SARSCOV2NAA NEGATIVE 09/22/2023   SARSCOV2NAA NEGATIVE 08/10/2023   SARSCOV2NAA NEGATIVE 01/16/2023   SARSCOV2NAA POSITIVE (A) 08/13/2022        Regarding pertinent Chronic problems:    HTN not on meds      Idiopathic intracranial hypertension no longer takes Diamox Patient 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   While in ER: Clinical Course as of 09/22/23 2007  Sat Sep 22, 2023  1851 Upon reevaluation, patient still having nausea and vomiting.  Suspect cannabinoid hyperemesis syndrome.  Will plan on admission for  further management. [AF]    Clinical Course User Index [AF] Arabella Merles, PA-C     Lab Orders         Resp panel by RT-PCR (RSV, Flu A&B, Covid) Anterior Nasal Swab         CBC with Differential         Comprehensive metabolic panel         Lipase, blood         Urinalysis, Routine w reflex microscopic -Urine, Clean Catch         Rapid urine drug screen (hospital performed)         hCG, serum, qualitative      CTabd/pelvis -  1. No acute intra-abdominal or intrapelvic process. Normal appendix. 2. Stable 9 mm cystic lesion within the pancreatic tail. This has demonstrated no significant change since exams dating to 2019, and can be considered benign. No specific imaging follow-up recommended.    Following Medications were ordered in ER: Medications  promethazine (PHENERGAN) 12.5 mg in sodium chloride 0.9 % 50 mL IVPB (0 mg Intravenous Stopped 09/22/23 1640)  acetaminophen (TYLENOL) suppository 650 mg (650 mg Rectal Patient Refused/Not Given 09/22/23 1907)  potassium chloride 10 mEq in 100 mL IVPB (10 mEq Intravenous New Bag/Given 09/22/23 1917)  haloperidol lactate (HALDOL) injection 2 mg (2 mg Intravenous Patient Refused/Not Given 09/22/23 1918)  capsaicin (ZOSTRIX) 0.025 % cream ( Topical Given 09/22/23 1546)  fentaNYL (SUBLIMAZE) injection 50 mcg (50 mcg Intravenous Given 09/22/23 1524)  diphenhydrAMINE (BENADRYL) injection 25 mg (25  mg Intravenous Given 09/22/23 1523)  lactated ringers bolus 1,000 mL (0 mLs Intravenous Stopped 09/22/23 2006)  fentaNYL (SUBLIMAZE) injection 50 mcg (50 mcg Intravenous Given 09/22/23 1638)  potassium chloride 10 mEq in 100 mL IVPB (0 mEq Intravenous Stopped 09/22/23 1849)  iohexol (OMNIPAQUE) 300 MG/ML solution 100 mL (100 mLs Intravenous Contrast Given 09/22/23 1648)  lactated ringers bolus 1,000 mL (1,000 mLs Intravenous New Bag/Given 09/22/23 1917)       ED Triage Vitals  Encounter Vitals Group     BP 09/22/23 1503 (!) 208/172     Systolic BP  Percentile --      Diastolic BP Percentile --      Pulse Rate 09/22/23 1503 (!) 104     Resp 09/22/23 1503 16     Temp 09/22/23 1503 98.5 F (36.9 C)     Temp Source 09/22/23 1900 Oral     SpO2 09/22/23 1503 98 %     Weight 09/22/23 1502 160 lb (72.6 kg)     Height 09/22/23 1502 5\' 3"  (1.6 m)     Head Circumference --      Peak Flow --      Pain Score 09/22/23 1524 10     Pain Loc --      Pain Education --      Exclude from Growth Chart --   WUJW(11)@     _________________________________________ Significant initial  Findings: Abnormal Labs Reviewed  CBC WITH DIFFERENTIAL/PLATELET - Abnormal; Notable for the following components:      Result Value   WBC 12.1 (*)    Neutro Abs 10.0 (*)    All other components within normal limits  COMPREHENSIVE METABOLIC PANEL - Abnormal; Notable for the following components:   Potassium 3.2 (*)    Glucose, Bld 124 (*)    Total Protein 8.4 (*)    Total Bilirubin 1.3 (*)    All other components within normal limits  URINALYSIS, ROUTINE W REFLEX MICROSCOPIC - Abnormal; Notable for the following components:   Specific Gravity, Urine >1.046 (*)    Hgb urine dipstick MODERATE (*)    Ketones, ur 20 (*)    All other components within normal limits  RAPID URINE DRUG SCREEN, HOSP PERFORMED - Abnormal; Notable for the following components:   Tetrahydrocannabinol POSITIVE (*)    All other components within normal limits      ECG: Ordered    COVID-19 Labs  No results for input(s): "DDIMER", "FERRITIN", "LDH", "CRP" in the last 72 hours.  Lab Results  Component Value Date   SARSCOV2NAA NEGATIVE 09/22/2023   SARSCOV2NAA NEGATIVE 08/10/2023   SARSCOV2NAA NEGATIVE 01/16/2023   SARSCOV2NAA POSITIVE (A) 08/13/2022     The recent clinical data is shown below. Vitals:   09/22/23 1530 09/22/23 1807 09/22/23 1809 09/22/23 1900  BP: (!) 176/149 (!) 163/126 (!) 187/136   Pulse: (!) 104 90    Resp:  18    Temp:    98.3 F (36.8 C)  TempSrc:     Oral  SpO2: 98% 98%    Weight:      Height:        WBC     Component Value Date/Time   WBC 12.1 (H) 09/22/2023 1518   LYMPHSABS 1.2 09/22/2023 1518   MONOABS 0.7 09/22/2023 1518   EOSABS 0.1 09/22/2023 1518   BASOSABS 0.1 09/22/2023 1518          UA   no evidence of UTI     Urine analysis:  Component Value Date/Time   COLORURINE YELLOW 09/22/2023 1812   APPEARANCEUR CLEAR 09/22/2023 1812   LABSPEC >1.046 (H) 09/22/2023 1812   PHURINE 7.0 09/22/2023 1812   GLUCOSEU NEGATIVE 09/22/2023 1812   HGBUR MODERATE (A) 09/22/2023 1812   BILIRUBINUR NEGATIVE 09/22/2023 1812   KETONESUR 20 (A) 09/22/2023 1812   PROTEINUR NEGATIVE 09/22/2023 1812   UROBILINOGEN 0.2 01/18/2015 1324   NITRITE NEGATIVE 09/22/2023 1812   LEUKOCYTESUR NEGATIVE 09/22/2023 1812    Results for orders placed or performed during the hospital encounter of 09/22/23  Resp panel by RT-PCR (RSV, Flu A&B, Covid) Anterior Nasal Swab     Status: None   Collection Time: 09/22/23  3:48 PM   Specimen: Anterior Nasal Swab  Result Value Ref Range Status   SARS Coronavirus 2 by RT PCR NEGATIVE NEGATIVE Final         Influenza A by PCR NEGATIVE NEGATIVE Final   Influenza B by PCR NEGATIVE NEGATIVE Final         Resp Syncytial Virus by PCR NEGATIVE NEGATIVE Final             __________________________________________________________ Recent Labs  Lab 09/20/23 1323 09/22/23 1518  NA 134* 138  K 3.5 3.2*  CO2 21* 22  GLUCOSE 107* 124*  BUN 18 12  CREATININE 1.07* 0.86  CALCIUM 9.5 9.7    Cr  * stable,  Up from baseline see below Lab Results  Component Value Date   CREATININE 0.86 09/22/2023   CREATININE 1.07 (H) 09/20/2023   CREATININE 0.74 08/10/2023    Recent Labs  Lab 09/20/23 1323 09/22/23 1518  AST 24 21  ALT 15 14  ALKPHOS 79 70  BILITOT 1.1 1.3*  PROT 8.4* 8.4*  ALBUMIN 4.6 4.7   Lab Results  Component Value Date   CALCIUM 9.7 09/22/2023          Plt: Lab Results  Component  Value Date   PLT 301 09/22/2023    Recent Labs  Lab 09/20/23 1323 09/22/23 1518  WBC 12.3* 12.1*  NEUTROABS  --  10.0*  HGB 15.0 14.3  HCT 43.9 44.6  MCV 94.0 98.0  PLT 325 301    HG/HCT stable,       Component Value Date/Time   HGB 14.3 09/22/2023 1518   HCT 44.6 09/22/2023 1518   MCV 98.0 09/22/2023 1518     Recent Labs  Lab 09/20/23 1323 09/22/23 1518  LIPASE 60* 27    _______________________________________________ Hospitalist was called for admission for   Cannabinoid hyperemesis syndrome,  Hypokalemia  Dehydration      The following Work up has been ordered so far:  Orders Placed This Encounter  Procedures   Resp panel by RT-PCR (RSV, Flu A&B, Covid) Anterior Nasal Swab   CT ABDOMEN PELVIS W CONTRAST   CBC with Differential   Comprehensive metabolic panel   Lipase, blood   Urinalysis, Routine w reflex microscopic -Urine, Clean Catch   Rapid urine drug screen (hospital performed)   hCG, serum, qualitative   Cardiac Monitoring Continuous x 48 hours Indications for use: Other; Other indications for use: hypokalmeia   Consult to hospitalist   Insert peripheral IV   Place in observation (patient's expected length of stay will be less than 2 midnights)     OTHER Significant initial  Findings:  labs showing:     DM  labs:  HbA1C: No results for input(s): "HGBA1C" in the last 8760 hours.     CBG (last 3)  No results for input(s): "GLUCAP" in the last 72 hours.        Cultures:    Component Value Date/Time   SDES  11/03/2022 1409    CSF Performed at [facility], [address] [place], [place] [address]    [facility]  11/03/2022 1409    NONE Performed at [facility], [address] [place], [place] [address]    CULT  11/03/2022 1409    NO GROWTH 3 DAYS Performed at [facility], [address] [place], [place] [address]    REPTSTATUS 11/06/2022 FINAL 11/03/2022 1409     Radiological Exams on  Admission: CT ABDOMEN PELVIS W CONTRAST Result Date: 09/22/2023 CLINICAL DATA:  Right lower quadrant abdominal pain, nausea/vomiting/diarrhea EXAM: CT ABDOMEN AND PELVIS WITH CONTRAST TECHNIQUE: Multidetector CT imaging of the abdomen and pelvis was performed using the standard protocol following bolus administration of intravenous contrast. RADIATION DOSE REDUCTION: This exam was performed according to the departmental dose-optimization program which includes automated exposure control, adjustment of the mA and/or kV according to patient size and/or use of iterative reconstruction technique. CONTRAST:  OMNIPAQUE IOHEXOL 300 MG/ML  SOLN COMPARISON:  09/20/2023, 03/10/2018 FINDINGS: Lower chest: No acute pleural or parenchymal lung disease. Hepatobiliary: No focal liver abnormality is seen. Status post cholecystectomy. No biliary dilatation. Pancreas: Stable 9 mm cyst within the pancreatic tail. Otherwise unremarkable appearance with no pancreatic duct dilation or surrounding inflammatory change. Spleen: Normal in size without focal abnormality. Adrenals/Urinary Tract: Excreted contrast within the kidneys and bladder related to prior CT procedure. No acute renal findings. No urinary tract calculi or obstruction. The adrenals and bladder are unremarkable. Stomach/Bowel: No bowel obstruction or ileus. Normal gas-filled appendix right lower quadrant. No bowel wall thickening or inflammatory change. Vascular/Lymphatic: No significant vascular findings are present. No enlarged abdominal or pelvic lymph nodes. Reproductive: Status post hysterectomy. No adnexal masses. Other: No free fluid or free intraperitoneal gas. No abdominal wall hernia. Musculoskeletal: No acute or destructive bony abnormalities. Reconstructed images demonstrate stable chronic compression deformity superior endplate L1 vertebral body. IMPRESSION: 1. No acute intra-abdominal or intrapelvic process. Normal appendix. 2. Stable 9 mm cystic lesion  within the pancreatic tail. This has demonstrated no significant change since exams dating to 2019, and can be considered benign. No specific imaging follow-up recommended. Electronically Signed   By: Sharlet Salina M.D.   On: 09/22/2023 17:43   _______________________________________________________________________________________________________ Latest  Blood pressure (!) 187/136, pulse 90, temperature 98.3 F (36.8 C), temperature source Oral, resp. rate 18, height 5\' 3"  (1.6 m), weight 72.6 kg, last menstrual period 10/19/2019, SpO2 98%.   Vitals  labs and radiology finding personally reviewed  Review of Systems:    Pertinent positives include:   abdominal pain, nausea, vomiting, diarrhea  headaches, Constitutional:  No weight loss, night sweats, Fevers, chills, fatigue, weight loss  HEENT:  No Difficulty swallowing,Tooth/dental problems,Sore throat,  No sneezing, itching, ear ache, nasal congestion, post nasal drip,  Cardio-vascular:  No chest pain, Orthopnea, PND, anasarca, dizziness, palpitations.no Bilateral lower extremity swelling  GI:  No heartburn, indigestion,, change in bowel habits, loss of appetite, melena, blood in stool, hematemesis Resp:  no shortness of breath at rest. No dyspnea on exertion, No excess mucus, no productive cough, No non-productive cough, No coughing up of blood.No change in color of mucus.No wheezing. Skin:  no rash or lesions. No jaundice GU:  no dysuria, change in color of urine, no urgency or frequency. No straining to urinate.  No flank pain.  Musculoskeletal:  No joint pain or no joint swelling. No decreased range of motion. No back pain.  Psych:  No change in mood or affect. No depression or anxiety. No memory loss.  Neuro: no localizing neurological complaints, no tingling, no weakness, no double vision, no gait abnormality, no slurred speech, no confusion  All systems reviewed and apart from HOPI all are  negative _______________________________________________________________________________________________ Past Medical History:   Past Medical History:  Diagnosis Date   Cannabinoid hyperemesis syndrome    Crohn's disease (HCC)    Cyclical vomiting    Endometriosis    Fibroids    History of partial hysterectomy 10/24/2019   Hypertension    PCOS (polycystic ovarian syndrome)       Past Surgical History:  Procedure Laterality Date   ABDOMINAL HYSTERECTOMY     CESAREAN SECTION     CESAREAN SECTION     CHOLECYSTECTOMY     HERNIA REPAIR     TONSILLECTOMY     TUBAL LIGATION     TUBAL LIGATION      Social History:  Ambulatory   independently     reports that she has quit smoking. Her smoking use included cigarettes. She has never used smokeless tobacco. She reports current drug use. Drug: Marijuana. She reports that she does not drink alcohol.     Family History: Noncontributory _____________________________________________________________________ Allergies: Allergies  Allergen Reactions   Dicyclomine Itching   Morphine Itching    Take benadryl with morphine to help with itching    Oxycodone-Acetaminophen Hives, Shortness Of Breath and Itching    Abdominal Pain, too   Peanut-Containing Drug Products Hives and Itching   Zofran [Ondansetron Hcl] Hives and Shortness Of Breath   Ketorolac Other (See Comments)    Caused spasms of tongue and drooling & Dystonia   Other Itching    Fuzzy fruit   Percocet [Oxycodone-Acetaminophen] Hives and Itching   Tomato Hives and Itching   Zofran Hives and Itching   Haloperidol Rash     Prior to Admission medications   Medication Sig Start Date End Date Taking? Authorizing Provider  acetaminophen (TYLENOL) 500 MG tablet Take 500 mg by mouth every 6 (six) hours as needed for moderate pain or mild pain.   Yes [provider]  diphenhydrAMINE (BENADRYL) 25 MG tablet Take 25 mg by mouth every 6 (six) hours as needed for  allergies.   Yes [provider]  Multiple Vitamins-Minerals (CERTAVITE/ANTIOXIDANTS) TABS Take 1 tablet by mouth daily. 01/20/23  Yes Regalado, Belkys A, MD  topiramate (TOPAMAX) 25 MG tablet Take 1 tablet (25 mg total) by mouth at bedtime. May increase to 50mg  after 7 days Patient taking differently: Take 25 mg by mouth at bedtime. 11/03/22 11/03/23 Yes Willy Eddy, MD  acetaZOLAMIDE (DIAMOX) 250 MG tablet Take 2 tablets (500 mg total) by mouth at bedtime. Patient not taking: Reported on 09/22/2023 01/21/23   Regalado, Jon Billings A, MD  lidocaine (LIDODERM) 5 % Place 1 patch onto the skin daily. Remove & Discard patch within 12 hours or as directed by MD Patient not taking: Reported on 09/22/2023 08/10/23   Renne Crigler, PA-C  methocarbamol (ROBAXIN) 500 MG tablet Take 2 tablets (1,000 mg total) by mouth every 8 (eight) hours as needed for muscle spasms. Patient not taking: Reported on 09/22/2023 08/10/23   Renne Crigler, PA-C  promethazine (PHENERGAN) 12.5 MG tablet Take 1 tablet (12.5 mg total) by mouth every 8 (eight) hours as needed for up to 5 days  for nausea or vomiting. 01/20/23 01/25/23  Alba Cory, MD    ___________________________________________________________________________________________________ Physical Exam:    09/22/2023    6:09 PM 09/22/2023    6:07 PM 09/22/2023    3:30 PM  Vitals with BMI  Systolic 187 163 010  Diastolic 136 126 932  Pulse  90 104     1. General:  in Acute distress complaining of severe pain   well   -appearing 2. Psychological: Alert and   Oriented 3. Head/ENT:  Dry Mucous Membranes                          Head Non traumatic, neck supple                          Normal   Dentition 4. SKIN: normal  Skin turgor,  Skin clean Dry and intact no rash    5. Heart: Regular rate and rhythm no  Murmur, no Rub or gallop 6. Lungs:  no wheezes or crackles   7. Abdomen: Soft,epigastric -tender, Non distended  bowel sounds present 8. Lower  extremities: no clubbing, cyanosis, no  edema 9. Neurologically Grossly intact, moving all 4 extremities equally  10. MSK: Normal range of motion    Chart has been reviewed  ______________________________________________________________________________________________  Assessment/Plan  38 y.o. female with medical history significant of cannabinoid hyperemesis syndrome, Crohn's disease, endometriosis, hypertension sp cholecystectomy and hysterectomy.  Idiopathic intracranial hypertension   Admitted for   Cannabinoid hyperemesis syndrome  Hypokalemia, Dehydration     Present on Admission:  Intractable nausea and vomiting  Leukocytosis  Abdominal pain with vomiting  Accelerated hypertension  Hypokalemia  Dehydration  Idiopathic intracranial hypertension     Intractable nausea and vomiting Supportive management Phenergan Reglan Rehydration  Leukocytosis Likely reactive and in setting of hemoconcentration and dehydration  Abdominal pain with vomiting CT abdomen unremarkable nonacute.  Patient also endorses diarrhea will obtain gastric panel for completion  Accelerated hypertension In the setting of significant pain. Can attempt hydralazine as needed And pain management  Hypokalemia - will replace electrolytes and repeat  check Mg, phos and Ca level and replace as needed Monitor on telemetry   Lab Results  Component Value Date   K 3.2 (L) 09/22/2023     Lab Results  Component Value Date   CREATININE 0.86 09/22/2023   Lab Results  Component Value Date   MG 2.0 01/17/2023   Lab Results  Component Value Date   CALCIUM 9.7 09/22/2023     Dehydration Will rehydrate  Idiopathic intracranial hypertension Order CT head venogram and Restart topamax 50 mg po qhs    Other plan as per orders.  DVT prophylaxis:  SCD        Code Status:    Code Status: Prior FULL CODE  as per patient   I had personally discussed CODE STATUS with patient  ACP   none     Family Communication:   Family not at  Bedside    Diet clears   Disposition Plan:  To home once workup is complete and patient is stable   Following barriers for discharge:  Electrolytes corrected                               Anemia corrected h/H stable                             P        Consult Orders  (From admission, onward)           Start     Ordered   09/22/23 1853  Consult to hospitalist  Once       Provider:  (Not yet assigned)  Question Answer Comment  Place call to: [facility]   Reason for Consult Admit      09/22/23 1853                     Consults called: none discussed with NEurology if ctV abnormal will need official consult  Admission status:  ED Disposition     ED Disposition  Admit   Condition  --   Comment  Hospital Area: [facility] [[id number]]  Level of Care: Telemetry [5]  Admit to tele based on following criteria: Monitor QTC interval  May place patient in observation at [facility] or [facility] if equivalent level of care is available:: No  Covid Evaluation: Asymptomatic - no recent exposure (last 10 days) testing not required  Diagnosis: Intractable nausea and vomiting [720114]  Admitting Physician: Therisa Doyne [[id number]]  Attending Physician: Therisa Doyne [[id number]]          Obs      Level of care     tele  For  24H      Lab Results  Component Value Date   SARSCOV2NAA NEGATIVE 09/22/2023    Conita Amenta 09/22/2023, 8:53 PM    [facility]     after 2 AM please page floor coverage PA If 7AM-7PM, please contact the day team taking care of the patient using [email or web address]

## 2023-09-22 NOTE — Subjective & Objective (Signed)
 Presents with intractable nausea and vomiting in the setting of marijuana use reports also abd pain. Pt with hx of cannabinoid hyperemesis syndrome but also Crohn's disease and endometriosis,  No fever no contipation

## 2023-09-22 NOTE — Assessment & Plan Note (Signed)
 In the setting of significant pain. Can attempt hydralazine as needed And pain management

## 2023-09-22 NOTE — Assessment & Plan Note (Signed)
 CT abdomen unremarkable nonacute.  Patient also endorses diarrhea will obtain gastric panel for completion

## 2023-09-22 NOTE — ED Notes (Signed)
 Patient wants to hold off on tylenol at this time doesn't feel like she can lay down.

## 2023-09-22 NOTE — ED Provider Notes (Signed)
  EMERGENCY DEPARTMENT AT [facility] Provider Note   CSN: [id number] Arrival date & time: 09/22/23  1446     History  Chief Complaint  Patient presents with   Emesis    Sherry Key is a 38 y.o. female with history of cannabinoid hyperemesis syndrome, Crohn's disease, endometriosis, hypertension, presents with concern for abdominal pain, nausea and vomiting for the past 2 days.  Abdominal pain is present in the right lower abdomen.  She denies any constipation, diarrhea, fevers.  She does report previous abdominal surgeries of cholecystectomy and hysterectomy.   Emesis      Home Medications Prior to Admission medications   Medication Sig Start Date End Date Taking? Authorizing Provider  acetaminophen (TYLENOL) 500 MG tablet Take 500 mg by mouth every 6 (six) hours as needed for moderate pain or mild pain.   Yes [provider]  diphenhydrAMINE (BENADRYL) 25 MG tablet Take 25 mg by mouth every 6 (six) hours as needed for allergies.   Yes [provider]  Multiple Vitamins-Minerals (CERTAVITE/ANTIOXIDANTS) TABS Take 1 tablet by mouth daily. 01/20/23  Yes Regalado, Belkys A, MD  topiramate (TOPAMAX) 25 MG tablet Take 1 tablet (25 mg total) by mouth at bedtime. May increase to 50mg  after 7 days Patient taking differently: Take 25 mg by mouth at bedtime. 11/03/22 11/03/23 Yes Willy Eddy, MD  acetaZOLAMIDE (DIAMOX) 250 MG tablet Take 2 tablets (500 mg total) by mouth at bedtime. Patient not taking: Reported on 09/22/2023 01/21/23   Regalado, Jon Billings A, MD  lidocaine (LIDODERM) 5 % Place 1 patch onto the skin daily. Remove & Discard patch within 12 hours or as directed by MD Patient not taking: Reported on 09/22/2023 08/10/23   Renne Crigler, PA-C  methocarbamol (ROBAXIN) 500 MG tablet Take 2 tablets (1,000 mg total) by mouth every 8 (eight) hours as needed for muscle spasms. Patient not taking: Reported on 09/22/2023 08/10/23   Renne Crigler,  PA-C  promethazine (PHENERGAN) 12.5 MG tablet Take 1 tablet (12.5 mg total) by mouth every 8 (eight) hours as needed for up to 5 days for nausea or vomiting. 01/20/23 01/25/23  Regalado, Jon Billings A, MD      Allergies    Dicyclomine, Morphine, Oxycodone-acetaminophen, Peanut-containing drug products, Zofran [ondansetron hcl], Ketorolac, Other, Percocet [oxycodone-acetaminophen], Tomato, Zofran, and Haloperidol    Review of Systems   Review of Systems  Gastrointestinal:  Positive for vomiting.    Physical Exam Updated Vital Signs BP (!) 187/136   Pulse 90   Temp 98.3 F (36.8 C) (Oral)   Resp 18   Ht 5\' 3"  (1.6 m)   Wt 72.6 kg   LMP 10/19/2019   SpO2 98%   BMI 28.34 kg/m  Physical Exam Vitals and nursing note reviewed.  Constitutional:      General: She is not in acute distress.    Appearance: She is well-developed.     Comments: Patient with some emesis in her bag.  Hunched over in the bed.  Patient smells of cannabis  HENT:     Head: Normocephalic and atraumatic.  Eyes:     Conjunctiva/sclera: Conjunctivae normal.  Cardiovascular:     Rate and Rhythm: Normal rate and regular rhythm.     Heart sounds: No murmur heard. Pulmonary:     Effort: Pulmonary effort is normal. No respiratory distress.     Breath sounds: Normal breath sounds.  Abdominal:     Palpations: Abdomen is soft.     Tenderness: There is  no abdominal tenderness.     Comments: Abdominal tenderness to palpation  Musculoskeletal:        General: No swelling.     Cervical back: Neck supple.  Skin:    General: Skin is warm and dry.     Capillary Refill: Capillary refill takes less than 2 seconds.  Neurological:     Mental Status: She is alert.  Psychiatric:        Mood and Affect: Mood normal.     ED Results / Procedures / Treatments   Labs (all labs ordered are listed, but only abnormal results are displayed) Labs Reviewed  CBC WITH DIFFERENTIAL/PLATELET - Abnormal; Notable for the following  components:      Result Value   WBC 12.1 (*)    Neutro Abs 10.0 (*)    All other components within normal limits  COMPREHENSIVE METABOLIC PANEL - Abnormal; Notable for the following components:   Potassium 3.2 (*)    Glucose, Bld 124 (*)    Total Protein 8.4 (*)    Total Bilirubin 1.3 (*)    All other components within normal limits  URINALYSIS, ROUTINE W REFLEX MICROSCOPIC - Abnormal; Notable for the following components:   Specific Gravity, Urine >1.046 (*)    Hgb urine dipstick MODERATE (*)    Ketones, ur 20 (*)    All other components within normal limits  RAPID URINE DRUG SCREEN, HOSP PERFORMED - Abnormal; Notable for the following components:   Tetrahydrocannabinol POSITIVE (*)    All other components within normal limits  RESP PANEL BY RT-PCR (RSV, FLU A&B, COVID)  RVPGX2  LIPASE, BLOOD  HCG, SERUM, QUALITATIVE    EKG None  Radiology CT ABDOMEN PELVIS W CONTRAST Result Date: 09/22/2023 CLINICAL DATA:  Right lower quadrant abdominal pain, nausea/vomiting/diarrhea EXAM: CT ABDOMEN AND PELVIS WITH CONTRAST TECHNIQUE: Multidetector CT imaging of the abdomen and pelvis was performed using the standard protocol following bolus administration of intravenous contrast. RADIATION DOSE REDUCTION: This exam was performed according to the departmental dose-optimization program which includes automated exposure control, adjustment of the mA and/or kV according to patient size and/or use of iterative reconstruction technique. CONTRAST:  OMNIPAQUE IOHEXOL 300 MG/ML  SOLN COMPARISON:  09/20/2023, 03/10/2018 FINDINGS: Lower chest: No acute pleural or parenchymal lung disease. Hepatobiliary: No focal liver abnormality is seen. Status post cholecystectomy. No biliary dilatation. Pancreas: Stable 9 mm cyst within the pancreatic tail. Otherwise unremarkable appearance with no pancreatic duct dilation or surrounding inflammatory change. Spleen: Normal in size without focal abnormality.  Adrenals/Urinary Tract: Excreted contrast within the kidneys and bladder related to prior CT procedure. No acute renal findings. No urinary tract calculi or obstruction. The adrenals and bladder are unremarkable. Stomach/Bowel: No bowel obstruction or ileus. Normal gas-filled appendix right lower quadrant. No bowel wall thickening or inflammatory change. Vascular/Lymphatic: No significant vascular findings are present. No enlarged abdominal or pelvic lymph nodes. Reproductive: Status post hysterectomy. No adnexal masses. Other: No free fluid or free intraperitoneal gas. No abdominal wall hernia. Musculoskeletal: No acute or destructive bony abnormalities. Reconstructed images demonstrate stable chronic compression deformity superior endplate L1 vertebral body. IMPRESSION: 1. No acute intra-abdominal or intrapelvic process. Normal appendix. 2. Stable 9 mm cystic lesion within the pancreatic tail. This has demonstrated no significant change since exams dating to 2019, and can be considered benign. No specific imaging follow-up recommended. Electronically Signed   By: Sharlet Salina M.D.   On: 09/22/2023 17:43    Procedures Procedures    Medications Ordered  in ED Medications  promethazine (PHENERGAN) 12.5 mg in sodium chloride 0.9 % 50 mL IVPB (0 mg Intravenous Stopped 09/22/23 1640)  acetaminophen (TYLENOL) suppository 650 mg (650 mg Rectal Patient Refused/Not Given 09/22/23 1907)  potassium chloride 10 mEq in 100 mL IVPB (10 mEq Intravenous New Bag/Given 09/22/23 1917)  haloperidol lactate (HALDOL) injection 2 mg (2 mg Intravenous Patient Refused/Not Given 09/22/23 1918)  capsaicin (ZOSTRIX) 0.025 % cream ( Topical Given 09/22/23 1546)  fentaNYL (SUBLIMAZE) injection 50 mcg (50 mcg Intravenous Given 09/22/23 1524)  diphenhydrAMINE (BENADRYL) injection 25 mg (25 mg Intravenous Given 09/22/23 1523)  lactated ringers bolus 1,000 mL (1,000 mLs Intravenous New Bag/Given 09/22/23 1522)  fentaNYL (SUBLIMAZE)  injection 50 mcg (50 mcg Intravenous Given 09/22/23 1638)  potassium chloride 10 mEq in 100 mL IVPB (0 mEq Intravenous Stopped 09/22/23 1849)  iohexol (OMNIPAQUE) 300 MG/ML solution 100 mL (100 mLs Intravenous Contrast Given 09/22/23 1648)  lactated ringers bolus 1,000 mL (1,000 mLs Intravenous New Bag/Given 09/22/23 1917)    ED Course/ Medical Decision Making/ A&P Clinical Course as of 09/22/23 1937  Sat Sep 22, 2023  1851 Upon reevaluation, patient still having nausea and vomiting.  Suspect cannabinoid hyperemesis syndrome.  Will plan on admission for further management. [AF]    Clinical Course User Index [AF] Arabella Merles, PA-C                                 Medical Decision Making Amount and/or Complexity of Data Reviewed Labs: ordered. Radiology: ordered.  Risk OTC drugs. Prescription drug management.     Differential diagnosis includes but is not limited to cannabinol hyperemesis syndrome, cholelithiasis, cholangitis, choledocholithiasis, peptic ulcer, gastritis, gastroenteritis, appendicitis, IBS, IBD, DKA, nephrolithiasis, UTI, pyelonephritis, pancreatitis, diverticulitis, mesenteric ischemia, abdominal aortic aneurysm, small bowel obstruction, volvulus, testicular torsion in males, ovarian torsion and pregnancy related concerns in females of childbearing age    ED Course:  Upon initial evaluation, patient was slightly tachycardic to 104, elevated blood pressure of 176/149.  She appears uncomfortable, did have emesis in her bag at bedside.  I did not appreciate any abdominal tenderness on exam.  Initially given fentanyl, Benadryl, Compazine, and capsaicin cream for nausea and vomiting. I Ordered, and personally interpreted labs.  The pertinent results include:   CBC with leukocytosis of 12.1 CMP with hypokalemia at 3.2, mildly elevated T. bili at 1.3, otherwise unremarkable Lipase within normal limits Pregnancy negative RSV, COVID, flu negative UDS with high  specific gravity, but no nitrites or leukocytes 4: 47pm Upon re-evaluation, patient states she still has abdominal pain.  Requesting Dilaudid.  Explained to her I want to try some short acting medications for this to prevent rebound symptoms.  I added additional Tylenol suppository and fentanyl. Ordered 10 mEq Kcl for hypokalemia. Still awaiting CT.  6:55 PM, upon reevaluation, patient still having active emesis.  Still slightly tachycardic in the low 100's. She is requesting Dilaudid again. I did add haloperidol to see if this will help with her symptoms.  UDS positive for THC, suspect her nausea and vomiting secondary to cannabinoid hyperemesis syndrome.  Her CT abdomen pelvis is unremarkable.  Urinalysis with high specific gravity, will add on another LR bolus for dehydration and another 10 mEq Kcl IV for hypokalemia.  No signs of UTI.  No concern for acute intra-abdominal pathology given unremarkable CMP and CT. Nurse informed me that patient refuses haloperidol and Tylenol.   Suspect patient's symptoms  are secondary to cannabinoid hyperemesis syndrome.  She is still not tolerating p.o. intake, will plan on admission.  Patient is in agreement with being admitted.  Impression: Cannabinoid hyperemesis syndrome Dehydration Hypokalemia  Disposition:  Admission with Dr. Adela Glimpse  Imaging Studies ordered: I ordered imaging studies including CT abdomen pelvis I independently visualized the imaging with scope of interpretation limited to determining acute life threatening conditions related to emergency care. Imaging showed no acute intra-abdominal pathology.  Stable cystic lesion in the pancreatic tail, considered benign I agree with the radiologist interpretation   Cardiac Monitoring: / EKG: The patient was maintained on a cardiac monitor.  I personally viewed and interpreted the cardiac monitored which showed an underlying rhythm of: Sinus Tachycardia   Consultations Obtained: I requested  consultation with the hospitalist Dr. Adela Glimpse,  and discussed lab and imaging findings as well as pertinent plan - they recommend: admission for further management  External records from outside source obtained and reviewed including ER visit from 09/22/2023 where patient was also seen for cannabinoid hyperemesis syndrome and offered admission, but she declined at that time.              Final Clinical Impression(s) / ED Diagnoses Final diagnoses:  Cannabinoid hyperemesis syndrome  Hypokalemia  Dehydration    Rx / DC Orders ED Discharge Orders     None         Arabella Merles, Cordelia Poche 09/22/23 1937    Arby Barrette, MD 09/27/23 [phone]

## 2023-09-22 NOTE — ED Notes (Addendum)
 Patient BP elevated , patient states it is always like this when she feels sick like this.  Sherry Merles, PA notified on BP

## 2023-09-22 NOTE — ED Triage Notes (Signed)
 Patient bib gems N/v/d since yesterday Went to [facility] was told to come to ED Patient went home instead; symptoms worsened  Pain in right side of stomach Pain rated 10/10

## 2023-09-22 NOTE — Assessment & Plan Note (Addendum)
 Order CT head venogram and Restart topamax 50 mg po qhs

## 2023-09-22 NOTE — Assessment & Plan Note (Signed)
Will rehydrate 

## 2023-09-23 DIAGNOSIS — R197 Diarrhea, unspecified: Secondary | ICD-10-CM

## 2023-09-23 DIAGNOSIS — F12288 Cannabis dependence with other cannabis-induced disorder: Secondary | ICD-10-CM | POA: Diagnosis not present

## 2023-09-23 DIAGNOSIS — E860 Dehydration: Secondary | ICD-10-CM | POA: Diagnosis not present

## 2023-09-23 DIAGNOSIS — R1116 Cannabis hyperemesis syndrome: Secondary | ICD-10-CM

## 2023-09-23 DIAGNOSIS — I10 Essential (primary) hypertension: Secondary | ICD-10-CM | POA: Diagnosis not present

## 2023-09-23 DIAGNOSIS — R109 Unspecified abdominal pain: Secondary | ICD-10-CM | POA: Diagnosis not present

## 2023-09-23 LAB — COMPREHENSIVE METABOLIC PANEL
ALT: 11 U/L (ref 0–44)
AST: 17 U/L (ref 15–41)
Albumin: 3.8 g/dL (ref 3.5–5.0)
Alkaline Phosphatase: 58 U/L (ref 38–126)
Anion gap: 11 (ref 5–15)
BUN: 6 mg/dL (ref 6–20)
CO2: 21 mmol/L — ABNORMAL LOW (ref 22–32)
Calcium: 8.7 mg/dL — ABNORMAL LOW (ref 8.9–10.3)
Chloride: 103 mmol/L (ref 98–111)
Creatinine, Ser: 0.67 mg/dL (ref 0.44–1.00)
GFR, Estimated: >60 mL/min (ref >60)
Glucose, Bld: 91 mg/dL (ref 70–99)
Potassium: 3.2 mmol/L — ABNORMAL LOW (ref 3.5–5.1)
Sodium: 135 mmol/L (ref 135–145)
Total Bilirubin: 1.1 mg/dL (ref 0.0–1.2)
Total Protein: 6.9 g/dL (ref 6.5–8.1)

## 2023-09-23 LAB — CBC
HCT: 37.4 % (ref 36.0–46.0)
Hemoglobin: 12.5 g/dL (ref 12.0–15.0)
MCH: 32.4 pg (ref 26.0–34.0)
MCHC: 33.4 g/dL (ref 30.0–36.0)
MCV: 96.9 fL (ref 80.0–100.0)
Platelets: 254 10*3/uL (ref 150–400)
RBC: 3.86 MIL/uL — ABNORMAL LOW (ref 3.87–5.11)
RDW: 12.1 % (ref 11.5–15.5)
WBC: 12.2 10*3/uL — ABNORMAL HIGH (ref 4.0–10.5)
nRBC: 0 % (ref 0.0–0.2)

## 2023-09-23 LAB — MAGNESIUM: Magnesium: 1.8 mg/dL (ref 1.7–2.4)

## 2023-09-23 LAB — PHOSPHORUS: Phosphorus: 3.4 mg/dL (ref 2.5–4.6)

## 2023-09-23 LAB — CK: Total CK: 133 U/L (ref 38–234)

## 2023-09-23 LAB — C DIFFICILE QUICK SCREEN W PCR REFLEX
C Diff antigen: NEGATIVE
C Diff interpretation: NOT DETECTED
C Diff toxin: NEGATIVE

## 2023-09-23 MED ORDER — DIPHENHYDRAMINE HCL 50 MG/ML IJ SOLN
25.0000 mg | Freq: Once | INTRAMUSCULAR | Status: AC
  Administered 2023-09-23: 25 mg via INTRAVENOUS
  Filled 2023-09-23: qty 1

## 2023-09-23 MED ORDER — SUMATRIPTAN 20 MG/ACT NA SOLN
20.0000 mg | Freq: Once | NASAL | Status: AC
Start: 2023-09-23 — End: 2023-09-23
  Administered 2023-09-23: 20 mg via NASAL
  Filled 2023-09-23 (×2): qty 1

## 2023-09-23 MED ORDER — PROPRANOLOL HCL ER 60 MG PO CP24
60.0000 mg | ORAL_CAPSULE | Freq: Every day | ORAL | Status: DC
  Administered 2023-09-23: 60 mg via ORAL
  Filled 2023-09-23: qty 1

## 2023-09-23 MED ORDER — METOCLOPRAMIDE HCL 5 MG/ML IJ SOLN
5.0000 mg | Freq: Four times a day (QID) | INTRAMUSCULAR | Status: DC
  Administered 2023-09-23 (×3): 5 mg via INTRAVENOUS
  Filled 2023-09-23 (×3): qty 2

## 2023-09-23 MED ORDER — TOPIRAMATE 25 MG PO TABS
50.0000 mg | ORAL_TABLET | Freq: Two times a day (BID) | ORAL | Status: DC
  Administered 2023-09-23 (×2): 50 mg via ORAL
  Filled 2023-09-23 (×2): qty 2

## 2023-09-23 MED ORDER — ACETAMINOPHEN 500 MG PO TABS: 1000.0000 mg | ORAL_TABLET | Freq: Four times a day (QID) | ORAL | Status: DC | PRN

## 2023-09-23 MED ORDER — POTASSIUM PHOSPHATES 15 MMOLE/5ML IV SOLN
15.0000 mmol | Freq: Once | INTRAVENOUS | Status: AC
  Administered 2023-09-23: 15 mmol via INTRAVENOUS
  Filled 2023-09-23: qty 5

## 2023-09-23 MED ORDER — MELATONIN 5 MG PO TABS
5.0000 mg | ORAL_TABLET | Freq: Every evening | ORAL | Status: DC | PRN
Start: 2023-09-23 — End: 2023-09-24
  Administered 2023-09-23: 5 mg via ORAL
  Filled 2023-09-23: qty 1

## 2023-09-23 MED ORDER — POTASSIUM CHLORIDE CRYS ER 20 MEQ PO TBCR
40.0000 meq | EXTENDED_RELEASE_TABLET | ORAL | Status: AC
  Administered 2023-09-23 (×2): 40 meq via ORAL
  Filled 2023-09-23 (×2): qty 2

## 2023-09-23 NOTE — Assessment & Plan Note (Signed)
 09-23-2023 due to cannabis hyperemesis syndrome.  She has a long drawnout history of cyclic vomiting  that has been likely all this time due to cannabis hyperemesis.  She has seen GI providers before and [facility].  She was referred to [facility] but never went.

## 2023-09-23 NOTE — Subjective & Objective (Signed)
 Patient seen and examined.  I reviewed the patient's chart and Care Everywhere records over the last 20 years.  Patient has NEVER had a colonoscopy or upper endoscopy that demonstrated any inflammatory bowel disease.  She has never had a biopsy showing Crohn's or ulcerative colitis.  Furthermore, she has never had any endometrial or other surgical biopsies that show endometriosis.  In fact, her total abdominal hysterectomy pathology from April 2021 was negative for endometriosis.  Her self-reported history of Crohn's disease and endometriosis is completely false.  Review of the patient's chart shows that she frequently asks for IV Dilaudid by name.  Patient complains of a headache.  She is on Topamax twice daily.  Her last neurology appointment was in July 2024.  Her last neurology appointment was in July 2024.  She is supposed to be taking Diamox twice a day.  She is not doing this.  She states that she told her neurologist about this.  But there has been no mention of this at all anywhere in her chart.  Patient asking for IV Phenergan by name.  This request was denied.  IV Dilaudid has also been stopped.  No indication for IV opiates.  Review of the last 10 years of urine drug screen show that she has been consistently positive for cannabis on a urine drug screens.  There are 40 urine drug screens in the last 10 years.  They are ALL positive for cannabis.  Reviewed with the patient that she needs to stop using cannabis immediately.

## 2023-09-23 NOTE — Hospital Course (Signed)
 HPI: Sherry Key is a 38 y.o. female with medical history significant of cannabinoid hyperemesis syndrome, Crohn's disease, endometriosis, hypertension sp cholecystectomy and hysterectomy.  Idiopathic intracranial hypertension     Presented with  nausea vomiting abdominal pain Presents with intractable nausea and vomiting in the setting of marijuana use reports also abd pain. Pt with hx of cannabinoid hyperemesis syndrome but also Crohn's disease and endometriosis,  No fever no contipation Patient reports that she has been having significant diarrhea as well 1 question states that she could not tolerate her Topamax also endorses a headache and heavy sensation in her head Her blood pressure has been elevated but she states that this is typical for her when she is having severe pain Reports her symptoms best controlled with Phenergan and Dilaudid for IV  Significant Events: Admitted 09/22/2023 cannabis hyperemesis syndrome   Significant Labs: WBC 12.1, HgB 14.3, Plt 301 Na 138, K 3.2, CO2 of 22, BUN 12, Scr 0.86 Lipase 27 UDS positive for THC  Significant Imaging Studies: CT abd/pelvis No acute intra-abdominal or intrapelvic process. Normal appendix. 2. Stable 9 mm cystic lesion within the pancreatic tail. This has demonstrated no significant change since exams dating to 2019, and can be considered benign. No specific imaging follow-up recommended. CT head venogram Normal CT venogram of the brain. No evidence of dural venous sinus thrombosis. Normal appearance of the brain itself.  Antibiotic Therapy: Anti-infectives (From admission, onward)    None       Procedures:   Consultants:

## 2023-09-23 NOTE — Plan of Care (Signed)
  Problem: Education: Goal: Knowledge of General Education information will improve Description: Including pain rating scale, medication(s)/side effects and non-pharmacologic comfort measures Outcome: Progressing   Problem: Health Behavior/Discharge Planning: Goal: Ability to manage health-related needs will improve Outcome: Progressing   Problem: Activity: Goal: Risk for activity intolerance will decrease Outcome: Progressing   Problem: Elimination: Goal: Will not experience complications related to bowel motility Outcome: Progressing   Problem: Pain Managment: Goal: General experience of comfort will improve and/or be controlled Outcome: Progressing   Problem: Safety: Goal: Ability to remain free from injury will improve Outcome: Progressing   Problem: Skin Integrity: Goal: Risk for impaired skin integrity will decrease Outcome: Progressing

## 2023-09-23 NOTE — Assessment & Plan Note (Signed)
 09-23-2023 replete with po kcl

## 2023-09-23 NOTE — Assessment & Plan Note (Signed)
 09-23-2023 patient is supposed to be taking Diamox twice a day per her neurologist.  Patient been noncompliant not taking this medication.

## 2023-09-23 NOTE — Progress Notes (Signed)
 PROGRESS NOTE    CRIMSON DUBBERLY  EXB:[id number] DOB: June 28, 1986 DOA: 09/22/2023 PCP: Pcp, No  Subjective: Patient seen and examined.  I reviewed the patient's chart and Care Everywhere records over the last 20 years.  Patient has NEVER had a colonoscopy or upper endoscopy that demonstrated any inflammatory bowel disease.  She has never had a biopsy showing Crohn's or ulcerative colitis.  Furthermore, she has never had any endometrial or other surgical biopsies that show endometriosis.  In fact, her total abdominal hysterectomy pathology from April 2021 was negative for endometriosis.  Her self-reported history of Crohn's disease and endometriosis is completely false.  Review of the patient's chart shows that she frequently asks for IV Dilaudid by name.  Patient complains of a headache.  She is on Topamax twice daily.  Her last neurology appointment was in July 2024.  Her last neurology appointment was in July 2024.  She is supposed to be taking Diamox twice a day.  She is not doing this.  She states that she told her neurologist about this.  But there has been no mention of this at all anywhere in her chart.  Patient asking for IV Phenergan by name.  This request was denied.  IV Dilaudid has also been stopped.  No indication for IV opiates.  Review of the last 10 years of urine drug screen show that she has been consistently positive for cannabis on a urine drug screens.  There are 40 urine drug screens in the last 10 years.  They are ALL positive for cannabis.  Reviewed with the patient that she needs to stop using cannabis immediately.   Hospital Course: HPI: TASHAY BOZICH is a 38 y.o. female with medical history significant of cannabinoid hyperemesis syndrome, Crohn's disease, endometriosis, hypertension sp cholecystectomy and hysterectomy.  Idiopathic intracranial hypertension     Presented with  nausea vomiting abdominal pain Presents with intractable nausea and vomiting in  the setting of marijuana use reports also abd pain. Pt with hx of cannabinoid hyperemesis syndrome but also Crohn's disease and endometriosis,  No fever no contipation Patient reports that she has been having significant diarrhea as well 1 question states that she could not tolerate her Topamax also endorses a headache and heavy sensation in her head Her blood pressure has been elevated but she states that this is typical for her when she is having severe pain Reports her symptoms best controlled with Phenergan and Dilaudid for IV  Significant Events: Admitted 09/22/2023 cannabis hyperemesis syndrome   Significant Labs: WBC 12.1, HgB 14.3, Plt 301 Na 138, K 3.2, CO2 of 22, BUN 12, Scr 0.86 Lipase 27 UDS positive for THC  Significant Imaging Studies: CT abd/pelvis No acute intra-abdominal or intrapelvic process. Normal appendix. 2. Stable 9 mm cystic lesion within the pancreatic tail. This has demonstrated no significant change since exams dating to 2019, and can be considered benign. No specific imaging follow-up recommended. CT head venogram Normal CT venogram of the brain. No evidence of dural venous sinus thrombosis. Normal appearance of the brain itself.  Antibiotic Therapy: Anti-infectives (From admission, onward)    None       Procedures:   Consultants:     Assessment and Plan: * Cannabis hyperemesis syndrome concurrent with and due to cannabis dependence (HCC) 09-23-2023 patient clearly has cannabis hyperemesis syndrome.  This is been well-documented due to her repeated bouts of cyclic vomiting.  Her cannabis abuse dates back nearly 20 years.  Do not prescribe any form of  opiates for this patient.  Especially not IV or p.o. Dilaudid.  Continue with scheduled Reglan as this will likely help her migraine headaches as well.  Do not prescribe IV Phenergan.  Patient placed on a full liquid diet.  Diarrhea 09-23-2023 patient reports that she has been having diarrhea.  She  states she has had a episode of diarrhea today.  Tried to collect stool samples.  Patient informed she needs to let the nurse tech know to collect the stool sample after she has had a bowel movement.  Dehydration 09-23-2023 no dehydration now based on BUN or Scr  Hypokalemia 09-23-2023 replete with po kcl  Accelerated hypertension 09-23-2023 continue with as needed hydralazine.  She is supposed to be taking propranolol LA 60 mg for her history of hypertension.  She clearly has not been taking this medication for at least for 5 years.  Will restart at 60 mg a day.  This may help her migraine headaches as well.  Intractable nausea and vomiting 09-23-2023 due to cannabis hyperemesis syndrome.  She has a long drawnout history of cyclic vomiting  that has been likely all this time due to cannabis hyperemesis.  She has seen GI providers before and [facility].  She was referred to [facility] but never went.  Leukocytosis 09-23-2023 due to cannabis hyperemesis syndrome.  She has a long drawnout history of cyclic vomiting  that has been likely all this time due to cannabis hyperemesis.  She has seen GI providers before and [facility].  She was referred to [facility] but never went.  Abdominal pain with vomiting 09-23-2023 due to cannabis hyperemesis syndrome.  She has a long drawnout history of cyclic vomiting  that has been likely all this time due to cannabis hyperemesis.  She has seen GI providers before and [facility].  She was referred to [facility] but never went.  Idiopathic intracranial hypertension 09-23-2023 patient is supposed to be taking Diamox twice a day per her neurologist.  Patient been noncompliant not taking this medication.   DVT prophylaxis: SCDs Start: 09/22/23 2216    Code Status: Full Code Family Communication: no family at bedside. She is decisional. Disposition Plan: return home Reason for continuing need for hospitalization: monitor for diarrhea. Replete K.  Objective: Vitals:    09/22/23 2222 09/23/23 0227 09/23/23 0638 09/23/23 1434  BP: (!) 177/115 (!) 143/93 138/87 (!) 133/93  Pulse: 88 87 95 90  Resp: 18 17 18 15   Temp: 98.1 F (36.7 C) 98.3 F (36.8 C) 98.6 F (37 C) 98.5 F (36.9 C)  TempSrc:  Oral Oral Oral  SpO2: 98% 100% 100% 98%  Weight:      Height:        Intake/Output Summary (Last 24 hours) at 09/23/2023 1519 Last data filed at 09/23/2023 1122 Gross per 24 hour  Intake 2485.67 ml  Output --  Net 2485.67 ml   Filed Weights   09/22/23 1502  Weight: 72.6 kg    Examination:  Physical Exam Vitals and nursing note reviewed.  Constitutional:      Appearance: Normal appearance.  HENT:     Head: Normocephalic and atraumatic.     Nose: Nose normal.  Eyes:     General: No scleral icterus. Cardiovascular:     Rate and Rhythm: Normal rate and regular rhythm.  Pulmonary:     Effort: Pulmonary effort is normal.     Breath sounds: Normal breath sounds.  Abdominal:     General: Abdomen is flat. Bowel sounds  are normal. There is no distension.     Palpations: Abdomen is soft.  Musculoskeletal:     Right lower leg: No edema.     Left lower leg: No edema.  Skin:    General: Skin is warm and dry.     Capillary Refill: Capillary refill takes less than 2 seconds.  Neurological:     General: No focal deficit present.     Mental Status: She is alert and oriented to person, place, and time.     Data Reviewed: I have personally reviewed following labs and imaging studies  CBC: Recent Labs  Lab 09/20/23 1323 09/22/23 1518 09/23/23 0553  WBC 12.3* 12.1* 12.2*  NEUTROABS  --  10.0*  --   HGB 15.0 14.3 12.5  HCT 43.9 44.6 37.4  MCV 94.0 98.0 96.9  PLT 325 301 254   Basic Metabolic Panel: Recent Labs  Lab 09/20/23 1323 09/22/23 1518 09/22/23 2241 09/23/23 0553  NA 134* 138  --  135  K 3.5 3.2*  --  3.2*  CL 103 104  --  103  CO2 21* 22  --  21*  GLUCOSE 107* 124*  --  91  BUN 18 12  --  6  CREATININE 1.07* 0.86  --  0.67   CALCIUM 9.5 9.7  --  8.7*  MG  --   --  1.8 1.8  PHOS  --   --  1.6* 3.4   GFR: Estimated Creatinine Clearance: 91.1 mL/min (by C-G formula based on SCr of 0.67 mg/dL). Liver Function Tests: Recent Labs  Lab 09/20/23 1323 09/22/23 1518 09/23/23 0553  AST 24 21 17   ALT 15 14 11   ALKPHOS 79 70 58  BILITOT 1.1 1.3* 1.1  PROT 8.4* 8.4* 6.9  ALBUMIN 4.6 4.7 3.8   Recent Labs  Lab 09/20/23 1323 09/22/23 1518  LIPASE 60* 27   Cardiac Enzymes: Recent Labs  Lab 09/22/23 2241 09/23/23 0553  CKTOTAL 150 133   Thyroid Function Tests: Recent Labs    09/22/23 2241  TSH 0.561   Sepsis Labs: Recent Labs  Lab 09/22/23 2242  LATICACIDVEN 1.1    Recent Results (from the past 240 hours)  C Difficile Quick Screen w PCR reflex     Status: None   Collection Time: 09/22/23  2:08 PM   Specimen: Rectum; Stool  Result Value Ref Range Status   C Diff antigen NEGATIVE NEGATIVE Final   C Diff toxin NEGATIVE NEGATIVE Final   C Diff interpretation No C. difficile detected.  Final    Comment: Performed at [facility], [address] [place], [place] [address]  Resp panel by RT-PCR (RSV, Flu A&B, Covid) Anterior Nasal Swab     Status: None   Collection Time: 09/22/23  3:48 PM   Specimen: Anterior Nasal Swab  Result Value Ref Range Status   SARS Coronavirus 2 by RT PCR NEGATIVE NEGATIVE Final    Comment: (NOTE) SARS-CoV-2 target nucleic acids are NOT DETECTED.  The SARS-CoV-2 RNA is generally detectable in upper respiratory specimens during the acute phase of infection. The lowest concentration of SARS-CoV-2 viral copies this assay can detect is 138 copies/mL. A negative result does not preclude SARS-Cov-2 infection and should not be used as the sole basis for treatment or other patient management decisions. A negative result may occur with  improper specimen collection/handling, submission of specimen other than nasopharyngeal swab, presence of viral  mutation(s) within the areas targeted by this assay, and inadequate  number of viral copies(<138 copies/mL). A negative result must be combined with clinical observations, patient history, and epidemiological information. The expected result is Negative.  Fact Sheet for Patients:  BloggerCourse.com  Fact Sheet for Healthcare Providers:  SeriousBroker.it  This test is no t yet approved or cleared by the [place] FDA and  has been authorized for detection and/or diagnosis of SARS-CoV-2 by FDA under an Emergency Use Authorization (EUA). This EUA will remain  in effect (meaning this test can be used) for the duration of the COVID-19 declaration under Section 564(b)(1) of the Act, 21 U.S.C.section 360bbb-3(b)(1), unless the authorization is terminated  or revoked sooner.       Influenza A by PCR NEGATIVE NEGATIVE Final   Influenza B by PCR NEGATIVE NEGATIVE Final    Comment: (NOTE) The Xpert Xpress SARS-CoV-2/FLU/RSV plus assay is intended as an aid in the diagnosis of influenza from Nasopharyngeal swab specimens and should not be used as a sole basis for treatment. Nasal washings and aspirates are unacceptable for Xpert Xpress SARS-CoV-2/FLU/RSV testing.  Fact Sheet for Patients: BloggerCourse.com  Fact Sheet for Healthcare Providers: SeriousBroker.it  This test is not yet approved or cleared by the [place] FDA and has been authorized for detection and/or diagnosis of SARS-CoV-2 by FDA under an Emergency Use Authorization (EUA). This EUA will remain in effect (meaning this test can be used) for the duration of the COVID-19 declaration under Section 564(b)(1) of the Act, 21 U.S.C. section 360bbb-3(b)(1), unless the authorization is terminated or revoked.     Resp Syncytial Virus by PCR NEGATIVE NEGATIVE Final    Comment: (NOTE) Fact Sheet for  Patients: BloggerCourse.com  Fact Sheet for Healthcare Providers: SeriousBroker.it  This test is not yet approved or cleared by the [place] FDA and has been authorized for detection and/or diagnosis of SARS-CoV-2 by FDA under an Emergency Use Authorization (EUA). This EUA will remain in effect (meaning this test can be used) for the duration of the COVID-19 declaration under Section 564(b)(1) of the Act, 21 U.S.C. section 360bbb-3(b)(1), unless the authorization is terminated or revoked.  Performed at [facility], [address] [place], [place] [address]      Radiology Studies: CT VENOGRAM HEAD Result Date: 09/22/2023 CLINICAL DATA:  Sudden severe headache.  Nausea since yesterday. EXAM: CT VENOGRAM HEAD TECHNIQUE: Venographic phase images of the brain were obtained following the administration of intravenous contrast. Multiplanar reformats and maximum intensity projections were generated. RADIATION DOSE REDUCTION: This exam was performed according to the departmental dose-optimization program which includes automated exposure control, adjustment of the mA and/or kV according to patient size and/or use of iterative reconstruction technique. CONTRAST:  80mL OMNIPAQUE IOHEXOL 300 MG/ML  SOLN COMPARISON:  CT studies 08/10/2023 FINDINGS: Normal appearance of the brain without evidence of malformation, atrophy, old or acute infarction, mass lesion, hemorrhage, hydrocephalus or extra-axial collection. Sinuses are clear. No calvarial abnormality. Superior sagittal sinus is patent and normal. Both transverse sinuses are patent, with the right being dominant in the left being quite diminutive. Both sigmoid sinuses and jugular veins are patent, again the right larger than the left. No evidence of deep system thrombosis. IMPRESSION: Normal CT venogram of the brain. No evidence of dural venous sinus thrombosis. Normal appearance of  the brain itself. Electronically Signed   By: Paulina Fusi M.D.   On: 09/22/2023 21:22   CT ABDOMEN PELVIS W CONTRAST Result Date: 09/22/2023 CLINICAL DATA:  Right lower quadrant abdominal pain, nausea/vomiting/diarrhea EXAM: CT ABDOMEN  AND PELVIS WITH CONTRAST TECHNIQUE: Multidetector CT imaging of the abdomen and pelvis was performed using the standard protocol following bolus administration of intravenous contrast. RADIATION DOSE REDUCTION: This exam was performed according to the departmental dose-optimization program which includes automated exposure control, adjustment of the mA and/or kV according to patient size and/or use of iterative reconstruction technique. CONTRAST:  OMNIPAQUE IOHEXOL 300 MG/ML  SOLN COMPARISON:  09/20/2023, 03/10/2018 FINDINGS: Lower chest: No acute pleural or parenchymal lung disease. Hepatobiliary: No focal liver abnormality is seen. Status post cholecystectomy. No biliary dilatation. Pancreas: Stable 9 mm cyst within the pancreatic tail. Otherwise unremarkable appearance with no pancreatic duct dilation or surrounding inflammatory change. Spleen: Normal in size without focal abnormality. Adrenals/Urinary Tract: Excreted contrast within the kidneys and bladder related to prior CT procedure. No acute renal findings. No urinary tract calculi or obstruction. The adrenals and bladder are unremarkable. Stomach/Bowel: No bowel obstruction or ileus. Normal gas-filled appendix right lower quadrant. No bowel wall thickening or inflammatory change. Vascular/Lymphatic: No significant vascular findings are present. No enlarged abdominal or pelvic lymph nodes. Reproductive: Status post hysterectomy. No adnexal masses. Other: No free fluid or free intraperitoneal gas. No abdominal wall hernia. Musculoskeletal: No acute or destructive bony abnormalities. Reconstructed images demonstrate stable chronic compression deformity superior endplate L1 vertebral body. IMPRESSION: 1. No acute  intra-abdominal or intrapelvic process. Normal appendix. 2. Stable 9 mm cystic lesion within the pancreatic tail. This has demonstrated no significant change since exams dating to 2019, and can be considered benign. No specific imaging follow-up recommended. Electronically Signed   By: Sharlet Salina M.D.   On: 09/22/2023 17:43    Scheduled Meds:  acetaminophen  650 mg Rectal Once   haloperidol lactate  2 mg Intravenous Once   metoCLOPramide (REGLAN) injection  5 mg Intravenous Q6H   potassium chloride  40 mEq Oral Q4H   propranolol ER  60 mg Oral Daily   topiramate  50 mg Oral BID   Continuous Infusions:   LOS: 0 days   Time spent: 45 minutes  Carollee Herter, DO  [facility]  09/23/2023, 3:19 PM

## 2023-09-23 NOTE — Assessment & Plan Note (Signed)
 09-23-2023 patient reports that she has been having diarrhea.  She states she has had a episode of diarrhea today.  Tried to collect stool samples.  Patient informed she needs to let the nurse tech know to collect the stool sample after she has had a bowel movement.

## 2023-09-23 NOTE — Assessment & Plan Note (Signed)
 09-23-2023 no dehydration now based on BUN or Scr

## 2023-09-23 NOTE — Assessment & Plan Note (Signed)
 09-23-2023 continue with as needed hydralazine.  She is supposed to be taking propranolol LA 60 mg for her history of hypertension.  She clearly has not been taking this medication for at least for 5 years.  Will restart at 60 mg a day.  This may help her migraine headaches as well.

## 2023-09-23 NOTE — Assessment & Plan Note (Addendum)
 09-23-2023 patient clearly has cannabis hyperemesis syndrome.  This is been well-documented due to her repeated bouts of cyclic vomiting.  Her cannabis abuse dates back nearly 20 years.  Do not prescribe any form of opiates for this patient.  Especially not IV or p.o. Dilaudid.  Continue with scheduled Reglan as this will likely help her migraine headaches as well.  Do not prescribe IV Phenergan.  Patient placed on a full liquid diet.

## 2023-09-24 ENCOUNTER — Encounter: Payer: Self-pay | Admitting: Emergency Medicine

## 2023-09-24 ENCOUNTER — Encounter: Payer: Self-pay | Admitting: Internal Medicine

## 2023-09-24 ENCOUNTER — Other Ambulatory Visit: Payer: Self-pay

## 2023-09-24 ENCOUNTER — Emergency Department
Admission: EM | Admit: 2023-09-24 | Discharge: 2023-09-24 | Disposition: A | Discharge status: Home / Self Care | Payer: MEDICAID | Attending: Emergency Medicine | Admitting: Emergency Medicine

## 2023-09-24 DIAGNOSIS — Z765 Malingerer [conscious simulation]: Secondary | ICD-10-CM | POA: Insufficient documentation

## 2023-09-24 DIAGNOSIS — F1290 Cannabis use, unspecified, uncomplicated: Secondary | ICD-10-CM

## 2023-09-24 DIAGNOSIS — R112 Nausea with vomiting, unspecified: Secondary | ICD-10-CM | POA: Diagnosis present

## 2023-09-24 DIAGNOSIS — F12988 Cannabis use, unspecified with other cannabis-induced disorder: Secondary | ICD-10-CM | POA: Insufficient documentation

## 2023-09-24 HISTORY — DX: Malingerer (conscious simulation): Z76.5

## 2023-09-24 LAB — GASTROINTESTINAL PANEL BY PCR, STOOL (REPLACES STOOL CULTURE)

## 2023-09-24 MED ORDER — DIPHENHYDRAMINE HCL 25 MG PO CAPS: 25.0000 mg | ORAL_CAPSULE | Freq: Once | ORAL | Status: DC | PRN

## 2023-09-24 MED ORDER — DIPHENHYDRAMINE HCL 50 MG/ML IJ SOLN
25.0000 mg | Freq: Once | INTRAMUSCULAR | Status: AC
  Administered 2023-09-24: 25 mg via INTRAVENOUS
  Filled 2023-09-24: qty 1

## 2023-09-24 MED ORDER — LORAZEPAM 2 MG/ML IJ SOLN
1.0000 mg | Freq: Once | INTRAMUSCULAR | Status: AC
  Administered 2023-09-24: 1 mg via INTRAVENOUS
  Filled 2023-09-24: qty 1

## 2023-09-24 MED ORDER — PROMETHAZINE HCL 25 MG/ML IJ SOLN
25.0000 mg | Freq: Once | INTRAMUSCULAR | Status: AC
  Administered 2023-09-24: 25 mg via INTRAMUSCULAR
  Filled 2023-09-24: qty 1

## 2023-09-24 MED ORDER — DROPERIDOL 2.5 MG/ML IJ SOLN
2.5000 mg | Freq: Once | INTRAMUSCULAR | Status: AC
  Administered 2023-09-24: 2.5 mg via INTRAVENOUS
  Filled 2023-09-24: qty 2

## 2023-09-24 MED ORDER — SODIUM CHLORIDE 0.9 % IV BOLUS
1000.0000 mL | Freq: Once | INTRAVENOUS | Status: AC
  Administered 2023-09-24: 1000 mL via INTRAVENOUS

## 2023-09-24 NOTE — ED Triage Notes (Signed)
 Pt to ED via POV for N/V. Pt still is continuing to vomit. Pt endorses recent admission for same. No N/V noted at time of arrival.

## 2023-09-24 NOTE — Discharge Instructions (Addendum)
 Stop using marijuana products for at least 1 year before you will see improvement in your cyclic vomiting syndrome, if it is indeed related to marijuana use.  Thank you for choosing [place] for your health care today!  Please see your primary doctor this week for a follow up appointment.   If you have any new, worsening, or unexpected symptoms call your doctor right away or come back to the emergency department for reevaluation.  It was my pleasure to care for you today.   Daneil Dan Modesto Charon, MD

## 2023-09-24 NOTE — Progress Notes (Signed)
                                                  Against Medical Advice Patient at this time expresses desire to leave the Hospital immediately, patient has been warned that this is not Medically advisable at this time, and can result in Medical complications like Death and Disability, patient understands and accepts the risks involved and assumes full responsibilty of this decision.  This patient has also been advised that if they feel the need for further medical assistance to return to any available ER or dial [phone].  Informed by Nursing staff that this patient has left care and has signed the form  Against Medical Advice on 09/24/2023 at 0115 hrs.  Chinita Greenland BSN MSNA MSN ACNPC-AG Acute Care Nurse Practitioner [facility]

## 2023-09-24 NOTE — ED Provider Notes (Addendum)
 [facility] Provider Note    Event Date/Time   First MD Initiated Contact with Patient 09/24/23 [phone]     (approximate)   History   Emesis   HPI  Sherry Key is a 38 y.o. female   Past medical history of cyclic vomiting syndrome, cannabinoid hyperemesis syndrome, here with nausea and vomiting and abdominal pain.  Note from outside hospital reviewed and she had just left AGAINST MEDICAL ADVICE about 1 hour ago.  She is here with symptoms as above.   She is asking for ongoing treatment as she has ongoing nausea.  She states that she has been tolerating Jell-O and liquids.   External Medical Documents Reviewed: Hospital notes from today and yesterday for recent hospitalization for cannabinol hyperemesis syndrome.  She had a CT abdomen pelvis that showed no emergency findings.  Physician note at that time as follows --  " I reviewed the patient's chart and Care Everywhere records over the last 20 years.  Patient has NEVER had a colonoscopy or upper endoscopy that demonstrated any inflammatory bowel disease.  She has never had a biopsy showing Crohn's or ulcerative colitis.  Furthermore, she has never had any endometrial or other surgical biopsies that show endometriosis.  In fact, her total abdominal hysterectomy pathology from April 2021 was negative for endometriosis.   Her self-reported history of Crohn's disease and endometriosis is completely false.  ...  Cannabis hyperemesis syndrome concurrent with and due to cannabis dependence (HCC) 09-23-2023 patient clearly has cannabis hyperemesis syndrome.  This is been well-documented due to her repeated bouts of cyclic vomiting.  Her cannabis abuse dates back nearly 20 years.  Do not prescribe any form of opiates for this patient.  Especially not IV or p.o. Dilaudid.  Continue with scheduled Reglan as this will likely help her migraine headaches as well.  Do not prescribe IV Phenergan.  Patient placed on a  full liquid diet. "     Physical Exam   Triage Vital Signs: ED Triage Vitals [09/24/23 0224]  Encounter Vitals Group     BP      Systolic BP Percentile      Diastolic BP Percentile      Pulse      Resp      Temp      Temp src      SpO2      Weight 160 lb (72.6 kg)     Height 5\' 3"  (1.6 m)     Head Circumference      Peak Flow      Pain Score 10     Pain Loc      Pain Education      Exclude from Growth Chart     Most recent vital signs: Vitals:   09/24/23 0241  BP: (!) 138/97  Pulse: 82  Resp: 17  Temp: 98.4 F (36.9 C)  SpO2: 100%    General: Awake, no distress.  CV:  Good peripheral perfusion.  Resp:  Normal effort.  Abd:  No distention.  Other:  After well-appearing with a soft benign abdominal exam without tenderness rigidity or guarding, appears euvolemic overall with normal vital signs.   ED Results / Procedures / Treatments   Labs (all labs ordered are listed, but only abnormal results are displayed) Labs Reviewed - No data to display    I ordered and reviewed the above labs they are notable for labs obtained during hospital stay from which she just left AMA  approximately 1 hour ago reviewed, unremarkable cell counts electrolytes, negative pregnancy, normal-appearing urinalysis.  Thus will defer further lab and urinalysis testing now.    PROCEDURES:  Critical Care performed: No  Procedures   MEDICATIONS ORDERED IN ED: Medications  droperidol (INAPSINE) 2.5 MG/ML injection 2.5 mg (2.5 mg Intravenous Given 09/24/23 0315)  promethazine (PHENERGAN) injection 25 mg (25 mg Intramuscular Given 09/24/23 0316)  sodium chloride 0.9 % bolus 1,000 mL (0 mLs Intravenous Stopped 09/24/23 0442)  diphenhydrAMINE (BENADRYL) injection 25 mg (25 mg Intravenous Given 09/24/23 0334)  diphenhydrAMINE (BENADRYL) injection 25 mg (25 mg Intravenous Given 09/24/23 0442)  LORazepam (ATIVAN) injection 1 mg (1 mg Intravenous Given 09/24/23 0442)    IMPRESSION / MDM /  ASSESSMENT AND PLAN / ED COURSE  I reviewed the triage vital signs and the nursing notes.                                Patient's presentation is most consistent with acute presentation with potential threat to life or bodily function.  Differential diagnosis includes, but is not limited to, cannabinoid hyperemesis syndrome, dehydration or metabolic derangements, considered but less likely intra-abdominal infection   The patient is on the cardiac monitor to evaluate for evidence of arrhythmia and/or significant heart rate changes.  MDM:    Patient with nausea abdominal discomfort with known cannabinoid hyperemesis syndrome who just left AMA from other hospital 1 hour ago and came here for further treatment.  I am not sure why she left the other hospital though she tells her nurse " she was not being listened to". She had an extensive workup at that time including lab testing, pregnancy testing, urinalysis and a CT of the abdomen and pelvis which were largely unremarkable.  She states that she has been tolerating light p.o. intake.  She has a soft benign abdominal exam and would defer further advanced imaging given recent imaging and benign abdominal exam I doubt surgical abdominal pathologies at this time.  Not sure exactly what she wants here.  I will give her a dose of antiemetic, and fluids, and p.o. challenge and if she is tolerating that she can be discharged with outpatient follow-up.  I told her no narcotics, she is amenable to this plan.  She understands to follow-up with her GI doctor and PMD.  I also instructed her to discontinue her use of cannabis products for prolonged duration as these are likely contributing to her recurrent cyclic vomiting.  --- She gave [place] a stool sample.  She wants the stool sample to be sent for testing as she is concerned for C. difficile... Check of labs shows a negative C. difficile test obtained already on 09/22/2023 so will not resend.   -- With the  droperidol she developed " tongue spasming" and is now pacing the room sticking her tongue out.  She has no respiratory distress, no vomiting, and hemodynamics remain reassuring.  She has no rash.  She has no swelling to the oropharynx and I am able to see directly to the posterior oropharynx.  This may be some sort of dystonic reaction or allergic reaction or perhaps volitional.  Will give IV Benadryl and continue to monitor.  Resolved with Benadryl.  Sleeping comfortably talking normal tongue is normal.  P.o. challenge and discharge.     FINAL CLINICAL IMPRESSION(S) / ED DIAGNOSES   Final diagnoses:  Cannabinoid hyperemesis syndrome     Rx /  DC Orders   ED Discharge Orders     None        Note:  This document was prepared using Dragon voice recognition software and may include unintentional dictation errors.    Pilar Jarvis, MD 09/24/23 4782    Pilar Jarvis, MD 09/24/23 [phone]

## 2023-09-24 NOTE — Progress Notes (Signed)
 Pt signed out ama at 0115  On call/charge Rn  notified

## 2023-11-18 ENCOUNTER — Encounter (HOSPITAL_BASED_OUTPATIENT_CLINIC_OR_DEPARTMENT_OTHER): Payer: Self-pay | Admitting: Emergency Medicine

## 2023-11-18 ENCOUNTER — Inpatient Hospital Stay (HOSPITAL_BASED_OUTPATIENT_CLINIC_OR_DEPARTMENT_OTHER)
Admission: EM | Admit: 2023-11-18 | Discharge: 2023-11-20 | DRG: 682 | Disposition: A | Discharge status: Home / Self Care | Payer: MEDICAID | Attending: Family Medicine | Admitting: Family Medicine

## 2023-11-18 ENCOUNTER — Emergency Department (HOSPITAL_BASED_OUTPATIENT_CLINIC_OR_DEPARTMENT_OTHER): Payer: MEDICAID

## 2023-11-18 ENCOUNTER — Other Ambulatory Visit: Payer: Self-pay

## 2023-11-18 DIAGNOSIS — R1116 Cannabis hyperemesis syndrome: Secondary | ICD-10-CM | POA: Diagnosis present

## 2023-11-18 DIAGNOSIS — K5090 Crohn's disease, unspecified, without complications: Secondary | ICD-10-CM | POA: Diagnosis present

## 2023-11-18 DIAGNOSIS — F12288 Cannabis dependence with other cannabis-induced disorder: Secondary | ICD-10-CM | POA: Diagnosis present

## 2023-11-18 DIAGNOSIS — Z885 Allergy status to narcotic agent status: Secondary | ICD-10-CM

## 2023-11-18 DIAGNOSIS — W228XXA Striking against or struck by other objects, initial encounter: Secondary | ICD-10-CM | POA: Diagnosis present

## 2023-11-18 DIAGNOSIS — R000 Tachycardia, unspecified: Secondary | ICD-10-CM | POA: Diagnosis present

## 2023-11-18 DIAGNOSIS — F17210 Nicotine dependence, cigarettes, uncomplicated: Secondary | ICD-10-CM | POA: Diagnosis present

## 2023-11-18 DIAGNOSIS — R109 Unspecified abdominal pain: Secondary | ICD-10-CM | POA: Diagnosis present

## 2023-11-18 DIAGNOSIS — R112 Nausea with vomiting, unspecified: Secondary | ICD-10-CM | POA: Diagnosis present

## 2023-11-18 DIAGNOSIS — Z79899 Other long term (current) drug therapy: Secondary | ICD-10-CM

## 2023-11-18 DIAGNOSIS — N179 Acute kidney failure, unspecified: Principal | ICD-10-CM | POA: Diagnosis present

## 2023-11-18 DIAGNOSIS — R1115 Cyclical vomiting syndrome unrelated to migraine: Secondary | ICD-10-CM

## 2023-11-18 DIAGNOSIS — T730XXA Starvation, initial encounter: Secondary | ICD-10-CM

## 2023-11-18 DIAGNOSIS — E860 Dehydration: Secondary | ICD-10-CM | POA: Diagnosis present

## 2023-11-18 DIAGNOSIS — Z888 Allergy status to other drugs, medicaments and biological substances status: Secondary | ICD-10-CM

## 2023-11-18 DIAGNOSIS — Z91018 Allergy to other foods: Secondary | ICD-10-CM

## 2023-11-18 DIAGNOSIS — D72829 Elevated white blood cell count, unspecified: Secondary | ICD-10-CM | POA: Diagnosis present

## 2023-11-18 DIAGNOSIS — Z90711 Acquired absence of uterus with remaining cervical stump: Secondary | ICD-10-CM

## 2023-11-18 DIAGNOSIS — S92426A Nondisplaced fracture of distal phalanx of unspecified great toe, initial encounter for closed fracture: Secondary | ICD-10-CM

## 2023-11-18 DIAGNOSIS — E872 Acidosis, unspecified: Secondary | ICD-10-CM

## 2023-11-18 DIAGNOSIS — Z91010 Allergy to peanuts: Secondary | ICD-10-CM

## 2023-11-18 DIAGNOSIS — S92425A Nondisplaced fracture of distal phalanx of left great toe, initial encounter for closed fracture: Secondary | ICD-10-CM | POA: Diagnosis present

## 2023-11-18 DIAGNOSIS — F1220 Cannabis dependence, uncomplicated: Secondary | ICD-10-CM | POA: Diagnosis present

## 2023-11-18 DIAGNOSIS — I10 Essential (primary) hypertension: Secondary | ICD-10-CM | POA: Diagnosis present

## 2023-11-18 DIAGNOSIS — Z9049 Acquired absence of other specified parts of digestive tract: Secondary | ICD-10-CM

## 2023-11-18 DIAGNOSIS — R6511 Systemic inflammatory response syndrome (SIRS) of non-infectious origin with acute organ dysfunction: Secondary | ICD-10-CM | POA: Diagnosis present

## 2023-11-18 DIAGNOSIS — Z765 Malingerer [conscious simulation]: Secondary | ICD-10-CM

## 2023-11-18 DIAGNOSIS — E8729 Other acidosis: Secondary | ICD-10-CM | POA: Diagnosis present

## 2023-11-18 LAB — URINALYSIS, MICROSCOPIC (REFLEX)

## 2023-11-18 LAB — CBC WITH DIFFERENTIAL/PLATELET
Abs Immature Granulocytes: 0.19 10*3/uL — ABNORMAL HIGH (ref 0.00–0.07)
Basophils Absolute: 0 10*3/uL (ref 0.0–0.1)
Basophils Relative: 0 %
Eosinophils Absolute: 0 10*3/uL (ref 0.0–0.5)
Eosinophils Relative: 0 %
HCT: 46.2 % — ABNORMAL HIGH (ref 36.0–46.0)
Hemoglobin: 16.6 g/dL — ABNORMAL HIGH (ref 12.0–15.0)
Immature Granulocytes: 1 %
Lymphocytes Relative: 5 %
Lymphs Abs: 1.1 10*3/uL (ref 0.7–4.0)
MCH: 31.6 pg (ref 26.0–34.0)
MCHC: 35.9 g/dL (ref 30.0–36.0)
MCV: 88 fL (ref 80.0–100.0)
Monocytes Absolute: 1.2 10*3/uL — ABNORMAL HIGH (ref 0.1–1.0)
Monocytes Relative: 6 %
Neutro Abs: 19.8 10*3/uL — ABNORMAL HIGH (ref 1.7–7.7)
Neutrophils Relative %: 88 %
Platelets: 339 10*3/uL (ref 150–400)
RBC: 5.25 MIL/uL — ABNORMAL HIGH (ref 3.87–5.11)
RDW: 12.2 % (ref 11.5–15.5)
WBC: 22.3 10*3/uL — ABNORMAL HIGH (ref 4.0–10.5)
nRBC: 0 % (ref 0.0–0.2)

## 2023-11-18 LAB — URINALYSIS, ROUTINE W REFLEX MICROSCOPIC
Glucose, UA: NEGATIVE mg/dL
Ketones, ur: NEGATIVE mg/dL
Leukocytes,Ua: NEGATIVE
Nitrite: NEGATIVE
Protein, ur: 100 mg/dL — AB
Specific Gravity, Urine: >=1.03 (ref 1.005–1.030)
pH: 5 (ref 5.0–8.0)

## 2023-11-18 LAB — COMPREHENSIVE METABOLIC PANEL WITH GFR
ALT: 12 U/L (ref 0–44)
AST: 17 U/L (ref 15–41)
Albumin: 5.4 g/dL — ABNORMAL HIGH (ref 3.5–5.0)
Alkaline Phosphatase: 101 U/L (ref 38–126)
Anion gap: 36 — ABNORMAL HIGH (ref 5–15)
BUN: 76 mg/dL — ABNORMAL HIGH (ref 6–20)
CO2: 14 mmol/L — ABNORMAL LOW (ref 22–32)
Calcium: 10.4 mg/dL — ABNORMAL HIGH (ref 8.9–10.3)
Chloride: 79 mmol/L — ABNORMAL LOW (ref 98–111)
Creatinine, Ser: 6.62 mg/dL — ABNORMAL HIGH (ref 0.44–1.00)
GFR, Estimated: 8 mL/min — ABNORMAL LOW (ref >60)
Glucose, Bld: 111 mg/dL — ABNORMAL HIGH (ref 70–99)
Potassium: 4.1 mmol/L (ref 3.5–5.1)
Sodium: 129 mmol/L — ABNORMAL LOW (ref 135–145)
Total Bilirubin: 0.8 mg/dL (ref 0.0–1.2)
Total Protein: 10 g/dL — ABNORMAL HIGH (ref 6.5–8.1)

## 2023-11-18 LAB — I-STAT VENOUS BLOOD GAS, ED
Acid-base deficit: 3 mmol/L — ABNORMAL HIGH (ref 0.0–2.0)
Bicarbonate: 21.9 mmol/L (ref 20.0–28.0)
Calcium, Ion: 1.06 mmol/L — ABNORMAL LOW (ref 1.15–1.40)
HCT: 44 % (ref 36.0–46.0)
Hemoglobin: 15 g/dL (ref 12.0–15.0)
O2 Saturation: 40 %
Patient temperature: 98.4
Potassium: 4 mmol/L (ref 3.5–5.1)
Sodium: 128 mmol/L — ABNORMAL LOW (ref 135–145)
TCO2: 23 mmol/L (ref 22–32)
pCO2, Ven: 38.8 mmHg — ABNORMAL LOW (ref 44–60)
pH, Ven: 7.359 (ref 7.25–7.43)
pO2, Ven: 24 mmHg — CL (ref 32–45)

## 2023-11-18 LAB — LACTIC ACID, PLASMA
Lactic Acid, Venous: 2.5 mmol/L (ref 0.5–1.9)
Lactic Acid, Venous: 1.3 mmol/L (ref 0.5–1.9)

## 2023-11-18 LAB — LIPASE, BLOOD: Lipase: 25 U/L (ref 11–51)

## 2023-11-18 LAB — BETA-HYDROXYBUTYRIC ACID: Beta-Hydroxybutyric Acid: 2.96 mmol/L — ABNORMAL HIGH (ref 0.05–0.27)

## 2023-11-18 MED ORDER — HYDROMORPHONE HCL 1 MG/ML IJ SOLN
0.5000 mg | Freq: Once | INTRAMUSCULAR | Status: AC
  Administered 2023-11-18: 0.5 mg via INTRAVENOUS
  Filled 2023-11-18: qty 1

## 2023-11-18 MED ORDER — PROMETHAZINE HCL 25 MG/ML IJ SOLN
INTRAMUSCULAR | Status: AC
Start: 2023-11-18 — End: 2023-11-18
  Filled 2023-11-18: qty 1

## 2023-11-18 MED ORDER — LACTATED RINGERS IV SOLN: INTRAVENOUS | Status: DC

## 2023-11-18 MED ORDER — DEXTROSE IN LACTATED RINGERS 5 % IV SOLN: INTRAVENOUS | Status: AC

## 2023-11-18 MED ORDER — SODIUM CHLORIDE 0.9 % IV SOLN
25.0000 mg | Freq: Four times a day (QID) | INTRAVENOUS | Status: DC | PRN
  Administered 2023-11-18 – 2023-11-19 (×2): 25 mg via INTRAVENOUS
  Filled 2023-11-18: qty 1

## 2023-11-18 MED ORDER — LACTATED RINGERS IV BOLUS
1000.0000 mL | Freq: Once | INTRAVENOUS | Status: AC
  Administered 2023-11-18: 1000 mL via INTRAVENOUS

## 2023-11-18 MED ORDER — SODIUM CHLORIDE 0.9 % IV BOLUS
1000.0000 mL | Freq: Once | INTRAVENOUS | Status: AC
  Administered 2023-11-18: 1000 mL via INTRAVENOUS

## 2023-11-18 NOTE — ED Notes (Signed)
 Primary RN aware of Lactic 2.5

## 2023-11-18 NOTE — ED Triage Notes (Signed)
 Pt reports NV, general abd pain x 1 wk; also c/o LT great toe pain that she thinks she broke when she fell

## 2023-11-18 NOTE — ED Notes (Signed)
 Patient transported to X-ray

## 2023-11-18 NOTE — ED Notes (Signed)
 Pt states that they have to use the US  to get her IV.

## 2023-11-18 NOTE — ED Notes (Signed)
Pt unable to provide UA.

## 2023-11-18 NOTE — ED Notes (Signed)
 ED Provider at bedside.

## 2023-11-18 NOTE — ED Provider Notes (Signed)
 [place] [facility] Provider Note  CSN: [id number] Arrival date & time: 11/18/23 1553  Chief Complaint(s) Abdominal Pain  HPI Sherry Key is a 38 y.o. female history of cyclical vomiting syndrome presenting to the emergency department with vomiting.  Patient reports nausea and vomiting, abdominal pain for 1 week.  Reports it feels similar to prior episodes of cyclical vomiting.  No fevers or chills.  No hematemesis.  No melena or diarrhea.  She also reports that she stubbed her toe.  Reports left great toe pain.   Past Medical History Past Medical History:  Diagnosis Date   Cannabinoid hyperemesis syndrome    Crohn's disease (HCC)    Cyclical vomiting    Drug-seeking behavior - pt seeking opiates specifically dilaudid  and IV phenergan . 09/24/2023   Endometriosis    Fibroids    History of partial hysterectomy 10/24/2019   Hypertension    PCOS (polycystic ovarian syndrome)    Patient Active Problem List   Diagnosis Date Noted   AKI (acute kidney injury) (HCC) 11/18/2023   Drug-seeking behavior - pt seeking opiates specifically dilaudid  and IV phenergan . 09/24/2023   Cannabis hyperemesis syndrome concurrent with and due to cannabis dependence (HCC) 09/23/2023   Diarrhea 09/23/2023   Accelerated hypertension 09/22/2023   Hypokalemia 09/22/2023   Dehydration 09/22/2023   Idiopathic intracranial hypertension 09/22/2023   Intractable nausea and vomiting 09/20/2023   Abdominal pain with vomiting 01/16/2023   Leukocytosis 01/16/2023   Home Medication(s) Prior to Admission medications   Medication Sig Start Date End Date Taking? Authorizing Provider  acetaminophen  (TYLENOL ) 500 MG tablet Take 500 mg by mouth every 6 (six) hours as needed for moderate pain or mild pain.    [provider]  acetaZOLAMIDE  (DIAMOX ) 250 MG tablet Take 2 tablets (500 mg total) by mouth at bedtime. Patient not taking: Reported on 09/22/2023 01/21/23    Regalado, Clifford Dam A, MD  diphenhydrAMINE  (BENADRYL ) 25 MG tablet Take 25 mg by mouth every 6 (six) hours as needed for allergies.    [provider]  Multiple Vitamins-Minerals (CERTAVITE/ANTIOXIDANTS) TABS Take 1 tablet by mouth daily. 01/20/23   Regalado, Belkys A, MD  promethazine  (PHENERGAN ) 12.5 MG tablet Take 1 tablet (12.5 mg total) by mouth every 8 (eight) hours as needed for up to 5 days for nausea or vomiting. 01/20/23 01/25/23  Regalado, Clifford Dam A, MD  topiramate  (TOPAMAX ) 25 MG tablet Take 1 tablet (25 mg total) by mouth at bedtime. May increase to 50mg  after 7 days Patient taking differently: Take 25 mg by mouth at bedtime. 11/03/22 11/03/23  Suellyn Emory, MD                                                                                                                                    Past Surgical History Past Surgical History:  Procedure Laterality Date   ABDOMINAL HYSTERECTOMY     CESAREAN SECTION  CESAREAN SECTION     CHOLECYSTECTOMY     HERNIA REPAIR     TONSILLECTOMY     TUBAL LIGATION     TUBAL LIGATION     Family History History reviewed. No pertinent family history.  Social History Social History   Tobacco Use   Smoking status: Former    Current packs/day: 0.50    Types: Cigarettes   Smokeless tobacco: Never  Vaping Use   Vaping status: Never Used  Substance Use Topics   Alcohol use: No   Drug use: Yes    Types: Marijuana   Allergies Dicyclomine , Morphine , Oxycodone-acetaminophen , Peanut-containing drug products, Zofran  [ondansetron  hcl], Ketorolac , Droperidol , Other, Percocet [oxycodone-acetaminophen ], Tomato, Zofran , and Haloperidol   Review of Systems Review of Systems  All other systems reviewed and are negative.   Physical Exam Vital Signs  I have reviewed the triage vital signs BP (!) 144/100   Pulse (!) 108   Temp 98 F (36.7 C) (Oral)   Resp 18   Ht 5\' 3"  (1.6 m)   Wt 72.6 kg   LMP 10/19/2019   SpO2 94%   BMI 28.35  kg/m  Physical Exam Vitals and nursing note reviewed.  Constitutional:      General: She is not in acute distress.    Appearance: She is well-developed.  HENT:     Head: Normocephalic and atraumatic.     Mouth/Throat:     Mouth: Mucous membranes are moist.  Eyes:     Pupils: Pupils are equal, round, and reactive to light.  Cardiovascular:     Rate and Rhythm: Regular rhythm. Tachycardia present.     Heart sounds: No murmur heard. Pulmonary:     Effort: Pulmonary effort is normal. No respiratory distress.     Breath sounds: Normal breath sounds.  Abdominal:     General: Abdomen is flat.     Palpations: Abdomen is soft.     Tenderness: There is generalized abdominal tenderness.  Musculoskeletal:        General: No tenderness.     Right lower leg: No edema.     Left lower leg: No edema.     Comments: Tenderness to the left great toe.  No other foot abnormalities.  No wound  Skin:    General: Skin is warm and dry.  Neurological:     General: No focal deficit present.     Mental Status: She is alert. Mental status is at baseline.  Psychiatric:        Mood and Affect: Mood normal.        Behavior: Behavior normal.     ED Results and Treatments Labs (all labs ordered are listed, but only abnormal results are displayed) Labs Reviewed  URINALYSIS, ROUTINE W REFLEX MICROSCOPIC - Abnormal; Notable for the following components:      Result Value   APPearance HAZY (*)    Hgb urine dipstick TRACE (*)    Bilirubin Urine LARGE (*)    Protein, ur 100 (*)    All other components within normal limits  COMPREHENSIVE METABOLIC PANEL WITH GFR - Abnormal; Notable for the following components:   Sodium 129 (*)    Chloride 79 (*)    CO2 14 (*)    Glucose, Bld 111 (*)    BUN 76 (*)    Creatinine, Ser 6.62 (*)    Calcium 10.4 (*)    Total Protein 10.0 (*)    Albumin 5.4 (*)    GFR, Estimated 8 (*)  Anion gap 36 (*)    All other components within normal limits  CBC WITH  DIFFERENTIAL/PLATELET - Abnormal; Notable for the following components:   WBC 22.3 (*)    RBC 5.25 (*)    Hemoglobin 16.6 (*)    HCT 46.2 (*)    Neutro Abs 19.8 (*)    Monocytes Absolute 1.2 (*)    Abs Immature Granulocytes 0.19 (*)    All other components within normal limits  BETA-HYDROXYBUTYRIC ACID - Abnormal; Notable for the following components:   Beta-Hydroxybutyric Acid 2.96 (*)    All other components within normal limits  LACTIC ACID, PLASMA - Abnormal; Notable for the following components:   Lactic Acid, Venous 2.5 (*)    All other components within normal limits  URINALYSIS, MICROSCOPIC (REFLEX) - Abnormal; Notable for the following components:   Bacteria, UA MANY (*)    All other components within normal limits  I-STAT VENOUS BLOOD GAS, ED - Abnormal; Notable for the following components:   pCO2, Ven 38.8 (*)    pO2, Ven 24 (*)    Acid-base deficit 3.0 (*)    Sodium 128 (*)    Calcium, Ion 1.06 (*)    All other components within normal limits  CULTURE, BLOOD (ROUTINE X 2)  CULTURE, BLOOD (ROUTINE X 2)  LIPASE, BLOOD  LACTIC ACID, PLASMA  BASIC METABOLIC PANEL WITH GFR                                                                                                                          Radiology CT ABDOMEN PELVIS WO CONTRAST Result Date: 11/18/2023 CLINICAL DATA:  Abdominal pain with nausea and vomiting. EXAM: CT ABDOMEN AND PELVIS WITHOUT CONTRAST TECHNIQUE: Multidetector CT imaging of the abdomen and pelvis was performed following the standard protocol without IV contrast. RADIATION DOSE REDUCTION: This exam was performed according to the departmental dose-optimization program which includes automated exposure control, adjustment of the mA and/or kV according to patient size and/or use of iterative reconstruction technique. COMPARISON:  September 22, 2023 FINDINGS: Lower chest: No acute abnormality. Hepatobiliary: No focal liver abnormality is seen. Status post  cholecystectomy. No biliary dilatation. Pancreas: Unremarkable. No pancreatic ductal dilatation or surrounding inflammatory changes. The small cystic lesion seen within the tail of the pancreas on the prior study is not clearly identified. Spleen: Normal in size without focal abnormality. Adrenals/Urinary Tract: Adrenal glands are unremarkable. Kidneys are normal, without renal calculi, focal lesion, or hydronephrosis. Bladder is unremarkable. Stomach/Bowel: Stomach is within normal limits. Appendix appears normal. No evidence of bowel wall thickening, distention, or inflammatory changes. Vascular/Lymphatic: No significant vascular findings are present. No enlarged abdominal or pelvic lymph nodes. Reproductive: Status post hysterectomy. No adnexal masses. Other: No abdominal wall hernia or abnormality. No abdominopelvic ascites. Musculoskeletal: No acute or significant osseous findings. IMPRESSION: 1. No acute or active process within the abdomen or pelvis. 2. Evidence of prior cholecystectomy and hysterectomy. Electronically Signed   By: Elizabeth Gulling.D.  On: 11/18/2023 21:50   DG Toe Great Left Result Date: 11/18/2023 CLINICAL DATA:  Pain after fall, injuring great toe. EXAM: LEFT GREAT TOE COMPARISON:  None Available. FINDINGS: Possible transverse nondisplaced fracture of the great toe distal phalanx. There is chronic irregularity of the distal tuft. Normal alignment without dislocation. Soft tissue edema about the digit. IMPRESSION: Possible transverse nondisplaced fracture of the great toe distal phalanx. Chronic irregularity of the distal tuft. Electronically Signed   By: Chadwick Colonel M.D.   On: 11/18/2023 18:23    Pertinent labs & imaging results that were available during my care of the patient were reviewed by me and considered in my medical decision making (see MDM for details).  Medications Ordered in ED Medications  promethazine  (PHENERGAN ) 25 mg in sodium chloride  0.9 % 50 mL IVPB  (0 mg Intravenous Stopped 11/18/23 2145)  dextrose  5 % in lactated ringers  infusion (has no administration in time range)  lactated ringers  bolus 1,000 mL (has no administration in time range)  lactated ringers  bolus 1,000 mL (0 mLs Intravenous Stopped 11/18/23 2145)  promethazine  (PHENERGAN ) 25 MG/ML injection (  Rx Charged 11/18/23 1937)  HYDROmorphone  (DILAUDID ) injection 0.5 mg (0.5 mg Intravenous Given 11/18/23 1940)  HYDROmorphone  (DILAUDID ) injection 0.5 mg (0.5 mg Intravenous Given 11/18/23 2038)  sodium chloride  0.9 % bolus 1,000 mL (1,000 mLs Intravenous New Bag/Given 11/18/23 2151)                                                                                                                                     Procedures .Critical Care  Performed by: Mordecai Applebaum, MD Authorized by: Mordecai Applebaum, MD   Critical care provider statement:    Critical care time (minutes):  30   Critical care was necessary to treat or prevent imminent or life-threatening deterioration of the following conditions:  Metabolic crisis, renal failure and dehydration   Critical care was time spent personally by me on the following activities:  Development of treatment plan with patient or surrogate, discussions with consultants, evaluation of patient's response to treatment, examination of patient, ordering and review of laboratory studies, ordering and review of radiographic studies, ordering and performing treatments and interventions, pulse oximetry, re-evaluation of patient's condition and review of old charts   Care discussed with: admitting provider and accepting provider at another facility     (including critical care time)  Medical Decision Making / ED Course   MDM:  38 year old presenting to the emergency department nausea and vomiting, abdominal pain.  Patient overall well-appearing, vitals with mild tachycardia.  Patient retching.  Has mild diffuse abdominal tenderness.  Suspect  symptoms due to underlying cyclical vomiting.  She reports this feels similar.  Will treat her symptoms.  Will reassess.  Labs were obtained and are notable for significant leukocytosis, given tenderness, will obtain CT scan to evaluate for other acute process such as obstruction, perforation, volvulus, abscess or  other dangerous process as cause of her abdominal pain, nausea and vomiting.  Will give fluids.  Suspect leukocytosis probably related somewhat to mild dehydration.  Will reassess.  Patient has required admission for this in the past but will attempt to control symptoms if no dangerous cause of her pain is identified.  She also stubbed her toe recent. Xr shows nondisplaced fx. No wound. Will give post op shoe  Clinical Course as of 11/18/23 2326  Sun Nov 18, 2023  2159 CT ABDOMEN PELVIS WO CONTRAST [WS]  2323 Labs are significantly abnormal with evidence of acute renal failure, metabolic acidosis.  Patient has lactic acidosis as well as elevated ketones suspicious for starvation ketosis.  Will start on D5, has received significant amount of fluids.  No fevers to suggest underlying infection.  Blood cultures were sent.  Patient still making urine.  Will need admission for further monitoring of acute renal failure.  Discussed with the hospitalist Dr. Michell Ahumada who will admit patient [WS]    Clinical Course User Index [WS] Mordecai Applebaum, MD     Additional history obtained:  -External records from outside source obtained and reviewed including: Chart review including previous notes, labs, imaging, consultation notes including prior ER visits    Lab Tests: -I ordered, reviewed, and interpreted labs.   The pertinent results include:   Labs Reviewed  URINALYSIS, ROUTINE W REFLEX MICROSCOPIC - Abnormal; Notable for the following components:      Result Value   APPearance HAZY (*)    Hgb urine dipstick TRACE (*)    Bilirubin Urine LARGE (*)    Protein, ur 100 (*)    All other  components within normal limits  COMPREHENSIVE METABOLIC PANEL WITH GFR - Abnormal; Notable for the following components:   Sodium 129 (*)    Chloride 79 (*)    CO2 14 (*)    Glucose, Bld 111 (*)    BUN 76 (*)    Creatinine, Ser 6.62 (*)    Calcium 10.4 (*)    Total Protein 10.0 (*)    Albumin 5.4 (*)    GFR, Estimated 8 (*)    Anion gap 36 (*)    All other components within normal limits  CBC WITH DIFFERENTIAL/PLATELET - Abnormal; Notable for the following components:   WBC 22.3 (*)    RBC 5.25 (*)    Hemoglobin 16.6 (*)    HCT 46.2 (*)    Neutro Abs 19.8 (*)    Monocytes Absolute 1.2 (*)    Abs Immature Granulocytes 0.19 (*)    All other components within normal limits  BETA-HYDROXYBUTYRIC ACID - Abnormal; Notable for the following components:   Beta-Hydroxybutyric Acid 2.96 (*)    All other components within normal limits  LACTIC ACID, PLASMA - Abnormal; Notable for the following components:   Lactic Acid, Venous 2.5 (*)    All other components within normal limits  URINALYSIS, MICROSCOPIC (REFLEX) - Abnormal; Notable for the following components:   Bacteria, UA MANY (*)    All other components within normal limits  I-STAT VENOUS BLOOD GAS, ED - Abnormal; Notable for the following components:   pCO2, Ven 38.8 (*)    pO2, Ven 24 (*)    Acid-base deficit 3.0 (*)    Sodium 128 (*)    Calcium, Ion 1.06 (*)    All other components within normal limits  CULTURE, BLOOD (ROUTINE X 2)  CULTURE, BLOOD (ROUTINE X 2)  LIPASE, BLOOD  LACTIC ACID, PLASMA  BASIC METABOLIC PANEL WITH GFR    Notable for acute renal failure    Imaging Studies ordered: I ordered imaging studies including CT abdomen On my interpretation imaging demonstrates no acute process I independently visualized and interpreted imaging. I agree with the radiologist interpretation   Medicines ordered and prescription drug management: Meds ordered this encounter  Medications   lactated ringers  bolus 1,000  mL   promethazine  (PHENERGAN ) 25 mg in sodium chloride  0.9 % 50 mL IVPB   promethazine  (PHENERGAN ) 25 MG/ML injection    Kennedy-Hager, Antho: cabinet override   HYDROmorphone  (DILAUDID ) injection 0.5 mg   HYDROmorphone  (DILAUDID ) injection 0.5 mg   sodium chloride  0.9 % bolus 1,000 mL   DISCONTD: lactated ringers  infusion   dextrose  5 % in lactated ringers  infusion   lactated ringers  bolus 1,000 mL    -I have reviewed the patients home medicines and have made adjustments as needed   Consultations Obtained: I requested consultation with the hospitalist,  and discussed lab and imaging findings as well as pertinent plan - they recommend: admission   Cardiac Monitoring: The patient was maintained on a cardiac monitor.  I personally viewed and interpreted the cardiac monitored which showed an underlying rhythm of: sinus tachycardia   Social Determinants of Health:  Diagnosis or treatment significantly limited by social determinants of health: marijuana use   Reevaluation: After the interventions noted above, I reevaluated the patient and found that their symptoms have improved  Co morbidities that complicate the patient evaluation  Past Medical History:  Diagnosis Date   Cannabinoid hyperemesis syndrome    Crohn's disease (HCC)    Cyclical vomiting    Drug-seeking behavior - pt seeking opiates specifically dilaudid  and IV phenergan . 09/24/2023   Endometriosis    Fibroids    History of partial hysterectomy 10/24/2019   Hypertension    PCOS (polycystic ovarian syndrome)       Dispostion: Disposition decision including need for hospitalization was considered, and patient admitted to the hospital.    Final Clinical Impression(s) / ED Diagnoses Final diagnoses:  Acute renal failure, unspecified acute renal failure type (HCC)  Metabolic acidosis, increased anion gap (IAG)  Starvation ketoacidosis  Lactic acidosis  Cyclical vomiting  Closed nondisplaced fracture of distal  phalanx of great toe, unspecified laterality, initial encounter     This chart was dictated using voice recognition software.  Despite best efforts to proofread,  errors can occur which can change the documentation meaning.    Mordecai Applebaum, MD 11/18/23 2326

## 2023-11-18 NOTE — Plan of Care (Signed)
 Plan of Care Note for accepted transfer   Patient name: Sherry Key MWU:[id number] DOB: 1986/02/15  Facility requesting transfer: [facility] ED Requesting Provider: Dr. Isaiah Marc Facility course: 38 year old female with history of cannabis abuse/cannabinoid hyperemesis syndrome, hypertension, idiopathic intracranial hypertension, prior cholecystectomy and hysterectomy presented to ED with complaints of nausea, vomiting, and generalized abdominal pain.  Also reported left great toe injury and pain.  Tachycardic to 100-110s but afebrile.  Labs showing WBC count 22.3, sodium 129, chloride 79, bicarb 14, anion gap 36, glucose 111, BUN 76, creatinine 6.6 (baseline 0.6-0.8), normal lipase and LFTs, beta hydroxybutyric acid pending, lactate 2.5, VBG showing pH 7.35.  UA with negative nitrite, negative leukocytes, and microscopy showing 11-20 WBCs and many bacteria.  Blood cultures collected.  CT abdomen pelvis negative for acute findings.  X-ray of left great toe showing possible transverse nondisplaced fracture of the great toe distal phalanx.  Patient was given IV Dilaudid , Phenergan , and 2 L IV fluids.  Plan of care: The patient is accepted for admission to Progressive unit at [facility].  [facility] will assume care on arrival to accepting facility. Until arrival, care as per EDP. However, [facility] available 24/7 for questions and assistance.  Check [email or web address] for on-call coverage.  Nursing staff, please call [facility] Admits & Consults System-Wide number under Amion on patient's arrival so appropriate admitting provider can evaluate the pt.

## 2023-11-19 DIAGNOSIS — Z91018 Allergy to other foods: Secondary | ICD-10-CM | POA: Diagnosis not present

## 2023-11-19 DIAGNOSIS — R6511 Systemic inflammatory response syndrome (SIRS) of non-infectious origin with acute organ dysfunction: Secondary | ICD-10-CM | POA: Diagnosis present

## 2023-11-19 DIAGNOSIS — F1220 Cannabis dependence, uncomplicated: Secondary | ICD-10-CM | POA: Diagnosis present

## 2023-11-19 DIAGNOSIS — E860 Dehydration: Secondary | ICD-10-CM | POA: Diagnosis present

## 2023-11-19 DIAGNOSIS — Z885 Allergy status to narcotic agent status: Secondary | ICD-10-CM | POA: Diagnosis not present

## 2023-11-19 DIAGNOSIS — I10 Essential (primary) hypertension: Secondary | ICD-10-CM | POA: Diagnosis present

## 2023-11-19 DIAGNOSIS — Z765 Malingerer [conscious simulation]: Secondary | ICD-10-CM | POA: Diagnosis not present

## 2023-11-19 DIAGNOSIS — Z90711 Acquired absence of uterus with remaining cervical stump: Secondary | ICD-10-CM | POA: Diagnosis not present

## 2023-11-19 DIAGNOSIS — R000 Tachycardia, unspecified: Secondary | ICD-10-CM | POA: Diagnosis present

## 2023-11-19 DIAGNOSIS — Z9049 Acquired absence of other specified parts of digestive tract: Secondary | ICD-10-CM | POA: Diagnosis not present

## 2023-11-19 DIAGNOSIS — N179 Acute kidney failure, unspecified: Secondary | ICD-10-CM | POA: Diagnosis present

## 2023-11-19 DIAGNOSIS — W228XXA Striking against or struck by other objects, initial encounter: Secondary | ICD-10-CM | POA: Diagnosis present

## 2023-11-19 DIAGNOSIS — K5090 Crohn's disease, unspecified, without complications: Secondary | ICD-10-CM | POA: Diagnosis present

## 2023-11-19 DIAGNOSIS — Z79899 Other long term (current) drug therapy: Secondary | ICD-10-CM | POA: Diagnosis not present

## 2023-11-19 DIAGNOSIS — Z91010 Allergy to peanuts: Secondary | ICD-10-CM | POA: Diagnosis not present

## 2023-11-19 DIAGNOSIS — D72829 Elevated white blood cell count, unspecified: Secondary | ICD-10-CM | POA: Diagnosis present

## 2023-11-19 DIAGNOSIS — R1084 Generalized abdominal pain: Secondary | ICD-10-CM | POA: Diagnosis present

## 2023-11-19 DIAGNOSIS — S92425A Nondisplaced fracture of distal phalanx of left great toe, initial encounter for closed fracture: Secondary | ICD-10-CM | POA: Diagnosis present

## 2023-11-19 DIAGNOSIS — F17210 Nicotine dependence, cigarettes, uncomplicated: Secondary | ICD-10-CM | POA: Diagnosis present

## 2023-11-19 DIAGNOSIS — R112 Nausea with vomiting, unspecified: Secondary | ICD-10-CM | POA: Diagnosis present

## 2023-11-19 DIAGNOSIS — E8729 Other acidosis: Secondary | ICD-10-CM | POA: Diagnosis present

## 2023-11-19 DIAGNOSIS — Z888 Allergy status to other drugs, medicaments and biological substances status: Secondary | ICD-10-CM | POA: Diagnosis not present

## 2023-11-19 LAB — CBC WITH DIFFERENTIAL/PLATELET
Abs Immature Granulocytes: 0.15 10*3/uL — ABNORMAL HIGH (ref 0.00–0.07)
Basophils Absolute: 0 10*3/uL (ref 0.0–0.1)
Basophils Relative: 0 %
Eosinophils Absolute: 0 10*3/uL (ref 0.0–0.5)
Eosinophils Relative: 0 %
HCT: 35.7 % — ABNORMAL LOW (ref 36.0–46.0)
Hemoglobin: 12.7 g/dL (ref 12.0–15.0)
Immature Granulocytes: 1 %
Lymphocytes Relative: 7 %
Lymphs Abs: 1.2 10*3/uL (ref 0.7–4.0)
MCH: 31.9 pg (ref 26.0–34.0)
MCHC: 35.6 g/dL (ref 30.0–36.0)
MCV: 89.7 fL (ref 80.0–100.0)
Monocytes Absolute: 2 10*3/uL — ABNORMAL HIGH (ref 0.1–1.0)
Monocytes Relative: 11 %
Neutro Abs: 14.5 10*3/uL — ABNORMAL HIGH (ref 1.7–7.7)
Neutrophils Relative %: 81 %
Platelets: 254 10*3/uL (ref 150–400)
RBC: 3.98 MIL/uL (ref 3.87–5.11)
RDW: 12.2 % (ref 11.5–15.5)
WBC: 17.9 10*3/uL — ABNORMAL HIGH (ref 4.0–10.5)
nRBC: 0 % (ref 0.0–0.2)

## 2023-11-19 LAB — BASIC METABOLIC PANEL WITH GFR
Anion gap: 10 (ref 5–15)
Anion gap: 25 — ABNORMAL HIGH (ref 5–15)
BUN: 55 mg/dL — ABNORMAL HIGH (ref 6–20)
BUN: 72 mg/dL — ABNORMAL HIGH (ref 6–20)
CO2: 18 mmol/L — ABNORMAL LOW (ref 22–32)
CO2: 26 mmol/L (ref 22–32)
Calcium: 8.7 mg/dL — ABNORMAL LOW (ref 8.9–10.3)
Calcium: 9.1 mg/dL (ref 8.9–10.3)
Chloride: 90 mmol/L — ABNORMAL LOW (ref 98–111)
Chloride: 97 mmol/L — ABNORMAL LOW (ref 98–111)
Creatinine, Ser: 2.12 mg/dL — ABNORMAL HIGH (ref 0.44–1.00)
Creatinine, Ser: 4.84 mg/dL — ABNORMAL HIGH (ref 0.44–1.00)
GFR, Estimated: 11 mL/min — ABNORMAL LOW (ref >60)
GFR, Estimated: 30 mL/min — ABNORMAL LOW (ref >60)
Glucose, Bld: 102 mg/dL — ABNORMAL HIGH (ref 70–99)
Glucose, Bld: 124 mg/dL — ABNORMAL HIGH (ref 70–99)
Potassium: 3.8 mmol/L (ref 3.5–5.1)
Potassium: 3.8 mmol/L (ref 3.5–5.1)
Sodium: 133 mmol/L — ABNORMAL LOW (ref 135–145)
Sodium: 133 mmol/L — ABNORMAL LOW (ref 135–145)

## 2023-11-19 LAB — TROPONIN I (HIGH SENSITIVITY)
Troponin I (High Sensitivity): 12 ng/L (ref <18)
Troponin I (High Sensitivity): 10 ng/L (ref <18)

## 2023-11-19 LAB — CBC
HCT: 38.7 % (ref 36.0–46.0)
Hemoglobin: 13.5 g/dL (ref 12.0–15.0)
MCH: 31.6 pg (ref 26.0–34.0)
MCHC: 34.9 g/dL (ref 30.0–36.0)
MCV: 90.6 fL (ref 80.0–100.0)
Platelets: 236 10*3/uL (ref 150–400)
RBC: 4.27 MIL/uL (ref 3.87–5.11)
RDW: 12.4 % (ref 11.5–15.5)
WBC: 15.8 10*3/uL — ABNORMAL HIGH (ref 4.0–10.5)
nRBC: 0 % (ref 0.0–0.2)

## 2023-11-19 LAB — RAPID URINE DRUG SCREEN, HOSP PERFORMED
Amphetamines: NOT DETECTED
Barbiturates: NOT DETECTED
Benzodiazepines: NOT DETECTED
Cocaine: NOT DETECTED
Opiates: NOT DETECTED
Tetrahydrocannabinol: POSITIVE — AB

## 2023-11-19 LAB — CREATININE, SERUM
Creatinine, Ser: 1.58 mg/dL — ABNORMAL HIGH (ref 0.44–1.00)
GFR, Estimated: 43 mL/min — ABNORMAL LOW (ref >60)

## 2023-11-19 MED ORDER — ACETAMINOPHEN 500 MG PO TABS
500.0000 mg | ORAL_TABLET | Freq: Four times a day (QID) | ORAL | Status: DC
  Administered 2023-11-19: 500 mg via ORAL
  Filled 2023-11-19: qty 1

## 2023-11-19 MED ORDER — HYDROMORPHONE HCL 1 MG/ML IJ SOLN
0.5000 mg | INTRAMUSCULAR | Status: DC | PRN
  Administered 2023-11-19 (×3): 0.5 mg via INTRAVENOUS
  Filled 2023-11-19 (×3): qty 1

## 2023-11-19 MED ORDER — HYDRALAZINE HCL 50 MG PO TABS
50.0000 mg | ORAL_TABLET | Freq: Three times a day (TID) | ORAL | Status: DC | PRN
  Administered 2023-11-19: 50 mg via ORAL
  Filled 2023-11-19: qty 1

## 2023-11-19 MED ORDER — TOPIRAMATE 25 MG PO TABS
25.0000 mg | ORAL_TABLET | Freq: Every day | ORAL | Status: DC
  Filled 2023-11-19: qty 1

## 2023-11-19 MED ORDER — HYDROMORPHONE HCL 1 MG/ML IJ SOLN
0.5000 mg | INTRAMUSCULAR | Status: DC | PRN
  Administered 2023-11-19 – 2023-11-20 (×4): 0.5 mg via INTRAVENOUS
  Filled 2023-11-19 (×4): qty 1

## 2023-11-19 MED ORDER — SODIUM CHLORIDE 0.9 % IV SOLN
25.0000 mg | Freq: Four times a day (QID) | INTRAVENOUS | Status: DC | PRN
  Administered 2023-11-19 – 2023-11-20 (×4): 25 mg via INTRAVENOUS
  Filled 2023-11-19 (×4): qty 1

## 2023-11-19 MED ORDER — ACETAMINOPHEN 500 MG PO TABS
1000.0000 mg | ORAL_TABLET | Freq: Four times a day (QID) | ORAL | Status: DC
Start: 2023-11-19 — End: 2023-11-20
  Administered 2023-11-19 – 2023-11-20 (×2): 1000 mg via ORAL
  Filled 2023-11-19 (×3): qty 2

## 2023-11-19 MED ORDER — PROMETHAZINE HCL 25 MG/ML IJ SOLN
INTRAMUSCULAR | Status: AC
  Administered 2023-11-19: 25 mg via INTRAVENOUS
  Filled 2023-11-19: qty 1

## 2023-11-19 MED ORDER — PROMETHAZINE HCL 12.5 MG PO TABS: 12.5000 mg | ORAL_TABLET | Freq: Four times a day (QID) | ORAL | Status: DC | PRN

## 2023-11-19 MED ORDER — ENSURE ENLIVE PO LIQD: 237.0000 mL | Freq: Two times a day (BID) | ORAL | Status: DC

## 2023-11-19 MED ORDER — PROMETHAZINE HCL 25 MG/ML IJ SOLN
INTRAMUSCULAR | Status: AC
  Filled 2023-11-19: qty 1

## 2023-11-19 MED ORDER — LACTATED RINGERS IV SOLN: INTRAVENOUS | Status: AC

## 2023-11-19 MED ORDER — DIPHENHYDRAMINE HCL 25 MG PO CAPS
25.0000 mg | ORAL_CAPSULE | Freq: Four times a day (QID) | ORAL | Status: DC | PRN
  Administered 2023-11-19: 25 mg via ORAL
  Filled 2023-11-19: qty 1

## 2023-11-19 MED ORDER — HYDROMORPHONE HCL 2 MG PO TABS
1.0000 mg | ORAL_TABLET | ORAL | Status: DC | PRN
  Administered 2023-11-19: 1 mg via ORAL
  Filled 2023-11-19: qty 1

## 2023-11-19 MED ORDER — HEPARIN SODIUM (PORCINE) 5000 UNIT/ML IJ SOLN
5000.0000 [IU] | Freq: Three times a day (TID) | INTRAMUSCULAR | Status: DC
  Administered 2023-11-19 – 2023-11-20 (×3): 5000 [IU] via SUBCUTANEOUS
  Filled 2023-11-19 (×3): qty 1

## 2023-11-19 NOTE — ED Notes (Signed)
 Per ed provider pt cleared for ice chips at this time.

## 2023-11-19 NOTE — ED Notes (Signed)
 ED Provider at bedside.

## 2023-11-19 NOTE — Progress Notes (Signed)
 Orthopedic Tech Progress Note Patient Details:  MEREDETH MELLEMA 1986-02-25 [id number]  Patient has POST OP SHOE   Patient ID: Anastacio Balm, female   DOB: Dec 04, 1985, 38 y.o.   MRN: [id number]  Kermitt Pedlar 11/19/2023, 2:54 PM

## 2023-11-19 NOTE — H&P (Signed)
 History and Physical    Patient: Sherry Key ZOX:[id number] DOB: [birth year] DOA: 11/18/2023 DOS: the patient was seen and examined on 11/19/2023 PCP: Pcp, No  Patient coming from: Home  Chief Complaint:  Chief Complaint  Patient presents with   Abdominal Pain   HPI: Sherry Key is a 38 y.o. female with medical history significant of HTN, and cannabinoid hyperemesis syndrome, and prior cholecystectomy and hysterectomy presented to ED with complaints of nausea, vomiting, and generalized abdominal pain iso AKI.  Pt states that she was in her USOH until this past week when she started having increased n/v. She initially attributed her n/v to being sick, but when it did not resolve she became concerned enough to come to the ED. She states that her intractable n/v was so severe that she has not been able to tolerate anything by mouth for the past week except water, even Gatorade "came back up." Denies any recent sick contacts. Endorses lethargy and dizziness with said n/v. Of note, pt states that she hit her L great toe on the bathroom threshold while trying to make it to the restroom quickly, and believes she may have broken her toe because the "toenail is hanging off."  In the ED, pt noted to have labs notable for Cr >6, lactate >2, WBC >22 and high suspicion for dehydration. Blood and urine cultures pending. Labs improving with hydration. X-ray of left great toe showing possible transverse nondisplaced fracture of the great toe distal phalanx. Patient was given IV Dilaudid , Phenergan , and 2 L IV fluids. Pt admitted to medicine for ongoing care.    Review of Systems: As mentioned in the history of present illness. All other systems reviewed and are negative. Past Medical History:  Diagnosis Date   Cannabinoid hyperemesis syndrome    Crohn's disease (HCC)    Cyclical vomiting    Drug-seeking behavior - pt seeking opiates specifically dilaudid  and IV phenergan . 09/24/2023    Endometriosis    Fibroids    History of partial hysterectomy 10/24/2019   Hypertension    PCOS (polycystic ovarian syndrome)    Past Surgical History:  Procedure Laterality Date   ABDOMINAL HYSTERECTOMY     CESAREAN SECTION     CESAREAN SECTION     CHOLECYSTECTOMY     HERNIA REPAIR     TONSILLECTOMY     TUBAL LIGATION     TUBAL LIGATION     Social History:  reports that she has quit smoking. Her smoking use included cigarettes. She has never used smokeless tobacco. She reports current drug use. Drug: Marijuana. She reports that she does not drink alcohol.  Allergies  Allergen Reactions   Dicyclomine  Itching   Morphine  Itching    Take benadryl  with morphine  to help with itching    Oxycodone-Acetaminophen  Hives, Shortness Of Breath and Itching    Abdominal Pain, too   Peanut-Containing Drug Products Hives and Itching   Zofran  [Ondansetron  Hcl] Hives and Shortness Of Breath   Ketorolac  Other (See Comments)    Caused spasms of tongue and drooling & Dystonia   Droperidol  Other (See Comments)    "Tongue spasm" starts to stick out her tongue and wave it side-to-side.  Maintains airway, oxygenation okay, no respiratory distress no other symptoms.  Consider dystonic reaction vs volitional reaction.   Other Itching    Fuzzy fruit   Percocet [Oxycodone-Acetaminophen ] Hives and Itching   Tomato Hives and Itching   Zofran  Hives and Itching   Haloperidol  Rash  History reviewed. No pertinent family history.  Prior to Admission medications   Medication Sig Start Date End Date Taking? Authorizing Provider  acetaminophen  (TYLENOL ) 500 MG tablet Take 500 mg by mouth every 6 (six) hours as needed for moderate pain or mild pain.    [provider]  acetaZOLAMIDE  (DIAMOX ) 250 MG tablet Take 2 tablets (500 mg total) by mouth at bedtime. Patient not taking: Reported on 09/22/2023 01/21/23   Regalado, Clifford Dam A, MD  diphenhydrAMINE  (BENADRYL ) 25 MG tablet Take 25 mg by mouth every 6  (six) hours as needed for allergies.    [provider]  Multiple Vitamins-Minerals (CERTAVITE/ANTIOXIDANTS) TABS Take 1 tablet by mouth daily. 01/20/23   Regalado, Belkys A, MD  promethazine  (PHENERGAN ) 12.5 MG tablet Take 1 tablet (12.5 mg total) by mouth every 8 (eight) hours as needed for up to 5 days for nausea or vomiting. 01/20/23 01/25/23  Regalado, Clifford Dam A, MD  topiramate  (TOPAMAX ) 25 MG tablet Take 1 tablet (25 mg total) by mouth at bedtime. May increase to 50mg  after 7 days Patient taking differently: Take 25 mg by mouth at bedtime. 11/03/22 11/03/23  Suellyn Emory, MD    Physical Exam: Vitals:   11/19/23 1000 11/19/23 1100 11/19/23 1130 11/19/23 1322  BP: 136/82 (!) 177/119 (!) 177/109 (!) 195/117  Pulse: 94 94 (!) 108   Resp: 16 18 12 20   Temp:    98.8 F (37.1 C)  TempSrc:    Oral  SpO2: 99% 99% 100%   Weight:      Height:       General: Alert, oriented x3, resting comfortably in no acute distress Respiratory: Lungs clear to auscultation bilaterally with normal respiratory effort; no w/r/r Cardiovascular: Regular rate and rhythm w/o m/r/g  Data Reviewed:  Lab Results  Component Value Date   WBC 17.9 (H) 11/19/2023   HGB 12.7 11/19/2023   HCT 35.7 (L) 11/19/2023   MCV 89.7 11/19/2023   PLT 254 11/19/2023   Lab Results  Component Value Date   GLUCOSE 124 (H) 11/19/2023   CALCIUM 9.1 11/19/2023   NA 133 (L) 11/19/2023   K 3.8 11/19/2023   CO2 26 11/19/2023   CL 97 (L) 11/19/2023   BUN 55 (H) 11/19/2023   CREATININE 2.12 (H) 11/19/2023   Lab Results  Component Value Date   ALT 12 11/18/2023   AST 17 11/18/2023   ALKPHOS 101 11/18/2023   BILITOT 0.8 11/18/2023   No results found for: "INR", "PROTIME"  Radiology: CT ABDOMEN PELVIS WO CONTRAST Result Date: 11/18/2023 CLINICAL DATA:  Abdominal pain with nausea and vomiting. EXAM: CT ABDOMEN AND PELVIS WITHOUT CONTRAST TECHNIQUE: Multidetector CT imaging of the abdomen and pelvis was performed  following the standard protocol without IV contrast. RADIATION DOSE REDUCTION: This exam was performed according to the departmental dose-optimization program which includes automated exposure control, adjustment of the mA and/or kV according to patient size and/or use of iterative reconstruction technique. COMPARISON:  September 22, 2023 FINDINGS: Lower chest: No acute abnormality. Hepatobiliary: No focal liver abnormality is seen. Status post cholecystectomy. No biliary dilatation. Pancreas: Unremarkable. No pancreatic ductal dilatation or surrounding inflammatory changes. The small cystic lesion seen within the tail of the pancreas on the prior study is not clearly identified. Spleen: Normal in size without focal abnormality. Adrenals/Urinary Tract: Adrenal glands are unremarkable. Kidneys are normal, without renal calculi, focal lesion, or hydronephrosis. Bladder is unremarkable. Stomach/Bowel: Stomach is within normal limits. Appendix appears normal. No evidence of bowel wall thickening,  distention, or inflammatory changes. Vascular/Lymphatic: No significant vascular findings are present. No enlarged abdominal or pelvic lymph nodes. Reproductive: Status post hysterectomy. No adnexal masses. Other: No abdominal wall hernia or abnormality. No abdominopelvic ascites. Musculoskeletal: No acute or significant osseous findings. IMPRESSION: 1. No acute or active process within the abdomen or pelvis. 2. Evidence of prior cholecystectomy and hysterectomy. Electronically Signed   By: Virgle Grime M.D.   On: 11/18/2023 21:50   DG Toe Great Left Result Date: 11/18/2023 CLINICAL DATA:  Pain after fall, injuring great toe. EXAM: LEFT GREAT TOE COMPARISON:  None Available. FINDINGS: Possible transverse nondisplaced fracture of the great toe distal phalanx. There is chronic irregularity of the distal tuft. Normal alignment without dislocation. Soft tissue edema about the digit. IMPRESSION: Possible transverse nondisplaced  fracture of the great toe distal phalanx. Chronic irregularity of the distal tuft. Electronically Signed   By: Chadwick Colonel M.D.   On: 11/18/2023 18:23    Assessment and Plan: 64F h/o HTN, and cannabinoid hyperemesis syndrome, and prior cholecystectomy and hysterectomy presented to ED with complaints of nausea, vomiting, and generalized abdominal pain iso AKI.  #AKI 2/2 intractable n/v Possible viral gastroenteritis vs cannabinoid hyperememesis syndrome -Continue MIVF: LR at 125cc/h for 24h -Daily BMP -Strict I&Os and daily weights (standing preferred) -Renally dose medications for CrCl -Avoid lovenox , NSAIDs, morphine , Fleet's phosphate enema, regular insulin, contrast; no gadolinium for MRI to avoid nephrogenic systemic fibrosis -Consider renal US  and nephrology consult if worsening AKI or lack of improvement -F/u urine culture and treat accordingly  #L great toe fracture -Continue L foot orthotic (flat walking shoe in place) and consider OP orthopedic surgery eval at d/c -Tylenol  1g QID sch and IV dilaudid  0.5mg  q4h prn   Advance Care Planning:   Code Status: Full Code   Consults: N/A  Family Communication: N/A  Severity of Illness: The appropriate patient status for this patient is INPATIENT. Inpatient status is judged to be reasonable and necessary in order to provide the required intensity of service to ensure the patient's safety. The patient's presenting symptoms, physical exam findings, and initial radiographic and laboratory data in the context of their chronic comorbidities is felt to place them at high risk for further clinical deterioration. Furthermore, it is not anticipated that the patient will be medically stable for discharge from the hospital within 2 midnights of admission.   * I certify that at the point of admission it is my clinical judgment that the patient will require inpatient hospital care spanning beyond 2 midnights from the point of admission due to high  intensity of service, high risk for further deterioration and high frequency of surveillance required.*   ------- I spent 55 minutes reviewing previous labs/notes, obtaining separate history at the bedside, counseling/discussing the treatment plan outlined above, ordering medications/tests, and performing clinical documentation.  Author: Arne Langdon, MD 11/19/2023 1:48 PM  For on call review [email or web address].

## 2023-11-20 DIAGNOSIS — N179 Acute kidney failure, unspecified: Secondary | ICD-10-CM | POA: Diagnosis not present

## 2023-11-20 LAB — COMPREHENSIVE METABOLIC PANEL WITH GFR
ALT: 12 U/L (ref 0–44)
AST: 24 U/L (ref 15–41)
Albumin: 3.3 g/dL — ABNORMAL LOW (ref 3.5–5.0)
Alkaline Phosphatase: 47 U/L (ref 38–126)
Anion gap: 12 (ref 5–15)
BUN: 14 mg/dL (ref 6–20)
CO2: 23 mmol/L (ref 22–32)
Calcium: 8.6 mg/dL — ABNORMAL LOW (ref 8.9–10.3)
Chloride: 101 mmol/L (ref 98–111)
Creatinine, Ser: 0.93 mg/dL (ref 0.44–1.00)
GFR, Estimated: >60 mL/min (ref >60)
Glucose, Bld: 113 mg/dL — ABNORMAL HIGH (ref 70–99)
Potassium: 3.5 mmol/L (ref 3.5–5.1)
Sodium: 136 mmol/L (ref 135–145)
Total Bilirubin: 1.1 mg/dL (ref 0.0–1.2)
Total Protein: 6.3 g/dL — ABNORMAL LOW (ref 6.5–8.1)

## 2023-11-20 LAB — CBC WITH DIFFERENTIAL/PLATELET
Abs Immature Granulocytes: 0.07 10*3/uL (ref 0.00–0.07)
Basophils Absolute: 0 10*3/uL (ref 0.0–0.1)
Basophils Relative: 0 %
Eosinophils Absolute: 0 10*3/uL (ref 0.0–0.5)
Eosinophils Relative: 0 %
HCT: 35.1 % — ABNORMAL LOW (ref 36.0–46.0)
Hemoglobin: 11.9 g/dL — ABNORMAL LOW (ref 12.0–15.0)
Immature Granulocytes: 1 %
Lymphocytes Relative: 13 %
Lymphs Abs: 1.4 10*3/uL (ref 0.7–4.0)
MCH: 31.7 pg (ref 26.0–34.0)
MCHC: 33.9 g/dL (ref 30.0–36.0)
MCV: 93.6 fL (ref 80.0–100.0)
Monocytes Absolute: 1.2 10*3/uL — ABNORMAL HIGH (ref 0.1–1.0)
Monocytes Relative: 11 %
Neutro Abs: 8.1 10*3/uL — ABNORMAL HIGH (ref 1.7–7.7)
Neutrophils Relative %: 75 %
Platelets: 195 10*3/uL (ref 150–400)
RBC: 3.75 MIL/uL — ABNORMAL LOW (ref 3.87–5.11)
RDW: 12.2 % (ref 11.5–15.5)
WBC: 10.7 10*3/uL — ABNORMAL HIGH (ref 4.0–10.5)
nRBC: 0 % (ref 0.0–0.2)

## 2023-11-20 NOTE — Progress Notes (Addendum)
 Discharge instructions reviewed with pt.  Copy of instructions given to pt. No changes in medications, no new scripts. Pt asking for a note for her work, reached out to MD to provide this. Pt getting dressed at this time.  Pt will be d/c'd via wheelchair with belongings and will be escorted by staff to the discharge lounge where she will wait for her ride that she has already called.    Kelan Pritt,RN SWOT   [id number] --Waiting for MD to provide work note for pt.

## 2023-11-20 NOTE — Plan of Care (Signed)
  Problem: Education: Goal: Knowledge of General Education information will improve Description: Including pain rating scale, medication(s)/side effects and non-pharmacologic comfort measures Outcome: Progressing   Problem: Clinical Measurements: Goal: Respiratory complications will improve Outcome: Progressing Goal: Cardiovascular complication will be avoided Outcome: Progressing   Problem: Activity: Goal: Risk for activity intolerance will decrease Outcome: Progressing   Problem: Elimination: Goal: Will not experience complications related to urinary retention Outcome: Progressing   Problem: Clinical Measurements: Goal: Ability to maintain clinical measurements within normal limits will improve Outcome: Not Progressing   Problem: Nutrition: Goal: Adequate nutrition will be maintained Outcome: Not Progressing   Problem: Pain Managment: Goal: General experience of comfort will improve and/or be controlled Outcome: Not Progressing

## 2023-11-20 NOTE — Discharge Summary (Signed)
 Physician Discharge Summary  Sherry Key VHQ:[id number] DOB: 14-Apr-1986 DOA: 11/18/2023  PCP: Vicente Graham, No  Admit date: 11/18/2023 Discharge date: 11/20/2023 30 Day Unplanned Readmission Risk Score    Flowsheet Row ED to Hosp-Admission (Current) from 11/18/2023 in [facility] [facility]  30 Day Unplanned Readmission Risk Score (%) 26.29 Filed at 11/20/2023 1200       This score is the patient's risk of an unplanned readmission within 30 days of being discharged (0 -100%). The score is based on dignosis, age, lab data, medications, orders, and past utilization.   Low:  0-14.9   Medium: 15-21.9   High: 22-29.9   Extreme: 30 and above          Admitted From: Home Disposition: Home  Recommendations for Outpatient Follow-up:  Follow up with PCP in 1-2 weeks Please obtain BMP/CBC in one week Please follow up with your PCP on the following pending results: Unresulted Labs (From admission, onward)    None         Home Health: None Equipment/Devices: None  Discharge Condition: Stable CODE STATUS: Full code Diet recommendation: Regular  Following HPI and ED course is copied from admitting hospitalist H&P. HPI: Sherry Key is a 38 y.o. female with medical history significant of HTN, and cannabinoid hyperemesis syndrome, and prior cholecystectomy and hysterectomy presented to ED with complaints of nausea, vomiting, and generalized abdominal pain iso AKI.   Pt states that she was in her USOH until this past week when she started having increased n/v. She initially attributed her n/v to being sick, but when it did not resolve she became concerned enough to come to the ED. She states that her intractable n/v was so severe that she has not been able to tolerate anything by mouth for the past week except water, even Gatorade "came back up." Denies any recent sick contacts. Endorses lethargy and dizziness with said n/v. Of note, pt states that she hit her L great toe on the  bathroom threshold while trying to make it to the restroom quickly, and believes she may have broken her toe because the "toenail is hanging off."   In the ED, pt noted to have labs notable for Cr >6, lactate >2, WBC >22 and high suspicion for dehydration. Blood and urine cultures pending. Labs improving with hydration. X-ray of left great toe showing possible transverse nondisplaced fracture of the great toe distal phalanx. Patient was given IV Dilaudid , Phenergan , and 2 L IV fluids. Pt admitted to medicine for ongoing care.  Subjective: Patient seen and examined, she has no more nausea or vomiting, she has had only 1 watery bowel movement this morning and last 24 hours and she has already tolerated breakfast and lunch and she feels comfortable going home.  Brief/Interim Summary: Patient was admitted due to intractable nausea and vomiting, abdominal pain and subjective diarrhea which led to the dehydration which led to the AKI with creatinine of 6 upon arrival.  Her symptoms were presumed to be secondary to cannabinoid hyperemesis syndrome or viral gastroenteritis.  Patient was treated conservatively with IV fluids and symptomatic treatment.  Patient improved significantly, she has no more symptoms, tolerated breakfast and lunch with a regular diet and her creatinine is back to normal now after receiving IV hydration.  Patient is being discharged home in stable condition.  L great toe fracture -Continue L foot orthotic (flat walking shoe in place) and recommend follow-up with orthopedic as outpatient.  Take Tylenol  or ibuprofen  over-the-counter for  pain control.  Discharge plan was discussed with patient and/or family member and they verbalized understanding and agreed with it.  Discharge Diagnoses:  Principal Problem:   AKI (acute kidney injury) (HCC) Active Problems:   Cannabis hyperemesis syndrome concurrent with and due to cannabis dependence (HCC)   Abdominal pain with vomiting   Nausea &  vomiting   Dehydration   Drug-seeking behavior - pt seeking opiates specifically dilaudid  and IV phenergan .    Discharge Instructions   Allergies as of 11/20/2023       Reactions   Zofran  [ondansetron  Hcl] Hives, Shortness Of Breath   Droperidol  Other (See Comments)   Tongue spasms, drooling, dystonia "Tongue spasm" starts to stick out her tongue and wave it side-to-side.  Maintains airway, oxygenation okay, no respiratory distress no other symptoms.  Consider dystonic reaction vs volitional reaction.   Ketorolac  Other (See Comments)   Tongue spasms, drooling, dystonia   Peanut-containing Drug Products Hives, Itching   Tomato Hives, Itching   Dicyclomine  Other (See Comments)   Patient denies allergy   Morphine  Other (See Comments)   Patient denies allergy   Oxycodone-acetaminophen  Other (See Comments)   Patient denies allergy   Zofran  Hives, Itching   Haloperidol  Rash   Other Itching   Fuzzy fruit        Medication List     TAKE these medications    acetaminophen  500 MG tablet Commonly known as: TYLENOL  Take 500 mg by mouth every 6 (six) hours as needed for moderate pain or mild pain.   CertaVite/Antioxidants Tabs Take 1 tablet by mouth daily.   diphenhydrAMINE  25 MG tablet Commonly known as: BENADRYL  Take 25 mg by mouth every 6 (six) hours as needed for allergies.   promethazine  25 MG suppository Commonly known as: PHENERGAN  Place 25 mg rectally every 6 (six) hours as needed for nausea or vomiting.   topiramate  25 MG tablet Commonly known as: Topamax  Take 1 tablet (25 mg total) by mouth at bedtime. May increase to 50mg  after 7 days What changed: additional instructions        Follow-up Information     PCP Follow up in 1 week(s).                 Allergies  Allergen Reactions   Zofran  [Ondansetron  Hcl] Hives and Shortness Of Breath   Droperidol  Other (See Comments)    Tongue spasms, drooling, dystonia  "Tongue spasm" starts to stick out  her tongue and wave it side-to-side.  Maintains airway, oxygenation okay, no respiratory distress no other symptoms.  Consider dystonic reaction vs volitional reaction.   Ketorolac  Other (See Comments)    Tongue spasms, drooling, dystonia   Peanut-Containing Drug Products Hives and Itching   Tomato Hives and Itching   Dicyclomine  Other (See Comments)    Patient denies allergy   Morphine  Other (See Comments)    Patient denies allergy    Oxycodone-Acetaminophen  Other (See Comments)    Patient denies allergy    Zofran  Hives and Itching   Haloperidol  Rash   Other Itching    Fuzzy fruit    Consultations: None   Procedures/Studies: CT ABDOMEN PELVIS WO CONTRAST Result Date: 11/18/2023 CLINICAL DATA:  Abdominal pain with nausea and vomiting. EXAM: CT ABDOMEN AND PELVIS WITHOUT CONTRAST TECHNIQUE: Multidetector CT imaging of the abdomen and pelvis was performed following the standard protocol without IV contrast. RADIATION DOSE REDUCTION: This exam was performed according to the departmental dose-optimization program which includes automated exposure control, adjustment of the  mA and/or kV according to patient size and/or use of iterative reconstruction technique. COMPARISON:  September 22, 2023 FINDINGS: Lower chest: No acute abnormality. Hepatobiliary: No focal liver abnormality is seen. Status post cholecystectomy. No biliary dilatation. Pancreas: Unremarkable. No pancreatic ductal dilatation or surrounding inflammatory changes. The small cystic lesion seen within the tail of the pancreas on the prior study is not clearly identified. Spleen: Normal in size without focal abnormality. Adrenals/Urinary Tract: Adrenal glands are unremarkable. Kidneys are normal, without renal calculi, focal lesion, or hydronephrosis. Bladder is unremarkable. Stomach/Bowel: Stomach is within normal limits. Appendix appears normal. No evidence of bowel wall thickening, distention, or inflammatory changes.  Vascular/Lymphatic: No significant vascular findings are present. No enlarged abdominal or pelvic lymph nodes. Reproductive: Status post hysterectomy. No adnexal masses. Other: No abdominal wall hernia or abnormality. No abdominopelvic ascites. Musculoskeletal: No acute or significant osseous findings. IMPRESSION: 1. No acute or active process within the abdomen or pelvis. 2. Evidence of prior cholecystectomy and hysterectomy. Electronically Signed   By: Virgle Grime M.D.   On: 11/18/2023 21:50   DG Toe Great Left Result Date: 11/18/2023 CLINICAL DATA:  Pain after fall, injuring great toe. EXAM: LEFT GREAT TOE COMPARISON:  None Available. FINDINGS: Possible transverse nondisplaced fracture of the great toe distal phalanx. There is chronic irregularity of the distal tuft. Normal alignment without dislocation. Soft tissue edema about the digit. IMPRESSION: Possible transverse nondisplaced fracture of the great toe distal phalanx. Chronic irregularity of the distal tuft. Electronically Signed   By: Chadwick Colonel M.D.   On: 11/18/2023 18:23     Discharge Exam: Vitals:   11/20/23 0927 11/20/23 1256  BP: 132/88 (!) 126/91  Pulse: 93 91  Resp: 17 18  Temp: 98.4 F (36.9 C) 97.9 F (36.6 C)  SpO2: 98% 99%   Vitals:   11/19/23 1952 11/19/23 2334 11/20/23 0927 11/20/23 1256  BP: 131/88 133/84 132/88 (!) 126/91  Pulse: 100 93 93 91  Resp: 17 15 17 18   Temp: 98.2 F (36.8 C) 98.5 F (36.9 C) 98.4 F (36.9 C) 97.9 F (36.6 C)  TempSrc: Oral Oral Oral Oral  SpO2: 98%  98% 99%  Weight:      Height:        General: Pt is alert, awake, not in acute distress Cardiovascular: RRR, S1/S2 +, no rubs, no gallops Respiratory: CTA bilaterally, no wheezing, no rhonchi Abdominal: Soft, NT, ND, bowel sounds + Extremities: no edema, no cyanosis    The results of significant diagnostics from this hospitalization (including imaging, microbiology, ancillary and laboratory) are listed below for  reference.     Microbiology: Recent Results (from the past 240 hours)  Culture, blood (routine x 2)     Status: None (Preliminary result)   Collection Time: 11/18/23  9:19 PM   Specimen: BLOOD  Result Value Ref Range Status   Specimen Description   Final    BLOOD RIGHT ANTECUBITAL Performed at [facility], [address] [place], [place] [address]    Special Requests   Final    BOTTLES DRAWN AEROBIC AND ANAEROBIC Blood Culture adequate volume Performed at [facility], [address] [place], [place] [address]    Culture   Final    NO GROWTH 1 DAY Performed at [facility], [address], [place], [place] [address]    Report Status PENDING  Incomplete  Culture, blood (routine x 2)     Status: None (Preliminary result)  Collection Time: 11/19/23  1:57 PM   Specimen: BLOOD  Result Value Ref Range Status   Specimen Description BLOOD SITE NOT SPECIFIED  Final   Special Requests   Final    BOTTLES DRAWN AEROBIC ONLY Blood Culture results may not be optimal due to an inadequate volume of blood received in culture bottles   Culture   Final    NO GROWTH < 24 HOURS Performed at [facility], [address], [place], [place] [address]    Report Status PENDING  Incomplete     Labs: BNP (last 3 results) No results for input(s): "BNP" in the last 8760 hours. Basic Metabolic Panel: Recent Labs  Lab 11/18/23 1850 11/18/23 2041 11/18/23 2344 11/19/23 0902 11/19/23 1354 11/20/23 0939  NA 129* 128* 133* 133*  --  136  K 4.1 4.0 3.8 3.8  --  3.5  CL 79*  --  90* 97*  --  101  CO2 14*  --  18* 26  --  23  GLUCOSE 111*  --  102* 124*  --  113*  BUN 76*  --  72* 55*  --  14  CREATININE 6.62*  --  4.84* 2.12* 1.58* 0.93  CALCIUM 10.4*  --  8.7* 9.1  --  8.6*   Liver Function Tests: Recent Labs  Lab 11/18/23 1850 11/20/23 0939  AST 17 24  ALT 12 12  ALKPHOS 101 47  BILITOT 0.8 1.1  PROT 10.0* 6.3*  ALBUMIN 5.4* 3.3*   Recent Labs   Lab 11/18/23 1850  LIPASE 25   No results for input(s): "AMMONIA" in the last 168 hours. CBC: Recent Labs  Lab 11/18/23 1850 11/18/23 2041 11/19/23 0902 11/19/23 1354 11/20/23 0939  WBC 22.3*  --  17.9* 15.8* 10.7*  NEUTROABS 19.8*  --  14.5*  --  8.1*  HGB 16.6* 15.0 12.7 13.5 11.9*  HCT 46.2* 44.0 35.7* 38.7 35.1*  MCV 88.0  --  89.7 90.6 93.6  PLT 339  --  254 236 195   Cardiac Enzymes: No results for input(s): "CKTOTAL", "CKMB", "CKMBINDEX", "TROPONINI" in the last 168 hours. BNP: Invalid input(s): "POCBNP" CBG: No results for input(s): "GLUCAP" in the last 168 hours. D-Dimer No results for input(s): "DDIMER" in the last 72 hours. Hgb A1c No results for input(s): "HGBA1C" in the last 72 hours. Lipid Profile No results for input(s): "CHOL", "HDL", "LDLCALC", "TRIG", "CHOLHDL", "LDLDIRECT" in the last 72 hours. Thyroid  function studies No results for input(s): "TSH", "T4TOTAL", "T3FREE", "THYROIDAB" in the last 72 hours.  Invalid input(s): "FREET3" Anemia work up No results for input(s): "VITAMINB12", "FOLATE", "FERRITIN", "TIBC", "IRON", "RETICCTPCT" in the last 72 hours. Urinalysis    Component Value Date/Time   COLORURINE YELLOW 11/18/2023 1629   APPEARANCEUR HAZY (A) 11/18/2023 1629   LABSPEC >=1.030 11/18/2023 1629   PHURINE 5.0 11/18/2023 1629   GLUCOSEU NEGATIVE 11/18/2023 1629   HGBUR TRACE (A) 11/18/2023 1629   BILIRUBINUR LARGE (A) 11/18/2023 1629   KETONESUR NEGATIVE 11/18/2023 1629   PROTEINUR 100 (A) 11/18/2023 1629   UROBILINOGEN 0.2 01/18/2015 1324   NITRITE NEGATIVE 11/18/2023 1629   LEUKOCYTESUR NEGATIVE 11/18/2023 1629   Sepsis Labs Recent Labs  Lab 11/18/23 1850 11/19/23 0902 11/19/23 1354 11/20/23 0939  WBC 22.3* 17.9* 15.8* 10.7*   Microbiology Recent Results (from the past 240 hours)  Culture, blood (routine x 2)     Status: None (Preliminary result)   Collection Time: 11/18/23  9:19 PM   Specimen: BLOOD  Result Value Ref  Range Status   Specimen Description   Final    BLOOD RIGHT ANTECUBITAL Performed at [facility], [address] [place], [place] [address]    Special Requests   Final    BOTTLES DRAWN AEROBIC AND ANAEROBIC Blood Culture adequate volume Performed at [facility], [address] [place], [place] [address]    Culture   Final    NO GROWTH 1 DAY Performed at [facility], [address] [place], [place] [address]    Report Status PENDING  Incomplete  Culture, blood (routine x 2)     Status: None (Preliminary result)   Collection Time: 11/19/23  1:57 PM   Specimen: BLOOD  Result Value Ref Range Status   Specimen Description BLOOD SITE NOT SPECIFIED  Final   Special Requests   Final    BOTTLES DRAWN AEROBIC ONLY Blood Culture results may not be optimal due to an inadequate volume of blood received in culture bottles   Culture   Final    NO GROWTH < 24 HOURS Performed at [facility], [address] [place], [place] [address]    Report Status PENDING  Incomplete    FURTHER DISCHARGE INSTRUCTIONS:   Get Medicines reviewed and adjusted: Please take all your medications with you for your next visit with your Primary MD   Laboratory/radiological data: Please request your Primary MD to go over all hospital tests and procedure/radiological results at the follow up, please ask your Primary MD to get all Hospital records sent to his/her office.   In some cases, they will be blood work, cultures and biopsy results pending at the time of your discharge. Please request that your primary care M.D. goes through all the records of your hospital data and follows up on these results.   Also Note the following: If you experience worsening of your admission symptoms, develop shortness of breath, life threatening emergency, suicidal or homicidal thoughts you must seek medical attention immediately by calling 911 or calling your MD immediately  if symptoms less  severe.   You must read complete instructions/literature along with all the possible adverse reactions/side effects for all the Medicines you take and that have been prescribed to you. Take any new Medicines after you have completely understood and accpet all the possible adverse reactions/side effects.    Do not drive when taking Pain medications or sleeping medications (Benzodaizepines)   Do not take more than prescribed Pain, Sleep and Anxiety Medications. It is not advisable to combine anxiety,sleep and pain medications without talking with your primary care practitioner   Special Instructions: If you have smoked or chewed Tobacco  in the last 2 yrs please stop smoking, stop any regular Alcohol  and or any Recreational drug use.   Wear Seat belts while driving.   Please note: You were cared for by a hospitalist during your hospital stay. Once you are discharged, your primary care physician will handle any further medical issues. Please note that NO REFILLS for any discharge medications will be authorized once you are discharged, as it is imperative that you return to your primary care physician (or establish a relationship with a primary care physician if you do not have one) for your post hospital discharge needs so that they can reassess your need for medications and monitor your lab values  Time coordinating discharge: Over 30 minutes  SIGNED:   Modena Andes, MD  [facility] 11/20/2023,  1:52 PM *Please note that this is a verbal dictation therefore any spelling or grammatical errors are due to the "Dragon Medical One" system interpretation. If 7PM-7AM, please contact night-coverage [email or web address]

## 2023-11-20 NOTE — Plan of Care (Signed)

## 2023-11-20 NOTE — Plan of Care (Signed)
   Problem: Coping: Goal: Level of anxiety will decrease Outcome: Progressing

## 2023-11-21 ENCOUNTER — Ambulatory Visit (HOSPITAL_BASED_OUTPATIENT_CLINIC_OR_DEPARTMENT_OTHER): Payer: MEDICAID

## 2023-11-21 ENCOUNTER — Encounter (HOSPITAL_BASED_OUTPATIENT_CLINIC_OR_DEPARTMENT_OTHER): Payer: Self-pay | Admitting: Student

## 2023-11-21 ENCOUNTER — Ambulatory Visit (HOSPITAL_BASED_OUTPATIENT_CLINIC_OR_DEPARTMENT_OTHER): Payer: MEDICAID | Admitting: Student

## 2023-11-21 DIAGNOSIS — M25562 Pain in left knee: Secondary | ICD-10-CM

## 2023-11-21 DIAGNOSIS — S92425A Nondisplaced fracture of distal phalanx of left great toe, initial encounter for closed fracture: Secondary | ICD-10-CM | POA: Diagnosis not present

## 2023-11-21 NOTE — Progress Notes (Signed)
 Chief Complaint: Left knee pain and left great toe fracture    Discussed the use of AI scribe software for clinical note transcription with the patient, who gave verbal consent to proceed.  History of Present Illness Sherry Key is a 38 year old female who presents with a left big toe fracture and knee pain following a fall.  She experienced a fall on Thursday, resulting in a left big toe fracture. The toe is painful, especially when weight is applied, and pain is alleviated by elevation. The toenail is lifted but not detached, causing additional pain. She covers it with a bandage to prevent snagging.  She has been in a surgical shoe, but states that the toe is still significantly painful to weight-bear on without assistance. Pain radiates from her foot to her knee, intensifying during ambulation and accompanied by a throbbing sensation.  Pain in the knee is located mainly medially.  Denies any locking, popping, or buckling.  Numbness and tingling are present in the affected foot. She takes Tylenol  for pain management.   Surgical History:   None  PMH/PSH/Family History/Social History/Meds/Allergies:    Past Medical History:  Diagnosis Date   Cannabinoid hyperemesis syndrome    Crohn's disease (HCC)    Cyclical vomiting    Drug-seeking behavior - pt seeking opiates specifically dilaudid  and IV phenergan . 09/24/2023   Endometriosis    Fibroids    History of partial hysterectomy 10/24/2019   Hypertension    PCOS (polycystic ovarian syndrome)    Past Surgical History:  Procedure Laterality Date   ABDOMINAL HYSTERECTOMY     CESAREAN SECTION     CESAREAN SECTION     CHOLECYSTECTOMY     HERNIA REPAIR     TONSILLECTOMY     TUBAL LIGATION     TUBAL LIGATION     Social History   Socioeconomic History   Marital status: Single    Spouse name: Not on file   Number of children: Not on file   Years of education: Not on file   Highest  education level: Not on file  Occupational History   Not on file  Tobacco Use   Smoking status: Former    Current packs/day: 0.50    Types: Cigarettes   Smokeless tobacco: Never  Vaping Use   Vaping status: Never Used  Substance and Sexual Activity   Alcohol use: No   Drug use: Yes    Types: Marijuana   Sexual activity: Not on file  Other Topics Concern   Not on file  Social History Narrative   ** Merged History Encounter **       Social Drivers of Health   Financial Resource Strain: Not on file  Food Insecurity: Food Insecurity Present (11/19/2023)   Hunger Vital Sign    Worried About Running Out of Food in the Last Year: Sometimes true    Ran Out of Food in the Last Year: Sometimes true  Transportation Needs: No Transportation Needs (11/19/2023)   PRAPARE - Administrator, Civil Service (Medical): No    Lack of Transportation (Non-Medical): No  Physical Activity: Not on file  Stress: Not on file  Social Connections: Unknown (11/15/2021)   Received from [facility]   Social Network    Social Network: Not on file  History reviewed. No pertinent family history. Allergies  Allergen Reactions   Zofran  [Ondansetron  Hcl] Hives and Shortness Of Breath   Droperidol  Other (See Comments)    Tongue spasms, drooling, dystonia  "Tongue spasm" starts to stick out her tongue and wave it side-to-side.  Maintains airway, oxygenation okay, no respiratory distress no other symptoms.  Consider dystonic reaction vs volitional reaction.   Ketorolac  Other (See Comments)    Tongue spasms, drooling, dystonia   Peanut-Containing Drug Products Hives and Itching   Tomato Hives and Itching   Dicyclomine  Other (See Comments)    Patient denies allergy   Morphine  Other (See Comments)    Patient denies allergy    Oxycodone-Acetaminophen  Other (See Comments)    Patient denies allergy    Zofran  Hives and Itching   Haloperidol  Rash   Other Itching    Fuzzy fruit    Current Outpatient Medications  Medication Sig Dispense Refill   acetaminophen  (TYLENOL ) 500 MG tablet Take 500 mg by mouth every 6 (six) hours as needed for moderate pain or mild pain.     diphenhydrAMINE  (BENADRYL ) 25 MG tablet Take 25 mg by mouth every 6 (six) hours as needed for allergies.     Multiple Vitamins-Minerals (CERTAVITE/ANTIOXIDANTS) TABS Take 1 tablet by mouth daily. 30 tablet 0   promethazine  (PHENERGAN ) 25 MG suppository Place 25 mg rectally every 6 (six) hours as needed for nausea or vomiting.     topiramate  (TOPAMAX ) 25 MG tablet Take 1 tablet (25 mg total) by mouth at bedtime. May increase to 50mg  after 7 days (Patient taking differently: Take 25 mg by mouth at bedtime.) 60 tablet 2   No current facility-administered medications for this visit.   No results found.  Review of Systems:   A ROS was performed including pertinent positives and negatives as documented in the HPI.  Physical Exam :   Constitutional: NAD and appears stated age Neurological: Alert and oriented Psych: Appropriate affect and cooperative Last menstrual period 10/19/2019.   Comprehensive Musculoskeletal Exam:    Left knee exam demonstrates tenderness over the medial joint line and MCL distribution.  Active range of motion from 0 to 100 degrees without crepitus.  Medial pain is noted with valgus stress, although no laxity or gapping is appreciated.  Mild swelling without overlying erythema or warmth.  Negative Lachman.  Imaging:   Xray (left knee 4 views, left tib-fib 2 views): Negative for acute fracture, dislocation, or bony abnormality.  Joint spaces are well-maintained.   I personally reviewed and interpreted the radiographs.      Assessment & Plan Fracture of left great toe   An acute fracture is confirmed by x-ray, causing pain and difficulty with weight-bearing.  Recommend continuing weightbearing in the surgical shoe as tolerated and she can also buddy tape this as needed.   Fracture overall appears nondisplaced and will heal well with conservative management.  Given current difficulty with ambulation, I will provide crutches for added support.  Follow-up in 4 weeks for repeat x-ray.   Knee sprain   She continues to experience medial sided left knee pain due to the fall.  X-rays demonstrate no bony injury.  She does have pain over the MCL which is slightly exacerbated with valgus stress.  No laxity within the MCL is appreciated, so this is likely consistent with a low-grade sprain.  Will provide brace today for added stability.  Continue ice and Tylenol  with weightbearing as tolerated.      I personally saw and evaluated  the patient, and participated in the management and treatment plan.  Sharrell Deck, PA-C Orthopedics

## 2023-11-22 ENCOUNTER — Emergency Department (HOSPITAL_BASED_OUTPATIENT_CLINIC_OR_DEPARTMENT_OTHER)
Admission: EM | Admit: 2023-11-22 | Discharge: 2023-11-22 | Disposition: A | Discharge status: Home / Self Care | Payer: MEDICAID | Attending: Emergency Medicine | Admitting: Emergency Medicine

## 2023-11-22 ENCOUNTER — Encounter (HOSPITAL_BASED_OUTPATIENT_CLINIC_OR_DEPARTMENT_OTHER): Payer: Self-pay | Admitting: Emergency Medicine

## 2023-11-22 ENCOUNTER — Other Ambulatory Visit: Payer: Self-pay

## 2023-11-22 DIAGNOSIS — R112 Nausea with vomiting, unspecified: Secondary | ICD-10-CM | POA: Diagnosis present

## 2023-11-22 DIAGNOSIS — R1115 Cyclical vomiting syndrome unrelated to migraine: Secondary | ICD-10-CM | POA: Insufficient documentation

## 2023-11-22 DIAGNOSIS — Z91010 Allergy to peanuts: Secondary | ICD-10-CM | POA: Diagnosis not present

## 2023-11-22 LAB — CBC
HCT: 37.7 % (ref 36.0–46.0)
Hemoglobin: 12.5 g/dL (ref 12.0–15.0)
MCH: 31.5 pg (ref 26.0–34.0)
MCHC: 33.2 g/dL (ref 30.0–36.0)
MCV: 95 fL (ref 80.0–100.0)
Platelets: 225 10*3/uL (ref 150–400)
RBC: 3.97 MIL/uL (ref 3.87–5.11)
RDW: 12.2 % (ref 11.5–15.5)
WBC: 15.9 10*3/uL — ABNORMAL HIGH (ref 4.0–10.5)
nRBC: 0 % (ref 0.0–0.2)

## 2023-11-22 LAB — COMPREHENSIVE METABOLIC PANEL WITH GFR
ALT: 13 U/L (ref 0–44)
AST: 24 U/L (ref 15–41)
Albumin: 4.3 g/dL (ref 3.5–5.0)
Alkaline Phosphatase: 73 U/L (ref 38–126)
Anion gap: 14 (ref 5–15)
BUN: 8 mg/dL (ref 6–20)
CO2: 25 mmol/L (ref 22–32)
Calcium: 9.4 mg/dL (ref 8.9–10.3)
Chloride: 98 mmol/L (ref 98–111)
Creatinine, Ser: 0.87 mg/dL (ref 0.44–1.00)
GFR, Estimated: >60 mL/min (ref >60)
Glucose, Bld: 93 mg/dL (ref 70–99)
Potassium: 3.4 mmol/L — ABNORMAL LOW (ref 3.5–5.1)
Sodium: 137 mmol/L (ref 135–145)
Total Bilirubin: 0.7 mg/dL (ref 0.0–1.2)
Total Protein: 7.3 g/dL (ref 6.5–8.1)

## 2023-11-22 LAB — MAGNESIUM: Magnesium: 1.7 mg/dL (ref 1.7–2.4)

## 2023-11-22 LAB — LIPASE, BLOOD: Lipase: 25 U/L (ref 11–51)

## 2023-11-22 MED ORDER — DICYCLOMINE HCL 10 MG PO CAPS
10.0000 mg | ORAL_CAPSULE | Freq: Once | ORAL | Status: AC
  Administered 2023-11-22: 10 mg via ORAL
  Filled 2023-11-22: qty 1

## 2023-11-22 MED ORDER — FENTANYL CITRATE PF 50 MCG/ML IJ SOSY
50.0000 ug | PREFILLED_SYRINGE | Freq: Once | INTRAMUSCULAR | Status: AC
  Administered 2023-11-22: 50 ug via INTRAVENOUS
  Filled 2023-11-22: qty 1

## 2023-11-22 MED ORDER — SODIUM CHLORIDE 0.9 % IV BOLUS
1000.0000 mL | Freq: Once | INTRAVENOUS | Status: AC
  Administered 2023-11-22: 1000 mL via INTRAVENOUS

## 2023-11-22 MED ORDER — PROMETHAZINE HCL 25 MG/ML IJ SOLN
INTRAMUSCULAR | Status: AC
  Filled 2023-11-22: qty 1

## 2023-11-22 MED ORDER — DICYCLOMINE HCL 20 MG PO TABS: 20.0000 mg | ORAL_TABLET | Freq: Two times a day (BID) | ORAL | 0 refills | Status: DC

## 2023-11-22 MED ORDER — PROMETHAZINE HCL 25 MG PO TABS: 25.0000 mg | ORAL_TABLET | Freq: Four times a day (QID) | ORAL | 0 refills | Status: DC | PRN

## 2023-11-22 MED ORDER — SODIUM CHLORIDE 0.9 % IV SOLN
25.0000 mg | Freq: Four times a day (QID) | INTRAVENOUS | Status: DC | PRN
  Administered 2023-11-22: 25 mg via INTRAVENOUS
  Filled 2023-11-22: qty 1

## 2023-11-22 NOTE — ED Provider Notes (Signed)
 [place] EMERGENCY DEPARTMENT AT [facility] Provider Note   CSN: [id number] Arrival date & time: 11/22/23  1351     History Chief Complaint  Patient presents with   Emesis    Sherry Key is a 38 y.o. female patient with history of chronic abdominal pain and cannabinoid hyperemesis syndrome who presents to the emergency room today with chronic abdominal pain and nausea vomiting.  Patient was recently discharged in the hospital 2 days ago after being admitted for similar symptoms.  She states that upon discharge she was still having intractable vomiting but when asked if she want to go home she said yes and so she went home.  She states that when she got home vomiting continued she is unable to keep anything down.  Abdominal pain is still severe describes a cramping sensation.  It is diffuse in nature.  Patient also states he feels dehydrated.  She denies fever, chills, shortness of breath, urinary symptoms.   Emesis      Home Medications Prior to Admission medications   Medication Sig Start Date End Date Taking? Authorizing Provider  acetaminophen  (TYLENOL ) 500 MG tablet Take 500 mg by mouth every 6 (six) hours as needed for moderate pain or mild pain.    [provider]  diphenhydrAMINE  (BENADRYL ) 25 MG tablet Take 25 mg by mouth every 6 (six) hours as needed for allergies.    [provider]  Multiple Vitamins-Minerals (CERTAVITE/ANTIOXIDANTS) TABS Take 1 tablet by mouth daily. 01/20/23   Regalado, Belkys A, MD  promethazine  (PHENERGAN ) 25 MG suppository Place 25 mg rectally every 6 (six) hours as needed for nausea or vomiting.    [provider]  topiramate  (TOPAMAX ) 25 MG tablet Take 1 tablet (25 mg total) by mouth at bedtime. May increase to 50mg  after 7 days Patient taking differently: Take 25 mg by mouth at bedtime. 11/03/22 11/18/24  Suellyn Emory, MD      Allergies    Zofran  [ondansetron  hcl], Droperidol , Ketorolac ,  Peanut-containing drug products, Tomato, Dicyclomine , Morphine , Oxycodone-acetaminophen , Zofran , Haloperidol , and Other    Review of Systems   Review of Systems  Gastrointestinal:  Positive for vomiting.  All other systems reviewed and are negative.   Physical Exam Updated Vital Signs BP (!) 143/106   Pulse 89   Temp 98.1 F (36.7 C)   Resp 18   Wt 72 kg   LMP 10/19/2019   SpO2 100%   BMI 28.12 kg/m  Physical Exam Vitals and nursing note reviewed.  Constitutional:      General: She is not in acute distress.    Appearance: Normal appearance.  HENT:     Head: Normocephalic and atraumatic.  Eyes:     General:        Right eye: No discharge.        Left eye: No discharge.  Cardiovascular:     Comments: Tachycardic. S1/S2 are distinct without any evidence of murmur, rubs, or gallops.  Radial pulses are 2+ bilaterally.  Dorsalis pedis pulses are 2+ bilaterally.  No evidence of pedal edema. Pulmonary:     Comments: Clear to auscultation bilaterally.  Normal effort.  No respiratory distress.  No evidence of wheezes, rales, or rhonchi heard throughout. Abdominal:     General: Abdomen is flat. Bowel sounds are normal. There is no distension.     Tenderness: There is no abdominal tenderness. There is no guarding or rebound.  Musculoskeletal:        General: Normal range  of motion.     Cervical back: Neck supple.  Skin:    General: Skin is warm and dry.     Findings: No rash.  Neurological:     General: No focal deficit present.     Mental Status: She is alert.  Psychiatric:        Mood and Affect: Mood normal.        Behavior: Behavior normal.     ED Results / Procedures / Treatments   Labs (all labs ordered are listed, but only abnormal results are displayed) Labs Reviewed  COMPREHENSIVE METABOLIC PANEL WITH GFR - Abnormal; Notable for the following components:      Result Value   Potassium 3.4 (*)    All other components within normal limits  CBC - Abnormal;  Notable for the following components:   WBC 15.9 (*)    All other components within normal limits  LIPASE, BLOOD  MAGNESIUM  URINALYSIS, ROUTINE W REFLEX MICROSCOPIC  URINE DRUG SCREEN    EKG None  Radiology DG Tibia/Fibula Left Result Date: 11/22/2023 EXAM: XR Tibia and fibula VIEW(S) XRAY OF THE LEFT TIBIA AND FIBULA 11/21/2023 02:11:04 PM COMPARISON: None available. CLINICAL HISTORY: Pain. FINDINGS: BONES AND JOINTS: No acute fracture. No focal osseous lesion. No joint dislocation. SOFT TISSUES: The soft tissues are unremarkable. IMPRESSION: 1. No significant abnormality. Electronically signed by: Zadie Herter MD 11/22/2023 03:13 AM EDT RP Workstation: [id number]   DG Knee Complete 4 Views Left Result Date: 11/22/2023 EXAM: 4 OR MORE VIEW(S) XRAY OF THE LEFT KNEE 11/21/2023 02:11:04 PM COMPARISON: None available. CLINICAL HISTORY: Pain. FINDINGS: BONES AND JOINTS: No acute fracture. No focal osseous lesion. No joint dislocation. No significant joint effusion. No significant degenerative changes. SOFT TISSUES: The soft tissues are unremarkable. IMPRESSION: 1. No significant abnormality. Electronically signed by: Zadie Herter MD 11/22/2023 03:13 AM EDT RP Workstation: [id number]    Procedures Procedures    Medications Ordered in ED Medications  promethazine  (PHENERGAN ) 25 mg in sodium chloride  0.9 % 50 mL IVPB (0 mg Intravenous Stopped 11/22/23 1725)  promethazine  (PHENERGAN ) 25 MG/ML injection (  Not Given 11/22/23 1749)  sodium chloride  0.9 % bolus 1,000 mL (1,000 mLs Intravenous New Bag/Given 11/22/23 1704)  dicyclomine  (BENTYL ) capsule 10 mg (10 mg Oral Given 11/22/23 1709)  fentaNYL  (SUBLIMAZE ) injection 50 mcg (50 mcg Intravenous Given 11/22/23 1707)    ED Course/ Medical Decision Making/ A&P Clinical Course as of 11/22/23 1757  Thu Nov 22, 2023  1752 Hx of cannabanoid hyperemesis, cyclic vomiting, recently admitted for same. Reports still vomiting at time of discharge.  Still vomiting, Still having pain.  [CP]    Clinical Course User Index [CP] Prosperi, Cosimo Diones, PA-C   {   Click here for ABCD2, HEART and other calculators  Medical Decision Making Sherry Key is a 38 y.o. female patient presents to the emerged part today for further evaluation of chronic abdominal pain, nausea, and vomiting.  Will plan to repeat some labs as she did have acute renal failure upon her most recent admission.  This seems to be getting better based on chart review.  Her creatinine levels were downtrending.  Do not feel that imaging is warranted at this time.  I reviewed the most recent CT scan performed 4 days ago which was normal.  Will start off with some Bentyl , bolus of fluids, and Phenergan .  On repeat evaluation, patient is feeling much better.  Some of her workup is still pending.  Her labs  are greatly improved from her stay in the hospital.  I do feel the patient will likely be able to go home.  Due to shift change the rest of her care to be transferred to oncoming provider.  She should be prescribed Bentyl  and Phenergan  to go home with that she states that this greatly helped her.  Disposition to be made by oncoming provider.  Amount and/or Complexity of Data Reviewed Labs: ordered.  Risk Prescription drug management.    Final Clinical Impression(s) / ED Diagnoses Final diagnoses:  Cyclical vomiting syndrome not associated with migraine    Rx / DC Orders ED Discharge Orders     None         Darletta Ehrich, PA-C 11/22/23 1757    Nicklas Barns, MD 11/24/23 [phone]

## 2023-11-22 NOTE — Discharge Instructions (Addendum)
 Please take the medications that we prescribed as needed, follow-up closely with your primary care doctor, return to the emergency department if you have significant worsening of your symptoms despite treatment.

## 2023-11-22 NOTE — ED Provider Notes (Signed)
 Accepted handoff at shift change from [facility]. Please see prior provider note for more detail.   Briefly: Patient is 38 y.o.   DDX: concern for Hx of cannabanoid hyperemesis, cyclic vomiting, recently admitted for same. Reports still vomiting at time of discharge. Still vomiting, Still having pain.   Plan: Lab work significantly improved from recent previous, notably she had acute renal failure at time of previous admission, now totally resolved.  On reassessment after Bentyl , Phenergan , fluids she is feeling improved, will plan to discharge with those medications for home, encouraged close follow-up with PCP, return to the ED as needed for worsening symptoms.    Nelly Banco, PA-C 11/22/23 1803    Nicklas Barns, MD 11/24/23 [phone]

## 2023-11-22 NOTE — ED Triage Notes (Signed)
 Recurrent emesis , was admitted and DC 11/18/2023. Denies use of cannabis.

## 2023-11-24 LAB — CULTURE, BLOOD (ROUTINE X 2)
Culture: NO GROWTH
Culture: NO GROWTH
Special Requests: ADEQUATE

## 2023-12-19 ENCOUNTER — Ambulatory Visit (HOSPITAL_BASED_OUTPATIENT_CLINIC_OR_DEPARTMENT_OTHER): Payer: MEDICAID | Admitting: Student

## 2023-12-20 ENCOUNTER — Emergency Department (HOSPITAL_BASED_OUTPATIENT_CLINIC_OR_DEPARTMENT_OTHER)
Admission: EM | Admit: 2023-12-20 | Discharge: 2023-12-20 | Disposition: A | Discharge status: Home / Self Care | Payer: MEDICAID | Attending: Emergency Medicine | Admitting: Emergency Medicine

## 2023-12-20 ENCOUNTER — Ambulatory Visit (HOSPITAL_BASED_OUTPATIENT_CLINIC_OR_DEPARTMENT_OTHER): Payer: MEDICAID

## 2023-12-20 ENCOUNTER — Ambulatory Visit (HOSPITAL_BASED_OUTPATIENT_CLINIC_OR_DEPARTMENT_OTHER): Payer: MEDICAID | Admitting: Student

## 2023-12-20 ENCOUNTER — Encounter (HOSPITAL_BASED_OUTPATIENT_CLINIC_OR_DEPARTMENT_OTHER): Payer: Self-pay | Admitting: Student

## 2023-12-20 ENCOUNTER — Other Ambulatory Visit: Payer: Self-pay

## 2023-12-20 ENCOUNTER — Other Ambulatory Visit (HOSPITAL_BASED_OUTPATIENT_CLINIC_OR_DEPARTMENT_OTHER): Payer: Self-pay

## 2023-12-20 DIAGNOSIS — R1115 Cyclical vomiting syndrome unrelated to migraine: Secondary | ICD-10-CM | POA: Insufficient documentation

## 2023-12-20 DIAGNOSIS — M25562 Pain in left knee: Secondary | ICD-10-CM

## 2023-12-20 DIAGNOSIS — Z79899 Other long term (current) drug therapy: Secondary | ICD-10-CM | POA: Insufficient documentation

## 2023-12-20 DIAGNOSIS — Z91010 Allergy to peanuts: Secondary | ICD-10-CM | POA: Diagnosis not present

## 2023-12-20 DIAGNOSIS — S92425A Nondisplaced fracture of distal phalanx of left great toe, initial encounter for closed fracture: Secondary | ICD-10-CM

## 2023-12-20 DIAGNOSIS — Z760 Encounter for issue of repeat prescription: Secondary | ICD-10-CM | POA: Diagnosis not present

## 2023-12-20 DIAGNOSIS — I10 Essential (primary) hypertension: Secondary | ICD-10-CM | POA: Insufficient documentation

## 2023-12-20 DIAGNOSIS — K5090 Crohn's disease, unspecified, without complications: Secondary | ICD-10-CM | POA: Insufficient documentation

## 2023-12-20 MED ORDER — DICYCLOMINE HCL 20 MG PO TABS
20.0000 mg | ORAL_TABLET | Freq: Two times a day (BID) | ORAL | 1 refills | Status: DC
  Filled 2023-12-20: qty 30, 15d supply, fill #0
  Filled 2024-01-21: qty 30, 15d supply, fill #1

## 2023-12-20 MED ORDER — PROMETHAZINE HCL 25 MG PO TABS
25.0000 mg | ORAL_TABLET | Freq: Four times a day (QID) | ORAL | 0 refills | Status: DC | PRN
  Filled 2023-12-20: qty 30, 8d supply, fill #0

## 2023-12-20 NOTE — Progress Notes (Signed)
 Chief Complaint: Left knee pain and left great toe fracture    History of Present Illness  12/20/23: Patient presents to clinic today for 4-week follow-up of left knee pain and a fracture of the left great toe due to a fall.  Today she reports symptomatically doing much better.  Her knee pain has improved significantly and she has been able to discontinue the brace.  She only experiences occasional, brief knee pain with certain activities.  Pain in the great toe has significantly subsided as well, however she does express concern about her toenail stating that she feels like it is falling off.  She has transition back into normal shoes from the postop shoe and is tolerating this better.  Denies any other concerns at this time.   11/21/23: Sherry Key is a 38 year old female who presents with a left big toe fracture and knee pain following a fall. She experienced a fall on Thursday, resulting in a left big toe fracture. The toe is painful, especially when weight is applied, and pain is alleviated by elevation. The toenail is lifted but not detached, causing additional pain. She covers it with a bandage to prevent snagging.  She has been in a surgical shoe, but states that the toe is still significantly painful to weight-bear on without assistance. Pain radiates from her foot to her knee, intensifying during ambulation and accompanied by a throbbing sensation.  Pain in the knee is located mainly medially.  Denies any locking, popping, or buckling.  Numbness and tingling are present in the affected foot. She takes Tylenol  for pain management.   Surgical History:   None  PMH/PSH/Family History/Social History/Meds/Allergies:    Past Medical History:  Diagnosis Date   Cannabinoid hyperemesis syndrome    Crohn's disease (HCC)    Cyclical vomiting    Drug-seeking behavior - pt seeking opiates specifically dilaudid  and IV phenergan . 09/24/2023   Endometriosis     Fibroids    History of partial hysterectomy 10/24/2019   Hypertension    PCOS (polycystic ovarian syndrome)    Past Surgical History:  Procedure Laterality Date   ABDOMINAL HYSTERECTOMY     CESAREAN SECTION     CESAREAN SECTION     CHOLECYSTECTOMY     HERNIA REPAIR     TONSILLECTOMY     TUBAL LIGATION     TUBAL LIGATION     Social History   Socioeconomic History   Marital status: Single    Spouse name: Not on file   Number of children: Not on file   Years of education: Not on file   Highest education level: Not on file  Occupational History   Not on file  Tobacco Use   Smoking status: Former    Current packs/day: 0.50    Types: Cigarettes   Smokeless tobacco: Never  Vaping Use   Vaping status: Never Used  Substance and Sexual Activity   Alcohol use: No   Drug use: Yes    Types: Marijuana   Sexual activity: Not on file  Other Topics Concern   Not on file  Social History Narrative   ** Merged History Encounter **       Social Drivers of Health   Financial Resource Strain: Not on file  Food Insecurity: Food Insecurity Present (11/19/2023)   Hunger  Vital Sign    Worried About Programme researcher, broadcasting/film/video in the Last Year: Sometimes true    Ran Out of Food in the Last Year: Sometimes true  Transportation Needs: No Transportation Needs (11/19/2023)   PRAPARE - Administrator, Civil Service (Medical): No    Lack of Transportation (Non-Medical): No  Physical Activity: Not on file  Stress: Not on file  Social Connections: Unknown (11/15/2021)   Received from [facility]   Social Network    Social Network: Not on file   History reviewed. No pertinent family history. Allergies  Allergen Reactions   Zofran  [Ondansetron  Hcl] Hives and Shortness Of Breath   Droperidol  Other (See Comments)    Tongue spasms, drooling, dystonia  Tongue spasm starts to stick out her tongue and wave it side-to-side.  Maintains airway, oxygenation okay, no respiratory distress no  other symptoms.  Consider dystonic reaction vs volitional reaction.   Ketorolac  Other (See Comments)    Tongue spasms, drooling, dystonia   Peanut-Containing Drug Products Hives and Itching   Tomato Hives and Itching   Dicyclomine  Other (See Comments)    Patient denies allergy   Morphine  Other (See Comments)    Patient denies allergy    Oxycodone-Acetaminophen  Other (See Comments)    Patient denies allergy    Zofran  Hives and Itching   Haloperidol  Rash   Other Itching    Fuzzy fruit   Current Outpatient Medications  Medication Sig Dispense Refill   acetaminophen  (TYLENOL ) 500 MG tablet Take 500 mg by mouth every 6 (six) hours as needed for moderate pain or mild pain.     dicyclomine  (BENTYL ) 20 MG tablet Take 1 tablet (20 mg total) by mouth 2 (two) times daily. 20 tablet 0   diphenhydrAMINE  (BENADRYL ) 25 MG tablet Take 25 mg by mouth every 6 (six) hours as needed for allergies.     Multiple Vitamins-Minerals (CERTAVITE/ANTIOXIDANTS) TABS Take 1 tablet by mouth daily. 30 tablet 0   promethazine  (PHENERGAN ) 25 MG suppository Place 25 mg rectally every 6 (six) hours as needed for nausea or vomiting.     promethazine  (PHENERGAN ) 25 MG tablet Take 1 tablet (25 mg total) by mouth every 6 (six) hours as needed for nausea or vomiting. 30 tablet 0   topiramate  (TOPAMAX ) 25 MG tablet Take 1 tablet (25 mg total) by mouth at bedtime. May increase to 50mg  after 7 days (Patient taking differently: Take 25 mg by mouth at bedtime.) 60 tablet 2   No current facility-administered medications for this visit.   No results found.  Review of Systems:   A ROS was performed including pertinent positives and negatives as documented in the HPI.  Physical Exam :   Constitutional: NAD and appears stated age Neurological: Alert and oriented Psych: Appropriate affect and cooperative Last menstrual period 10/19/2019.   Comprehensive Musculoskeletal Exam:    No significant tenderness with palpation in the  left knee, which demonstrates full active range of motion from 0 to 130 degrees.  No pain or laxity with varus or valgus stress.  Flexion and extension strength is 5/5. No tenderness or visible deformity about the left great toe.  No visible separation of the nail from the nailbed.  Patient is able to actively flex and extend at the IP joint.  Distal neurosensory exam intact.  Imaging:   Xray (left great toe 3 views): Significantly decreased visualization of prior distal phalanx transverse fracture.   I personally reviewed and interpreted the radiographs.  Assessment & Plan Fracture of left great toe   Patient sustained a fracture to the distal phalanx of the left great toe, which on x-rays taken today looks much improved.  Symptomatically she is much better and has been able to transition back into normal shoes without difficulty.  She does express some concern about her toenail on feels like it may be separating, although I was not able to appreciate any significant separation.  Nail does appear damaged however so we will plan to refer to podiatry for further evaluation and management of this.  No need to repeat x-rays unless needed.   Knee sprain   Patient sustained a knee injury during the initial fall that was consistent with a low-grade MCL sprain.  Today she states that her knee pain has resolved and has been able to discontinue use of the brace.  No pain or signs of instability on exam.       I personally saw and evaluated the patient, and participated in the management and treatment plan.  Sharrell Deck, PA-C Orthopedics

## 2023-12-20 NOTE — ED Notes (Signed)
 Discharge instructions, follow up care, and prescriptions reviewed and explained, pt verbalized understanding and had no further questions on d/c.

## 2023-12-20 NOTE — ED Provider Notes (Signed)
 [place] EMERGENCY DEPARTMENT AT [facility] Provider Note   CSN: [id number] Arrival date & time: 12/20/23  1045     Patient presents with: Medication Refill   Sherry Key is a 38 y.o. female.   Pt is a 38 yo female with pmhx significant for cyclic vomiting syndrome, htn, pcos, crohn's disease, and endometriosis.  Pt said she was given a rx for phenergan  and bentyl  at her last visit and that has helped her more than anything.  She has been out for 3 days and is here to see if she can get a refill.  She was upstairs for a visit to ortho for a broken toe and came here after that visit.  She said she also made an appt to establish primary care upstairs, but they would not give her any meds as she was not established.  Pt said she is a little nauseous, but not too bad.  She just wants the rx and not any fluids or labs.       Prior to Admission medications   Medication Sig Start Date End Date Taking? Authorizing Provider  acetaminophen  (TYLENOL ) 500 MG tablet Take 500 mg by mouth every 6 (six) hours as needed for moderate pain or mild pain.    [provider]  dicyclomine  (BENTYL ) 20 MG tablet Take 1 tablet (20 mg total) by mouth 2 (two) times daily. 12/20/23   Sueellen Emery, MD  diphenhydrAMINE  (BENADRYL ) 25 MG tablet Take 25 mg by mouth every 6 (six) hours as needed for allergies.    [provider]  Multiple Vitamins-Minerals (CERTAVITE/ANTIOXIDANTS) TABS Take 1 tablet by mouth daily. 01/20/23   Regalado, Belkys A, MD  promethazine  (PHENERGAN ) 25 MG suppository Place 25 mg rectally every 6 (six) hours as needed for nausea or vomiting.    [provider]  promethazine  (PHENERGAN ) 25 MG tablet Take 1 tablet (25 mg total) by mouth every 6 (six) hours as needed for nausea or vomiting. 12/20/23   Sueellen Emery, MD  topiramate  (TOPAMAX ) 25 MG tablet Take 1 tablet (25 mg total) by mouth at bedtime. May increase to 50mg  after 7 days Patient taking  differently: Take 25 mg by mouth at bedtime. 11/03/22 11/18/24  Suellyn Emory, MD    Allergies: Zofran  [ondansetron  hcl], Droperidol , Ketorolac , Peanut-containing drug products, Tomato, Dicyclomine , Morphine , Oxycodone-acetaminophen , Zofran , Haloperidol , and Other    Review of Systems  Gastrointestinal:  Positive for nausea.  All other systems reviewed and are negative.   Updated Vital Signs BP (!) 152/105 (BP Location: Left Arm)   Pulse 86   Temp 98 F (36.7 C)   Resp 16   Ht 5' 3 (1.6 m)   Wt 70.3 kg   LMP 10/19/2019   SpO2 99%   BMI 27.46 kg/m   Physical Exam Vitals and nursing note reviewed.  Constitutional:      Appearance: Normal appearance.  HENT:     Head: Normocephalic and atraumatic.     Right Ear: External ear normal.     Left Ear: External ear normal.     Nose: Nose normal.     Mouth/Throat:     Mouth: Mucous membranes are moist.     Pharynx: Oropharynx is clear.   Eyes:     Extraocular Movements: Extraocular movements intact.     Conjunctiva/sclera: Conjunctivae normal.     Pupils: Pupils are equal, round, and reactive to light.    Cardiovascular:     Rate and Rhythm: Normal rate and regular  rhythm.     Pulses: Normal pulses.     Heart sounds: Normal heart sounds.  Pulmonary:     Effort: Pulmonary effort is normal.     Breath sounds: Normal breath sounds.  Abdominal:     General: Abdomen is flat.     Palpations: Abdomen is soft.   Musculoskeletal:        General: Normal range of motion.     Cervical back: Normal range of motion and neck supple.   Skin:    General: Skin is warm.     Capillary Refill: Capillary refill takes less than 2 seconds.   Neurological:     General: No focal deficit present.     Mental Status: She is alert and oriented to person, place, and time.   Psychiatric:        Mood and Affect: Mood normal.        Behavior: Behavior normal.     (all labs ordered are listed, but only abnormal results are  displayed) Labs Reviewed - No data to display  EKG: None  Radiology: No results found.   Procedures   Medications Ordered in the ED - No data to display                                  Medical Decision Making Risk Prescription drug management.   This patient presents to the ED for concern of med refill, this involves an extensive number of treatment options, and is a complaint that carries with it a high risk of complications and morbidity.  The differential diagnosis includes med refill   Co morbidities that complicate the patient evaluation  cyclic vomiting syndrome, htn, pcos, crohn's disease, and endometriosis   Additional history obtained:  Additional history obtained from epic chart review  Medicines ordered and prescription drug management:  I have reviewed the patients home medicines and have made adjustments as needed  Problem List / ED Course:  Cyclic vomiting syndrome:  pt said sx are stable right now.  She just wants the med refill, so that is provided for her.   Social Determinants of Health:  Lives at home   Dispostion:  After consideration of the diagnostic results and the patients response to treatment, I feel that the patent would benefit from discharge with outpatient f/u.       Final diagnoses:  Cyclic vomiting syndrome  Encounter for medication refill    ED Discharge Orders          Ordered    promethazine  (PHENERGAN ) 25 MG tablet  Every 6 hours PRN        12/20/23 1109    dicyclomine  (BENTYL ) 20 MG tablet  2 times daily        12/20/23 1109               Sueellen Emery, MD 12/20/23 1115

## 2023-12-20 NOTE — ED Triage Notes (Signed)
 Pt caox4 stating she was prescribed bentyl  on her last visit and she ran out of it 3 days ago and would like a refill because it has been helping. Unable to get in with PCP.

## 2024-01-02 ENCOUNTER — Ambulatory Visit (INDEPENDENT_AMBULATORY_CARE_PROVIDER_SITE_OTHER): Payer: MEDICAID | Admitting: Podiatry

## 2024-01-02 ENCOUNTER — Encounter: Payer: Self-pay | Admitting: Podiatry

## 2024-01-02 VITALS — Ht 63.0 in | Wt 155.0 lb

## 2024-01-02 DIAGNOSIS — L600 Ingrowing nail: Secondary | ICD-10-CM | POA: Diagnosis not present

## 2024-01-02 MED ORDER — CLOTRIMAZOLE-BETAMETHASONE 1-0.05 % EX CREA: 1.0000 | TOPICAL_CREAM | Freq: Every day | CUTANEOUS | 2 refills | Status: AC

## 2024-01-02 NOTE — Progress Notes (Signed)
 Chief Complaint  Patient presents with   Nail Problem    Pt is here due to left toenail issue she fell and fracture her great toe state the nail lifted up and is broken would like the toenail off.    HPI: 38 y.o. female presenting today as a new patient for evaluation of left great toenail pain.  Patient states that she injured her toenail about 1 month ago.  It has been painful and tender ever since.  She also had an acrylic nail on her nail plate which eventually came off.  She says the nail is completely disfigured and very tender  Past Medical History:  Diagnosis Date   Cannabinoid hyperemesis syndrome    Crohn's disease (HCC)    Cyclical vomiting    Drug-seeking behavior - pt seeking opiates specifically dilaudid  and IV phenergan . 09/24/2023   Endometriosis    Fibroids    History of partial hysterectomy 10/24/2019   Hypertension    PCOS (polycystic ovarian syndrome)     Past Surgical History:  Procedure Laterality Date   ABDOMINAL HYSTERECTOMY     CESAREAN SECTION     CESAREAN SECTION     CHOLECYSTECTOMY     HERNIA REPAIR     TONSILLECTOMY     TUBAL LIGATION     TUBAL LIGATION      Allergies  Allergen Reactions   Zofran  [Ondansetron  Hcl] Hives and Shortness Of Breath   Droperidol  Other (See Comments)    Tongue spasms, drooling, dystonia  Tongue spasm starts to stick out her tongue and wave it side-to-side.  Maintains airway, oxygenation okay, no respiratory distress no other symptoms.  Consider dystonic reaction vs volitional reaction.   Ketorolac  Other (See Comments)    Tongue spasms, drooling, dystonia   Peanut-Containing Drug Products Hives and Itching   Tomato Hives and Itching   Morphine  Other (See Comments)    Patient denies allergy    Oxycodone-Acetaminophen  Other (See Comments)    Patient denies allergy    Zofran  Hives and Itching   Haloperidol  Rash   Other Itching    Fuzzy fruit     Physical Exam: General: The patient is alert and oriented  x3 in no acute distress.  Dermatology: Skin is warm, dry and supple bilateral lower extremities.  Dystrophic mycotic nails noted to the left hallux nail plate with associated tenderness Diffuse hyperkeratosis of the skin with peeling noted and pruritus consistent with athlete's foot/tinea pedis  Vascular: Palpable pedal pulses bilaterally. Capillary refill within normal limits.  No appreciable edema.  No erythema.  Neurological: Grossly intact via light touch  Musculoskeletal Exam: No pedal deformities noted  Assessment/Plan of Care: 1.  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  Athlete's foot/tinea pedis left  -Patient evaluated -Prescription for Lotrisone cream apply twice daily -Due to the sensitivity of the nail plate I do believe that total temporary nail avulsion is appropriate to avulsed the nail and allowing the nail to grow in.  This was discussed with the patient and she agrees and would like to have this performed today -Digital block performed using 3 mL of 2% lidocaine  plain -The toe was prepped in aseptic manner and the nail was avulsed in its entirety followed by dry dressings.  Post care instructions provided -Return to clinic PRN       Thresa EMERSON Sar, DPM [facility]  Dr. Thresa EMERSON Sar, DPM    [address]  [place], [place] [address]                Office [phone]  Fax [phone]

## 2024-01-21 ENCOUNTER — Other Ambulatory Visit (HOSPITAL_COMMUNITY): Payer: Self-pay

## 2024-01-21 ENCOUNTER — Other Ambulatory Visit (HOSPITAL_BASED_OUTPATIENT_CLINIC_OR_DEPARTMENT_OTHER): Payer: Self-pay | Admitting: Emergency Medicine

## 2024-01-21 ENCOUNTER — Other Ambulatory Visit (HOSPITAL_BASED_OUTPATIENT_CLINIC_OR_DEPARTMENT_OTHER): Payer: Self-pay

## 2024-01-22 ENCOUNTER — Other Ambulatory Visit (HOSPITAL_BASED_OUTPATIENT_CLINIC_OR_DEPARTMENT_OTHER): Payer: Self-pay

## 2024-01-22 ENCOUNTER — Other Ambulatory Visit (HOSPITAL_COMMUNITY): Payer: Self-pay

## 2024-01-23 ENCOUNTER — Encounter (HOSPITAL_BASED_OUTPATIENT_CLINIC_OR_DEPARTMENT_OTHER): Payer: Self-pay

## 2024-01-23 ENCOUNTER — Other Ambulatory Visit (HOSPITAL_BASED_OUTPATIENT_CLINIC_OR_DEPARTMENT_OTHER): Payer: Self-pay

## 2024-02-01 ENCOUNTER — Encounter (HOSPITAL_BASED_OUTPATIENT_CLINIC_OR_DEPARTMENT_OTHER): Payer: Self-pay

## 2024-02-01 ENCOUNTER — Emergency Department (HOSPITAL_BASED_OUTPATIENT_CLINIC_OR_DEPARTMENT_OTHER): Payer: MEDICAID

## 2024-02-01 ENCOUNTER — Other Ambulatory Visit (HOSPITAL_BASED_OUTPATIENT_CLINIC_OR_DEPARTMENT_OTHER): Payer: Self-pay

## 2024-02-01 ENCOUNTER — Other Ambulatory Visit: Payer: Self-pay

## 2024-02-01 ENCOUNTER — Emergency Department (HOSPITAL_BASED_OUTPATIENT_CLINIC_OR_DEPARTMENT_OTHER)
Admission: EM | Admit: 2024-02-01 | Discharge: 2024-02-01 | Disposition: A | Discharge status: Home / Self Care | Payer: MEDICAID

## 2024-02-01 DIAGNOSIS — R1084 Generalized abdominal pain: Secondary | ICD-10-CM | POA: Insufficient documentation

## 2024-02-01 DIAGNOSIS — I10 Essential (primary) hypertension: Secondary | ICD-10-CM | POA: Insufficient documentation

## 2024-02-01 DIAGNOSIS — R112 Nausea with vomiting, unspecified: Secondary | ICD-10-CM | POA: Diagnosis not present

## 2024-02-01 DIAGNOSIS — R519 Headache, unspecified: Secondary | ICD-10-CM | POA: Insufficient documentation

## 2024-02-01 DIAGNOSIS — Z91010 Allergy to peanuts: Secondary | ICD-10-CM | POA: Diagnosis not present

## 2024-02-01 DIAGNOSIS — R7309 Other abnormal glucose: Secondary | ICD-10-CM | POA: Insufficient documentation

## 2024-02-01 LAB — URINALYSIS, W/ REFLEX TO CULTURE (INFECTION SUSPECTED)
Bilirubin Urine: NEGATIVE
Glucose, UA: NEGATIVE mg/dL
Ketones, ur: NEGATIVE mg/dL
Leukocytes,Ua: NEGATIVE
Nitrite: NEGATIVE
Protein, ur: NEGATIVE mg/dL
Specific Gravity, Urine: 1.02 (ref 1.005–1.030)
WBC, UA: NONE SEEN WBC/hpf (ref 0–5)
pH: 7.5 (ref 5.0–8.0)

## 2024-02-01 LAB — CBC
HCT: 38.6 % (ref 36.0–46.0)
Hemoglobin: 12.7 g/dL (ref 12.0–15.0)
MCH: 31.4 pg (ref 26.0–34.0)
MCHC: 32.9 g/dL (ref 30.0–36.0)
MCV: 95.3 fL (ref 80.0–100.0)
Platelets: 282 K/uL (ref 150–400)
RBC: 4.05 MIL/uL (ref 3.87–5.11)
RDW: 12.3 % (ref 11.5–15.5)
WBC: 14.2 K/uL — ABNORMAL HIGH (ref 4.0–10.5)
nRBC: 0 % (ref 0.0–0.2)

## 2024-02-01 LAB — COMPREHENSIVE METABOLIC PANEL WITH GFR
ALT: 15 U/L (ref 0–44)
AST: 19 U/L (ref 15–41)
Albumin: 4.2 g/dL (ref 3.5–5.0)
Alkaline Phosphatase: 82 U/L (ref 38–126)
Anion gap: 11 (ref 5–15)
BUN: 9 mg/dL (ref 6–20)
CO2: 23 mmol/L (ref 22–32)
Calcium: 9.3 mg/dL (ref 8.9–10.3)
Chloride: 104 mmol/L (ref 98–111)
Creatinine, Ser: 0.73 mg/dL (ref 0.44–1.00)
GFR, Estimated: >60 mL/min (ref >60)
Glucose, Bld: 101 mg/dL — ABNORMAL HIGH (ref 70–99)
Potassium: 4 mmol/L (ref 3.5–5.1)
Sodium: 138 mmol/L (ref 135–145)
Total Bilirubin: 0.3 mg/dL (ref 0.0–1.2)
Total Protein: 7.3 g/dL (ref 6.5–8.1)

## 2024-02-01 LAB — LIPASE, BLOOD: Lipase: 198 U/L — ABNORMAL HIGH (ref 11–51)

## 2024-02-01 LAB — RESP PANEL BY RT-PCR (RSV, FLU A&B, COVID)  RVPGX2
Influenza A by PCR: NEGATIVE
Influenza B by PCR: NEGATIVE
Resp Syncytial Virus by PCR: NEGATIVE
SARS Coronavirus 2 by RT PCR: NEGATIVE

## 2024-02-01 LAB — CBG MONITORING, ED: Glucose-Capillary: 106 mg/dL — ABNORMAL HIGH (ref 70–99)

## 2024-02-01 MED ORDER — IOHEXOL 300 MG/ML  SOLN
100.0000 mL | Freq: Once | INTRAMUSCULAR | Status: AC | PRN
  Administered 2024-02-01: 100 mL via INTRAVENOUS

## 2024-02-01 MED ORDER — PROMETHAZINE HCL 25 MG RE SUPP
25.0000 mg | Freq: Four times a day (QID) | RECTAL | 0 refills | Status: AC | PRN
  Filled 2024-02-01: qty 6, 2d supply, fill #0

## 2024-02-01 MED ORDER — DIPHENHYDRAMINE HCL 50 MG/ML IJ SOLN
12.5000 mg | Freq: Once | INTRAMUSCULAR | Status: AC
  Administered 2024-02-01: 12.5 mg via INTRAVENOUS
  Filled 2024-02-01: qty 1

## 2024-02-01 MED ORDER — PROMETHAZINE HCL 25 MG PO TABS
25.0000 mg | ORAL_TABLET | Freq: Four times a day (QID) | ORAL | 0 refills | Status: DC | PRN
  Filled 2024-02-01: qty 30, 8d supply, fill #0

## 2024-02-01 MED ORDER — LACTATED RINGERS IV BOLUS
1000.0000 mL | Freq: Once | INTRAVENOUS | Status: AC
  Administered 2024-02-01: 1000 mL via INTRAVENOUS

## 2024-02-01 MED ORDER — PROCHLORPERAZINE EDISYLATE 10 MG/2ML IJ SOLN
10.0000 mg | Freq: Once | INTRAMUSCULAR | Status: AC
  Administered 2024-02-01: 10 mg via INTRAVENOUS
  Filled 2024-02-01: qty 2

## 2024-02-01 MED ORDER — ACETAMINOPHEN 500 MG PO TABS
1000.0000 mg | ORAL_TABLET | Freq: Once | ORAL | Status: AC
  Administered 2024-02-01: 1000 mg via ORAL
  Filled 2024-02-01: qty 2

## 2024-02-01 NOTE — ED Triage Notes (Signed)
 Pt states that she woke up this morning with a severe headache along with NVD. Pt states that her BP is up when she has an episode.

## 2024-02-01 NOTE — ED Notes (Signed)
 Pt. Urinated in emesis bag after being instructed to call out for help to use the bathroom.

## 2024-02-01 NOTE — ED Notes (Signed)
 Patient transported to CT

## 2024-02-01 NOTE — ED Notes (Signed)
 Pt back from CT

## 2024-02-01 NOTE — ED Notes (Signed)
 Pt has history of drug seeking behaviors. Denies any drug use

## 2024-02-01 NOTE — ED Notes (Signed)
 Pt c/o tongue feeling strange after administration of compazine . O2 @100  ra. No swelling noted. Pt is able to move tongue. Pt educated on possible side effects. EDP aware.

## 2024-02-01 NOTE — ED Provider Notes (Signed)
  EMERGENCY DEPARTMENT AT [facility] Provider Note   CSN: [id number] Arrival date & time: 02/01/24  9243     Patient presents with: Abdominal Pain and Headache   Sherry Key is a 38 y.o. female.   Is a 38 year old female presenting emergency department with abdominal pain, nausea vomiting and headache.  For the past several days reports that she has had generalized abdominal pain with frequent episodes of nausea and vomiting similar to prior abdominal pain.  This morning however woke up with severe headache.  Reports that she does have a history of headache and was on medication at 1 point, but not currently.  Reports this headache is different than other.  No vision loss, facial droop, unilateral weakness.  No photophobia or neck stiffness.   Abdominal Pain Headache Associated symptoms: abdominal pain        Prior to Admission medications   Medication Sig Start Date End Date Taking? Authorizing Provider  acetaminophen  (TYLENOL ) 500 MG tablet Take 500 mg by mouth every 6 (six) hours as needed for moderate pain or mild pain.    [provider]  clotrimazole -betamethasone  (LOTRISONE ) cream Apply 1 Application topically daily. 01/02/24   Janit Thresa HERO, DPM  dicyclomine  (BENTYL ) 20 MG tablet Take 1 tablet (20 mg total) by mouth 2 (two) times daily. 12/20/23   Dean Clarity, MD  diphenhydrAMINE  (BENADRYL ) 25 MG tablet Take 25 mg by mouth every 6 (six) hours as needed for allergies.    [provider]  Multiple Vitamins-Minerals (CERTAVITE/ANTIOXIDANTS) TABS Take 1 tablet by mouth daily. 01/20/23   Regalado, Belkys A, MD  promethazine  (PHENERGAN ) 25 MG suppository Place 25 mg rectally every 6 (six) hours as needed for nausea or vomiting.    [provider]  promethazine  (PHENERGAN ) 25 MG tablet Take 1 tablet (25 mg total) by mouth every 6 (six) hours as needed for nausea or vomiting. 12/20/23   Dean Clarity, MD  topiramate  (TOPAMAX )  25 MG tablet Take 1 tablet (25 mg total) by mouth at bedtime. May increase to 50mg  after 7 days Patient taking differently: Take 25 mg by mouth at bedtime. 11/03/22 11/18/24  Lang Dover, MD    Allergies: Zofran  [ondansetron  hcl], Droperidol , Ketorolac , Peanut-containing drug products, Tomato, Morphine , Oxycodone-acetaminophen , Zofran , Haloperidol , and Other    Review of Systems  Gastrointestinal:  Positive for abdominal pain.  Neurological:  Positive for headaches.    Updated Vital Signs BP (!) 141/95   Pulse 76   Temp 98.2 F (36.8 C)   Resp 18   Ht 5' 3 (1.6 m)   Wt 72.6 kg   LMP 10/19/2019   SpO2 100%   BMI 28.34 kg/m   Physical Exam Vitals and nursing note reviewed.  Constitutional:      General: She is not in acute distress.    Appearance: She is not toxic-appearing.  Cardiovascular:     Rate and Rhythm: Normal rate and regular rhythm.  Pulmonary:     Effort: Pulmonary effort is normal.     Breath sounds: Normal breath sounds.  Abdominal:     General: Abdomen is flat.     Tenderness: There is generalized abdominal tenderness. There is no guarding or rebound.  Genitourinary:    Rectum: Normal.  Skin:    General: Skin is warm and dry.  Neurological:     General: No focal deficit present.     Mental Status: She is alert and oriented to person, place, and time.  Cranial Nerves: No cranial nerve deficit.     Motor: No weakness.  Psychiatric:        Mood and Affect: Mood normal.        Behavior: Behavior normal.     (all labs ordered are listed, but only abnormal results are displayed) Labs Reviewed  CBC - Abnormal; Notable for the following components:      Result Value   WBC 14.2 (*)    All other components within normal limits  COMPREHENSIVE METABOLIC PANEL WITH GFR - Abnormal; Notable for the following components:   Glucose, Bld 101 (*)    All other components within normal limits  LIPASE, BLOOD - Abnormal; Notable for the following components:    Lipase 198 (*)    All other components within normal limits  URINALYSIS, W/ REFLEX TO CULTURE (INFECTION SUSPECTED) - Abnormal; Notable for the following components:   Color, Urine STRAW (*)    Hgb urine dipstick TRACE (*)    Bacteria, UA RARE (*)    All other components within normal limits  CBG MONITORING, ED - Abnormal; Notable for the following components:   Glucose-Capillary 106 (*)    All other components within normal limits  RESP PANEL BY RT-PCR (RSV, FLU A&B, COVID)  [id number]    EKG: None  Radiology: CT ABDOMEN PELVIS W CONTRAST Result Date: 02/01/2024 CLINICAL DATA:  Acute pancreatitis, nausea, vomiting EXAM: CT ABDOMEN AND PELVIS WITH CONTRAST TECHNIQUE: Multidetector CT imaging of the abdomen and pelvis was performed using the standard protocol following bolus administration of intravenous contrast. RADIATION DOSE REDUCTION: This exam was performed according to the departmental dose-optimization program which includes automated exposure control, adjustment of the mA and/or kV according to patient size and/or use of iterative reconstruction technique. CONTRAST:  OMNIPAQUE  IOHEXOL  300 MG/ML  SOLN COMPARISON:  11/18/2023 FINDINGS: Lower chest: No pleural or pericardial effusion. Visualized lung bases clear. Hepatobiliary: No focal liver abnormality is seen. Status post cholecystectomy. No biliary dilatation. Pancreas: Unremarkable. No pancreatic ductal dilatation or surrounding inflammatory changes. Spleen: Normal in size without focal abnormality. Adrenals/Urinary Tract: Adrenal glands are unremarkable. Kidneys are normal, without renal calculi, focal lesion, or hydronephrosis. Bladder is unremarkable. Stomach/Bowel: Stomach decompressed. Small bowel nondistended. Normal appendix. Colon is nondistended, without acute finding. Vascular/Lymphatic: No significant vascular findings are present. No enlarged abdominal or pelvic lymph nodes. Reproductive: Status post hysterectomy. No  adnexal masses. Other: No ascites.  No free air. Musculoskeletal: Chronic mild L1 superior endplate compression deformity. No acute findings. IMPRESSION: 1. No acute findings. 2. Status post cholecystectomy and hysterectomy. Electronically Signed   By: JONETTA Faes M.D.   On: 02/01/2024 11:42   CT Head Wo Contrast Result Date: 02/01/2024 CLINICAL DATA:  Headache, sudden, severe EXAM: CT HEAD WITHOUT CONTRAST TECHNIQUE: Contiguous axial images were obtained from the base of the skull through the vertex without intravenous contrast. RADIATION DOSE REDUCTION: This exam was performed according to the departmental dose-optimization program which includes automated exposure control, adjustment of the mA and/or kV according to patient size and/or use of iterative reconstruction technique. COMPARISON:  September 22, 2023 FINDINGS: Brain: The ventricles appear age appropriate. No mass effect or midline shift. Gray-white differentiation is preserved without focal attenuation abnormality.No evidence of acute territorial infarction, extra-axial fluid collection, hemorrhage, or mass lesion. Similarly appearing partially empty sella. The basilar cisterns are patent without downward herniation. The cerebellar hemispheres and vermis are well formed without mass lesion or focal attenuation abnormality. Vascular: No hyperdense vessel. Skull: Normal. Negative for  fracture or focal lesion. Sinuses/Orbits: The paranasal sinuses and mastoids are clear.The globes appear intact. No retrobulbar hematoma. Other: None. IMPRESSION: No acute intracranial abnormality, specifically, no acute hemorrhage, territorial infarction, or intracranial mass. Electronically Signed   By: Rogelia Myers M.D.   On: 02/01/2024 11:02     Procedures   Medications Ordered in the ED  acetaminophen  (TYLENOL ) tablet 1,000 mg (1,000 mg Oral Given 02/01/24 0903)  lactated ringers  bolus 1,000 mL (0 mLs Intravenous Stopped 02/01/24 1106)  prochlorperazine   (COMPAZINE ) injection 10 mg (10 mg Intravenous Given 02/01/24 0946)  diphenhydrAMINE  (BENADRYL ) injection 12.5 mg (12.5 mg Intravenous Given 02/01/24 0946)  iohexol  (OMNIPAQUE ) 300 MG/ML solution 100 mL (100 mLs Intravenous Contrast Given 02/01/24 1116)    Clinical Course as of 02/01/24 1159  Fri Feb 01, 2024  1105 CT Head Wo Contrast IMPRESSION: No acute intracranial abnormality, specifically, no acute hemorrhage, territorial infarction, or intracranial mass.   Electronically Signed   By: Rogelia Myers M.D.   On: 02/01/2024 11:02     [TY]  1151 CT ABDOMEN PELVIS W CONTRAST IMPRESSION: 1. No acute findings. 2. Status post cholecystectomy and hysterectomy.   Electronically Signed   By: JONETTA Faes M.D.   On: 02/01/2024 11:42   [TY]  1157 Patient reevaluated.  Reports headache almost essentially resolved.  Nausea improved as well.  Tolerating p.o.  Updated her on test results and CT findings.  She is feeling improved enough to go home.  Discussed bowel rest and hydration.  I suspect her elevated lipase is reactionary, but did give return precautions if symptoms worsen to return immediately. [TY]    Clinical Course User Index [TY] Neysa Caron PARAS, DO                                 Medical Decision Making This is a 38 year old female presenting emergency department with abdominal pain nausea vomiting.  She is afebrile nontachycardic, slightly hypertensive.  Physical exam with no localizing deficits.  Does have a complex past medical history to include hypertension, cyclical vomiting, PCOS with history of endometriosis status post hysterectomy.  Documented cannabinoid hyperemesis syndrome.  Per chart review has had 3 negative CT scans of her abdomen this year, had a negative venogram in March of her head.  She notes her headache today is severe and different than her typical headache pattern.  Will repeat CT head.  Labs with elevated lipase, and what appears to be a chronic  leukocytosis.  No significant metabolic derangements.  Normal kidney function.  No transaminitis to suggest hepatobiliary disease.  Concern for possible viral etiology of symptoms, therefore flu/COVID/RSV was ordered.  They were negative.  CT abdomen pending to evaluate pancreas/pancreatitis.  It does not appear that on prior CT scan she did have a small pancreatic cyst/mass that was seen on 2, but not on the third CT scan.  Treated with migraine cocktail, IV fluids.  See ED course for further MDM and final disposition.  Amount and/or Complexity of Data Reviewed Labs: ordered. Radiology: ordered. Decision-making details documented in ED Course.  Risk OTC drugs. Prescription drug management.       Final diagnoses:  None    ED Discharge Orders     None          Neysa Caron PARAS, DO 02/01/24 1159

## 2024-02-01 NOTE — ED Notes (Signed)
 Pt is a difficult stick. Attempted IV x 4 by staff. EDP notified and he is going to do an US  IV.

## 2024-02-01 NOTE — Discharge Instructions (Signed)
 Please return immediately if develop fevers, chills, vision loss, facial droop, unilateral weakness, seizures, passout, chest pain, shortness of breath.  Also, please return if develop severe abdominal pain, inability eat or drink due to nausea vomiting or any new or worsening symptoms that are concerning to you.  As discussed, you may try bowel rest/liquid diet to further improve your symptoms with a slow stair step approach to reintroducing more solid and complex foods.

## 2024-03-05 ENCOUNTER — Ambulatory Visit (INDEPENDENT_AMBULATORY_CARE_PROVIDER_SITE_OTHER): Payer: MEDICAID | Admitting: Family Medicine

## 2024-03-05 ENCOUNTER — Other Ambulatory Visit (HOSPITAL_BASED_OUTPATIENT_CLINIC_OR_DEPARTMENT_OTHER): Payer: Self-pay

## 2024-03-05 ENCOUNTER — Other Ambulatory Visit: Payer: Self-pay | Admitting: Internal Medicine

## 2024-03-05 ENCOUNTER — Encounter (HOSPITAL_BASED_OUTPATIENT_CLINIC_OR_DEPARTMENT_OTHER): Payer: Self-pay | Admitting: Family Medicine

## 2024-03-05 VITALS — BP 140/98 | HR 94 | Ht 63.0 in | Wt 169.8 lb

## 2024-03-05 DIAGNOSIS — G932 Benign intracranial hypertension: Secondary | ICD-10-CM | POA: Diagnosis not present

## 2024-03-05 DIAGNOSIS — F12288 Cannabis dependence with other cannabis-induced disorder: Secondary | ICD-10-CM

## 2024-03-05 MED ORDER — TOPIRAMATE 25 MG PO TABS
ORAL_TABLET | ORAL | 3 refills | Status: AC
  Filled 2024-03-05: qty 60, 33d supply, fill #0
  Filled 2024-04-10: qty 60, 4d supply, fill #1
  Filled 2024-05-26: qty 60, 30d supply, fill #2

## 2024-03-05 MED ORDER — CHLORTHALIDONE 15 MG PO TABS
15.0000 mg | ORAL_TABLET | Freq: Every day | ORAL | 3 refills | Status: DC
  Filled 2024-03-05: qty 30, 30d supply, fill #0
  Filled 2024-04-10: qty 30, 30d supply, fill #1
  Filled 2024-05-26: qty 30, 30d supply, fill #2

## 2024-03-05 MED ORDER — PROMETHAZINE HCL 25 MG PO TABS
25.0000 mg | ORAL_TABLET | Freq: Four times a day (QID) | ORAL | 3 refills | Status: DC | PRN
  Filled 2024-03-05: qty 30, 8d supply, fill #0
  Filled 2024-04-10: qty 30, 8d supply, fill #1
  Filled 2024-05-26: qty 30, 8d supply, fill #2

## 2024-03-05 MED ORDER — DICYCLOMINE HCL 20 MG PO TABS
20.0000 mg | ORAL_TABLET | Freq: Two times a day (BID) | ORAL | 2 refills | Status: AC
  Filled 2024-03-05: qty 60, 30d supply, fill #0
  Filled 2024-05-26: qty 60, 30d supply, fill #1

## 2024-03-05 NOTE — Progress Notes (Signed)
 New Patient Office Visit  Subjective:   Sherry Key 1985/10/17 03/05/2024  Chief Complaint  Patient presents with   New Patient (Initial Visit)    Patient is here today to get established with the practice. Pt states she is needing medication refills. Has been having BP problems and will feel lightheaded and have headaches due to it.     HPI:   Patient is here to establish care with PCP.  His PCP included at [facility] internal medicine per chart review.  She states she has been having significant blood pressure problems, feeling lightheaded and having recurring headaches.  She has a history of idiopathic intracranial hypertension.  She states she has been out of her Topamax  and blood pressure medication for several months.  Reports she was previously on HCTZ which helps control BP well.  She has a history of cannabinoid hyperemesis syndrome and was taken off from HCTZ due to recurrent nausea and vomiting.  She was placed on propranolol , but states that this did not control BP well and did not tolerate.  She reports cyclic vomiting syndrome is currently under control and has not had a recent flare.  She was previously in the ER for headache, nausea vomiting in July 2025.  ER visit and lab results reviewed with patient.  She is requesting refill of Bentyl  and Topamax  today.  Her last follow-up with neurology was in July 2024 with [facility] with [facility] health.  She was recommended to follow-up for surveillance with neurology.  Per chart review she was started on Diamox  250 mg twice daily for intracranial hypertension.  Patient declines taking this currently.   The following portions of the patient's history were reviewed and updated as appropriate: past medical history, past surgical history, family history, social history, allergies, medications, and problem list.   Patient Active Problem List   Diagnosis Date Noted   AKI (acute kidney injury) (HCC) 11/18/2023   Drug-seeking  behavior - pt seeking opiates specifically dilaudid  and IV phenergan . 09/24/2023   Cannabis hyperemesis syndrome concurrent with and due to cannabis dependence (HCC) 09/23/2023   Diarrhea 09/23/2023   Accelerated hypertension 09/22/2023   Hypokalemia 09/22/2023   Dehydration 09/22/2023   Idiopathic intracranial hypertension 09/22/2023   Nausea & vomiting 09/20/2023   Abdominal pain with vomiting 01/16/2023   Leukocytosis 01/16/2023   Past Medical History:  Diagnosis Date   Cannabinoid hyperemesis syndrome    Crohn's disease (HCC)    Cyclical vomiting    Drug-seeking behavior - pt seeking opiates specifically dilaudid  and IV phenergan . 09/24/2023   Endometriosis    Fibroids    History of partial hysterectomy 10/24/2019   Hypertension    PCOS (polycystic ovarian syndrome)    Past Surgical History:  Procedure Laterality Date   ABDOMINAL HYSTERECTOMY     CESAREAN SECTION     CESAREAN SECTION     CHOLECYSTECTOMY     HERNIA REPAIR     TONSILLECTOMY     TUBAL LIGATION     TUBAL LIGATION     History reviewed. No pertinent family history. Social History   Socioeconomic History   Marital status: Single    Spouse name: Not on file   Number of children: Not on file   Years of education: Not on file   Highest education level: Not on file  Occupational History   Not on file  Tobacco Use   Smoking status: Former    Current packs/day: 0.50    Types: Cigarettes  Smokeless tobacco: Never  Vaping Use   Vaping status: Never Used  Substance and Sexual Activity   Alcohol use: No   Drug use: Yes    Types: Marijuana   Sexual activity: Not on file  Other Topics Concern   Not on file  Social History Narrative   ** Merged History Encounter **       Social Drivers of Health   Financial Resource Strain: Not on file  Food Insecurity: Food Insecurity Present (11/19/2023)   Hunger Vital Sign    Worried About Running Out of Food in the Last Year: Sometimes true    Ran Out of Food  in the Last Year: Sometimes true  Transportation Needs: No Transportation Needs (11/19/2023)   PRAPARE - Administrator, Civil Service (Medical): No    Lack of Transportation (Non-Medical): No  Physical Activity: Not on file  Stress: Not on file  Social Connections: Unknown (11/15/2021)   Received from [facility]   Social Network    Social Network: Not on file  Intimate Partner Violence: Not At Risk (11/19/2023)   Humiliation, Afraid, Rape, and Kick questionnaire    Fear of Current or Ex-Partner: No    Emotionally Abused: No    Physically Abused: No    Sexually Abused: No   Outpatient Medications Prior to Visit  Medication Sig Dispense Refill   acetaminophen  (TYLENOL ) 500 MG tablet Take 500 mg by mouth every 6 (six) hours as needed for moderate pain or mild pain.     clotrimazole -betamethasone  (LOTRISONE ) cream Apply 1 Application topically daily. 45 g 2   dicyclomine  (BENTYL ) 20 MG tablet Take 1 tablet (20 mg total) by mouth 2 (two) times daily. 30 tablet 1   diphenhydrAMINE  (BENADRYL ) 25 MG tablet Take 25 mg by mouth every 6 (six) hours as needed for allergies.     Multiple Vitamins-Minerals (CERTAVITE/ANTIOXIDANTS) TABS Take 1 tablet by mouth daily. 30 tablet 0   promethazine  (PHENERGAN ) 25 MG suppository Place 1 suppository (25 mg total) rectally every 6 (six) hours as needed for nausea or vomiting. 6 each 0   promethazine  (PHENERGAN ) 25 MG tablet Take 1 tablet (25 mg total) by mouth every 6 (six) hours as needed for nausea or vomiting. 30 tablet 0   topiramate  (TOPAMAX ) 25 MG tablet Take 1 tablet (25 mg total) by mouth at bedtime. May increase to 50mg  after 7 days (Patient taking differently: Take 25 mg by mouth at bedtime.) 60 tablet 2   No facility-administered medications prior to visit.   Allergies  Allergen Reactions   Zofran  [Ondansetron  Hcl] Hives and Shortness Of Breath   Droperidol  Other (See Comments)    Tongue spasms, drooling, dystonia  Tongue spasm  starts to stick out her tongue and wave it side-to-side.  Maintains airway, oxygenation okay, no respiratory distress no other symptoms.  Consider dystonic reaction vs volitional reaction.   Ketorolac  Other (See Comments)    Tongue spasms, drooling, dystonia   Peanut-Containing Drug Products Hives and Itching   Tomato Hives and Itching   Zofran  Hives and Itching   Haloperidol  Rash   Other Itching    Fuzzy fruit    ROS: A complete ROS was performed with pertinent positives/negatives noted in the HPI. The remainder of the ROS are negative.   Objective:   Today's Vitals   03/05/24 1059  BP: (!) 142/116  Pulse: 94  SpO2: 100%  Weight: 169 lb 12.8 oz (77 kg)  Height: 5' 3 (1.6 m)  GENERAL: Well-appearing, in NAD. Well nourished.  SKIN: Pink, warm and dry. No rash, lesion, ulceration, or ecchymoses.  Head: Normocephalic. NECK: Trachea midline. Full ROM w/o pain or tenderness.  RESPIRATORY: Chest wall symmetrical. Respirations even and non-labored. Breath sounds clear to auscultation bilaterally.  CARDIAC: S1, S2 present, regular rate and rhythm without murmur or gallops. Peripheral pulses 2+ bilaterally.  MSK: Muscle tone and strength appropriate for age.  NEUROLOGIC: No motor or sensory deficits. Steady, even gait. C2-C12 intact.  PSYCH/MENTAL STATUS: Alert, oriented x 3. Cooperative, appropriate mood and affect.      Assessment & Plan:   1. Idiopathic intracranial hypertension (Primary) Currently uncontrolled.  Will restart Topamax  as directed.  Will start chlorthalidone  15 mg daily and follow-up in 2 weeks for BMP.  We discussed concerns regarding fluid loss if nausea vomiting occurs and patient verbalized understanding.  She will reach out to PCP if this occurs.  I also recommend she reestablish with neurology for concurrent management of intracranial hypertension and referral has been placed.  Patient to monitor BP regularly.  - topiramate  (TOPAMAX ) 25 MG tablet; Take 1  tablet (25 mg total) by mouth at bedtime for 7 days, THEN 2 tablets (50 mg total) daily.  Dispense: 60 tablet; Refill: 3 - chlorthalidone  (THALITONE ) 15 MG tablet; Take 1 tablet (15 mg total) by mouth daily.  Dispense: 30 tablet; Refill: 3 - Ambulatory referral to Neurology  2. Cannabis hyperemesis syndrome concurrent with and due to cannabis dependence [facility]) Currently well-controlled per patient.  Bentyl  refilled per patient request.    Patient to reach out to office if new, worrisome, or unresolved symptoms arise or if no improvement in patient's condition. Patient verbalized understanding and is agreeable to treatment plan. All questions answered to patient's satisfaction.    No follow-ups on file.    Thersia Schuyler Stark, [place]

## 2024-03-06 ENCOUNTER — Other Ambulatory Visit (HOSPITAL_BASED_OUTPATIENT_CLINIC_OR_DEPARTMENT_OTHER): Payer: Self-pay

## 2024-03-21 ENCOUNTER — Ambulatory Visit (HOSPITAL_BASED_OUTPATIENT_CLINIC_OR_DEPARTMENT_OTHER): Payer: MEDICAID | Admitting: Family Medicine

## 2024-03-24 ENCOUNTER — Other Ambulatory Visit (HOSPITAL_COMMUNITY)
Admission: RE | Admit: 2024-03-24 | Discharge: 2024-03-24 | Disposition: A | Discharge status: Home / Self Care | Payer: MEDICAID | Source: Ambulatory Visit | Attending: Certified Nurse Midwife | Admitting: Certified Nurse Midwife

## 2024-03-24 ENCOUNTER — Ambulatory Visit (HOSPITAL_BASED_OUTPATIENT_CLINIC_OR_DEPARTMENT_OTHER): Payer: MEDICAID | Admitting: Certified Nurse Midwife

## 2024-03-24 ENCOUNTER — Encounter (HOSPITAL_BASED_OUTPATIENT_CLINIC_OR_DEPARTMENT_OTHER): Payer: Self-pay | Admitting: Certified Nurse Midwife

## 2024-03-24 ENCOUNTER — Other Ambulatory Visit (HOSPITAL_BASED_OUTPATIENT_CLINIC_OR_DEPARTMENT_OTHER): Payer: Self-pay

## 2024-03-24 VITALS — BP 110/88 | HR 89 | Ht 63.0 in | Wt 167.2 lb

## 2024-03-24 DIAGNOSIS — Z113 Encounter for screening for infections with a predominantly sexual mode of transmission: Secondary | ICD-10-CM | POA: Diagnosis present

## 2024-03-24 DIAGNOSIS — N898 Other specified noninflammatory disorders of vagina: Secondary | ICD-10-CM | POA: Insufficient documentation

## 2024-03-24 DIAGNOSIS — Z202 Contact with and (suspected) exposure to infections with a predominantly sexual mode of transmission: Secondary | ICD-10-CM

## 2024-03-24 DIAGNOSIS — Z01419 Encounter for gynecological examination (general) (routine) without abnormal findings: Secondary | ICD-10-CM

## 2024-03-24 MED ORDER — FLUCONAZOLE 150 MG PO TABS
150.0000 mg | ORAL_TABLET | ORAL | 2 refills | Status: DC | PRN
  Filled 2024-03-24: qty 2, 3d supply, fill #0

## 2024-03-24 MED ORDER — AZITHROMYCIN 500 MG PO TABS
1000.0000 mg | ORAL_TABLET | Freq: Once | ORAL | 0 refills | Status: AC
  Filled 2024-03-24: qty 2, 1d supply, fill #0

## 2024-03-24 NOTE — Progress Notes (Signed)
 GYNECOLOGY  VISIT  CC:   Her boyfriend recently tested + for Chlamydia and pt would like to be treated and have STI Screening.  HPI: 38 y.o. [id number] Single Black or African American female here for STI screening. Declines Flu Vaccine. Reports Hysterectomy due to benign disease (endometriosis). Pt does breast exams.   Past Medical History:  Diagnosis Date   Cannabinoid hyperemesis syndrome    Crohn's disease (HCC)    Cyclical vomiting    Drug-seeking behavior - pt seeking opiates specifically dilaudid  and IV phenergan . 09/24/2023   Endometriosis    Fibroids    History of partial hysterectomy 10/24/2019   Hypertension    PCOS (polycystic ovarian syndrome)     MEDS:   Current Outpatient Medications on File Prior to Visit  Medication Sig Dispense Refill   acetaminophen  (TYLENOL ) 500 MG tablet Take 500 mg by mouth every 6 (six) hours as needed for moderate pain or mild pain.     chlorthalidone  (THALITONE ) 15 MG tablet Take 1 tablet (15 mg total) by mouth daily. 30 tablet 3   clotrimazole -betamethasone  (LOTRISONE ) cream Apply 1 Application topically daily. 45 g 2   dicyclomine  (BENTYL ) 20 MG tablet Take 1 tablet (20 mg total) by mouth 2 (two) times daily. 60 tablet 2   diphenhydrAMINE  (BENADRYL ) 25 MG tablet Take 25 mg by mouth every 6 (six) hours as needed for allergies.     Multiple Vitamins-Minerals (CERTAVITE/ANTIOXIDANTS) TABS Take 1 tablet by mouth daily. 30 tablet 0   promethazine  (PHENERGAN ) 25 MG suppository Place 1 suppository (25 mg total) rectally every 6 (six) hours as needed for nausea or vomiting. 6 each 0   promethazine  (PHENERGAN ) 25 MG tablet Take 1 tablet (25 mg total) by mouth every 6 (six) hours as needed for nausea or vomiting. 30 tablet 3   topiramate  (TOPAMAX ) 25 MG tablet Take 1 tablet (25 mg total) by mouth at bedtime for 7 days, THEN 2 tablets (50 mg total) daily. 60 tablet 3   No current facility-administered medications on file prior to visit.     ALLERGIES: Zofran  [ondansetron  hcl], Droperidol , Ketorolac , Peanut-containing drug products, Tomato, Zofran , Haloperidol , and Other   Review of Systems  Constitutional:  Negative for chills and fever.  Genitourinary: Negative.     PHYSICAL EXAMINATION:    BP 110/88   Pulse 89   Ht 5' 3 (1.6 m) Comment: Reported  Wt 167 lb 3.2 oz (75.8 kg)   LMP 10/19/2019   BMI 29.62 kg/m     General appearance: alert, cooperative and appears stated age Breasts: normal appearance, no masses or tenderness, Inspection negative, No nipple retraction or dimpling, No nipple discharge or bleeding, No axillary or supraclavicular adenopathy, Normal to palpation without dominant masses Abdomen: soft, non-tender; bowel sounds normal; no masses,  no organomegaly Lymph:  no inguinal LAD noted  Pelvic: External genitalia:  no lesions              Urethra:  normal appearing urethra with no masses, tenderness or lesions              Bartholins and Skenes: normal                 Vagina: normal mucosa without prolapse or lesions              Cervix: absent              Bimanual Exam:  Uterus:  uterus absent  Adnexa: no mass, fullness, tenderness              Rectovaginal: No..  Confirms.              Anus:  normal sphincter tone, no lesions  Chaperone,  CMA, was present for exam.  Assessment/Plan: 1. Screening examination for STD (sexually transmitted disease) (Primary) - Cervicovaginal ancillary only( [address]) - HIV antibody (with reflex) - RPR - Hepatitis B Surface AntiGEN - Hepatitis C Antibody  2. Vaginal discharge - Cervicovaginal ancillary only( [place])  3. Exposure to chlamydia - azithromycin  (ZITHROMAX ) 500 MG tablet; Take 2 tablets (1,000 mg total) by mouth once for 1 dose.  Dispense: 2 tablet; Refill: 0  Annual Gyn Exam - Self breast awareness encouraged - RTO 1 year for annual gyn exam - Declines Flu Vaccine  Arland POUR Dorothie Wah

## 2024-03-25 LAB — HEPATITIS B SURFACE ANTIGEN: Hepatitis B Surface Ag: NEGATIVE

## 2024-03-25 LAB — CERVICOVAGINAL ANCILLARY ONLY
Bacterial Vaginitis (gardnerella): POSITIVE — AB
Candida Glabrata: NEGATIVE
Candida Vaginitis: NEGATIVE
Chlamydia: NEGATIVE
Comment: NEGATIVE
Comment: NEGATIVE
Comment: NEGATIVE
Comment: NEGATIVE
Comment: NEGATIVE
Comment: NORMAL
Neisseria Gonorrhea: NEGATIVE
Trichomonas: NEGATIVE

## 2024-03-25 LAB — HEPATITIS C ANTIBODY: Hep C Virus Ab: NONREACTIVE

## 2024-03-25 LAB — RPR: RPR Ser Ql: NONREACTIVE

## 2024-03-25 LAB — HIV ANTIBODY (ROUTINE TESTING W REFLEX): HIV Screen 4th Generation wRfx: NONREACTIVE

## 2024-03-26 ENCOUNTER — Ambulatory Visit (HOSPITAL_BASED_OUTPATIENT_CLINIC_OR_DEPARTMENT_OTHER): Payer: Self-pay | Admitting: Certified Nurse Midwife

## 2024-03-27 ENCOUNTER — Other Ambulatory Visit (HOSPITAL_BASED_OUTPATIENT_CLINIC_OR_DEPARTMENT_OTHER): Payer: Self-pay

## 2024-03-27 MED ORDER — METRONIDAZOLE 500 MG PO TABS: 500.0000 mg | ORAL_TABLET | Freq: Two times a day (BID) | ORAL | 0 refills | Status: DC

## 2024-03-27 NOTE — Progress Notes (Signed)
 Prescription sent for Flagyl  x 7 days po per protocol.   Morna LOISE Quale, RN

## 2024-03-31 ENCOUNTER — Ambulatory Visit: Payer: MEDICAID | Admitting: Neurology

## 2024-04-01 NOTE — ED Provider Notes (Signed)
 History   Chief Complaint   Abdominal Pain; Emesis; Leg Pain    38 year old female complains of nausea vomiting diarrhea with abdominal pain.  She was in a car that was bouncing up and down a lot and she started feeling sick.    History of Crohn's disease.  No fever.  Denies hematemesis or blood in her stool.    Brought in by EMS and received fentanyl  x2.  History provided by: patient.  Nursing note reviewed and I agree with the documentation of the past medical, past surgical, social, and family histories.Vitals reviewed. Abdominal Pain Associated symptoms: diarrhea, nausea and vomiting   Associated symptoms: no chills and no fever   The primary symptoms of the illness include abdominal pain, nausea, vomiting and diarrhea. The primary symptoms of the illness do not include fever.  Symptoms associated with the illness do not include chills or back pain.    Past Medical History:  Diagnosis Date  . Crohn's disease (HCC)   . Hypertension     Past Surgical History:  Procedure Laterality Date  . HERNIA REPAIR    . PARTIAL HYSTERECTOMY      History reviewed. No pertinent family history.  Social History   Tobacco Use  . Smoking status: Every Day    Types: Cigarettes  . Smokeless tobacco: Never  Vaping Use  . Vaping status: Never Used  Substance Use Topics  . Alcohol use: Never  . Drug use: Never    Allergies: Zofran  [ondansetron  hcl], Adderall [dextroamphetamine-amphetamine], Compazine  [prochlorperazine ], and Reglan  [metoclopramide ]   New Prescriptions   PROMETHAZINE  (PHENERGAN ) 25 MG TABLET    Take 1 tablet (25 mg total) by mouth every 8 (eight) hours as needed for nausea for up to 7 days      Order Dose: 25 mg      Quantity: 15 tablet    Refills: 0       Review of Systems  Constitutional:  Negative for chills and fever.  Respiratory:  Negative for chest tightness.   Gastrointestinal:  Positive for abdominal pain, diarrhea, nausea and vomiting.   Genitourinary:  Negative for flank pain.  Musculoskeletal:  Negative for back pain.  Neurological:  Negative for dizziness.    Physical Exam   Vital signs upon initiating note BP (!) 157/121  Pulse 97  Temp 97.5 F (36.4 C)  Resp 18  Ht 63 (1.6 m)  Wt 72.6 kg (160 lb)  SpO2 100%  BMI 28.34 kg/m     Physical Exam Vitals and nursing note reviewed.  Constitutional:      Appearance: Normal appearance.  Cardiovascular:     Rate and Rhythm: Normal rate.     Pulses: Normal pulses.  Pulmonary:     Effort: Pulmonary effort is normal.     Breath sounds: Normal breath sounds.  Abdominal:     General: There is no distension.     Palpations: Abdomen is soft.     Tenderness: There is no abdominal tenderness. There is no guarding.  Musculoskeletal:        General: Normal range of motion.  Skin:    General: Skin is warm.     Capillary Refill: Capillary refill takes less than 2 seconds.  Neurological:     General: No focal deficit present.     Mental Status: She is alert and oriented to person, place, and time.     Cranial Nerves: No cranial nerve deficit.     Motor: No weakness.  Psychiatric:  Mood and Affect: Mood normal.     Lab Results:  Results for orders placed or performed during the hospital encounter of 04/01/24  Comprehensive Metabolic Panel   Specimen: Blood, Venous  Result Value Ref Range   Sodium 137 136 - 145 mmol/L   Potassium 3.5 3.5 - 5.1 mmol/L   Chloride 103 98 - 107 mmol/L   CO2 18 (L) 22 - 29 mmol/L   Glucose 155 (H) 70 - 99 mg/dL   BUN 20 6 - 20 mg/dL   Creatinine, S 9.04 (H) 0.50 - 0.90 mg/dL   Protein, Total 8.6 (H) 6.4 - 8.3 g/dL   Albumin, S 4.8 3.5 - 5.2 g/dL   Calcium, Total 89.7 (H) 8.6 - 10.0 mg/dL   Bilirubin, Total 0.4 0.0 - 1.2 mg/dL   Alkaline Phosphatase 93 35 - 104 IU/L   AST (SGOT) 25 0 - 32 IU/L   ALT (SGPT) 20 0 - 33 IU/L   Globulin 3.8 2.4 - 4.0 g/dL   Anion Gap 20 12 - 20   eGFR (No Race) 78 >59 ml/min/1.73 m2   Lipase   Specimen: Blood, Venous  Result Value Ref Range   Lipase 14 13 - 60 U/L  HCG, Quantitative   Specimen: Blood, Venous  Result Value Ref Range   HCG,Quantitative <1 (L) 1 - 4 mIU/mL  CBC w/Differential   Specimen: Blood, Venous  Result Value Ref Range   Wbc Count 17.05 (H) 3.50 - 10.50 10E9/L   Rbc Count 4.60 3.90 - 5.03 10E12/L   Hgb 14.3 12.0 - 15.5 g/dL   Hematocrit 57.8 64.9 - 45.0 %   MCV 91.5 82.0 - 98.0 fL   MCH 31.1 26.0 - 34.0 pg   MCHC 34.0 32.0 - 36.0 g/dL   RDW 87.4 88.0 - 84.4 %   Platelet 277 150 - 450 10E9/L   MPV 8.9 (L) 9.4 - 12.3 fL   % Immature Granulocytes 0.5 %   % Neutrophils 93.2 %   % Lymphs 3.6 %   % Monos 2.5 %   % Eos 0.0 %   % Basos 0.2 %   Absolute Immature Granulocytes 0.08 0.00 - 0.10 10E9/L   Absolute Neutrophils 15.89 (H) 1.70 - 7.00 10E9/L   Absolute Lymphs 0.62 (L) 1.50 - 4.00 10E9/L   Absolute Monos 0.43 0.30 - 0.90 10E9/L   Absolute Eosinophils <0.03 (L) 0.10 - 0.50 10E9/L   Absolute Baso 0.03 0.00 - 0.30 10E9/L   NRBCs 0.0 Clinical Reference Range Not Established. /100 WBCs       Imaging results:     ED Course     Procedures :  Medical Decision Making 38 year old female complains of generalized abdominal pain.  She has nausea vomiting diarrhea.  History of Crohn's disease.    No fever.    Abdominal exam slight benign.  White count is elevated at [address],   CMP shows mild dehydration with a bicarb of 18.    She was given fluids and Phenergan  feels much better.  She wants to go home.    Does not want to wait on the urinalysis.    I did re-examined her abdomen is benign.    She understands the risks pyelo.    And she is tolerating p.o. she will return if she gets worse.  Chemistries unremarkable  Problems Addressed: Crohn's disease without complication, unspecified gastrointestinal tract location [facility]): chronic illness or injury Generalized abdominal pain: acute illness or injury that poses a threat to life  or  bodily functions Nausea vomiting and diarrhea: acute illness or injury with systemic symptoms  Amount and/or Complexity of Data Reviewed Independent Historian: EMS    Details: EMS gave history of the 2 doses of fentanyl  she received Labs: ordered. Decision-making details documented in ED Course.  Risk Prescription drug management. Parenteral controlled substances. Decision regarding hospitalization.   :           No consult orders placed this encounter  Last four vital signs   04/01/24 1128 04/01/24 1215 04/01/24 1231  BP: (!) 157/121 135/76 128/88  Pulse: 97 90 84  Resp: 18 20 16   Temp: 97.5 F (36.4 C)    SpO2: 100% 100% 95%  Weight: 72.6 kg (160 lb)    Height: 63 (1.6 m)     Comment:     ED Disposition     ED Disposition  Discharge   Condition  Stable        ED Diagnoses     Diagnosis Description Comment Associated Orders     Final diagnoses   Generalized abdominal pain Generalized abdominal pain -- --   Nausea vomiting and diarrhea Nausea vomiting and diarrhea -- --   Crohn's disease without complication, unspecified gastrointestinal tract location [facility]) Crohn's disease without complication, unspecified gastrointestinal tract location [facility]) -- --      Follow-up Information     Follow up With Specialties Details Why Contact Info   Your GI doctor  In 1 week        Electronically signed by     Bertrand RAYMOND Blanch, MD 04/01/24 1256

## 2024-04-01 NOTE — ED Triage Notes (Signed)
 Pt BIB EMS from [facility] c/o [facility] and abd pn since this am. Pt rec'd Fentanyl  50mcg x2 ( ) PTA.  Hx of HTN, Chrohs. Also states bilateral leg pn

## 2024-04-01 NOTE — ED Notes (Signed)
 Reg at [facility], RN 04/01/24 [phone]

## 2024-04-03 ENCOUNTER — Encounter (HOSPITAL_BASED_OUTPATIENT_CLINIC_OR_DEPARTMENT_OTHER): Payer: Self-pay

## 2024-04-03 ENCOUNTER — Other Ambulatory Visit: Payer: Self-pay

## 2024-04-03 ENCOUNTER — Emergency Department (HOSPITAL_BASED_OUTPATIENT_CLINIC_OR_DEPARTMENT_OTHER)
Admission: EM | Admit: 2024-04-03 | Discharge: 2024-04-03 | Disposition: A | Discharge status: Home / Self Care | Payer: MEDICAID

## 2024-04-03 DIAGNOSIS — Z91010 Allergy to peanuts: Secondary | ICD-10-CM | POA: Insufficient documentation

## 2024-04-03 DIAGNOSIS — R1084 Generalized abdominal pain: Secondary | ICD-10-CM | POA: Insufficient documentation

## 2024-04-03 DIAGNOSIS — R1115 Cyclical vomiting syndrome unrelated to migraine: Secondary | ICD-10-CM | POA: Insufficient documentation

## 2024-04-03 DIAGNOSIS — R112 Nausea with vomiting, unspecified: Secondary | ICD-10-CM | POA: Diagnosis present

## 2024-04-03 LAB — URINALYSIS, ROUTINE W REFLEX MICROSCOPIC
Bilirubin Urine: NEGATIVE
Glucose, UA: NEGATIVE mg/dL
Ketones, ur: 80 mg/dL — AB
Leukocytes,Ua: NEGATIVE
Nitrite: NEGATIVE
Protein, ur: 30 mg/dL — AB
Specific Gravity, Urine: 1.025 (ref 1.005–1.030)
pH: 7.5 (ref 5.0–8.0)

## 2024-04-03 LAB — COMPREHENSIVE METABOLIC PANEL WITH GFR
ALT: 18 U/L (ref 0–44)
AST: 31 U/L (ref 15–41)
Albumin: 5 g/dL (ref 3.5–5.0)
Alkaline Phosphatase: 95 U/L (ref 38–126)
Anion gap: 21 — ABNORMAL HIGH (ref 5–15)
BUN: 21 mg/dL — ABNORMAL HIGH (ref 6–20)
CO2: 18 mmol/L — ABNORMAL LOW (ref 22–32)
Calcium: 10.3 mg/dL (ref 8.9–10.3)
Chloride: 100 mmol/L (ref 98–111)
Creatinine, Ser: 1.29 mg/dL — ABNORMAL HIGH (ref 0.44–1.00)
GFR, Estimated: 54 mL/min — ABNORMAL LOW (ref >60)
Glucose, Bld: 149 mg/dL — ABNORMAL HIGH (ref 70–99)
Potassium: 3 mmol/L — ABNORMAL LOW (ref 3.5–5.1)
Sodium: 138 mmol/L (ref 135–145)
Total Bilirubin: 0.8 mg/dL (ref 0.0–1.2)
Total Protein: 9.3 g/dL — ABNORMAL HIGH (ref 6.5–8.1)

## 2024-04-03 LAB — I-STAT VENOUS BLOOD GAS, ED
Acid-base deficit: 5 mmol/L — ABNORMAL HIGH (ref 0.0–2.0)
Bicarbonate: 19.5 mmol/L — ABNORMAL LOW (ref 20.0–28.0)
Calcium, Ion: 1.14 mmol/L — ABNORMAL LOW (ref 1.15–1.40)
HCT: 46 % (ref 36.0–46.0)
Hemoglobin: 15.6 g/dL — ABNORMAL HIGH (ref 12.0–15.0)
O2 Saturation: 100 %
Patient temperature: 98.2
Potassium: 3 mmol/L — ABNORMAL LOW (ref 3.5–5.1)
Sodium: 138 mmol/L (ref 135–145)
TCO2: 21 mmol/L — ABNORMAL LOW (ref 22–32)
pCO2, Ven: 33.2 mmHg — ABNORMAL LOW (ref 44–60)
pH, Ven: 7.376 (ref 7.25–7.43)
pO2, Ven: 203 mmHg — ABNORMAL HIGH (ref 32–45)

## 2024-04-03 LAB — LIPASE, BLOOD: Lipase: 17 U/L (ref 11–51)

## 2024-04-03 LAB — CBC
HCT: 44.4 % (ref 36.0–46.0)
Hemoglobin: 15.1 g/dL — ABNORMAL HIGH (ref 12.0–15.0)
MCH: 30.8 pg (ref 26.0–34.0)
MCHC: 34 g/dL (ref 30.0–36.0)
MCV: 90.6 fL (ref 80.0–100.0)
Platelets: 297 K/uL (ref 150–400)
RBC: 4.9 MIL/uL (ref 3.87–5.11)
RDW: 12.3 % (ref 11.5–15.5)
WBC: 13.5 K/uL — ABNORMAL HIGH (ref 4.0–10.5)
nRBC: 0 % (ref 0.0–0.2)

## 2024-04-03 LAB — BASIC METABOLIC PANEL WITH GFR
Anion gap: 15 (ref 5–15)
BUN: 17 mg/dL (ref 6–20)
CO2: 21 mmol/L — ABNORMAL LOW (ref 22–32)
Calcium: 9.2 mg/dL (ref 8.9–10.3)
Chloride: 101 mmol/L (ref 98–111)
Creatinine, Ser: 0.85 mg/dL (ref 0.44–1.00)
GFR, Estimated: >60 mL/min (ref >60)
Glucose, Bld: 92 mg/dL (ref 70–99)
Potassium: 3.5 mmol/L (ref 3.5–5.1)
Sodium: 138 mmol/L (ref 135–145)

## 2024-04-03 LAB — URINALYSIS, MICROSCOPIC (REFLEX)

## 2024-04-03 MED ORDER — PROMETHAZINE HCL 25 MG/ML IJ SOLN
INTRAMUSCULAR | Status: AC
  Filled 2024-04-03: qty 1

## 2024-04-03 MED ORDER — HYDROMORPHONE HCL 1 MG/ML IJ SOLN
0.5000 mg | Freq: Once | INTRAMUSCULAR | Status: AC
  Administered 2024-04-03: 0.5 mg via INTRAVENOUS
  Filled 2024-04-03: qty 1

## 2024-04-03 MED ORDER — DIPHENHYDRAMINE HCL 50 MG/ML IJ SOLN
25.0000 mg | Freq: Once | INTRAMUSCULAR | Status: AC
  Administered 2024-04-03: 25 mg via INTRAVENOUS
  Filled 2024-04-03: qty 1

## 2024-04-03 MED ORDER — LORAZEPAM 2 MG/ML IJ SOLN
1.0000 mg | Freq: Once | INTRAMUSCULAR | Status: AC
  Administered 2024-04-03: 1 mg via INTRAVENOUS
  Filled 2024-04-03: qty 1

## 2024-04-03 MED ORDER — SODIUM CHLORIDE 0.9 % IV SOLN
12.5000 mg | Freq: Once | INTRAVENOUS | Status: AC
  Administered 2024-04-03: 12.5 mg via INTRAVENOUS
  Filled 2024-04-03: qty 0.5

## 2024-04-03 MED ORDER — POTASSIUM CHLORIDE CRYS ER 20 MEQ PO TBCR
40.0000 meq | EXTENDED_RELEASE_TABLET | Freq: Once | ORAL | Status: AC
  Administered 2024-04-03: 40 meq via ORAL
  Filled 2024-04-03: qty 2

## 2024-04-03 MED ORDER — LACTATED RINGERS IV BOLUS
1000.0000 mL | Freq: Once | INTRAVENOUS | Status: AC
  Administered 2024-04-03: 1000 mL via INTRAVENOUS

## 2024-04-03 MED ORDER — PROMETHAZINE HCL 25 MG/ML IJ SOLN
25.0000 mg | Freq: Once | INTRAMUSCULAR | Status: AC
  Administered 2024-04-03: 25 mg via INTRAVENOUS
  Filled 2024-04-03: qty 1

## 2024-04-03 MED ORDER — HYDROMORPHONE HCL 1 MG/ML IJ SOLN: 1.0000 mg | Freq: Once | INTRAMUSCULAR | Status: DC

## 2024-04-03 NOTE — ED Notes (Addendum)
 Pt's IV infiltrated with LR. IV removed. Provider notified.

## 2024-04-03 NOTE — ED Provider Notes (Signed)
 Patient care was taken over from Dr. Neysa.  Patient has a history of cyclical vomiting syndrome.  She presented with a similar presentation.  Her abdominal pain was similar to prior presentations so Dr. Neysa had elected not to reimage her belly as she has had multiple CT scans in the past.  Labs showed an elevated creatinine and some hypokalemia.  She was given IV fluids along with pain medication and antiemetics.  She was fairly adamant about not wanting to be admitted.  Given this her repeat BMP was checked.  This showed that her creatinine and her potassium have both normalized.  She had to have 1 more repeat dose of medications here but now is feeling better.  No ongoing vomiting.  She is not wanting to be admitted.  I did offer her admission but she declined.  She was discharged home in good condition.  Was encouraged to follow-up with her doctor.  Return precautions were given.   Lenor Hollering, MD 04/03/24 [phone]

## 2024-04-03 NOTE — ED Provider Notes (Signed)
 [facility] Provider Note   CSN: [id number] Arrival date & time: 04/03/24  1101     Patient presents with: Abdominal Pain   Sherry Key is a 38 y.o. female.   38 year old female presenting with abdominal pain; generalized and having nausea vomiting x 3 days.  Similar to prior episodes for which she has been evaluated in the emergency department.  Unable to control nausea vomiting with Phenergan  at home.  No new symptoms or complaints.  Denies THC use.   Abdominal Pain      Prior to Admission medications   Medication Sig Start Date End Date Taking? Authorizing Provider  acetaminophen  (TYLENOL ) 500 MG tablet Take 500 mg by mouth every 6 (six) hours as needed for moderate pain or mild pain.    [provider]  chlorthalidone  (THALITONE ) 15 MG tablet Take 1 tablet (15 mg total) by mouth daily. 03/05/24   Knute Thersia Bitters, FNP  clotrimazole -betamethasone  (LOTRISONE ) cream Apply 1 Application topically daily. 01/02/24   Janit Thresa HERO, DPM  dicyclomine  (BENTYL ) 20 MG tablet Take 1 tablet (20 mg total) by mouth 2 (two) times daily. 03/05/24   Caudle, Thersia Bitters, FNP  diphenhydrAMINE  (BENADRYL ) 25 MG tablet Take 25 mg by mouth every 6 (six) hours as needed for allergies.    [provider]  fluconazole  (DIFLUCAN ) 150 MG tablet Take 1 tablet (150 mg total) by mouth every other day as needed. 03/24/24   Lo, Arland POUR, CNM  metroNIDAZOLE  (FLAGYL ) 500 MG tablet Take 1 tablet (500 mg total) by mouth 2 (two) times daily. 03/27/24   Lo, Arland POUR, CNM  Multiple Vitamins-Minerals (CERTAVITE/ANTIOXIDANTS) TABS Take 1 tablet by mouth daily. 01/20/23   Regalado, Belkys A, MD  promethazine  (PHENERGAN ) 25 MG suppository Place 1 suppository (25 mg total) rectally every 6 (six) hours as needed for nausea or vomiting. 02/01/24   Neysa Caron PARAS, DO  promethazine  (PHENERGAN ) 25 MG tablet Take 1 tablet (25 mg total) by mouth every 6 (six)  hours as needed for nausea or vomiting. 03/05/24   Caudle, Thersia Bitters, FNP  topiramate  (TOPAMAX ) 25 MG tablet Take 1 tablet (25 mg total) by mouth at bedtime for 7 days, THEN 2 tablets (50 mg total) daily. 03/05/24 04/11/24  Knute Thersia Bitters, FNP    Allergies: Zofran  [ondansetron  hcl], Droperidol , Ketorolac , Peanut-containing drug products, Tomato, Prochlorperazine , Reglan  [metoclopramide ], Zofran , Haloperidol , and Other    Review of Systems  Gastrointestinal:  Positive for abdominal pain.    Updated Vital Signs BP (!) 153/110 (BP Location: Right Arm)   Pulse (!) 110   Temp 98.2 F (36.8 C) (Oral)   Resp 20   Ht 5' 3 (1.6 m)   Wt 72.6 kg   LMP 10/19/2019   SpO2 100%   BMI 28.34 kg/m   Physical Exam Vitals and nursing note reviewed.  Cardiovascular:     Rate and Rhythm: Normal rate.  Pulmonary:     Effort: Pulmonary effort is normal.  Abdominal:     Palpations: Abdomen is soft.     Tenderness: There is generalized abdominal tenderness (mild).  Skin:    General: Skin is warm and dry.     Capillary Refill: Capillary refill takes less than 2 seconds.  Neurological:     Mental Status: She is alert and oriented to person, place, and time.  Psychiatric:        Mood and Affect: Mood normal.        Behavior:  Behavior normal.     (all labs ordered are listed, but only abnormal results are displayed) Labs Reviewed  COMPREHENSIVE METABOLIC PANEL WITH GFR - Abnormal; Notable for the following components:      Result Value   Potassium 3.0 (*)    CO2 18 (*)    Glucose, Bld 149 (*)    BUN 21 (*)    Creatinine, Ser 1.29 (*)    Total Protein 9.3 (*)    GFR, Estimated 54 (*)    Anion gap 21 (*)    All other components within normal limits  CBC - Abnormal; Notable for the following components:   WBC 13.5 (*)    Hemoglobin 15.1 (*)    All other components within normal limits  I-STAT VENOUS BLOOD GAS, ED - Abnormal; Notable for the following components:   pCO2, Ven 33.2  (*)    pO2, Ven 203 (*)    Bicarbonate 19.5 (*)    TCO2 21 (*)    Acid-base deficit 5.0 (*)    Potassium 3.0 (*)    Calcium, Ion 1.14 (*)    Hemoglobin 15.6 (*)    All other components within normal limits  LIPASE, BLOOD  URINALYSIS, ROUTINE W REFLEX MICROSCOPIC    EKG: EKG Interpretation Date/Time:  Thursday April 03 2024 11:11:16 EDT Ventricular Rate:  134 PR Interval:  141 QRS Duration:  91 QT Interval:  309 QTC Calculation: 462 R Axis:   94  Text Interpretation: Sinus tachycardia Consider right atrial enlargement Borderline right axis deviation Consider left ventricular hypertrophy ST depr, consider ischemia, inferior leads Baseline wander in lead(s) II III aVF Confirmed by Neysa Clap [phone]) on 04/03/2024 11:32:39 AM  Radiology: No results found.   Procedures   Medications Ordered in the ED  promethazine  (PHENERGAN ) 25 MG/ML injection (  Not Given 04/03/24 1213)  potassium chloride  SA (KLOR-CON  M) CR tablet 40 mEq (40 mEq Oral Patient Refused/Not Given 04/03/24 1358)  lactated ringers  bolus 1,000 mL (0 mLs Intravenous Stopped 04/03/24 1233)  promethazine  (PHENERGAN ) 25 mg in sodium chloride  0.9 % 50 mL IVPB (0 mg Intravenous Stopped 04/03/24 1232)  diphenhydrAMINE  (BENADRYL ) injection 25 mg (25 mg Intravenous Given 04/03/24 1205)  HYDROmorphone  (DILAUDID ) injection 0.5 mg (0.5 mg Intravenous Given 04/03/24 1205)  LORazepam  (ATIVAN ) injection 1 mg (1 mg Intravenous Given 04/03/24 1341)  lactated ringers  bolus 1,000 mL (0 mLs Intravenous Stopped 04/03/24 1421)  HYDROmorphone  (DILAUDID ) injection 0.5 mg (0.5 mg Intravenous Given 04/03/24 1540)    Clinical Course as of 04/03/24 1552  Thu Apr 03, 2024  1214 CBC(!) Chronic leukocytosis; appears improved compared to prior.  [TY]  1548 Comprehensive metabolic panel(!) Consistent with patient's history of nausea and vomiting.  Receiving IV fluids. [TY]  1548 I-Stat venous blood gas, ([facility] ED, [facility], [facility])(!) Not DKA [TY]  1549  Patient is no longer vomiting and is feeling improved her medications.  I offered admission for her metabolic derangements and IV fluids, but patient declined and states that she is feeling improved and would like to go after she gets IV fluids. [TY]    Clinical Course User Index [TY] Neysa Clap PARAS, DO                                 Medical Decision Making This is a 38 year old female presenting emergency department for abdominal pain nausea vomiting.  Per chart review does have history of cyclical vomiting, cannabinoid hyperemesis,  has documented on medical history of Crohn's disease, but there is a another note saying that she has never had an actual biopsy to prove that she has Crohn's disease.  Patient is retching on my exam.  Soft abdomen with mild diffuse tenderness.  She has had numerous negative CT scans within the past 6 months.  Her presentation today consistent with her cyclical vomiting.  Will get screening labs, treat with Phenergan , IV fluids.  Will also give dose of pain medication.  See ed course for further mdm/disposition.   Amount and/or Complexity of Data Reviewed External Data Reviewed: radiology.    Details:  CT abdomen in July with IMPRESSION: 1. No acute findings. 2. Status post cholecystectomy and hysterectomy.  Labs: ordered. Decision-making details documented in ED Course. Radiology:     Details: Considered CT scan, but with several recent reassuring CT scans for similar type presentation low suspicion for acute intra-abdominal pathology at this time and symptoms likely secondary to patient's cyclical vomiting/cannabinoid hyperemesis ECG/medicine tests:     Details: No QTc prolongation  Risk Prescription drug management. Decision regarding hospitalization. Diagnosis or treatment significantly limited by social determinants of health. Risk Details: Poor health literacy       Final diagnoses:  None    ED Discharge Orders     None           Neysa Caron PARAS, DO 04/03/24 1552

## 2024-04-03 NOTE — ED Notes (Signed)
 Pt denies any chance of pregnancy. Pt refusing urine hcg test prior to medication administration.

## 2024-04-03 NOTE — ED Triage Notes (Signed)
 Reports generalized abd pain and N/V/D since Monday.  Seen in ED out of state earlier this week for same symptoms.   Diaphoretic and actively throwing up in triage

## 2024-04-03 NOTE — ED Notes (Signed)
 Pt aware of necessity of urine sample.

## 2024-04-11 ENCOUNTER — Other Ambulatory Visit: Payer: Self-pay

## 2024-04-11 ENCOUNTER — Other Ambulatory Visit (HOSPITAL_BASED_OUTPATIENT_CLINIC_OR_DEPARTMENT_OTHER): Payer: Self-pay

## 2024-04-12 ENCOUNTER — Other Ambulatory Visit (HOSPITAL_BASED_OUTPATIENT_CLINIC_OR_DEPARTMENT_OTHER): Payer: Self-pay

## 2024-05-02 ENCOUNTER — Ambulatory Visit (HOSPITAL_BASED_OUTPATIENT_CLINIC_OR_DEPARTMENT_OTHER): Payer: MEDICAID | Admitting: Family Medicine

## 2024-05-04 ENCOUNTER — Emergency Department (HOSPITAL_BASED_OUTPATIENT_CLINIC_OR_DEPARTMENT_OTHER)
Admission: EM | Admit: 2024-05-04 | Discharge: 2024-05-04 | Disposition: A | Discharge status: Home / Self Care | Payer: MEDICAID | Attending: Emergency Medicine | Admitting: Emergency Medicine

## 2024-05-04 ENCOUNTER — Encounter (HOSPITAL_BASED_OUTPATIENT_CLINIC_OR_DEPARTMENT_OTHER): Payer: Self-pay | Admitting: Emergency Medicine

## 2024-05-04 ENCOUNTER — Other Ambulatory Visit: Payer: Self-pay

## 2024-05-04 DIAGNOSIS — I10 Essential (primary) hypertension: Secondary | ICD-10-CM | POA: Diagnosis not present

## 2024-05-04 DIAGNOSIS — R112 Nausea with vomiting, unspecified: Secondary | ICD-10-CM | POA: Insufficient documentation

## 2024-05-04 DIAGNOSIS — R10817 Generalized abdominal tenderness: Secondary | ICD-10-CM | POA: Insufficient documentation

## 2024-05-04 DIAGNOSIS — R1115 Cyclical vomiting syndrome unrelated to migraine: Secondary | ICD-10-CM

## 2024-05-04 DIAGNOSIS — R1110 Vomiting, unspecified: Secondary | ICD-10-CM | POA: Diagnosis present

## 2024-05-04 DIAGNOSIS — Z91010 Allergy to peanuts: Secondary | ICD-10-CM | POA: Diagnosis not present

## 2024-05-04 LAB — URINALYSIS, ROUTINE W REFLEX MICROSCOPIC
Bacteria, UA: NONE SEEN
Bilirubin Urine: NEGATIVE
Glucose, UA: NEGATIVE mg/dL
Ketones, ur: 15 mg/dL — AB
Leukocytes,Ua: NEGATIVE
Nitrite: NEGATIVE
Specific Gravity, Urine: 1.012 (ref 1.005–1.030)
pH: 6 (ref 5.0–8.0)

## 2024-05-04 LAB — COMPREHENSIVE METABOLIC PANEL WITH GFR
ALT: 14 U/L (ref 0–44)
AST: 26 U/L (ref 15–41)
Albumin: 5 g/dL (ref 3.5–5.0)
Alkaline Phosphatase: 94 U/L (ref 38–126)
Anion gap: 19 — ABNORMAL HIGH (ref 5–15)
BUN: 30 mg/dL — ABNORMAL HIGH (ref 6–20)
CO2: 21 mmol/L — ABNORMAL LOW (ref 22–32)
Calcium: 10.4 mg/dL — ABNORMAL HIGH (ref 8.9–10.3)
Chloride: 98 mmol/L (ref 98–111)
Creatinine, Ser: 1.54 mg/dL — ABNORMAL HIGH (ref 0.44–1.00)
GFR, Estimated: 44 mL/min — ABNORMAL LOW (ref >60)
Glucose, Bld: 137 mg/dL — ABNORMAL HIGH (ref 70–99)
Potassium: 3 mmol/L — ABNORMAL LOW (ref 3.5–5.1)
Sodium: 137 mmol/L (ref 135–145)
Total Bilirubin: 0.7 mg/dL (ref 0.0–1.2)
Total Protein: 9.2 g/dL — ABNORMAL HIGH (ref 6.5–8.1)

## 2024-05-04 LAB — CBC
HCT: 41.9 % (ref 36.0–46.0)
Hemoglobin: 14.5 g/dL (ref 12.0–15.0)
MCH: 31.3 pg (ref 26.0–34.0)
MCHC: 34.6 g/dL (ref 30.0–36.0)
MCV: 90.5 fL (ref 80.0–100.0)
Platelets: 332 K/uL (ref 150–400)
RBC: 4.63 MIL/uL (ref 3.87–5.11)
RDW: 12.7 % (ref 11.5–15.5)
WBC: 19.4 K/uL — ABNORMAL HIGH (ref 4.0–10.5)
nRBC: 0 % (ref 0.0–0.2)

## 2024-05-04 LAB — LIPASE, BLOOD: Lipase: 13 U/L (ref 11–51)

## 2024-05-04 MED ORDER — SODIUM CHLORIDE 0.9 % IV SOLN
12.5000 mg | Freq: Four times a day (QID) | INTRAVENOUS | Status: DC | PRN
  Administered 2024-05-04: 12.5 mg via INTRAVENOUS
  Filled 2024-05-04: qty 0.5

## 2024-05-04 MED ORDER — SODIUM CHLORIDE 0.9 % IV SOLN
12.5000 mg | Freq: Once | INTRAVENOUS | Status: DC
Start: 2024-05-04 — End: 2024-05-04
  Filled 2024-05-04: qty 0.5

## 2024-05-04 MED ORDER — HYDROMORPHONE HCL 1 MG/ML IJ SOLN
0.5000 mg | Freq: Once | INTRAMUSCULAR | Status: AC
  Administered 2024-05-04: 0.5 mg via INTRAVENOUS
  Filled 2024-05-04: qty 1

## 2024-05-04 MED ORDER — SODIUM CHLORIDE 0.9% FLUSH: 3.0000 mL | Freq: Once | INTRAVENOUS | Status: DC

## 2024-05-04 MED ORDER — PROMETHAZINE HCL 25 MG/ML IJ SOLN
INTRAMUSCULAR | Status: AC
  Filled 2024-05-04: qty 1

## 2024-05-04 MED ORDER — SODIUM CHLORIDE 0.9 % IV BOLUS
1000.0000 mL | Freq: Once | INTRAVENOUS | Status: AC
  Administered 2024-05-04: 1000 mL via INTRAVENOUS

## 2024-05-04 MED ORDER — PROMETHAZINE HCL 25 MG/ML IJ SOLN
INTRAMUSCULAR | Status: AC
  Administered 2024-05-04: 12.5 mg
  Filled 2024-05-04: qty 1

## 2024-05-04 NOTE — ED Notes (Signed)
 Patient given discharge instructions. Questions were answered. Patient verbalized understanding of discharge instructions and care at home.

## 2024-05-04 NOTE — ED Triage Notes (Signed)
 Reports nonstop vomiting since Friday. Hx of CHS. Denies fevers.

## 2024-05-04 NOTE — Discharge Instructions (Signed)
 Continue home medications as previously prescribed.  Follow-up with primary doctor and return to the ER if symptoms significantly worsen or change.

## 2024-05-04 NOTE — ED Provider Notes (Signed)
 [place] EMERGENCY DEPARTMENT AT [facility] Provider Note   CSN: [id number] Arrival date & time: 05/04/24  9188     Patient presents with: Emesis   Sherry Key is a 38 y.o. female.   Patient is a 38 year old female with past medical history of cyclic vomiting and frequent ED visits, hypertension.  Patient presenting today with complaints of abdominal pain, nausea, and vomiting.  Symptoms started 2 days ago and have been worsening.  She has been unable to take her home medications secondary to this.  No fevers or chills.  Her abdominal pain is generalized and crampy in nature.  She does report some diarrhea, but no bloody stool or vomit.       Prior to Admission medications   Medication Sig Start Date End Date Taking? Authorizing Provider  acetaminophen  (TYLENOL ) 500 MG tablet Take 500 mg by mouth every 6 (six) hours as needed for moderate pain or mild pain.    [provider]  chlorthalidone  (THALITONE ) 15 MG tablet Take 1 tablet (15 mg total) by mouth daily. 03/05/24   Caudle, Thersia Bitters, FNP  clotrimazole -betamethasone  (LOTRISONE ) cream Apply 1 Application topically daily. 01/02/24   Janit Thresa HERO, DPM  dicyclomine  (BENTYL ) 20 MG tablet Take 1 tablet (20 mg total) by mouth 2 (two) times daily. 03/05/24   Caudle, Thersia Bitters, FNP  diphenhydrAMINE  (BENADRYL ) 25 MG tablet Take 25 mg by mouth every 6 (six) hours as needed for allergies.    [provider]  fluconazole  (DIFLUCAN ) 150 MG tablet Take 1 tablet (150 mg total) by mouth every other day as needed. 03/24/24   Lo, Arland POUR, CNM  metroNIDAZOLE  (FLAGYL ) 500 MG tablet Take 1 tablet (500 mg total) by mouth 2 (two) times daily. 03/27/24   Lo, Arland POUR, CNM  Multiple Vitamins-Minerals (CERTAVITE/ANTIOXIDANTS) TABS Take 1 tablet by mouth daily. 01/20/23   Regalado, Owen A, MD  promethazine  (PHENERGAN ) 25 MG suppository Place 1 suppository (25 mg total) rectally every 6 (six) hours as needed for nausea  or vomiting. 02/01/24   Neysa Caron PARAS, DO  promethazine  (PHENERGAN ) 25 MG tablet Take 1 tablet (25 mg total) by mouth every 6 (six) hours as needed for nausea or vomiting. 03/05/24   Caudle, Thersia Bitters, FNP  topiramate  (TOPAMAX ) 25 MG tablet Take 1 tablet (25 mg total) by mouth at bedtime for 7 days, THEN 2 tablets (50 mg total) daily. 03/05/24 04/22/24  CaudleThersia Bitters, FNP    Allergies: Zofran  [ondansetron  hcl], Droperidol , Ketorolac , Peanut-containing drug products, Tomato, Prochlorperazine , Reglan  [metoclopramide ], Zofran , Haloperidol , and Other    Review of Systems  All other systems reviewed and are negative.   Updated Vital Signs BP (!) 144/98 (BP Location: Right Arm)   Pulse (!) 102   Temp 98.7 F (37.1 C) (Oral)   Resp 18   LMP 10/19/2019   SpO2 100%   Physical Exam Vitals and nursing note reviewed.  Constitutional:      General: She is not in acute distress.    Appearance: She is well-developed. She is not diaphoretic.  HENT:     Head: Normocephalic and atraumatic.  Cardiovascular:     Rate and Rhythm: Normal rate and regular rhythm.     Heart sounds: No murmur heard.    No friction rub. No gallop.  Pulmonary:     Effort: Pulmonary effort is normal. No respiratory distress.     Breath sounds: Normal breath sounds. No wheezing.  Abdominal:     General: Bowel  sounds are normal. There is no distension.     Palpations: Abdomen is soft.     Tenderness: There is abdominal tenderness. There is no guarding or rebound.     Comments: There is generalized abdominal tenderness.  Musculoskeletal:        General: Normal range of motion.     Cervical back: Normal range of motion and neck supple.  Skin:    General: Skin is warm and dry.  Neurological:     General: No focal deficit present.     Mental Status: She is alert and oriented to person, place, and time.     (all labs ordered are listed, but only abnormal results are displayed) Labs Reviewed  CBC -  Abnormal; Notable for the following components:      Result Value   WBC 19.4 (*)    All other components within normal limits  LIPASE, BLOOD  COMPREHENSIVE METABOLIC PANEL WITH GFR  URINALYSIS, ROUTINE W REFLEX MICROSCOPIC    EKG: None  Radiology: No results found.   Procedures   Medications Ordered in the ED  sodium chloride  flush (NS) 0.9 % injection 3 mL ( Intravenous Canceled Entry 05/04/24 0831)  sodium chloride  0.9 % bolus 1,000 mL (has no administration in time range)  HYDROmorphone  (DILAUDID ) injection 0.5 mg (has no administration in time range)  promethazine  (PHENERGAN ) 12.5 mg in sodium chloride  0.9 % 50 mL IVPB (has no administration in time range)                                    Medical Decision Making Amount and/or Complexity of Data Reviewed Labs: ordered.  Risk Prescription drug management.   Patient is a 38 year old female with history of cyclic vomiting/cannabis hyperemesis presenting with nausea, vomiting, and abdominal pain.  Patient well-known to the emergency department with similar visits in the past.  She arrives here with stable vital signs and is afebrile.  She does have generalized abdominal tenderness, but no peritoneal signs on exam.  Laboratory studies obtained including CBC, CMP, and lipase.  She has a white count of 19,000 along with a slight bump in her creatinine to 1.5, but laboratory studies otherwise unremarkable.  Patient has received 2 L of normal saline along with Phenergan  and Dilaudid  and is feeling markedly improved.  She is now tolerating p.o. and feels much better.  I feel as though patient can safely be discharged.  I doubt an acute intra-abdominal process as she has had multiple presentations in the past similar to this.  Patient to be discharged with continued use of home medications and as needed return.  Do not feel as though any imaging studies are indicated given the dramatic improvement in response to the medications.      Final diagnoses:  None    ED Discharge Orders     None          Geroldine Berg, MD 05/04/24 1146

## 2024-05-09 ENCOUNTER — Ambulatory Visit: Payer: MEDICAID | Admitting: Neurology

## 2024-05-09 ENCOUNTER — Encounter: Payer: Self-pay | Admitting: Neurology

## 2024-05-12 ENCOUNTER — Other Ambulatory Visit: Payer: Self-pay

## 2024-05-12 ENCOUNTER — Encounter: Payer: Self-pay | Admitting: Radiology

## 2024-05-12 ENCOUNTER — Inpatient Hospital Stay (HOSPITAL_BASED_OUTPATIENT_CLINIC_OR_DEPARTMENT_OTHER)
Admission: EM | Admit: 2024-05-12 | Discharge: 2024-05-14 | DRG: 392 | Disposition: A | Discharge status: Home / Self Care | Payer: MEDICAID | Attending: Internal Medicine | Admitting: Internal Medicine

## 2024-05-12 DIAGNOSIS — Z79899 Other long term (current) drug therapy: Secondary | ICD-10-CM

## 2024-05-12 DIAGNOSIS — F1220 Cannabis dependence, uncomplicated: Secondary | ICD-10-CM | POA: Diagnosis present

## 2024-05-12 DIAGNOSIS — I10 Essential (primary) hypertension: Secondary | ICD-10-CM | POA: Diagnosis present

## 2024-05-12 DIAGNOSIS — N179 Acute kidney failure, unspecified: Secondary | ICD-10-CM | POA: Diagnosis not present

## 2024-05-12 DIAGNOSIS — K5090 Crohn's disease, unspecified, without complications: Secondary | ICD-10-CM | POA: Diagnosis present

## 2024-05-12 DIAGNOSIS — E860 Dehydration: Secondary | ICD-10-CM | POA: Diagnosis present

## 2024-05-12 DIAGNOSIS — E282 Polycystic ovarian syndrome: Secondary | ICD-10-CM | POA: Insufficient documentation

## 2024-05-12 DIAGNOSIS — Z888 Allergy status to other drugs, medicaments and biological substances status: Secondary | ICD-10-CM

## 2024-05-12 DIAGNOSIS — R112 Nausea with vomiting, unspecified: Secondary | ICD-10-CM | POA: Diagnosis present

## 2024-05-12 DIAGNOSIS — R1116 Cannabis hyperemesis syndrome: Principal | ICD-10-CM | POA: Diagnosis present

## 2024-05-12 DIAGNOSIS — E876 Hypokalemia: Secondary | ICD-10-CM | POA: Diagnosis present

## 2024-05-12 DIAGNOSIS — R739 Hyperglycemia, unspecified: Secondary | ICD-10-CM | POA: Diagnosis present

## 2024-05-12 DIAGNOSIS — Z9049 Acquired absence of other specified parts of digestive tract: Secondary | ICD-10-CM

## 2024-05-12 DIAGNOSIS — G894 Chronic pain syndrome: Secondary | ICD-10-CM | POA: Diagnosis present

## 2024-05-12 DIAGNOSIS — F17210 Nicotine dependence, cigarettes, uncomplicated: Secondary | ICD-10-CM | POA: Diagnosis present

## 2024-05-12 DIAGNOSIS — Z91018 Allergy to other foods: Secondary | ICD-10-CM

## 2024-05-12 DIAGNOSIS — E8809 Other disorders of plasma-protein metabolism, not elsewhere classified: Secondary | ICD-10-CM | POA: Diagnosis present

## 2024-05-12 DIAGNOSIS — Z886 Allergy status to analgesic agent status: Secondary | ICD-10-CM

## 2024-05-12 DIAGNOSIS — Z889 Allergy status to unspecified drugs, medicaments and biological substances status: Secondary | ICD-10-CM

## 2024-05-12 DIAGNOSIS — Z90711 Acquired absence of uterus with remaining cervical stump: Secondary | ICD-10-CM

## 2024-05-12 DIAGNOSIS — Z765 Malingerer [conscious simulation]: Secondary | ICD-10-CM

## 2024-05-12 LAB — CBC
HCT: 40.9 % (ref 36.0–46.0)
Hemoglobin: 14.2 g/dL (ref 12.0–15.0)
MCH: 31.1 pg (ref 26.0–34.0)
MCHC: 34.7 g/dL (ref 30.0–36.0)
MCV: 89.5 fL (ref 80.0–100.0)
Platelets: 317 K/uL (ref 150–400)
RBC: 4.57 MIL/uL (ref 3.87–5.11)
RDW: 12.7 % (ref 11.5–15.5)
WBC: 13.3 K/uL — ABNORMAL HIGH (ref 4.0–10.5)
nRBC: 0 % (ref 0.0–0.2)

## 2024-05-12 LAB — URINALYSIS, ROUTINE W REFLEX MICROSCOPIC
Bacteria, UA: NONE SEEN
Bilirubin Urine: NEGATIVE
Glucose, UA: NEGATIVE mg/dL
Ketones, ur: 20 mg/dL — AB
Leukocytes,Ua: NEGATIVE
Nitrite: NEGATIVE
Protein, ur: NEGATIVE mg/dL
Specific Gravity, Urine: 1.017 (ref 1.005–1.030)
pH: 6 (ref 5.0–8.0)

## 2024-05-12 LAB — LIPASE, BLOOD: Lipase: 15 U/L (ref 11–51)

## 2024-05-12 LAB — URINE DRUG SCREEN
Amphetamines: NEGATIVE
Barbiturates: NEGATIVE
Benzodiazepines: POSITIVE — AB
Cocaine: NEGATIVE
Fentanyl: NEGATIVE
Methadone Scn, Ur: NEGATIVE
Opiates: NEGATIVE
Tetrahydrocannabinol: POSITIVE — AB

## 2024-05-12 LAB — COMPREHENSIVE METABOLIC PANEL WITH GFR
ALT: 12 U/L (ref 0–44)
AST: 21 U/L (ref 15–41)
Albumin: 5 g/dL (ref 3.5–5.0)
Alkaline Phosphatase: 77 U/L (ref 38–126)
Anion gap: 20 — ABNORMAL HIGH (ref 5–15)
BUN: 28 mg/dL — ABNORMAL HIGH (ref 6–20)
CO2: 23 mmol/L (ref 22–32)
Calcium: 10.3 mg/dL (ref 8.9–10.3)
Chloride: 95 mmol/L — ABNORMAL LOW (ref 98–111)
Creatinine, Ser: 1.36 mg/dL — ABNORMAL HIGH (ref 0.44–1.00)
GFR, Estimated: 51 mL/min — ABNORMAL LOW (ref >60)
Glucose, Bld: 147 mg/dL — ABNORMAL HIGH (ref 70–99)
Potassium: 2.5 mmol/L — CL (ref 3.5–5.1)
Sodium: 137 mmol/L (ref 135–145)
Total Bilirubin: 0.6 mg/dL (ref 0.0–1.2)
Total Protein: 8.9 g/dL — ABNORMAL HIGH (ref 6.5–8.1)

## 2024-05-12 LAB — BASIC METABOLIC PANEL WITH GFR
Anion gap: 13 (ref 5–15)
BUN: 17 mg/dL (ref 6–20)
CO2: 20 mmol/L — ABNORMAL LOW (ref 22–32)
Calcium: 8.3 mg/dL — ABNORMAL LOW (ref 8.9–10.3)
Chloride: 105 mmol/L (ref 98–111)
Creatinine, Ser: 0.88 mg/dL (ref 0.44–1.00)
GFR, Estimated: >60 mL/min (ref >60)
Glucose, Bld: 95 mg/dL (ref 70–99)
Potassium: 3.5 mmol/L (ref 3.5–5.1)
Sodium: 139 mmol/L (ref 135–145)

## 2024-05-12 LAB — MAGNESIUM: Magnesium: 2.2 mg/dL (ref 1.7–2.4)

## 2024-05-12 MED ORDER — LORAZEPAM 2 MG/ML IJ SOLN
2.0000 mg | Freq: Once | INTRAMUSCULAR | Status: AC
  Administered 2024-05-12: 2 mg via INTRAVENOUS
  Filled 2024-05-12: qty 1

## 2024-05-12 MED ORDER — FENTANYL CITRATE (PF) 50 MCG/ML IJ SOSY
25.0000 ug | PREFILLED_SYRINGE | Freq: Once | INTRAMUSCULAR | Status: AC
  Administered 2024-05-12: 25 ug via INTRAVENOUS
  Filled 2024-05-12: qty 1

## 2024-05-12 MED ORDER — SODIUM CHLORIDE 0.9 % IV SOLN
12.5000 mg | Freq: Four times a day (QID) | INTRAVENOUS | Status: DC | PRN
  Administered 2024-05-12 – 2024-05-14 (×6): 12.5 mg via INTRAVENOUS
  Filled 2024-05-12 (×4): qty 12.5
  Filled 2024-05-12 (×3): qty 0.5
  Filled 2024-05-12: qty 12.5
  Filled 2024-05-12: qty 0.5

## 2024-05-12 MED ORDER — POTASSIUM CHLORIDE 10 MEQ/100ML IV SOLN
10.0000 meq | INTRAVENOUS | Status: AC
  Administered 2024-05-12 (×4): 10 meq via INTRAVENOUS
  Filled 2024-05-12: qty 100

## 2024-05-12 MED ORDER — ACETAMINOPHEN 650 MG RE SUPP: 650.0000 mg | Freq: Four times a day (QID) | RECTAL | Status: DC | PRN

## 2024-05-12 MED ORDER — FENTANYL CITRATE (PF) 50 MCG/ML IJ SOSY
25.0000 ug | PREFILLED_SYRINGE | INTRAMUSCULAR | Status: DC | PRN
  Administered 2024-05-12 – 2024-05-13 (×3): 25 ug via INTRAVENOUS
  Filled 2024-05-12 (×3): qty 1

## 2024-05-12 MED ORDER — PROMETHAZINE HCL 25 MG/ML IJ SOLN
INTRAMUSCULAR | Status: AC
  Filled 2024-05-12: qty 1

## 2024-05-12 MED ORDER — SODIUM CHLORIDE 0.9 % IV BOLUS
1000.0000 mL | Freq: Once | INTRAVENOUS | Status: AC
  Administered 2024-05-12: 1000 mL via INTRAVENOUS

## 2024-05-12 MED ORDER — ENOXAPARIN SODIUM 40 MG/0.4ML IJ SOSY
40.0000 mg | PREFILLED_SYRINGE | INTRAMUSCULAR | Status: DC
  Administered 2024-05-12 – 2024-05-13 (×2): 40 mg via SUBCUTANEOUS
  Filled 2024-05-12 (×2): qty 0.4

## 2024-05-12 MED ORDER — POTASSIUM CHLORIDE IN NACL 20-0.9 MEQ/L-% IV SOLN
INTRAVENOUS | Status: DC
  Filled 2024-05-12 (×3): qty 1000

## 2024-05-12 MED ORDER — DICYCLOMINE HCL 10 MG/ML IM SOLN
20.0000 mg | Freq: Once | INTRAMUSCULAR | Status: AC
  Administered 2024-05-12: 20 mg via INTRAMUSCULAR
  Filled 2024-05-12: qty 2

## 2024-05-12 MED ORDER — AMLODIPINE BESYLATE 5 MG PO TABS
5.0000 mg | ORAL_TABLET | Freq: Every day | ORAL | Status: DC
  Administered 2024-05-12 – 2024-05-14 (×3): 5 mg via ORAL
  Filled 2024-05-12 (×3): qty 1

## 2024-05-12 MED ORDER — FAMOTIDINE IN NACL 20-0.9 MG/50ML-% IV SOLN
20.0000 mg | Freq: Once | INTRAVENOUS | Status: AC
  Administered 2024-05-12: 20 mg via INTRAVENOUS
  Filled 2024-05-12: qty 50

## 2024-05-12 MED ORDER — HYDRALAZINE HCL 20 MG/ML IJ SOLN: 10.0000 mg | INTRAMUSCULAR | Status: DC | PRN

## 2024-05-12 MED ORDER — POTASSIUM CHLORIDE IN NACL 20-0.9 MEQ/L-% IV SOLN
Freq: Once | INTRAVENOUS | Status: AC
  Filled 2024-05-12: qty 1000

## 2024-05-12 MED ORDER — POTASSIUM CHLORIDE CRYS ER 20 MEQ PO TBCR
20.0000 meq | EXTENDED_RELEASE_TABLET | Freq: Once | ORAL | Status: AC
  Administered 2024-05-12: 20 meq via ORAL
  Filled 2024-05-12: qty 1

## 2024-05-12 MED ORDER — ACETAMINOPHEN 325 MG PO TABS: 650.0000 mg | ORAL_TABLET | Freq: Four times a day (QID) | ORAL | Status: DC | PRN

## 2024-05-12 MED ORDER — PROMETHAZINE HCL 25 MG RE SUPP: 25.0000 mg | Freq: Four times a day (QID) | RECTAL | Status: DC | PRN

## 2024-05-12 MED ORDER — SODIUM CHLORIDE 0.45 % IV SOLN: INTRAVENOUS | Status: DC

## 2024-05-12 MED ORDER — MAGNESIUM SULFATE 2 GM/50ML IV SOLN
2.0000 g | Freq: Once | INTRAVENOUS | Status: AC
  Administered 2024-05-12: 2 g via INTRAVENOUS
  Filled 2024-05-12: qty 50

## 2024-05-12 MED ORDER — POTASSIUM CHLORIDE IN NACL 20-0.9 MEQ/L-% IV SOLN
INTRAVENOUS | Status: AC
  Filled 2024-05-12: qty 1000

## 2024-05-12 NOTE — H&P (Signed)
 History and Physical    Patient: Sherry Key FMW:[id number] DOB: 1986/01/10 DOA: 05/12/2024 DOS: the patient was seen and examined on 05/12/2024 PCP: Knute Thersia Bitters, FNP  Patient coming from: Home  Chief Complaint:  Chief Complaint  Patient presents with   Abdominal Pain   HPI: Sherry Key is a 38 y.o. female with medical history significant of cannabinoid hyperemesis syndrome, cyclical vomiting, Crohn's disease, drug-seeking behavior, chronic pain syndrome, endometriosis, fibroids, partial hysterectomy, PCOS, hypertension, idiopathic intracranial hypertension who presented to the emergency department complaints of abdominal pain, diarrhea, nausea and vomiting for the last week after she got exposed to her supervisor at work who had similar symptoms.  However, she also stated that she had some cannabis edibles.  She has been vomiting more than 10 times a day.  No constipation, melena or hematochezia.  No flank pain, dysuria, frequency or hematuria. She denied fever, chills, rhinorrhea, sore throat, wheezing or hemoptysis.  No chest pain, palpitations, diaphoresis, PND, orthopnea or pitting edema of the lower extremities.   No polyuria, polydipsia, polyphagia or blurred vision.   ED course: Initial vital signs were temperature 98.3 F, pulse 7013, respiration 19, BP 140/118 mmHg and O2 sat 95% on room air.  The patient received dicyclomine  20 mg IM, Normal Saline 1000 mm bolus, normal saline plus KCl 21,000 mm bolus and KCl 10 mEq IVPB x 4.  Lab work: UDS was positive for benzodiazepines and THC.  Urinalysis had small hemoglobin and ketones of 20 mg/dL.  CBC showed a white count of 13.3, hemoglobin 14.2 g/dL and platelets 682.  Normal lipase and magnesium.  CMP showed a sodium 137, potassium 2.5, chloride 95 and CO2 23 mmol/L with an anion gap of 20.  Glucose 147, BUN 28, creatinine 1.36 and calcium 10.3 mg/dL.  LFTs were normal with the exception of a total protein of 8.9  g/dL.    Review of Systems: As mentioned in the history of present illness. All other systems reviewed and are negative. Past Medical History:  Diagnosis Date   Cannabinoid hyperemesis syndrome    Crohn's disease (HCC)    Cyclical vomiting    Drug-seeking behavior - pt seeking opiates specifically dilaudid  and IV phenergan . 09/24/2023   Endometriosis    Fibroids    History of partial hysterectomy 10/24/2019   Hypertension    PCOS (polycystic ovarian syndrome)    Past Surgical History:  Procedure Laterality Date   ABDOMINAL HYSTERECTOMY     CESAREAN SECTION     CESAREAN SECTION     CHOLECYSTECTOMY     HERNIA REPAIR     TONSILLECTOMY     TUBAL LIGATION     TUBAL LIGATION     Social History:  reports that she has been smoking cigarettes. She has never used smokeless tobacco. She reports current drug use. Drug: Marijuana. She reports that she does not drink alcohol.  Allergies  Allergen Reactions   Zofran  [Ondansetron  Hcl] Hives and Shortness Of Breath   Droperidol  Other (See Comments)    Tongue spasms, drooling, dystonia  Tongue spasm starts to stick out her tongue and wave it side-to-side.  Maintains airway, oxygenation okay, no respiratory distress no other symptoms.  Consider dystonic reaction vs volitional reaction.   Ketorolac  Other (See Comments)    Tongue spasms, drooling, dystonia   Peanut-Containing Drug Products Hives and Itching   Tomato Hives and Itching   Prochlorperazine  Other (See Comments)    Tongue spasms   Reglan  [Metoclopramide ] Other (See Comments)  Tongue spasms   Zofran  Hives and Itching   Haloperidol  Rash   Other Itching    Fuzzy fruit    No family history on file.  Prior to Admission medications   Medication Sig Start Date End Date Taking? Authorizing Provider  acetaminophen  (TYLENOL ) 500 MG tablet Take 500 mg by mouth every 6 (six) hours as needed for moderate pain or mild pain.   Yes [provider]  chlorthalidone   (THALITONE ) 15 MG tablet Take 1 tablet (15 mg total) by mouth daily. 03/05/24  Yes Caudle, Thersia Bitters, FNP  clotrimazole -betamethasone  (LOTRISONE ) cream Apply 1 Application topically daily. 01/02/24  Yes Janit Thresa HERO, DPM  dicyclomine  (BENTYL ) 20 MG tablet Take 1 tablet (20 mg total) by mouth 2 (two) times daily. 03/05/24  Yes Caudle, Thersia Bitters, FNP  diphenhydrAMINE  (BENADRYL ) 25 MG tablet Take 25 mg by mouth every 6 (six) hours as needed for allergies.   Yes [provider]  Multiple Vitamins-Minerals (CERTAVITE/ANTIOXIDANTS) TABS Take 1 tablet by mouth daily. 01/20/23  Yes Regalado, Belkys A, MD  promethazine  (PHENERGAN ) 25 MG suppository Place 1 suppository (25 mg total) rectally every 6 (six) hours as needed for nausea or vomiting. 02/01/24  Yes Neysa Caron PARAS, DO  promethazine  (PHENERGAN ) 25 MG tablet Take 1 tablet (25 mg total) by mouth every 6 (six) hours as needed for nausea or vomiting. 03/05/24  Yes Caudle, Thersia Bitters, FNP  topiramate  (TOPAMAX ) 25 MG tablet Take 1 tablet (25 mg total) by mouth at bedtime for 7 days, THEN 2 tablets (50 mg total) daily. 03/05/24 05/12/24 Yes Caudle, Thersia Bitters, FNP  fluconazole  (DIFLUCAN ) 150 MG tablet Take 1 tablet (150 mg total) by mouth every other day as needed. Patient not taking: Reported on 05/12/2024 03/24/24   Lo, Arland POUR, CNM  metroNIDAZOLE  (FLAGYL ) 500 MG tablet Take 1 tablet (500 mg total) by mouth 2 (two) times daily. Patient not taking: Reported on 05/12/2024 03/27/24   Tad Arland POUR, CNM    Physical Exam: Vitals:   05/12/24 1115 05/12/24 1130 05/12/24 1300 05/12/24 1403  BP: (!) 147/102 (!) 134/94 (!) 137/102 (!) 157/114  Pulse:    (!) 105  Resp: (!) 22 17 (!) 30 20  Temp:   98.3 F (36.8 C) 98.4 F (36.9 C)  TempSrc:   Oral   SpO2:    93%   Physical Exam Vitals and nursing note reviewed.  Constitutional:      General: She is awake. She is not in acute distress.    Appearance: She is well-developed. She is  ill-appearing.  HENT:     Head: Normocephalic.     Nose: No rhinorrhea.     Mouth/Throat:     Mouth: Mucous membranes are dry.  Eyes:     General: No scleral icterus.    Pupils: Pupils are equal, round, and reactive to light.  Neck:     Vascular: No JVD.  Cardiovascular:     Rate and Rhythm: Normal rate and regular rhythm.     Heart sounds: S1 normal and S2 normal.  Pulmonary:     Effort: Pulmonary effort is normal.     Breath sounds: Normal breath sounds. No wheezing, rhonchi or rales.  Abdominal:     General: Bowel sounds are normal. There is no distension.     Palpations: Abdomen is soft.     Tenderness: There is abdominal tenderness in the epigastric area. There is no right CVA tenderness, left CVA tenderness, guarding or rebound.  Musculoskeletal:  Cervical back: Neck supple.     Right lower leg: No edema.     Left lower leg: No edema.  Skin:    General: Skin is warm and dry.  Neurological:     General: No focal deficit present.     Mental Status: She is alert and oriented to person, place, and time.  Psychiatric:        Mood and Affect: Mood normal.        Behavior: Behavior normal. Behavior is cooperative.     Data Reviewed:  Results are pending, will review when available. EKG: Vent. rate 111 BPM PR interval 148 ms QRS duration 88 ms QT/QTcB 387/526 ms P-R-T axes 91 80 -57 Sinus tachycardia Probable left atrial enlargement Probable left ventricular hypertrophy Nonspecific T abnormalities, inferior leads Prolonged QT interval  Assessment and Plan: Principal Problem:   Intractable nausea and vomiting Leading to:   Dehydration And:   AKI (acute kidney injury) Observation/telemetry. Continue IV fluids. Hold chlorthalidone . - Would discontinue diuretic. - Would avoid an ACE/ARB as well. Avoid hypotension. Avoid nephrotoxins. Monitor intake and output. Monitor renal function electrolytes. Advance diet to clear liquid as tolerated.  Active  Problems:   Cannabis hyperemesis syndrome  concurrent with and due to cannabis dependence (HCC) Cannabis cessation advised.    Hypokalemia Replacing. Magnesium was supplemented. Follow-up potassium level in AM.    Hyperglycemia Check fasting glucose in the morning. Further workup depending on results.    Hyperproteinemia Secondary to hemoconcentration. Continue IV fluids.   Advance Care Planning:   Code Status: Full Code   Consults:   Family Communication:   Severity of Illness: The appropriate patient status for this patient is OBSERVATION. Observation status is judged to be reasonable and necessary in order to provide the required intensity of service to ensure the patient's safety. The patient's presenting symptoms, physical exam findings, and initial radiographic and laboratory data in the context of their medical condition is felt to place them at decreased risk for further clinical deterioration. Furthermore, it is anticipated that the patient will be medically stable for discharge from the hospital within 2 midnights of admission.   Author: Alm Dorn Castor, MD 05/12/2024 2:16 PM  For on call review [email or web address].   This document was prepared using Dragon voice recognition software and may contain some unintended transcription errors.

## 2024-05-12 NOTE — ED Notes (Signed)
 Pt stated she couldn't give a urine sample at this time.

## 2024-05-12 NOTE — Progress Notes (Signed)
 Plan of Care Note for accepted transfer   Patient: Sherry Key MRN: [id number]   DOA: 05/12/2024  Facility requesting transfer: [facility].  Requesting Provider: Sidra Law, MD Reason for transfer: AKI, hypokalemia due to intractable nausea and vomiting. Facility course:  38 year old female with past medical history of Crohn's disease, tobacco abuse, cannabis hyperemesis syndrome, drug-seeking behavior who presented to emergency department with a recurrence of nausea and vomiting.  Workup showing AKI and severe hypokalemia.  She has been given IV fluids and potassium replacement.  Plan of care: The patient is accepted for admission to Telemetry unit, at [facility].  Author: Alm Dorn Castor, MD 05/12/2024  Check [email or web address] for on-call coverage.  Nursing staff, Please call [facility] Admits & Consults System-Wide number on Amion as soon as patient's arrival, so appropriate admitting provider can evaluate the pt.

## 2024-05-12 NOTE — ED Notes (Signed)
 ED Provider at bedside.

## 2024-05-12 NOTE — ED Triage Notes (Addendum)
 Pt presents with generalized abdominal pain & multiple episodes of emesis. Reports has been intermittent X 1 week. History of crohn's  Actively vomiting in triage

## 2024-05-12 NOTE — ED Provider Notes (Signed)
 [facility] Provider Note   CSN: [id number] Arrival date & time: 05/12/24  0701   Patient presents with: Abdominal Pain   Sherry Key is a 38 y.o. female. The patient presents via EMS today with complaints of abdominal pain, nausea, vomiting.  She does have a well-known history of cyclic vomiting syndrome and states that she feels the same as she does when she has flareups.  She has been unable to hold down food since last Thursday including her home medications and sips of water.  She is asking for antiemetics and pain medicine upon her conversation.  She is also reporting minimal diarrhea however no melena or hematochezia.  She she is stating that she is having lower abdominal pain that is making it difficult for her to function.  She last smoked marijuana a week and 1/2 to 2 weeks ago of note, she does have a documented history of Crohn's, however the patient reports that this is inaccurate.  She denies chest pain, shortness of breath, and hematemesis.    Abdominal Pain Associated symptoms: diarrhea, nausea and vomiting   Associated symptoms: no chest pain and no shortness of breath        Prior to Admission medications   Medication Sig Start Date End Date Taking? Authorizing Provider  acetaminophen  (TYLENOL ) 500 MG tablet Take 500 mg by mouth every 6 (six) hours as needed for moderate pain or mild pain.    [provider]  chlorthalidone  (THALITONE ) 15 MG tablet Take 1 tablet (15 mg total) by mouth daily. 03/05/24   Caudle, Thersia Bitters, FNP  clotrimazole -betamethasone  (LOTRISONE ) cream Apply 1 Application topically daily. 01/02/24   Janit Thresa HERO, DPM  dicyclomine  (BENTYL ) 20 MG tablet Take 1 tablet (20 mg total) by mouth 2 (two) times daily. 03/05/24   Caudle, Thersia Bitters, FNP  diphenhydrAMINE  (BENADRYL ) 25 MG tablet Take 25 mg by mouth every 6 (six) hours as needed for allergies.    [provider]  fluconazole   (DIFLUCAN ) 150 MG tablet Take 1 tablet (150 mg total) by mouth every other day as needed. Patient not taking: Reported on 05/12/2024 03/24/24   Lo, Arland POUR, CNM  metroNIDAZOLE  (FLAGYL ) 500 MG tablet Take 1 tablet (500 mg total) by mouth 2 (two) times daily. Patient not taking: Reported on 05/12/2024 03/27/24   Lo, Arland POUR, CNM  Multiple Vitamins-Minerals (CERTAVITE/ANTIOXIDANTS) TABS Take 1 tablet by mouth daily. 01/20/23   Regalado, Belkys A, MD  promethazine  (PHENERGAN ) 25 MG suppository Place 1 suppository (25 mg total) rectally every 6 (six) hours as needed for nausea or vomiting. 02/01/24   Neysa Caron PARAS, DO  promethazine  (PHENERGAN ) 25 MG tablet Take 1 tablet (25 mg total) by mouth every 6 (six) hours as needed for nausea or vomiting. 03/05/24   Caudle, Thersia Bitters, FNP  topiramate  (TOPAMAX ) 25 MG tablet Take 1 tablet (25 mg total) by mouth at bedtime for 7 days, THEN 2 tablets (50 mg total) daily. 03/05/24 04/22/24  CaudleThersia Bitters, FNP    Allergies: Zofran  [ondansetron  hcl], Droperidol , Ketorolac , Peanut-containing drug products, Tomato, Prochlorperazine , Reglan  [metoclopramide ], Zofran , Haloperidol , and Other    Review of Systems  Constitutional:  Positive for appetite change.  Respiratory:  Negative for chest tightness and shortness of breath.   Cardiovascular:  Negative for chest pain.  Gastrointestinal:  Positive for abdominal pain, diarrhea, nausea and vomiting. Negative for anal bleeding and blood in stool.    Updated Vital Signs BP (!) 167/113  Pulse (!) 109   Temp 98.3 F (36.8 C)   Resp (!) 24   LMP 10/19/2019   SpO2 100%   Physical Exam Constitutional:      General: She is not in acute distress. HENT:     Head: Normocephalic and atraumatic.     Mouth/Throat:     Mouth: Mucous membranes are moist.  Cardiovascular:     Rate and Rhythm: Regular rhythm. Tachycardia present.     Heart sounds: Normal heart sounds.  Pulmonary:     Effort: Pulmonary effort is  normal. No respiratory distress.     Breath sounds: Normal breath sounds.  Abdominal:     General: Abdomen is flat. Bowel sounds are normal. There is no distension.     Palpations: Abdomen is soft.     Tenderness: There is no abdominal tenderness. There is no guarding or rebound.  Neurological:     Mental Status: She is alert.      (all labs ordered are listed, but only abnormal results are displayed) Labs Reviewed  COMPREHENSIVE METABOLIC PANEL WITH GFR - Abnormal; Notable for the following components:      Result Value   Potassium 2.5 (*)    Chloride 95 (*)    Glucose, Bld 147 (*)    BUN 28 (*)    Creatinine, Ser 1.36 (*)    Total Protein 8.9 (*)    GFR, Estimated 51 (*)    Anion gap 20 (*)    All other components within normal limits  CBC - Abnormal; Notable for the following components:   WBC 13.3 (*)    All other components within normal limits  LIPASE, BLOOD  MAGNESIUM  URINALYSIS, ROUTINE W REFLEX MICROSCOPIC  URINE DRUG SCREEN    EKG: EKG Interpretation Date/Time:  Monday May 12 2024 10:02:39 EST Ventricular Rate:  111 PR Interval:  148 QRS Duration:  88 QT Interval:  387 QTC Calculation: 526 R Axis:   80  Text Interpretation: Sinus tachycardia Probable left atrial enlargement Probable left ventricular hypertrophy Nonspecific T abnormalities, inferior leads Prolonged QT interval Baseline wander in lead(s) V1 Confirmed by Darra Chew [phone]) on 05/12/2024 10:13:52 AM  Radiology: No results found.  Procedures   Medications Ordered in the ED  promethazine  (PHENERGAN ) 12.5 mg in sodium chloride  0.9 % 50 mL IVPB (0 mg Intravenous Stopped 05/12/24 0952)  promethazine  (PHENERGAN ) 25 MG/ML injection (  Not Given 05/12/24 0758)  potassium chloride  10 mEq in 100 mL IVPB (10 mEq Intravenous New Bag/Given 05/12/24 0951)  0.9 % NaCl with KCl 20 mEq/ L  infusion (has no administration in time range)  sodium chloride  0.9 % bolus 1,000 mL (0 mLs Intravenous Stopping  previously hung infusion 05/12/24 0935)  dicyclomine  (BENTYL ) injection 20 mg (20 mg Intramuscular Given 05/12/24 0737)  LORazepam  (ATIVAN ) injection 2 mg (2 mg Intravenous Given 05/12/24 9177)                                    Medical Decision Making Amount and/or Complexity of Data Reviewed Labs: ordered.  Risk Prescription drug management. Decision regarding hospitalization.  #Cyclic Vomiting Syndrome #Cannabinoid hyperemesis The patient presents today with complaints of cyclic vomiting and the inability to keep food down since last Thursday (4 days.) She does have a history of cannabinoid hyperemesis and is well known to the ER. Today upon presentation, she is asking for phenergan  and dilaudid  for nausea  and pain control. Upon examination, no abdominal tenderness noted to superficial or deep palpation. No guarding or rebound tenderness. She is moving around frequently in bed and having trouble staying still. On exam, no tenderness to palpation of the abdomen. At this time, low suspicion for acute abdomen or intraabdominal process. Suspect this is related to cyclic vomiting syndrome and will treat with phenergan , bentyl , and IV fluid Bolus.  The patient continues to experience intractable n/v without relief from phenergan . Attempted droperidol , however pt refused to due allergy of tongue spasms, which is noted in her chart to be a reaction from many commonly used medications.  At this time, due to intractable n/v, hypoklemia, and AGMA, this patient would benefit from admission to the hospital. Discussed this with the patient who was agreeable. Pt discussed with Dr. Celinda who accepted admission  #Electrolyte abnormalities #AKI #AGMA Pt's CMP resulted with K 2.5. Mag 2.2. Repleted K+ with 60mEq in the ED. Suspect this is secondary to poor oral intake, diarrhea, and home chlorthalidone  dosing. AKI also present with co-existent AGMA. Likely pre-renal, secondary to poor oral intake with  potential starvation ketosis. UA pending. Will order second IV fluid Bolus and admit to hospitalist service.   Final diagnoses:  Intractable nausea and vomiting  Hypokalemia  Cannabinoid hyperemesis syndrome    ED Discharge Orders     None          Myrna Bitters, DO 05/12/24 1106    Long, Joshua G, MD 05/13/24 [phone]

## 2024-05-12 NOTE — Plan of Care (Signed)
  Problem: Education: Goal: Knowledge of General Education information will improve Description: Including pain rating scale, medication(s)/side effects and non-pharmacologic comfort measures Outcome: Progressing   Problem: Health Behavior/Discharge Planning: Goal: Ability to manage health-related needs will improve Outcome: Progressing   Problem: Clinical Measurements: Goal: Ability to maintain clinical measurements within normal limits will improve Outcome: Progressing Goal: Diagnostic test results will improve Outcome: Progressing   Problem: Nutrition: Goal: Adequate nutrition will be maintained Outcome: Progressing   Problem: Coping: Goal: Level of anxiety will decrease Outcome: Progressing   Problem: Elimination: Goal: Will not experience complications related to bowel motility Outcome: Progressing Goal: Will not experience complications related to urinary retention Outcome: Progressing   Problem: Pain Managment: Goal: General experience of comfort will improve and/or be controlled Outcome: Progressing   Problem: Safety: Goal: Ability to remain free from injury will improve Outcome: Progressing

## 2024-05-13 ENCOUNTER — Inpatient Hospital Stay (HOSPITAL_COMMUNITY): Payer: MEDICAID

## 2024-05-13 DIAGNOSIS — E8809 Other disorders of plasma-protein metabolism, not elsewhere classified: Secondary | ICD-10-CM | POA: Diagnosis present

## 2024-05-13 DIAGNOSIS — K5090 Crohn's disease, unspecified, without complications: Secondary | ICD-10-CM | POA: Diagnosis present

## 2024-05-13 DIAGNOSIS — F17210 Nicotine dependence, cigarettes, uncomplicated: Secondary | ICD-10-CM | POA: Diagnosis present

## 2024-05-13 DIAGNOSIS — I10 Essential (primary) hypertension: Secondary | ICD-10-CM | POA: Diagnosis present

## 2024-05-13 DIAGNOSIS — Z79899 Other long term (current) drug therapy: Secondary | ICD-10-CM | POA: Diagnosis not present

## 2024-05-13 DIAGNOSIS — E860 Dehydration: Secondary | ICD-10-CM | POA: Diagnosis present

## 2024-05-13 DIAGNOSIS — Z889 Allergy status to unspecified drugs, medicaments and biological substances status: Secondary | ICD-10-CM | POA: Diagnosis not present

## 2024-05-13 DIAGNOSIS — Z91018 Allergy to other foods: Secondary | ICD-10-CM | POA: Diagnosis not present

## 2024-05-13 DIAGNOSIS — Z765 Malingerer [conscious simulation]: Secondary | ICD-10-CM | POA: Diagnosis not present

## 2024-05-13 DIAGNOSIS — E876 Hypokalemia: Secondary | ICD-10-CM | POA: Diagnosis not present

## 2024-05-13 DIAGNOSIS — F1220 Cannabis dependence, uncomplicated: Secondary | ICD-10-CM | POA: Diagnosis present

## 2024-05-13 DIAGNOSIS — Z90711 Acquired absence of uterus with remaining cervical stump: Secondary | ICD-10-CM | POA: Diagnosis not present

## 2024-05-13 DIAGNOSIS — G894 Chronic pain syndrome: Secondary | ICD-10-CM | POA: Diagnosis present

## 2024-05-13 DIAGNOSIS — R739 Hyperglycemia, unspecified: Secondary | ICD-10-CM | POA: Diagnosis present

## 2024-05-13 DIAGNOSIS — Z888 Allergy status to other drugs, medicaments and biological substances status: Secondary | ICD-10-CM | POA: Diagnosis not present

## 2024-05-13 DIAGNOSIS — R1116 Cannabis hyperemesis syndrome: Secondary | ICD-10-CM | POA: Diagnosis present

## 2024-05-13 DIAGNOSIS — N179 Acute kidney failure, unspecified: Secondary | ICD-10-CM | POA: Diagnosis present

## 2024-05-13 DIAGNOSIS — Z886 Allergy status to analgesic agent status: Secondary | ICD-10-CM | POA: Diagnosis not present

## 2024-05-13 DIAGNOSIS — Z9049 Acquired absence of other specified parts of digestive tract: Secondary | ICD-10-CM | POA: Diagnosis not present

## 2024-05-13 LAB — URINALYSIS, ROUTINE W REFLEX MICROSCOPIC
Bacteria, UA: NONE SEEN
Bilirubin Urine: NEGATIVE
Glucose, UA: NEGATIVE mg/dL
Ketones, ur: NEGATIVE mg/dL
Leukocytes,Ua: NEGATIVE
Nitrite: NEGATIVE
Protein, ur: NEGATIVE mg/dL
Specific Gravity, Urine: 1.003 — ABNORMAL LOW (ref 1.005–1.030)
pH: 7 (ref 5.0–8.0)

## 2024-05-13 LAB — URINE DRUG SCREEN
Amphetamines: NEGATIVE
Barbiturates: NEGATIVE
Benzodiazepines: NEGATIVE
Cocaine: NEGATIVE
Fentanyl: NEGATIVE
Methadone Scn, Ur: NEGATIVE
Opiates: NEGATIVE
Tetrahydrocannabinol: POSITIVE — AB

## 2024-05-13 LAB — COMPREHENSIVE METABOLIC PANEL WITH GFR
ALT: 8 U/L (ref 0–44)
AST: 16 U/L (ref 15–41)
Albumin: 3.5 g/dL (ref 3.5–5.0)
Alkaline Phosphatase: 54 U/L (ref 38–126)
Anion gap: 9 (ref 5–15)
BUN: 8 mg/dL (ref 6–20)
CO2: 21 mmol/L — ABNORMAL LOW (ref 22–32)
Calcium: 8.1 mg/dL — ABNORMAL LOW (ref 8.9–10.3)
Chloride: 107 mmol/L (ref 98–111)
Creatinine, Ser: 0.74 mg/dL (ref 0.44–1.00)
GFR, Estimated: >60 mL/min (ref >60)
Glucose, Bld: 95 mg/dL (ref 70–99)
Potassium: 3.5 mmol/L (ref 3.5–5.1)
Sodium: 137 mmol/L (ref 135–145)
Total Bilirubin: 0.8 mg/dL (ref 0.0–1.2)
Total Protein: 5.8 g/dL — ABNORMAL LOW (ref 6.5–8.1)

## 2024-05-13 LAB — CBC
HCT: 30.7 % — ABNORMAL LOW (ref 36.0–46.0)
Hemoglobin: 10.4 g/dL — ABNORMAL LOW (ref 12.0–15.0)
MCH: 31.4 pg (ref 26.0–34.0)
MCHC: 33.9 g/dL (ref 30.0–36.0)
MCV: 92.7 fL (ref 80.0–100.0)
Platelets: 223 K/uL (ref 150–400)
RBC: 3.31 MIL/uL — ABNORMAL LOW (ref 3.87–5.11)
RDW: 13.2 % (ref 11.5–15.5)
WBC: 8 K/uL (ref 4.0–10.5)
nRBC: 0 % (ref 0.0–0.2)

## 2024-05-13 MED ORDER — IOHEXOL 300 MG/ML  SOLN
100.0000 mL | Freq: Once | INTRAMUSCULAR | Status: AC | PRN
  Administered 2024-05-13: 100 mL via INTRAVENOUS

## 2024-05-13 MED ORDER — SODIUM CHLORIDE (PF) 0.9 % IJ SOLN
INTRAMUSCULAR | Status: AC
  Filled 2024-05-13: qty 50

## 2024-05-13 MED ORDER — SODIUM CHLORIDE 0.9 % IV SOLN
INTRAVENOUS | Status: DC
Start: 2024-05-13 — End: 2024-05-14

## 2024-05-13 MED ORDER — IOHEXOL 9 MG/ML PO SOLN
500.0000 mL | ORAL | Status: AC
  Administered 2024-05-13 (×2): 500 mL via ORAL

## 2024-05-13 MED ORDER — HYDROMORPHONE HCL 1 MG/ML IJ SOLN
0.5000 mg | INTRAMUSCULAR | Status: DC | PRN
  Administered 2024-05-13 – 2024-05-14 (×6): 0.5 mg via INTRAVENOUS
  Filled 2024-05-13 (×6): qty 0.5

## 2024-05-13 MED ORDER — IOHEXOL 9 MG/ML PO SOLN
ORAL | Status: AC
  Filled 2024-05-13: qty 1000

## 2024-05-13 NOTE — Progress Notes (Signed)
 PROGRESS NOTE  Sherry Key  FMW:[id number] DOB: [birth year] DOA: 05/12/2024 PCP: Knute Thersia Bitters, FNP   Brief Narrative: Patient is a 38 year old female with history of cannabinoid hyperemesis syndrome, cyclical vomiting, Crohn's disease, drug-seeking behavior, chronic pain syndrome endometriosis status post partial hysterectomy, chronic headache who presented with abdominal pain, diarrhea, nausea, vomiting.  Diarrhea started about a week ago.  Patient says she uses cannabis to alleviate her chronic abdominal pain.  On presentation, she was hemodynamically stable.  UDS positive for benzo, THC.  Normal lipase level.  Lab work showed hypokalemia with potassium of 2.5, creatinine of 1.3.  Patient admitted for the management of cannabinoid hyperemesis syndrome and diarrhea.  GI pathogen panel pending.  Assessment & Plan:  Principal Problem:   AKI (acute kidney injury) Active Problems:   Cannabis hyperemesis syndrome concurrent with and due to cannabis dependence (HCC)   Hypokalemia   Dehydration   Intractable nausea and vomiting   Hyperglycemia   Hyperproteinemia   Intractable nausea/vomiting:Likely secondary to  cannabinoid hyperemesis syndrome.  Has been in the emergency department numerous times for this.  Has chronic abdominal pain.  She says she uses cannabis for her abdominal pain.  Abdomen imaging not pursued on admission. Will order CT abdomen/pelvis with contrast. Abdomen is mostly benign on examination but she complains of intractable nausea and abdominal discomfort.  Last abdominal CT on 7/25 did not show any acute findings. Continue gentle IV fluids, antiemetics Counseled to stop cannabis. She was to remain on soft diet today.  Diarrhea: Unclear etiology.  She says it started 7 days ago.  Will check GI pathogen panel.  Low suspicion for C. difficile  Hypokalemia: Supplemented and corrected  AKI: Resolved  Hypertension: Takes chlorthalidone  at home.  Hypertensive on  presentation.  Chlorthalidone  substituted with amlodipine  on admission         DVT prophylaxis:enoxaparin  (LOVENOX ) injection 40 mg Start: 05/12/24 2200     Code Status: Full Code  Family Communication: None at bedside  Patient status: Observation  Patient is from : Home  Anticipated discharge to: Home  Estimated DC date: Tomorrow   Consultants: None  Procedures: None  Antimicrobials:  Anti-infectives (From admission, onward)    None       Subjective: Patient seen and examined at bedside today.  Doing myl evaluation, she was trying to eat soft diet.  She complains of vague abdominal discomfort but abdomen is mostly benign on examination.  She reported numerous loose bowel movements.  Complains of nausea  Objective: Vitals:   05/12/24 2059 05/13/24 0119 05/13/24 0534 05/13/24 1011  BP: 106/75 107/77 100/65 113/77  Pulse: 89 87 81   Resp: 18 16 17    Temp: 98.2 F (36.8 C) (!) 97.5 F (36.4 C) 98.3 F (36.8 C) 98.4 F (36.9 C)  TempSrc:  Oral Oral Oral  SpO2: 100% 100% 100% 100%    Intake/Output Summary (Last 24 hours) at 05/13/2024 1120 Last data filed at 05/13/2024 0700 Gross per 24 hour  Intake 2606.89 ml  Output 500 ml  Net 2106.89 ml   There were no vitals filed for this visit.  Examination:  General exam: Overall comfortable, not in distress HEENT: PERRL Respiratory system:  no wheezes or crackles  Cardiovascular system: S1 & S2 heard, RRR.  Gastrointestinal system: Abdomen is nondistended, soft .  Generalized tenderness on deep palpation Central nervous system: Alert and oriented Extremities: No edema, no clubbing ,no cyanosis Skin: No rashes, no ulcers,no icterus     Data Reviewed:  I have personally reviewed following labs and imaging studies  CBC: Recent Labs  Lab 05/12/24 0722 05/13/24 0556  WBC 13.3* 8.0  HGB 14.2 10.4*  HCT 40.9 30.7*  MCV 89.5 92.7  PLT 317 223   Basic Metabolic Panel: Recent Labs  Lab 05/12/24 0722  05/12/24 1655 05/13/24 0556  NA 137 139 137  K 2.5* 3.5 3.5  CL 95* 105 107  CO2 23 20* 21*  GLUCOSE 147* 95 95  BUN 28* 17 8  CREATININE 1.36* 0.88 0.74  CALCIUM 10.3 8.3* 8.1*  MG 2.2  --   --      No results found for this or any previous visit (from the past 240 hours).   Radiology Studies: No results found.  Scheduled Meds:  amLODipine   5 mg Oral Daily   enoxaparin  (LOVENOX ) injection  40 mg Subcutaneous Q24H   Continuous Infusions:  sodium chloride  100 mL/hr at 05/13/24 1001   promethazine  (PHENERGAN ) injection (IM or IVPB) 12.5 mg (05/13/24 0703)     LOS: 0 days   Ivonne Mustache, MD [facility] P11/10/2023, 11:20 AM

## 2024-05-13 NOTE — Plan of Care (Signed)
   Problem: Activity: Goal: Risk for activity intolerance will decrease Outcome: Progressing   Problem: Coping: Goal: Level of anxiety will decrease Outcome: Progressing   Problem: Safety: Goal: Ability to remain free from injury will improve Outcome: Progressing   Problem: Skin Integrity: Goal: Risk for impaired skin integrity will decrease Outcome: Progressing

## 2024-05-13 NOTE — Plan of Care (Signed)
  Problem: Education: Goal: Knowledge of General Education information will improve Description: Including pain rating scale, medication(s)/side effects and non-pharmacologic comfort measures Outcome: Progressing   Problem: Activity: Goal: Risk for activity intolerance will decrease Outcome: Progressing   Problem: Coping: Goal: Level of anxiety will decrease Outcome: Progressing   Problem: Elimination: Goal: Will not experience complications related to bowel motility Outcome: Progressing Goal: Will not experience complications related to urinary retention Outcome: Progressing   Problem: Pain Managment: Goal: General experience of comfort will improve and/or be controlled Outcome: Progressing   Problem: Safety: Goal: Ability to remain free from injury will improve Outcome: Progressing   Problem: Skin Integrity: Goal: Risk for impaired skin integrity will decrease Outcome: Progressing   Problem: Fluid Volume: Goal: Maintenance of adequate hydration will improve Outcome: Progressing   Problem: Nutrition: Goal: Adequate nutrition will be maintained Outcome: Not Progressing   Problem: Education: Goal: Knowledge of disease or condition will improve Outcome: Not Progressing Goal: Knowledge of the prescribed therapeutic regimen will improve Outcome: Not Progressing   Problem: Bowel/Gastric: Goal: Occurences of nausea and/or vomiting will decrease Outcome: Not Progressing   Problem: Nutritional: Goal: Achievement of adequate weight for body size and type will improve Outcome: Not Progressing

## 2024-05-13 NOTE — Progress Notes (Signed)
   05/13/24 0849  TOC Brief Assessment  Insurance and Status Reviewed  Patient has primary care physician Yes  Home environment has been reviewed single family home  Prior level of function: independent  Prior/Current Home Services No current home services  Social Drivers of Health Review SDOH reviewed no interventions necessary  Readmission risk has been reviewed Yes  Transition of care needs transition of care needs identified, TOC will continue to follow    Signed: Heather Saltness, MSW, LCSW Clinical Social Worker Inpatient Care Management 05/13/2024 8:49 AM

## 2024-05-13 NOTE — Plan of Care (Signed)
  Problem: Education: Goal: Knowledge of General Education information will improve Description: Including pain rating scale, medication(s)/side effects and non-pharmacologic comfort measures Outcome: Progressing   Problem: Clinical Measurements: Goal: Ability to maintain clinical measurements within normal limits will improve Outcome: Progressing   Problem: Coping: Goal: Level of anxiety will decrease Outcome: Progressing   Problem: Bowel/Gastric: Goal: Occurences of nausea and/or vomiting will decrease Outcome: Progressing

## 2024-05-13 NOTE — Progress Notes (Signed)
 Mobility Specialist Progress Note:   05/13/24 1447  Mobility  Activity Ambulated with assistance  Level of Assistance Contact guard assist, steadying assist  Assistive Device Front wheel walker  Distance Ambulated (ft) 220 ft  Activity Response Tolerated well  Mobility Referral Yes  Mobility visit 1 Mobility  Mobility Specialist Start Time (ACUTE ONLY) 1407  Mobility Specialist Stop Time (ACUTE ONLY) 1419  Mobility Specialist Time Calculation (min) (ACUTE ONLY) 12 min   Pt was received in bed and agreed to mobility. No complaints during session. Returned to bed with all needs met. Call bell in reach and bed alarm on.   [employer] - Mobility Specialist

## 2024-05-14 DIAGNOSIS — F1220 Cannabis dependence, uncomplicated: Secondary | ICD-10-CM

## 2024-05-14 DIAGNOSIS — R1116 Cannabis hyperemesis syndrome: Secondary | ICD-10-CM

## 2024-05-14 DIAGNOSIS — N179 Acute kidney failure, unspecified: Secondary | ICD-10-CM | POA: Diagnosis not present

## 2024-05-14 LAB — GASTROINTESTINAL PANEL BY PCR, STOOL (REPLACES STOOL CULTURE)

## 2024-05-14 LAB — BASIC METABOLIC PANEL WITH GFR
Anion gap: 8 (ref 5–15)
BUN: 6 mg/dL (ref 6–20)
CO2: 22 mmol/L (ref 22–32)
Calcium: 8.5 mg/dL — ABNORMAL LOW (ref 8.9–10.3)
Chloride: 108 mmol/L (ref 98–111)
Creatinine, Ser: 0.79 mg/dL (ref 0.44–1.00)
GFR, Estimated: >60 mL/min (ref >60)
Glucose, Bld: 92 mg/dL (ref 70–99)
Potassium: 3.4 mmol/L — ABNORMAL LOW (ref 3.5–5.1)
Sodium: 137 mmol/L (ref 135–145)

## 2024-05-14 MED ORDER — POTASSIUM CHLORIDE 20 MEQ PO PACK
40.0000 meq | PACK | ORAL | Status: DC
  Filled 2024-05-14: qty 2

## 2024-05-14 MED ORDER — POTASSIUM CHLORIDE CRYS ER 20 MEQ PO TBCR
40.0000 meq | EXTENDED_RELEASE_TABLET | Freq: Once | ORAL | Status: AC
  Administered 2024-05-14: 40 meq via ORAL
  Filled 2024-05-14: qty 2

## 2024-05-14 NOTE — Plan of Care (Signed)
  Problem: Education: Goal: Knowledge of General Education information will improve Description: Including pain rating scale, medication(s)/side effects and non-pharmacologic comfort measures Outcome: Adequate for Discharge   Problem: Health Behavior/Discharge Planning: Goal: Ability to manage health-related needs will improve Outcome: Adequate for Discharge   Problem: Clinical Measurements: Goal: Ability to maintain clinical measurements within normal limits will improve Outcome: Adequate for Discharge Goal: Will remain free from infection Outcome: Adequate for Discharge Goal: Diagnostic test results will improve Outcome: Adequate for Discharge Goal: Respiratory complications will improve Outcome: Adequate for Discharge Goal: Cardiovascular complication will be avoided Outcome: Adequate for Discharge   Problem: Activity: Goal: Risk for activity intolerance will decrease Outcome: Adequate for Discharge   Problem: Nutrition: Goal: Adequate nutrition will be maintained Outcome: Adequate for Discharge   Problem: Coping: Goal: Level of anxiety will decrease Outcome: Adequate for Discharge   Problem: Elimination: Goal: Will not experience complications related to bowel motility Outcome: Adequate for Discharge Goal: Will not experience complications related to urinary retention Outcome: Adequate for Discharge   Problem: Pain Managment: Goal: General experience of comfort will improve and/or be controlled Outcome: Adequate for Discharge   Problem: Safety: Goal: Ability to remain free from injury will improve Outcome: Adequate for Discharge   Problem: Skin Integrity: Goal: Risk for impaired skin integrity will decrease Outcome: Adequate for Discharge   Problem: Education: Goal: Knowledge of disease or condition will improve Outcome: Adequate for Discharge Goal: Knowledge of the prescribed therapeutic regimen will improve Outcome: Adequate for Discharge   Problem:  Bowel/Gastric: Goal: Occurences of nausea and/or vomiting will decrease Outcome: Adequate for Discharge   Problem: Fluid Volume: Goal: Maintenance of adequate hydration will improve Outcome: Adequate for Discharge   Problem: Nutritional: Goal: Achievement of adequate weight for body size and type will improve Outcome: Adequate for Discharge

## 2024-05-14 NOTE — Progress Notes (Signed)
 Mobility Specialist Progress Note:   05/14/24 1054  Mobility  Activity Ambulated with assistance  Level of Assistance Contact guard assist, steadying assist  Assistive Device Front wheel walker  Distance Ambulated (ft) 190 ft  Activity Response Tolerated well  Mobility Referral Yes  Mobility visit 1 Mobility  Mobility Specialist Start Time (ACUTE ONLY) 1034  Mobility Specialist Stop Time (ACUTE ONLY) 1049  Mobility Specialist Time Calculation (min) (ACUTE ONLY) 15 min   Pt was received in bed and agreed to mobility. No complaints during ambulation. Returned to bed with all needs met. Call bell in reach.  [employer] - Mobility Specialist

## 2024-05-14 NOTE — Hospital Course (Signed)
 Ms. Townley is a 38 year old female with history of cannabinoid hyperemesis syndrome, cyclical vomiting, Crohn's disease, drug-seeking behavior, chronic pain syndrome, endometriosis status post partial hysterectomy, chronic headache who presented with abdominal pain, diarrhea, nausea, vomiting.  Diarrhea started about a week ago.  Patient says she uses cannabis to alleviate her chronic abdominal pain.   On presentation, she was hemodynamically stable.  UDS positive for benzo, THC.  Normal lipase level.  Lab work showed hypokalemia with potassium of 2.5, creatinine of 1.3.  Patient admitted for the management of cannabinoid hyperemesis syndrome and diarrhea.  GI pathogen panel negative.   Assessment & Plan:   Intractable nausea/vomitin - suspected due to cannabinoid hyperemesis syndrome.  Has been in the emergency department numerous times for this.  Has chronic abdominal pain.  She says she uses cannabis for her abdominal pain.  Abdomen imaging not pursued on admission. - CT A/P performed after admission showing history of cholecystectomy but no acute findings in the abdomen or pelvis especially to explain her pain - UDS positive for THC - Treated with IV fluids and antiemetics, responded well - Diet able to be advanced and tolerated also prior to discharge -Again counseled for THC cessation at discharge   Diarrhea - resolved  - suspect still related to [facility] and diet intake - GI panel negative   Hypokalemia: Supplemented   AKI: Resolved   Hypertension: Takes chlorthalidone  at home

## 2024-05-14 NOTE — Discharge Summary (Signed)
 Physician Discharge Summary   Sherry Key FMW:[id number] DOB: [birth year] DOA: 05/12/2024  PCP: Knute Thersia Bitters, FNP  Admit date: 05/12/2024 Discharge date: 05/14/2024  Admitted From: Home  Disposition:  Home  Discharging physician: Alm Apo, MD Barriers to discharge: none  Discharge Condition: stable CODE STATUS: Full  Diet recommendation:   Hospital Course: Sherry Key is a 38 year old female with history of cannabinoid hyperemesis syndrome, cyclical vomiting, Crohn's disease, drug-seeking behavior, chronic pain syndrome, endometriosis status post partial hysterectomy, chronic headache who presented with abdominal pain, diarrhea, nausea, vomiting.  Diarrhea started about a week ago.  Patient says she uses cannabis to alleviate her chronic abdominal pain.   On presentation, she was hemodynamically stable.  UDS positive for benzo, THC.  Normal lipase level.  Lab work showed hypokalemia with potassium of 2.5, creatinine of 1.3.  Patient admitted for the management of cannabinoid hyperemesis syndrome and diarrhea.  GI pathogen panel negative.   Assessment & Plan:   Intractable nausea/vomitin - suspected due to cannabinoid hyperemesis syndrome.  Has been in the emergency department numerous times for this.  Has chronic abdominal pain.  She says she uses cannabis for her abdominal pain.  Abdomen imaging not pursued on admission. - CT A/P performed after admission showing history of cholecystectomy but no acute findings in the abdomen or pelvis especially to explain her pain - UDS positive for THC - Treated with IV fluids and antiemetics, responded well - Diet able to be advanced and tolerated also prior to discharge -Again counseled for THC cessation at discharge   Diarrhea - resolved  - suspect still related to [facility] and diet intake - GI panel negative   Hypokalemia: Supplemented   AKI: Resolved   Hypertension: Takes chlorthalidone  at home   The patient's acute and  chronic medical conditions were treated accordingly. On day of discharge, patient was felt deemed stable for discharge. Patient/family member advised to call PCP or come back to ER if needed.   Principal Diagnosis: AKI (acute kidney injury)  Discharge Diagnoses: Active Hospital Problems   Diagnosis Date Noted   Cannabis hyperemesis syndrome concurrent with and due to cannabis dependence (HCC) 09/23/2023    Priority: High   Hypokalemia 09/22/2023    Priority: Medium    Hyperproteinemia 05/12/2024    Resolved Hospital Problems   Diagnosis Date Noted Date Resolved   AKI (acute kidney injury) 11/18/2023 05/14/2024   Dehydration 09/22/2023 05/14/2024    Priority: Medium    Intractable nausea and vomiting 05/12/2024 05/14/2024   Hyperglycemia 05/12/2024 05/14/2024     Discharge Instructions     Increase activity slowly   Complete by: As directed       Allergies as of 05/14/2024       Reactions   Zofran  [ondansetron  Hcl] Hives, Shortness Of Breath   Droperidol  Other (See Comments)   Tongue spasms, drooling, dystonia Tongue spasm starts to stick out her tongue and wave it side-to-side.  Maintains airway, oxygenation okay, no respiratory distress no other symptoms.  Consider dystonic reaction vs volitional reaction.   Ketorolac  Other (See Comments)   Tongue spasms, drooling, dystonia   Peanut-containing Drug Products Hives, Itching   Tomato Hives, Itching   Prochlorperazine  Other (See Comments)   Tongue spasms   Reglan  [metoclopramide ] Other (See Comments)   Tongue spasms   Zofran  Hives, Itching   Haloperidol  Rash   Other Itching   Fuzzy fruit        Medication List     STOP taking these medications  fluconazole  150 MG tablet Commonly known as: DIFLUCAN    metroNIDAZOLE  500 MG tablet Commonly known as: FLAGYL        TAKE these medications    acetaminophen  500 MG tablet Commonly known as: TYLENOL  Take 500 mg by mouth every 6 (six) hours as needed for  moderate pain or mild pain.   CertaVite/Antioxidants Tabs Take 1 tablet by mouth daily.   clotrimazole -betamethasone  cream Commonly known as: LOTRISONE  Apply 1 Application topically daily.   dicyclomine  20 MG tablet Commonly known as: BENTYL  Take 1 tablet (20 mg total) by mouth 2 (two) times daily.   diphenhydrAMINE  25 MG tablet Commonly known as: BENADRYL  Take 25 mg by mouth every 6 (six) hours as needed for allergies.   promethazine  25 MG suppository Commonly known as: PHENERGAN  Place 1 suppository (25 mg total) rectally every 6 (six) hours as needed for nausea or vomiting.   promethazine  25 MG tablet Commonly known as: PHENERGAN  Take 1 tablet (25 mg total) by mouth every 6 (six) hours as needed for nausea or vomiting.   Thalitone  15 MG tablet Generic drug: chlorthalidone  Take 1 tablet (15 mg total) by mouth daily.   topiramate  25 MG tablet Commonly known as: Topamax  Take 1 tablet (25 mg total) by mouth at bedtime for 7 days, THEN 2 tablets (50 mg total) daily. Start taking on: March 05, 2024        Allergies  Allergen Reactions   Zofran  [Ondansetron  Hcl] Hives and Shortness Of Breath   Droperidol  Other (See Comments)    Tongue spasms, drooling, dystonia  Tongue spasm starts to stick out her tongue and wave it side-to-side.  Maintains airway, oxygenation okay, no respiratory distress no other symptoms.  Consider dystonic reaction vs volitional reaction.   Ketorolac  Other (See Comments)    Tongue spasms, drooling, dystonia   Peanut-Containing Drug Products Hives and Itching   Tomato Hives and Itching   Prochlorperazine  Other (See Comments)    Tongue spasms   Reglan  [Metoclopramide ] Other (See Comments)    Tongue spasms   Zofran  Hives and Itching   Haloperidol  Rash   Other Itching    Fuzzy fruit   Discharge Exam: BP 104/77 (BP Location: Left Arm)   Pulse 80   Temp 98.6 F (37 C) (Oral)   Resp 17   LMP 10/19/2019   SpO2 100%  Physical  Exam Constitutional:      Appearance: Normal appearance.  HENT:     Head: Normocephalic and atraumatic.     Mouth/Throat:     Mouth: Mucous membranes are moist.  Eyes:     Extraocular Movements: Extraocular movements intact.  Cardiovascular:     Rate and Rhythm: Normal rate and regular rhythm.  Pulmonary:     Effort: Pulmonary effort is normal. No respiratory distress.     Breath sounds: Normal breath sounds. No wheezing.  Abdominal:     General: Bowel sounds are normal. There is no distension.     Palpations: Abdomen is soft.     Tenderness: There is no abdominal tenderness.  Musculoskeletal:        General: Normal range of motion.     Cervical back: Normal range of motion and neck supple.  Skin:    General: Skin is warm and dry.  Neurological:     General: No focal deficit present.     Mental Status: She is alert.  Psychiatric:        Mood and Affect: Mood normal.    The results of significant  diagnostics from this hospitalization (including imaging, microbiology, ancillary and laboratory) are listed below for reference.   Microbiology: Recent Results (from the past 240 hours)  Gastrointestinal Panel by PCR , Stool     Status: None   Collection Time: 05/13/24  9:49 PM   Specimen: Stool  Result Value Ref Range Status   Campylobacter species NOT DETECTED NOT DETECTED Final   Plesimonas shigelloides NOT DETECTED NOT DETECTED Final   Salmonella species NOT DETECTED NOT DETECTED Final   Yersinia enterocolitica NOT DETECTED NOT DETECTED Final   Vibrio species NOT DETECTED NOT DETECTED Final   Vibrio cholerae NOT DETECTED NOT DETECTED Final   Enteroaggregative E coli (EAEC) NOT DETECTED NOT DETECTED Final   Enteropathogenic E coli (EPEC) NOT DETECTED NOT DETECTED Final   Enterotoxigenic E coli (ETEC) NOT DETECTED NOT DETECTED Final   Shiga like toxin producing E coli (STEC) NOT DETECTED NOT DETECTED Final   Shigella/Enteroinvasive E coli (EIEC) NOT DETECTED NOT DETECTED  Final   Cryptosporidium NOT DETECTED NOT DETECTED Final   Cyclospora cayetanensis NOT DETECTED NOT DETECTED Final   Entamoeba histolytica NOT DETECTED NOT DETECTED Final   Giardia lamblia NOT DETECTED NOT DETECTED Final   Adenovirus F40/41 NOT DETECTED NOT DETECTED Final   Astrovirus NOT DETECTED NOT DETECTED Final   Norovirus GI/GII NOT DETECTED NOT DETECTED Final   Rotavirus A NOT DETECTED NOT DETECTED Final   Sapovirus (I, II, IV, and V) NOT DETECTED NOT DETECTED Final    Comment: Performed at [facility], [address] [place], [place] [address]     Labs: BNP (last 3 results) No results for input(s): BNP in the last 8760 hours. Basic Metabolic Panel: Recent Labs  Lab 05/12/24 0722 05/12/24 1655 05/13/24 0556 05/14/24 0627  NA 137 139 137 137  K 2.5* 3.5 3.5 3.4*  CL 95* 105 107 108  CO2 23 20* 21* 22  GLUCOSE 147* 95 95 92  BUN 28* 17 8 6   CREATININE 1.36* 0.88 0.74 0.79  CALCIUM 10.3 8.3* 8.1* 8.5*  MG 2.2  --   --   --    Liver Function Tests: Recent Labs  Lab 05/12/24 0722 05/13/24 0556  AST 21 16  ALT 12 8  ALKPHOS 77 54  BILITOT 0.6 0.8  PROT 8.9* 5.8*  ALBUMIN 5.0 3.5   Recent Labs  Lab 05/12/24 0722  LIPASE 15   No results for input(s): AMMONIA in the last 168 hours. CBC: Recent Labs  Lab 05/12/24 0722 05/13/24 0556  WBC 13.3* 8.0  HGB 14.2 10.4*  HCT 40.9 30.7*  MCV 89.5 92.7  PLT 317 223   Cardiac Enzymes: No results for input(s): CKTOTAL, CKMB, CKMBINDEX, TROPONINI in the last 168 hours. BNP: Invalid input(s): POCBNP CBG: No results for input(s): GLUCAP in the last 168 hours. D-Dimer No results for input(s): DDIMER in the last 72 hours. Hgb A1c No results for input(s): HGBA1C in the last 72 hours. Lipid Profile No results for input(s): CHOL, HDL, LDLCALC, TRIG, CHOLHDL, LDLDIRECT in the last 72 hours. Thyroid  function studies No results for input(s): TSH, T4TOTAL, T3FREE,  THYROIDAB in the last 72 hours.  Invalid input(s): FREET3 Anemia work up No results for input(s): VITAMINB12, FOLATE, FERRITIN, TIBC, IRON, RETICCTPCT in the last 72 hours. Urinalysis    Component Value Date/Time   COLORURINE STRAW (A) 05/13/2024 1453   APPEARANCEUR CLEAR 05/13/2024 1453   LABSPEC 1.003 (L) 05/13/2024 1453   PHURINE 7.0 05/13/2024 1453   GLUCOSEU NEGATIVE 05/13/2024  1453   HGBUR SMALL (A) 05/13/2024 1453   BILIRUBINUR NEGATIVE 05/13/2024 1453   KETONESUR NEGATIVE 05/13/2024 1453   PROTEINUR NEGATIVE 05/13/2024 1453   UROBILINOGEN 0.2 01/18/2015 1324   NITRITE NEGATIVE 05/13/2024 1453   LEUKOCYTESUR NEGATIVE 05/13/2024 1453   Sepsis Labs Recent Labs  Lab 05/12/24 0722 05/13/24 0556  WBC 13.3* 8.0   Microbiology Recent Results (from the past 240 hours)  Gastrointestinal Panel by PCR , Stool     Status: None   Collection Time: 05/13/24  9:49 PM   Specimen: Stool  Result Value Ref Range Status   Campylobacter species NOT DETECTED NOT DETECTED Final   Plesimonas shigelloides NOT DETECTED NOT DETECTED Final   Salmonella species NOT DETECTED NOT DETECTED Final   Yersinia enterocolitica NOT DETECTED NOT DETECTED Final   Vibrio species NOT DETECTED NOT DETECTED Final   Vibrio cholerae NOT DETECTED NOT DETECTED Final   Enteroaggregative E coli (EAEC) NOT DETECTED NOT DETECTED Final   Enteropathogenic E coli (EPEC) NOT DETECTED NOT DETECTED Final   Enterotoxigenic E coli (ETEC) NOT DETECTED NOT DETECTED Final   Shiga like toxin producing E coli (STEC) NOT DETECTED NOT DETECTED Final   Shigella/Enteroinvasive E coli (EIEC) NOT DETECTED NOT DETECTED Final   Cryptosporidium NOT DETECTED NOT DETECTED Final   Cyclospora cayetanensis NOT DETECTED NOT DETECTED Final   Entamoeba histolytica NOT DETECTED NOT DETECTED Final   Giardia lamblia NOT DETECTED NOT DETECTED Final   Adenovirus F40/41 NOT DETECTED NOT DETECTED Final   Astrovirus NOT DETECTED NOT  DETECTED Final   Norovirus GI/GII NOT DETECTED NOT DETECTED Final   Rotavirus A NOT DETECTED NOT DETECTED Final   Sapovirus (I, II, IV, and V) NOT DETECTED NOT DETECTED Final    Comment: Performed at [facility], [address] [place], [place] [address]    Procedures/Studies: CT ABDOMEN PELVIS W CONTRAST Result Date: 05/13/2024 EXAM: CT ABDOMEN AND PELVIS WITH CONTRAST 05/13/2024 03:44:47 PM TECHNIQUE: CT of the abdomen and pelvis was performed with the administration of 100 mL of iohexol  (OMNIPAQUE ) 300 MG/ML solution. Multiplanar reformatted images are provided for review. Automated exposure control, iterative reconstruction, and/or weight-based adjustment of the mA/kV was utilized to reduce the radiation dose to as low as reasonably achievable. COMPARISON: CT abdomen and pelvis 01/31/2002. CLINICAL HISTORY: Abdominal pain, acute, nonlocalized. FINDINGS: LOWER CHEST: No acute abnormality. LIVER: The liver is unremarkable. GALLBLADDER AND BILE DUCTS: The gallbladder is surgically absent. No biliary ductal dilatation. SPLEEN: No acute abnormality. PANCREAS: No acute abnormality. ADRENAL GLANDS: No acute abnormality. KIDNEYS, URETERS AND BLADDER: No stones in the kidneys or ureters. No hydronephrosis. No perinephric or periureteral stranding. Urinary bladder is unremarkable. GI AND BOWEL: Stomach demonstrates no acute abnormality. Appendix is not visualized. There is no bowel obstruction. PERITONEUM AND RETROPERITONEUM: No ascites. No free air. VASCULATURE: Aorta is normal in caliber. LYMPH NODES: No lymphadenopathy. REPRODUCTIVE ORGANS: Uterus is not visualized. BONES AND SOFT TISSUES: There is mild chronic compression deformities of L1 which is unchanged. No focal soft tissue abnormality. IMPRESSION: 1. No acute findings in the abdomen or pelvis. 2. Status post cholecystectomy. Electronically signed by: Greig Pique MD 05/13/2024 04:23 PM EST RP Workstation: [id number]     Time  coordinating discharge: Over 30 minutes    Alm Apo, MD  [facility] 05/14/2024, 6:13 PM

## 2024-05-21 ENCOUNTER — Inpatient Hospital Stay (HOSPITAL_BASED_OUTPATIENT_CLINIC_OR_DEPARTMENT_OTHER): Payer: MEDICAID | Admitting: Family Medicine

## 2024-05-27 ENCOUNTER — Other Ambulatory Visit: Payer: Self-pay

## 2024-05-27 ENCOUNTER — Other Ambulatory Visit (HOSPITAL_BASED_OUTPATIENT_CLINIC_OR_DEPARTMENT_OTHER): Payer: Self-pay

## 2024-05-29 ENCOUNTER — Other Ambulatory Visit (HOSPITAL_BASED_OUTPATIENT_CLINIC_OR_DEPARTMENT_OTHER): Payer: Self-pay

## 2024-06-02 ENCOUNTER — Emergency Department (HOSPITAL_BASED_OUTPATIENT_CLINIC_OR_DEPARTMENT_OTHER): Payer: MEDICAID

## 2024-06-02 ENCOUNTER — Inpatient Hospital Stay (HOSPITAL_BASED_OUTPATIENT_CLINIC_OR_DEPARTMENT_OTHER)
Admission: EM | Admit: 2024-06-02 | Discharge: 2024-06-04 | DRG: 897 | Disposition: A | Discharge status: Home / Self Care | Payer: MEDICAID | Attending: Internal Medicine | Admitting: Internal Medicine

## 2024-06-02 ENCOUNTER — Other Ambulatory Visit: Payer: Self-pay

## 2024-06-02 ENCOUNTER — Encounter (HOSPITAL_BASED_OUTPATIENT_CLINIC_OR_DEPARTMENT_OTHER): Payer: Self-pay

## 2024-06-02 DIAGNOSIS — R55 Syncope and collapse: Secondary | ICD-10-CM | POA: Diagnosis present

## 2024-06-02 DIAGNOSIS — R109 Unspecified abdominal pain: Secondary | ICD-10-CM | POA: Diagnosis not present

## 2024-06-02 DIAGNOSIS — R1110 Vomiting, unspecified: Secondary | ICD-10-CM

## 2024-06-02 DIAGNOSIS — R946 Abnormal results of thyroid function studies: Secondary | ICD-10-CM | POA: Diagnosis present

## 2024-06-02 DIAGNOSIS — Z79899 Other long term (current) drug therapy: Secondary | ICD-10-CM

## 2024-06-02 DIAGNOSIS — Z888 Allergy status to other drugs, medicaments and biological substances status: Secondary | ICD-10-CM

## 2024-06-02 DIAGNOSIS — D649 Anemia, unspecified: Secondary | ICD-10-CM | POA: Diagnosis present

## 2024-06-02 DIAGNOSIS — F1220 Cannabis dependence, uncomplicated: Secondary | ICD-10-CM | POA: Diagnosis present

## 2024-06-02 DIAGNOSIS — R1116 Cannabis hyperemesis syndrome: Secondary | ICD-10-CM | POA: Diagnosis not present

## 2024-06-02 DIAGNOSIS — E876 Hypokalemia: Secondary | ICD-10-CM | POA: Diagnosis present

## 2024-06-02 DIAGNOSIS — R112 Nausea with vomiting, unspecified: Secondary | ICD-10-CM

## 2024-06-02 DIAGNOSIS — F12288 Cannabis dependence with other cannabis-induced disorder: Principal | ICD-10-CM | POA: Diagnosis present

## 2024-06-02 DIAGNOSIS — I10 Essential (primary) hypertension: Secondary | ICD-10-CM | POA: Diagnosis present

## 2024-06-02 DIAGNOSIS — G894 Chronic pain syndrome: Secondary | ICD-10-CM | POA: Diagnosis present

## 2024-06-02 DIAGNOSIS — F17210 Nicotine dependence, cigarettes, uncomplicated: Secondary | ICD-10-CM | POA: Diagnosis present

## 2024-06-02 DIAGNOSIS — I493 Ventricular premature depolarization: Secondary | ICD-10-CM | POA: Diagnosis present

## 2024-06-02 DIAGNOSIS — E860 Dehydration: Secondary | ICD-10-CM | POA: Diagnosis present

## 2024-06-02 DIAGNOSIS — K5090 Crohn's disease, unspecified, without complications: Secondary | ICD-10-CM | POA: Diagnosis present

## 2024-06-02 DIAGNOSIS — Z90711 Acquired absence of uterus with remaining cervical stump: Secondary | ICD-10-CM

## 2024-06-02 DIAGNOSIS — Z9049 Acquired absence of other specified parts of digestive tract: Secondary | ICD-10-CM

## 2024-06-02 LAB — URINALYSIS, ROUTINE W REFLEX MICROSCOPIC
Bilirubin Urine: NEGATIVE
Glucose, UA: NEGATIVE mg/dL
Ketones, ur: 40 mg/dL — AB
Leukocytes,Ua: NEGATIVE
Nitrite: NEGATIVE
Protein, ur: NEGATIVE mg/dL
Specific Gravity, Urine: >=1.03 (ref 1.005–1.030)
pH: 7 (ref 5.0–8.0)

## 2024-06-02 LAB — COMPREHENSIVE METABOLIC PANEL WITH GFR
ALT: 12 U/L (ref 0–44)
ALT: 11 U/L (ref 0–44)
AST: 25 U/L (ref 15–41)
AST: 21 U/L (ref 15–41)
Albumin: 5.1 g/dL — ABNORMAL HIGH (ref 3.5–5.0)
Albumin: 4.5 g/dL (ref 3.5–5.0)
Alkaline Phosphatase: 89 U/L (ref 38–126)
Alkaline Phosphatase: 85 U/L (ref 38–126)
Anion gap: 18 — ABNORMAL HIGH (ref 5–15)
Anion gap: 14 (ref 5–15)
BUN: 16 mg/dL (ref 6–20)
BUN: 9 mg/dL (ref 6–20)
CO2: 18 mmol/L — ABNORMAL LOW (ref 22–32)
CO2: 18 mmol/L — ABNORMAL LOW (ref 22–32)
Calcium: 10.3 mg/dL (ref 8.9–10.3)
Calcium: 9.4 mg/dL (ref 8.9–10.3)
Chloride: 102 mmol/L (ref 98–111)
Chloride: 104 mmol/L (ref 98–111)
Creatinine, Ser: 1.02 mg/dL — ABNORMAL HIGH (ref 0.44–1.00)
Creatinine, Ser: 0.77 mg/dL (ref 0.44–1.00)
GFR, Estimated: >60 mL/min (ref >60)
GFR, Estimated: >60 mL/min (ref >60)
Glucose, Bld: 107 mg/dL — ABNORMAL HIGH (ref 70–99)
Glucose, Bld: 103 mg/dL — ABNORMAL HIGH (ref 70–99)
Potassium: 3.7 mmol/L (ref 3.5–5.1)
Potassium: 3.3 mmol/L — ABNORMAL LOW (ref 3.5–5.1)
Sodium: 138 mmol/L (ref 135–145)
Sodium: 136 mmol/L (ref 135–145)
Total Bilirubin: 0.6 mg/dL (ref 0.0–1.2)
Total Bilirubin: 0.6 mg/dL (ref 0.0–1.2)
Total Protein: 8.6 g/dL — ABNORMAL HIGH (ref 6.5–8.1)
Total Protein: 8.1 g/dL (ref 6.5–8.1)

## 2024-06-02 LAB — CBC WITH DIFFERENTIAL/PLATELET
Abs Immature Granulocytes: 0.04 K/uL (ref 0.00–0.07)
Basophils Absolute: 0 K/uL (ref 0.0–0.1)
Basophils Relative: 0 %
Eosinophils Absolute: 0 K/uL (ref 0.0–0.5)
Eosinophils Relative: 0 %
HCT: 41.9 % (ref 36.0–46.0)
Hemoglobin: 14 g/dL (ref 12.0–15.0)
Immature Granulocytes: 0 %
Lymphocytes Relative: 5 %
Lymphs Abs: 0.6 K/uL — ABNORMAL LOW (ref 0.7–4.0)
MCH: 32.1 pg (ref 26.0–34.0)
MCHC: 33.4 g/dL (ref 30.0–36.0)
MCV: 96.1 fL (ref 80.0–100.0)
Monocytes Absolute: 0.3 K/uL (ref 0.1–1.0)
Monocytes Relative: 2 %
Neutro Abs: 12.6 K/uL — ABNORMAL HIGH (ref 1.7–7.7)
Neutrophils Relative %: 93 %
Platelets: 324 K/uL (ref 150–400)
RBC: 4.36 MIL/uL (ref 3.87–5.11)
RDW: 13 % (ref 11.5–15.5)
WBC: 13.6 K/uL — ABNORMAL HIGH (ref 4.0–10.5)
nRBC: 0 % (ref 0.0–0.2)

## 2024-06-02 LAB — CBC
HCT: 43.7 % (ref 36.0–46.0)
Hemoglobin: 14.6 g/dL (ref 12.0–15.0)
MCH: 31.5 pg (ref 26.0–34.0)
MCHC: 33.4 g/dL (ref 30.0–36.0)
MCV: 94.4 fL (ref 80.0–100.0)
Platelets: 338 K/uL (ref 150–400)
RBC: 4.63 MIL/uL (ref 3.87–5.11)
RDW: 13.2 % (ref 11.5–15.5)
WBC: 13.1 K/uL — ABNORMAL HIGH (ref 4.0–10.5)
nRBC: 0 % (ref 0.0–0.2)

## 2024-06-02 LAB — BLOOD GAS, VENOUS
Acid-base deficit: 3.6 mmol/L — ABNORMAL HIGH (ref 0.0–2.0)
Bicarbonate: 20.4 mmol/L (ref 20.0–28.0)
O2 Saturation: 81.1 %
Patient temperature: 36.8
pCO2, Ven: 33 mmHg — ABNORMAL LOW (ref 44–60)
pH, Ven: 7.4 (ref 7.25–7.43)
pO2, Ven: 47 mmHg — ABNORMAL HIGH (ref 32–45)

## 2024-06-02 LAB — LIPASE, BLOOD: Lipase: 22 U/L (ref 11–51)

## 2024-06-02 LAB — URINALYSIS, MICROSCOPIC (REFLEX)

## 2024-06-02 LAB — CK: Total CK: 75 U/L (ref 38–234)

## 2024-06-02 LAB — CBG MONITORING, ED
Glucose-Capillary: 116 mg/dL — ABNORMAL HIGH (ref 70–99)
Glucose-Capillary: 99 mg/dL (ref 70–99)

## 2024-06-02 LAB — MAGNESIUM: Magnesium: 2 mg/dL (ref 1.7–2.4)

## 2024-06-02 LAB — PHOSPHORUS: Phosphorus: 1.5 mg/dL — ABNORMAL LOW (ref 2.5–4.6)

## 2024-06-02 MED ORDER — SODIUM CHLORIDE 0.9 % IV SOLN: INTRAVENOUS | Status: AC

## 2024-06-02 MED ORDER — HYDROCODONE-ACETAMINOPHEN 5-325 MG PO TABS: 1.0000 | ORAL_TABLET | ORAL | Status: DC | PRN

## 2024-06-02 MED ORDER — LORAZEPAM 2 MG/ML IJ SOLN
1.0000 mg | Freq: Once | INTRAMUSCULAR | Status: AC
  Administered 2024-06-02: 1 mg via INTRAVENOUS
  Filled 2024-06-02: qty 1

## 2024-06-02 MED ORDER — HYDROMORPHONE HCL 1 MG/ML IJ SOLN
0.5000 mg | INTRAMUSCULAR | Status: DC | PRN
  Administered 2024-06-02 – 2024-06-04 (×9): 0.5 mg via INTRAVENOUS
  Filled 2024-06-02 (×9): qty 0.5

## 2024-06-02 MED ORDER — HYDRALAZINE HCL 20 MG/ML IJ SOLN
5.0000 mg | Freq: Once | INTRAMUSCULAR | Status: AC
  Administered 2024-06-02: 5 mg via INTRAVENOUS
  Filled 2024-06-02: qty 1

## 2024-06-02 MED ORDER — SODIUM CHLORIDE 0.9 % IV BOLUS
1000.0000 mL | Freq: Once | INTRAVENOUS | Status: AC
  Administered 2024-06-02: 1000 mL via INTRAVENOUS

## 2024-06-02 MED ORDER — ACETAMINOPHEN 325 MG PO TABS: 650.0000 mg | ORAL_TABLET | Freq: Four times a day (QID) | ORAL | Status: DC | PRN

## 2024-06-02 MED ORDER — DICYCLOMINE HCL 20 MG PO TABS
20.0000 mg | ORAL_TABLET | Freq: Two times a day (BID) | ORAL | Status: DC
  Administered 2024-06-03 – 2024-06-04 (×3): 20 mg via ORAL
  Filled 2024-06-02 (×4): qty 1

## 2024-06-02 MED ORDER — ACETAMINOPHEN 500 MG PO TABS
1000.0000 mg | ORAL_TABLET | Freq: Once | ORAL | Status: DC
  Filled 2024-06-02: qty 2

## 2024-06-02 MED ORDER — POTASSIUM CHLORIDE 10 MEQ/100ML IV SOLN
10.0000 meq | INTRAVENOUS | Status: AC
  Administered 2024-06-02 – 2024-06-03 (×4): 10 meq via INTRAVENOUS
  Filled 2024-06-02 (×4): qty 100

## 2024-06-02 MED ORDER — HYDROMORPHONE HCL 1 MG/ML IJ SOLN
0.5000 mg | Freq: Once | INTRAMUSCULAR | Status: AC
  Administered 2024-06-02: 0.5 mg via INTRAVENOUS
  Filled 2024-06-02: qty 1

## 2024-06-02 MED ORDER — POTASSIUM PHOSPHATES 15 MMOLE/5ML IV SOLN
15.0000 mmol | Freq: Once | INTRAVENOUS | Status: AC
  Administered 2024-06-03: 15 mmol via INTRAVENOUS
  Filled 2024-06-02: qty 5

## 2024-06-02 MED ORDER — SODIUM CHLORIDE 0.9 % IV SOLN
12.5000 mg | Freq: Four times a day (QID) | INTRAVENOUS | Status: DC | PRN
  Administered 2024-06-02 – 2024-06-04 (×5): 12.5 mg via INTRAVENOUS
  Filled 2024-06-02 (×4): qty 12.5
  Filled 2024-06-02 (×2): qty 0.5

## 2024-06-02 MED ORDER — PROMETHAZINE HCL 25 MG/ML IJ SOLN
INTRAMUSCULAR | Status: AC
  Filled 2024-06-02: qty 1

## 2024-06-02 MED ORDER — ACETAMINOPHEN 650 MG RE SUPP: 650.0000 mg | Freq: Four times a day (QID) | RECTAL | Status: DC | PRN

## 2024-06-02 NOTE — Assessment & Plan Note (Signed)
Currently improving.

## 2024-06-02 NOTE — H&P (Addendum)
 Sherry Key FMW:[id number] DOB: [birth year] DOA: 06/02/2024     PCP: Knute Thersia Bitters, FNP       Patient arrived to ER on 06/02/24 at [phone] Referred by Attending Opyd, Evalene RAMAN, MD  Patient coming from:    home Lives  With family    Chief Complaint:   Chief Complaint  Patient presents with   Loss of Consciousness    HPI: Sherry Key is a 38 y.o. female with medical history significant of cyclic vomiting, cannabis hyperemesis syndrome, chronic pain  Presented with   syncope nausea vomiting  Patient with history of cyclic vomiting Has been having an episode since Saturday Went to work and had an episode of syncope hit his head and neck on a chair has been having a lot of nausea and dry heaves History of cannabis hyperemesis After the episode of passing out was momentary confused Describe some abdominal pain no diarrhea no urinary complaints history of endometriosis    Nausea and vomiting for past 5 days, no diarrhea, some constipation, no melena no blood in  stool  Patient reports a syncopal event she came to work yesterday Was standing up and had LOC even she denies prodrome States she hit the chair, with he left side of her body including her head  Her coworkers took her home She slept and when she woke up she had some nausea and left side was hurting, she says it was not weak but hurting She did feel out os sorts  Reports GI   bug has been going around with her sports mom group. At this time denies Neurological deficits but feels sore on the left side   Denies significant ETOH intake  Does not smoke THC 3 wks ago    Regarding pertinent Chronic problems:     HTN on chlorthalidone      Chronic anemia - baseline hg Hemoglobin & Hematocrit  Recent Labs    05/12/24 0722 05/13/24 0556 06/02/24 1030  HGB 14.2 10.4* 14.6   Iron/TIBC/Ferritin/ %Sat No results found for: IRON, TIBC, FERRITIN, IRONPCTSAT    While in ER: Clinical Course as  of 06/02/24 1941  Mon Jun 02, 2024  1026 Patient with history of cannabis hyperemesis  evaluated following syncopal episode that occurred yesterday in the setting of cyclical vomiting.  Primary complains of persistent vomiting, dizziness, left-sided rib neck and head pain.  Upon arrival patient is hypertensive and tachycardic.  Reports that she has been unable to keep down her blood pressure medications.  She is without any chest pain or shortness of breath.  Describes ongoing abdominal pain.  She has no neurodeficits on exam.  She is dry heaving during my examination.  [JT]  1052 CBC(!) Mild leukocytosis of 13.1 [JT]  1053 EKG 12-Lead Sinus tachycardia without ischemic changes, no evidence of Brugada, WPW or hypertrophic cardiomyopathy [JT]  1136 Comprehensive metabolic panel(!) Anion gap of 18, bicarb of 18 [JT]  1204 CT Head Wo Contrast No acute intracranial abnormality [JT]  1204 CT Cervical Spine Wo Contrast No acute finding [JT]  1218 DG Chest 2 View No abnormality [JT]  1314 Patient reevaluated.  Still continues to complain of nausea and pain.  Requests Dilaudid  specifically.  I explained to the patient that I am unable to provide any opiates for generalized pain without any clear etiology.  Will provide another dose of Ativan  and reevaluate. [JT]  1409 Urinalysis, Routine w reflex microscopic -Urine, Clean Catch(!) Ketones present [JT]  1432 No  improvement of symptoms.  Will pursue admission.  Hospitalist consulted [JT]  1538 Dr. Charlton agreed for admission [JT]    Clinical Course User Index [JT] Donnajean Lynwood DEL, PA-C       Lab Orders         Comprehensive metabolic panel         CBC         Urinalysis, Routine w reflex microscopic -Urine, Clean Catch         Lipase, blood         Urinalysis, Microscopic (reflex)         CBG monitoring, ED         CBG monitoring, ED      CT HEAD   NON acute CT neck degenerative's changes at C-spine    CXR -  NON acute    Following  Medications were ordered in ER: Medications  promethazine  (PHENERGAN ) 12.5 mg in sodium chloride  0.9 % 50 mL IVPB (0 mg Intravenous Stopped 06/02/24 1228)  acetaminophen  (TYLENOL ) tablet 1,000 mg (1,000 mg Oral Patient Refused/Not Given 06/02/24 1110)  sodium chloride  0.9 % bolus 1,000 mL (0 mLs Intravenous Stopped 06/02/24 1254)  LORazepam  (ATIVAN ) injection 1 mg (1 mg Intravenous Given 06/02/24 1038)  sodium chloride  0.9 % bolus 1,000 mL (0 mLs Intravenous Stopped 06/02/24 1400)  LORazepam  (ATIVAN ) injection 1 mg (1 mg Intravenous Given 06/02/24 1308)  HYDROmorphone  (DILAUDID ) injection 0.5 mg (0.5 mg Intravenous Given 06/02/24 1346)  hydrALAZINE  (APRESOLINE ) injection 5 mg (5 mg Intravenous Given 06/02/24 1643)      ED Triage Vitals  Encounter Vitals Group     BP 06/02/24 0952 (!) 147/128     Girls Systolic BP Percentile --      Girls Diastolic BP Percentile --      Boys Systolic BP Percentile --      Boys Diastolic BP Percentile --      Pulse Rate 06/02/24 0952 (!) 125     Resp 06/02/24 0952 (!) 22     Temp 06/02/24 0952 98 F (36.7 C)     Temp Source 06/02/24 1130 Oral     SpO2 06/02/24 0952 100 %     Weight --      Height --      Head Circumference --      Peak Flow --      Pain Score 06/02/24 0951 6     Pain Loc --      Pain Education --      Exclude from Growth Chart --   UFJK(75)@     _________________________________________ Significant initial  Findings: Abnormal Labs Reviewed  COMPREHENSIVE METABOLIC PANEL WITH GFR - Abnormal; Notable for the following components:      Result Value   CO2 18 (*)    Glucose, Bld 107 (*)    Creatinine, Ser 1.02 (*)    Total Protein 8.6 (*)    Albumin 5.1 (*)    Anion gap 18 (*)    All other components within normal limits  CBC - Abnormal; Notable for the following components:   WBC 13.1 (*)    All other components within normal limits  URINALYSIS, ROUTINE W REFLEX MICROSCOPIC - Abnormal; Notable for the following components:    Hgb urine dipstick TRACE (*)    Ketones, ur 40 (*)    All other components within normal limits  URINALYSIS, MICROSCOPIC (REFLEX) - Abnormal; Notable for the following components:   Bacteria, UA RARE (*)    All  other components within normal limits  CBG MONITORING, ED - Abnormal; Notable for the following components:   Glucose-Capillary 116 (*)    All other components within normal limits     ECG: Ordered Personally reviewed and interpreted by me showing: HR : 132 Rhythm:Sinus tachycardia Ventricular premature complex Aberrant complex Borderline right axis deviation Probable LVH with secondary repol abnrm QTC 460  The recent clinical data is shown below. Vitals:   06/02/24 1600 06/02/24 1630 06/02/24 1643 06/02/24 1818  BP: (!) 174/129 (!) 172/119 (!) 172/119 (!) 155/95  Pulse:    (!) 106  Resp:    18  Temp:    98.8 F (37.1 C)  TempSrc:    Oral  SpO2:    100%     WBC     Component Value Date/Time   WBC 13.1 (H) 06/02/2024 1030   LYMPHSABS 1.4 11/20/2023 0939   MONOABS 1.2 (H) 11/20/2023 0939   EOSABS 0.0 11/20/2023 0939   BASOSABS 0.0 11/20/2023 0939     UA   no evidence of UTI    Urine analysis:    Component Value Date/Time   COLORURINE YELLOW 06/02/2024 0957   APPEARANCEUR CLEAR 06/02/2024 0957   LABSPEC >=1.030 06/02/2024 0957   PHURINE 7.0 06/02/2024 0957   GLUCOSEU NEGATIVE 06/02/2024 0957   HGBUR TRACE (A) 06/02/2024 0957   BILIRUBINUR NEGATIVE 06/02/2024 0957   KETONESUR 40 (A) 06/02/2024 0957   PROTEINUR NEGATIVE 06/02/2024 0957   UROBILINOGEN 0.2 01/18/2015 1324   NITRITE NEGATIVE 06/02/2024 0957   LEUKOCYTESUR NEGATIVE 06/02/2024 0957    Results for orders placed or performed during the hospital encounter of 05/12/24  Gastrointestinal Panel by PCR , Stool     Status: None   Collection Time: 05/13/24  9:49 PM   Specimen: Stool  Result Value Ref Range Status   Campylobacter species NOT DETECTED NOT DETECTED Final   Plesimonas shigelloides  NOT DETECTED NOT DETECTED Final   Salmonella species NOT DETECTED NOT DETECTED Final   Yersinia enterocolitica NOT DETECTED NOT DETECTED Final   Vibrio species NOT DETECTED NOT DETECTED Final   Vibrio cholerae NOT DETECTED NOT DETECTED Final   Enteroaggregative E coli (EAEC) NOT DETECTED NOT DETECTED Final   Enteropathogenic E coli (EPEC) NOT DETECTED NOT DETECTED Final   Enterotoxigenic E coli (ETEC) NOT DETECTED NOT DETECTED Final   Shiga like toxin producing E coli (STEC) NOT DETECTED NOT DETECTED Final   Shigella/Enteroinvasive E coli (EIEC) NOT DETECTED NOT DETECTED Final   Cryptosporidium NOT DETECTED NOT DETECTED Final   Cyclospora cayetanensis NOT DETECTED NOT DETECTED Final   Entamoeba histolytica NOT DETECTED NOT DETECTED Final   Giardia lamblia NOT DETECTED NOT DETECTED Final   Adenovirus F40/41 NOT DETECTED NOT DETECTED Final   Astrovirus NOT DETECTED NOT DETECTED Final   Norovirus GI/GII NOT DETECTED NOT DETECTED Final   Rotavirus A NOT DETECTED NOT DETECTED Final   Sapovirus (I, II, IV, and V) NOT DETECTED NOT DETECTED Final    Comment: Performed at [facility], [address] [place], [place] [address]   ____________________________________________     pH  7.4 Acid-base deficit 3.6 High  mmol/L   pCO2, Ven 33 Low  mmHg O2 Saturation 81.1 %  pO2, Ven 47 High  mmHg       __________________________________________________________ Recent Labs  Lab 06/02/24 1030 06/02/24 2103  NA 138 136  K 3.7 3.3*  CO2 18* 18*  GLUCOSE 107* 103*  BUN 16 9  CREATININE 1.02* 0.77  CALCIUM 10.3 9.4  MG  --  2.0  PHOS  --  1.5*    Cr  Up from baseline see below Lab Results  Component Value Date   CREATININE 1.02 (H) 06/02/2024   CREATININE 0.79 05/14/2024   CREATININE 0.74 05/13/2024    Recent Labs  Lab 06/02/24 1030 06/02/24 2103  AST 25 21  ALT 12 11  ALKPHOS 89 85  BILITOT 0.6 0.6  PROT 8.6* 8.1  ALBUMIN 5.1* 4.5   Lab Results  Component Value  Date   CALCIUM 10.3 06/02/2024   PHOS 3.4 09/23/2023    Plt: Lab Results  Component Value Date   PLT 338 06/02/2024    Recent Labs  Lab 06/02/24 1030 06/02/24 2103  WBC 13.1* 13.6*  NEUTROABS  --  12.6*  HGB 14.6 14.0  HCT 43.7 41.9  MCV 94.4 96.1  PLT 338 324    HG/HCT   stable     Component Value Date/Time   HGB 14.6 06/02/2024 1030   HCT 43.7 06/02/2024 1030   MCV 94.4 06/02/2024 1030    Recent Labs  Lab 06/02/24 1030  LIPASE 22   No results for input(s): AMMONIA in the last 168 hours.    _______________________________________________ Hospitalist was called for admission for   Cannabis hyperemesis syndrome  Syncope, unspecified syncope type     The following Work up has been ordered so far:  Orders Placed This Encounter  Procedures   CT Head Wo Contrast   CT Cervical Spine Wo Contrast   DG Chest 2 View   Comprehensive metabolic panel   CBC   Urinalysis, Routine w reflex microscopic -Urine, Clean Catch   Lipase, blood   Urinalysis, Microscopic (reflex)   Diet NPO time specified   Document Height and Actual Weight   ED Cardiac monitoring   Cardiac Monitoring Continuous x 24 hours Indications for use: Syncope of unknown etiology   Consult to hospitalist   CBG monitoring, ED   CBG monitoring, ED   EKG 12-Lead   EKG   Place in observation (patient's expected length of stay will be less than 2 midnights)     OTHER Significant initial  Findings:  labs showing:     DM  labs:  HbA1C: No results for input(s): HGBA1C in the last 8760 hours.     CBG (last 3)  Recent Labs    06/02/24 1002 06/02/24 1652  GLUCAP 116* 99    Cultures:    Component Value Date/Time   SDES BLOOD SITE NOT SPECIFIED 11/19/2023 1357   SPECREQUEST  11/19/2023 1357    BOTTLES DRAWN AEROBIC ONLY Blood Culture results may not be optimal due to an inadequate volume of blood received in culture bottles   CULT  11/19/2023 1357    NO GROWTH 5 DAYS Performed at [facility], [address], [place], [place] [address]    REPTSTATUS 11/24/2023 FINAL 11/19/2023 1357     Radiological Exams on Admission: DG Chest 2 View Result Date: 06/02/2024 CLINICAL DATA:  Fall.  Chest pain.  Vomiting. EXAM: CHEST - 2 VIEW COMPARISON:  None Available. FINDINGS: The heart size and mediastinal contours are within normal limits. Both lungs are clear. No evidence of pneumothorax or hemothorax. The visualized skeletal structures are unremarkable. IMPRESSION: Normal exam. Electronically Signed   By: Norleen DELENA Kil M.D.   On: 06/02/2024 12:01   CT Cervical Spine Wo Contrast Result Date: 06/02/2024 EXAM: CT CERVICAL SPINE WITHOUT CONTRAST 06/02/2024 11:22:00 AM TECHNIQUE:  CT of the cervical spine was performed without the administration of intravenous contrast. Multiplanar reformatted images are provided for review. Automated exposure control, iterative reconstruction, and/or weight based adjustment of the mA/kV was utilized to reduce the radiation dose to as low as reasonably achievable. COMPARISON: 06/18/2023 CLINICAL HISTORY: Neck trauma, mechanically unstable spine (Age >= 16y) FINDINGS: CERVICAL SPINE: BONES AND ALIGNMENT: Straightening and slight reversal of the normal cervical lordosis. Trace retrolisthesis of C5 on C6. No evidence of traumatic malalignment. DEGENERATIVE CHANGES: Mild disc space narrowing and degenerative endplate osteophytes at the C5-C6 level. Disc osteophyte complexes at multiple levels. Asymmetric foraminal stenosis on the right at C5-C6. There is no high grade osseous spinal canal stenosis. SOFT TISSUES: No prevertebral soft tissue swelling. IMPRESSION: 1. No acute abnormality of the cervical spine. 2. Degenerative changes at C5-6. Electronically signed by: Donnice Mania MD 06/02/2024 11:53 AM EST RP Workstation: [id number]   CT Head Wo Contrast Result Date: 06/02/2024 EXAM: CT HEAD WITHOUT CONTRAST 06/02/2024 11:22:00 AM TECHNIQUE: CT of the head was  performed without the administration of intravenous contrast. Automated exposure control, iterative reconstruction, and/or weight based adjustment of the mA/kV was utilized to reduce the radiation dose to as low as reasonably achievable. COMPARISON: 02/01/2024 CLINICAL HISTORY: Head trauma, repeat vomiting (Age 33-64y) FINDINGS: BRAIN AND VENTRICLES: No acute hemorrhage. No evidence of acute infarct. No hydrocephalus. No extra-axial collection. No mass effect or midline shift. Partially empty sella. ORBITS: No acute abnormality. SINUSES: Mucosal thickening in bilateral maxillary sinuses. SOFT TISSUES AND SKULL: No acute soft tissue abnormality. No skull fracture. IMPRESSION: 1. No acute intracranial abnormality. Electronically signed by: Donnice Mania MD 06/02/2024 11:44 AM EST RP Workstation: [id number]   __________________________________________________________________________________________ Latest  Blood pressure (!) 155/95, pulse (!) 106, temperature 98.8 F (37.1 C), temperature source Oral, resp. rate 18, last menstrual period 10/19/2019, SpO2 100%.   Vitals  labs and radiology finding personally reviewed  Review of Systems:    Pertinent positives include:  abdominal pain, nausea, vomiting, syncope  Constitutional:  No weight loss, night sweats, Fevers, chills, fatigue, weight loss  HEENT:  No headaches, Difficulty swallowing,Tooth/dental problems,Sore throat,  No sneezing, itching, ear ache, nasal congestion, post nasal drip,  Cardio-vascular:  No chest pain, Orthopnea, PND, anasarca, dizziness, palpitations.no Bilateral lower extremity swelling  GI:  No heartburn, indigestion,  diarrhea, change in bowel habits, loss of appetite, melena, blood in stool, hematemesis Resp:  no shortness of breath at rest. No dyspnea on exertion, No excess mucus, no productive cough, No non-productive cough, No coughing up of blood.No change in color of mucus.No wheezing. Skin:  no rash or lesions. No  jaundice GU:  no dysuria, change in color of urine, no urgency or frequency. No straining to urinate.  No flank pain.  Musculoskeletal:  No joint pain or no joint swelling. No decreased range of motion. No back pain.  Psych:  No change in mood or affect. No depression or anxiety. No memory loss.  Neuro: no localizing neurological complaints, no tingling, no weakness, no double vision, no gait abnormality, no slurred speech, no confusion  All systems reviewed and apart from HOPI all are negative _______________________________________________________________________________________________ Past Medical History:   Past Medical History:  Diagnosis Date   Cannabinoid hyperemesis syndrome    Crohn's disease (HCC)    Cyclical vomiting    Drug-seeking behavior - pt seeking opiates specifically dilaudid  and IV phenergan . 09/24/2023   Endometriosis    Fibroids    History of partial hysterectomy 10/24/2019   Hypertension  PCOS (polycystic ovarian syndrome)      Past Surgical History:  Procedure Laterality Date   ABDOMINAL HYSTERECTOMY     CESAREAN SECTION     CESAREAN SECTION     CHOLECYSTECTOMY     HERNIA REPAIR     TONSILLECTOMY     TUBAL LIGATION     TUBAL LIGATION      Social History:  Ambulatory   independently      reports that she has been smoking cigarettes. She has never used smokeless tobacco. She reports current drug use. Drug: Marijuana. She reports that she does not drink alcohol.   Family History:   History reviewed. No pertinent family history. ______________________________________________________________________________________________ Allergies: Allergies  Allergen Reactions   Zofran  [Ondansetron  Hcl] Hives and Shortness Of Breath   Droperidol  Other (See Comments)    Tongue spasms, drooling, dystonia  Tongue spasm starts to stick out her tongue and wave it side-to-side.  Maintains airway, oxygenation okay, no respiratory distress no other symptoms.   Consider dystonic reaction vs volitional reaction.   Ketorolac  Other (See Comments)    Tongue spasms, drooling, dystonia   Peanut-Containing Drug Products Hives and Itching   Tomato Hives and Itching   Prochlorperazine  Other (See Comments)    Tongue spasms   Reglan  [Metoclopramide ] Other (See Comments)    Tongue spasms   Zofran  Hives and Itching   Haloperidol  Rash   Other Itching    Fuzzy fruit     Prior to Admission medications   Medication Sig Start Date End Date Taking? Authorizing Provider  acetaminophen  (TYLENOL ) 500 MG tablet Take 500 mg by mouth every 6 (six) hours as needed for moderate pain or mild pain.   Yes [provider]  dicyclomine  (BENTYL ) 20 MG tablet Take 1 tablet (20 mg total) by mouth 2 (two) times daily. 03/05/24  Yes Caudle, Thersia Bitters, FNP  diphenhydrAMINE  (BENADRYL ) 25 MG tablet Take 25 mg by mouth every 6 (six) hours as needed for allergies.   Yes [provider]  Multiple Vitamins-Minerals (CERTAVITE/ANTIOXIDANTS) TABS Take 1 tablet by mouth daily. 01/20/23  Yes Regalado, Belkys A, MD  promethazine  (PHENERGAN ) 25 MG suppository Place 1 suppository (25 mg total) rectally every 6 (six) hours as needed for nausea or vomiting. 02/01/24  Yes Neysa Caron PARAS, DO  promethazine  (PHENERGAN ) 25 MG tablet Take 1 tablet (25 mg total) by mouth every 6 (six) hours as needed for nausea or vomiting. 03/05/24  Yes Caudle, Thersia Bitters, FNP  topiramate  (TOPAMAX ) 25 MG tablet Take 1 tablet (25 mg total) by mouth at bedtime for 7 days, THEN 2 tablets (50 mg total) daily. 03/05/24 06/28/24 Yes Caudle, Thersia Bitters, FNP  chlorthalidone  (THALITONE ) 15 MG tablet Take 1 tablet (15 mg total) by mouth daily. 03/05/24   Caudle, Thersia Bitters, FNP  clotrimazole -betamethasone  (LOTRISONE ) cream Apply 1 Application topically daily. Patient not taking: Reported on 06/02/2024 01/02/24   Janit Thresa HERO, DPM     ___________________________________________________________________________________________________ Physical Exam:    06/02/2024    6:18 PM 06/02/2024    4:43 PM 06/02/2024    4:30 PM  Vitals with BMI  Systolic 155 172 827  Diastolic 95 119 119  Pulse 106      1. General:  in No  Acute distress   Chronically ill   -appearing 2. Psychological: Alert and   Oriented 3. Head/ENT:   Dry Mucous Membranes  Head Non traumatic, neck supple                          Poor Dentition 4. SKIN:  decreased Skin turgor,  Skin clean Dry and intact no rash    5. Heart: Regular rate and rhythm no  Murmur, no Rub or gallop 6. Lungs:   no wheezes or crackles   7. Abdomen: Soft,  non-tender, Non distended   bowel sounds present 8. Lower extremities: no clubbing, cyanosis, no  edema 9. Neurologically Grossly intact, moving all 4 extremities equally   10. MSK: Normal range of motion    Chart has been reviewed  ______________________________________________________________________________________________  Assessment/Plan  38 y.o. female with medical history significant of cyclic vomiting, cannabis hyperemesis syndrome, chronic pain  Admitted for   Cannabis hyperemesis syndrome  Syncope, unspecified syncope type   Present on Admission:  Intractable nausea and vomiting  Cannabis hyperemesis syndrome concurrent with and due to cannabis dependence (HCC)  Abdominal pain with vomiting  Hypokalemia  Hypophosphatemia  Syncope, vasovagal     Cannabis hyperemesis syndrome concurrent with and due to cannabis dependence (HCC) Supportive management rehydrate provide antiemetics Once patient's acute pain is improving would avoid narcotics as this can worsen the symptoms  Abdominal pain with vomiting Currently improving  Hypokalemia - will replace electrolytes and repeat  check Mg, phos and Ca level and replace as needed Monitor on telemetry   Lab Results  Component  Value Date   K 3.3 (L) 06/02/2024     Lab Results  Component Value Date   CREATININE 0.77 06/02/2024   Lab Results  Component Value Date   MG 2.0 06/02/2024   Lab Results  Component Value Date   CALCIUM 9.4 06/02/2024   PHOS 1.5 (L) 06/02/2024     Hypophosphatemia Will replace and recheck  Syncope, vasovagal In the setting of nausea vomiting versus orthostasis secondary to dehydration. Will rehydrate obtain orthostatics prior to discharge obtain echo for completion Monitor on telemetry given sudden event No hypoxia no CP but given initial tachycardia obtain d.dimer if elevated can obtain cta Check TSH  Other plan as per orders.  DVT prophylaxis:  SCD    Code Status:    Code Status: Prior FULL CODE as per patient  I had personally discussed CODE STATUS with patient  ACP   none   Family Communication:   Family not at  Bedside    Diet  Diet Orders (From admission, onward)     Start     Ordered   06/02/24 0957  Diet NPO time specified  Diet effective now        06/02/24 9042            Disposition Plan:      To home once workup is complete and patient is stable   Following barriers for discharge:                            Syncope  work up is complete                            Electrolytes corrected  Pain controlled with PO medications                                  Consult Orders  (From admission, onward)           Start     Ordered   06/02/24 1424  Consult to hospitalist  Called [facility] for consult to Hospitalist @14 :26.  Spoke with Debby  Once       Provider:  (Not yet assigned)  Question Answer Comment  Place call to: [facility]   Reason for Consult Admit      06/02/24 1423                        Consults called:    NONE   Admission status:  ED Disposition     ED Disposition  Admit   Condition  --   Comment  Hospital Area: [facility]  [[id number]]  Level of Care: Telemetry [5]  Admit to tele based on following criteria: Eval of Syncope  Admit to tele based on following criteria: Monitor QTC interval  Interfacility transfer: Yes  May place patient in observation at [facility] or [facility] if equivalent level of care is available:: Yes  Diagnosis: Intractable nausea and vomiting [720114]  Admitting Physician: CHARLTON EVALENE RAMAN [[id number]]  Attending Physician: CHARLTON EVALENE RAMAN [[id number]]           Obs     Level of care     tele  For   24H  Sajid Ruppert 06/03/2024, 12:26 AM    [facility]     after 2 AM please page floor coverage   If 7AM-7PM, please contact the day team taking care of the patient using [email or web address]

## 2024-06-02 NOTE — ED Notes (Signed)
Pt reminded of urine sample  

## 2024-06-02 NOTE — ED Notes (Signed)
 Called [facility] for transport to [facility] @15 :38.  Spoke with Nataya-

## 2024-06-02 NOTE — ED Notes (Signed)
 Pt has been off edibles/THC for 3x weeks. Still having random bouts of nausea. Pt is CAOx4 at bedside, dry heaving and drooling into an emesis bag. The pt was at work and got dizzy and blacked out. No obvious injury noted, airway intact, no bruising or swelling identified.

## 2024-06-02 NOTE — Assessment & Plan Note (Signed)
Will replace and recheck 

## 2024-06-02 NOTE — ED Notes (Signed)
[facility] on scene. 

## 2024-06-02 NOTE — ED Notes (Signed)
 Pt refused to attempt for a urine. Wanted to stay seated. Informed of the order to obtain by a cath if she won't try in the next few minutes.

## 2024-06-02 NOTE — Assessment & Plan Note (Signed)
 Supportive management rehydrate provide antiemetics Once patient's acute pain is improving would avoid narcotics as this can worsen the symptoms

## 2024-06-02 NOTE — Subjective & Objective (Signed)
 Patient with history of cyclic vomiting Has been having an episode since Saturday Went to work and had an episode of syncope hit his head and neck on a chair has been having a lot of nausea and dry heaves History of cannabis hyperemesis After the episode of passing out was momentary confused Describe some abdominal pain no diarrhea no urinary complaints history of endometriosis

## 2024-06-02 NOTE — Progress Notes (Signed)
 Plan of Care Note for accepted transfer   Patient: MARAJADE LEI MRN: [id number]   DOA: 06/02/2024  Facility requesting transfer: [facility]   Requesting Provider: Lynwood Roosevelt, PA  Reason for transfer: N/V, syncope   Facility course: 38 yr old female with cannabis hyperemesis syndrome and chronic pain presenting with N/V, lightheadedness, and syncope.   She is mildly tachycardic with stable BP. EKG demonstrates sinus tachycardia with PVC and LVH.   She was treated with 2 liters NS, Ativan  x2, Dilaudid , and phenergan .  She is still unable to tolerate anything by mouth.   Plan of care: The patient is accepted for admission to Telemetry unit, at [facility].   Author: Evalene GORMAN Sprinkles, MD 06/02/2024  Check [email or web address] for on-call coverage.  Nursing staff, Please call [facility] Admits & Consults System-Wide number on Amion as soon as patient's arrival, so appropriate admitting provider can evaluate the pt.

## 2024-06-02 NOTE — Assessment & Plan Note (Signed)
 In the setting of nausea vomiting versus orthostasis secondary to dehydration. Will rehydrate obtain orthostatics prior to discharge obtain echo for completion

## 2024-06-02 NOTE — ED Notes (Signed)
 Pt refused to take anything PO, even if it's for her abd pain.

## 2024-06-02 NOTE — ED Triage Notes (Addendum)
 Reports cyclic vomiting and on Saturday. Pt stating she went to work this morning and blacked out. Hit left side of head/neck area on chair. Pain and dizziness Pt alert and oriented x 4 Nauseated and Dry heaves

## 2024-06-02 NOTE — ED Provider Notes (Signed)
 [place] [facility] Provider Note   CSN: [id number] Arrival date & time: 06/02/24  9061     Patient presents with: Loss of Consciousness   Sherry Key is a 38 y.o. female history of cannabis hyperemesis, cyclical vomiting, endometriosis presents following syncopal episode that occurred yesterday.  Patient states that she had having a  episode of persistent vomiting in the days prior.  She was at work standing up when she suddenly collapsed.  This was a witnessed episode without any reported seizure-like activity.  Denied any urinary continence or tongue lacerations.  Was reportedly momentarily confused following the episode.  Has been ambulatory since.  Although she reports that since then she has been having dizziness and persistent vomiting with a left-sided headache, neck pain and rib pain.  Denies any chest pain or shortness of breath.  Describes generalized abdominal pain.  No diarrhea or urinary symptoms.    Loss of Consciousness     Past Medical History:  Diagnosis Date   Cannabinoid hyperemesis syndrome    Crohn's disease (HCC)    Cyclical vomiting    Drug-seeking behavior - pt seeking opiates specifically dilaudid  and IV phenergan . 09/24/2023   Endometriosis    Fibroids    History of partial hysterectomy 10/24/2019   Hypertension    PCOS (polycystic ovarian syndrome)    Past Surgical History:  Procedure Laterality Date   ABDOMINAL HYSTERECTOMY     CESAREAN SECTION     CESAREAN SECTION     CHOLECYSTECTOMY     HERNIA REPAIR     TONSILLECTOMY     TUBAL LIGATION     TUBAL LIGATION       Prior to Admission medications   Medication Sig Start Date End Date Taking? Authorizing Provider  acetaminophen  (TYLENOL ) 500 MG tablet Take 500 mg by mouth every 6 (six) hours as needed for moderate pain or mild pain.   Yes [provider]  dicyclomine  (BENTYL ) 20 MG tablet Take 1 tablet (20 mg total) by mouth 2 (two) times  daily. 03/05/24  Yes Caudle, Thersia Bitters, FNP  diphenhydrAMINE  (BENADRYL ) 25 MG tablet Take 25 mg by mouth every 6 (six) hours as needed for allergies.   Yes [provider]  Multiple Vitamins-Minerals (CERTAVITE/ANTIOXIDANTS) TABS Take 1 tablet by mouth daily. 01/20/23  Yes Regalado, Belkys A, MD  promethazine  (PHENERGAN ) 25 MG suppository Place 1 suppository (25 mg total) rectally every 6 (six) hours as needed for nausea or vomiting. 02/01/24  Yes Neysa Caron PARAS, DO  promethazine  (PHENERGAN ) 25 MG tablet Take 1 tablet (25 mg total) by mouth every 6 (six) hours as needed for nausea or vomiting. 03/05/24  Yes Caudle, Thersia Bitters, FNP  topiramate  (TOPAMAX ) 25 MG tablet Take 1 tablet (25 mg total) by mouth at bedtime for 7 days, THEN 2 tablets (50 mg total) daily. 03/05/24 06/28/24 Yes Caudle, Thersia Bitters, FNP  chlorthalidone  (THALITONE ) 15 MG tablet Take 1 tablet (15 mg total) by mouth daily. 03/05/24   Caudle, Thersia Bitters, FNP  clotrimazole -betamethasone  (LOTRISONE ) cream Apply 1 Application topically daily. Patient not taking: Reported on 06/02/2024 01/02/24   Janit Thresa HERO, DPM    Allergies: Zofran  [ondansetron  hcl], Droperidol , Ketorolac , Peanut-containing drug products, Tomato, Prochlorperazine , Reglan  [metoclopramide ], Zofran , Haloperidol , and Other    Review of Systems  Cardiovascular:  Positive for syncope.    Updated Vital Signs BP (!) 139/103   Pulse (!) 102   Temp 98 F (36.7 C)   Resp 14  LMP 10/19/2019   SpO2 100%   Physical Exam Vitals and nursing note reviewed.  Constitutional:      General: She is not in acute distress.    Appearance: She is well-developed.  HENT:     Head: Normocephalic and atraumatic.  Eyes:     Conjunctiva/sclera: Conjunctivae normal.  Cardiovascular:     Rate and Rhythm: Normal rate and regular rhythm.     Heart sounds: No murmur heard. Pulmonary:     Effort: Pulmonary effort is normal. No respiratory distress.     Breath  sounds: Normal breath sounds.  Abdominal:     Palpations: Abdomen is soft.     Tenderness: There is no abdominal tenderness.  Musculoskeletal:        General: No swelling.     Cervical back: Neck supple.     Comments: Generalized cervical tenderness, tenderness to left ribs and left clavicle, neurovasc intact, no gross deformities, tolerates full range of motion without discomfort  Skin:    General: Skin is warm and dry.     Capillary Refill: Capillary refill takes less than 2 seconds.  Neurological:     Mental Status: She is alert.  Psychiatric:        Mood and Affect: Mood normal.     (all labs ordered are listed, but only abnormal results are displayed) Labs Reviewed  COMPREHENSIVE METABOLIC PANEL WITH GFR - Abnormal; Notable for the following components:      Result Value   CO2 18 (*)    Glucose, Bld 107 (*)    Creatinine, Ser 1.02 (*)    Total Protein 8.6 (*)    Albumin 5.1 (*)    Anion gap 18 (*)    All other components within normal limits  CBC - Abnormal; Notable for the following components:   WBC 13.1 (*)    All other components within normal limits  URINALYSIS, ROUTINE W REFLEX MICROSCOPIC - Abnormal; Notable for the following components:   Hgb urine dipstick TRACE (*)    Ketones, ur 40 (*)    All other components within normal limits  URINALYSIS, MICROSCOPIC (REFLEX) - Abnormal; Notable for the following components:   Bacteria, UA RARE (*)    All other components within normal limits  CBG MONITORING, ED - Abnormal; Notable for the following components:   Glucose-Capillary 116 (*)    All other components within normal limits  LIPASE, BLOOD    EKG: EKG Interpretation Date/Time:  Monday June 02 2024 09:50:33 EST Ventricular Rate:  132 PR Interval:  119 QRS Duration:  84 QT Interval:  310 QTC Calculation: 460 R Axis:   92  Text Interpretation: Sinus tachycardia Ventricular premature complex Aberrant complex Borderline right axis deviation Probable  LVH with secondary repol abnrm Confirmed by Neysa Clap [phone]) on 06/02/2024 12:57:41 PM  Radiology: [facility] Chest 2 View Result Date: 06/02/2024 CLINICAL DATA:  Fall.  Chest pain.  Vomiting. EXAM: CHEST - 2 VIEW COMPARISON:  None Available. FINDINGS: The heart size and mediastinal contours are within normal limits. Both lungs are clear. No evidence of pneumothorax or hemothorax. The visualized skeletal structures are unremarkable. IMPRESSION: Normal exam. Electronically Signed   By: Norleen DELENA Kil M.D.   On: 06/02/2024 12:01   CT Cervical Spine Wo Contrast Result Date: 06/02/2024 EXAM: CT CERVICAL SPINE WITHOUT CONTRAST 06/02/2024 11:22:00 AM TECHNIQUE: CT of the cervical spine was performed without the administration of intravenous contrast. Multiplanar reformatted images are provided for review. Automated exposure control, iterative reconstruction, and/or  weight based adjustment of the mA/kV was utilized to reduce the radiation dose to as low as reasonably achievable. COMPARISON: 06/18/2023 CLINICAL HISTORY: Neck trauma, mechanically unstable spine (Age >= 16y) FINDINGS: CERVICAL SPINE: BONES AND ALIGNMENT: Straightening and slight reversal of the normal cervical lordosis. Trace retrolisthesis of C5 on C6. No evidence of traumatic malalignment. DEGENERATIVE CHANGES: Mild disc space narrowing and degenerative endplate osteophytes at the C5-C6 level. Disc osteophyte complexes at multiple levels. Asymmetric foraminal stenosis on the right at C5-C6. There is no high grade osseous spinal canal stenosis. SOFT TISSUES: No prevertebral soft tissue swelling. IMPRESSION: 1. No acute abnormality of the cervical spine. 2. Degenerative changes at C5-6. Electronically signed by: Donnice Mania MD 06/02/2024 11:53 AM EST RP Workstation: [id number]   CT Head Wo Contrast Result Date: 06/02/2024 EXAM: CT HEAD WITHOUT CONTRAST 06/02/2024 11:22:00 AM TECHNIQUE: CT of the head was performed without the administration of  intravenous contrast. Automated exposure control, iterative reconstruction, and/or weight based adjustment of the mA/kV was utilized to reduce the radiation dose to as low as reasonably achievable. COMPARISON: 02/01/2024 CLINICAL HISTORY: Head trauma, repeat vomiting (Age 43-64y) FINDINGS: BRAIN AND VENTRICLES: No acute hemorrhage. No evidence of acute infarct. No hydrocephalus. No extra-axial collection. No mass effect or midline shift. Partially empty sella. ORBITS: No acute abnormality. SINUSES: Mucosal thickening in bilateral maxillary sinuses. SOFT TISSUES AND SKULL: No acute soft tissue abnormality. No skull fracture. IMPRESSION: 1. No acute intracranial abnormality. Electronically signed by: Donnice Mania MD 06/02/2024 11:44 AM EST RP Workstation: [id number]     Procedures   Medications Ordered in the ED  promethazine  (PHENERGAN ) 12.5 mg in sodium chloride  0.9 % 50 mL IVPB (0 mg Intravenous Stopped 06/02/24 1228)  acetaminophen  (TYLENOL ) tablet 1,000 mg (1,000 mg Oral Patient Refused/Not Given 06/02/24 1110)  sodium chloride  0.9 % bolus 1,000 mL (0 mLs Intravenous Stopped 06/02/24 1254)  LORazepam  (ATIVAN ) injection 1 mg (1 mg Intravenous Given 06/02/24 1038)  sodium chloride  0.9 % bolus 1,000 mL (1,000 mLs Intravenous New Bag/Given 06/02/24 1253)  LORazepam  (ATIVAN ) injection 1 mg (1 mg Intravenous Given 06/02/24 1308)  HYDROmorphone  (DILAUDID ) injection 0.5 mg (0.5 mg Intravenous Given 06/02/24 1346)    Clinical Course as of 06/02/24 1539  Mon Jun 02, 2024  1026 Patient with history of cannabis hyperemesis  evaluated following syncopal episode that occurred yesterday in the setting of cyclical vomiting.  Primary complains of persistent vomiting, dizziness, left-sided rib neck and head pain.  Upon arrival patient is hypertensive and tachycardic.  Reports that she has been unable to keep down her blood pressure medications.  She is without any chest pain or shortness of breath.  Describes  ongoing abdominal pain.  She has no neurodeficits on exam.  She is dry heaving during my examination.  [JT]  1052 CBC(!) Mild leukocytosis of 13.1 [JT]  1053 EKG 12-Lead Sinus tachycardia without ischemic changes, no evidence of Brugada, WPW or hypertrophic cardiomyopathy [JT]  1136 Comprehensive metabolic panel(!) Anion gap of 18, bicarb of 18 [JT]  1204 CT Head Wo Contrast No acute intracranial abnormality [JT]  1204 CT Cervical Spine Wo Contrast No acute finding [JT]  1218 DG Chest 2 View No abnormality [JT]  1314 Patient reevaluated.  Still continues to complain of nausea and pain.  Requests Dilaudid  specifically.  I explained to the patient that I am unable to provide any opiates for generalized pain without any clear etiology.  Will provide another dose of Ativan  and reevaluate. [JT]  1409 Urinalysis, Routine w  reflex microscopic -Urine, Clean Catch(!) Ketones present [JT]  1432 No improvement of symptoms.  Will pursue admission.  Hospitalist consulted [JT]  1538 Dr. Charlton agreed for admission [JT]    Clinical Course User Index [JT] Donnajean Lynwood DEL, PA-C                                 Medical Decision Making Amount and/or Complexity of Data Reviewed Labs: ordered. Decision-making details documented in ED Course. Radiology: ordered. Decision-making details documented in ED Course. ECG/medicine tests:  Decision-making details documented in ED Course.  Risk OTC drugs. Prescription drug management. Decision regarding hospitalization.   This patient presents to the ED with chief complaint(s) of syncope.  The complaint involves an extensive differential diagnosis and also carries with it a high risk of complications and morbidity.   Pertinent past medical history as listed in HPI  The differential diagnosis includes  Intracranial hemorrhage, fracture, dislocation, arrhythmia, dehydration, electrolyte abnormality, acute abdominal pathology Additional history  obtained: Records reviewed Care Everywhere/External Records  Disposition:   Patient be admitted for further management.  Social Determinants of Health:   none  This note was dictated with voice recognition software.  Despite best efforts at proofreading, errors may have occurred which can change the documentation meaning.       Final diagnoses:  Cannabis hyperemesis syndrome  Syncope, unspecified syncope type    ED Discharge Orders     None          Donnajean Lynwood DEL, PA-C 06/02/24 1539    Neysa Caron PARAS, DO 06/03/24 [phone]

## 2024-06-02 NOTE — Assessment & Plan Note (Signed)
-   will replace electrolytes and repeat  check Mg, phos and Ca level and replace as needed Monitor on telemetry   Lab Results  Component Value Date   K 3.3 (L) 06/02/2024     Lab Results  Component Value Date   CREATININE 0.77 06/02/2024   Lab Results  Component Value Date   MG 2.0 06/02/2024   Lab Results  Component Value Date   CALCIUM 9.4 06/02/2024   PHOS 1.5 (L) 06/02/2024

## 2024-06-03 ENCOUNTER — Observation Stay (HOSPITAL_COMMUNITY): Payer: MEDICAID

## 2024-06-03 DIAGNOSIS — I10 Essential (primary) hypertension: Secondary | ICD-10-CM | POA: Diagnosis present

## 2024-06-03 DIAGNOSIS — I493 Ventricular premature depolarization: Secondary | ICD-10-CM | POA: Diagnosis present

## 2024-06-03 DIAGNOSIS — D649 Anemia, unspecified: Secondary | ICD-10-CM | POA: Diagnosis present

## 2024-06-03 DIAGNOSIS — E876 Hypokalemia: Secondary | ICD-10-CM | POA: Diagnosis present

## 2024-06-03 DIAGNOSIS — R946 Abnormal results of thyroid function studies: Secondary | ICD-10-CM | POA: Diagnosis present

## 2024-06-03 DIAGNOSIS — Z888 Allergy status to other drugs, medicaments and biological substances status: Secondary | ICD-10-CM | POA: Diagnosis not present

## 2024-06-03 DIAGNOSIS — R1116 Cannabis hyperemesis syndrome: Secondary | ICD-10-CM | POA: Diagnosis present

## 2024-06-03 DIAGNOSIS — Z79899 Other long term (current) drug therapy: Secondary | ICD-10-CM | POA: Diagnosis not present

## 2024-06-03 DIAGNOSIS — E860 Dehydration: Secondary | ICD-10-CM | POA: Diagnosis present

## 2024-06-03 DIAGNOSIS — R112 Nausea with vomiting, unspecified: Secondary | ICD-10-CM | POA: Diagnosis present

## 2024-06-03 DIAGNOSIS — K5090 Crohn's disease, unspecified, without complications: Secondary | ICD-10-CM | POA: Diagnosis present

## 2024-06-03 DIAGNOSIS — R55 Syncope and collapse: Secondary | ICD-10-CM | POA: Diagnosis present

## 2024-06-03 DIAGNOSIS — G894 Chronic pain syndrome: Secondary | ICD-10-CM | POA: Diagnosis present

## 2024-06-03 DIAGNOSIS — F1220 Cannabis dependence, uncomplicated: Secondary | ICD-10-CM | POA: Diagnosis not present

## 2024-06-03 DIAGNOSIS — F17210 Nicotine dependence, cigarettes, uncomplicated: Secondary | ICD-10-CM | POA: Diagnosis present

## 2024-06-03 DIAGNOSIS — Z90711 Acquired absence of uterus with remaining cervical stump: Secondary | ICD-10-CM | POA: Diagnosis not present

## 2024-06-03 DIAGNOSIS — Z9049 Acquired absence of other specified parts of digestive tract: Secondary | ICD-10-CM | POA: Diagnosis not present

## 2024-06-03 DIAGNOSIS — F12288 Cannabis dependence with other cannabis-induced disorder: Secondary | ICD-10-CM | POA: Diagnosis present

## 2024-06-03 LAB — COMPREHENSIVE METABOLIC PANEL WITH GFR
ALT: 8 U/L (ref 0–44)
AST: 17 U/L (ref 15–41)
Albumin: 4 g/dL (ref 3.5–5.0)
Alkaline Phosphatase: 75 U/L (ref 38–126)
Anion gap: 13 (ref 5–15)
BUN: 8 mg/dL (ref 6–20)
CO2: 19 mmol/L — ABNORMAL LOW (ref 22–32)
Calcium: 8.9 mg/dL (ref 8.9–10.3)
Chloride: 105 mmol/L (ref 98–111)
Creatinine, Ser: 0.75 mg/dL (ref 0.44–1.00)
GFR, Estimated: >60 mL/min (ref >60)
Glucose, Bld: 78 mg/dL (ref 70–99)
Potassium: 3.4 mmol/L — ABNORMAL LOW (ref 3.5–5.1)
Sodium: 137 mmol/L (ref 135–145)
Total Bilirubin: 0.6 mg/dL (ref 0.0–1.2)
Total Protein: 7.3 g/dL (ref 6.5–8.1)

## 2024-06-03 LAB — T4, FREE: Free T4: 1 ng/dL (ref 0.61–1.12)

## 2024-06-03 LAB — ECHOCARDIOGRAM COMPLETE
Area-P 1/2: 3.83 cm2
MV M vel: 3.8 m/s
MV Peak grad: 57.8 mmHg
S' Lateral: 2.7 cm
Single Plane A4C EF: 58.9 %

## 2024-06-03 LAB — TSH: TSH: 0.16 u[IU]/mL — ABNORMAL LOW (ref 0.350–4.500)

## 2024-06-03 LAB — TROPONIN T, HIGH SENSITIVITY
Troponin T High Sensitivity: <15 ng/L (ref 0–19)
Troponin T High Sensitivity: <15 ng/L (ref 0–19)

## 2024-06-03 LAB — URINE DRUG SCREEN
Amphetamines: NEGATIVE
Barbiturates: NEGATIVE
Benzodiazepines: POSITIVE — AB
Cocaine: NEGATIVE
Fentanyl: NEGATIVE
Methadone Scn, Ur: NEGATIVE
Opiates: POSITIVE — AB
Tetrahydrocannabinol: POSITIVE — AB

## 2024-06-03 LAB — CBC
HCT: 38.3 % (ref 36.0–46.0)
Hemoglobin: 12.5 g/dL (ref 12.0–15.0)
MCH: 31.6 pg (ref 26.0–34.0)
MCHC: 32.6 g/dL (ref 30.0–36.0)
MCV: 96.7 fL (ref 80.0–100.0)
Platelets: 305 K/uL (ref 150–400)
RBC: 3.96 MIL/uL (ref 3.87–5.11)
RDW: 13.1 % (ref 11.5–15.5)
WBC: 12.9 K/uL — ABNORMAL HIGH (ref 4.0–10.5)
nRBC: 0 % (ref 0.0–0.2)

## 2024-06-03 LAB — D-DIMER, QUANTITATIVE: D-Dimer, Quant: 0.27 ug{FEU}/mL (ref 0.00–0.50)

## 2024-06-03 LAB — MAGNESIUM: Magnesium: 2 mg/dL (ref 1.7–2.4)

## 2024-06-03 LAB — PHOSPHORUS: Phosphorus: 2.1 mg/dL — ABNORMAL LOW (ref 2.5–4.6)

## 2024-06-03 MED ORDER — SODIUM CHLORIDE 0.9 % IV SOLN: INTRAVENOUS | Status: DC

## 2024-06-03 NOTE — TOC Initial Note (Signed)
 Transition of Care [facility]) - Initial/Assessment Note    Patient Details  Name: Sherry Key MRN: [id number] Date of Birth: 1985-07-16  [facility] CM/SW Contact:    Heather DELENA Saltness, LCSW Phone Number: 06/03/2024, 10:37 AM  Clinical Narrative:                 Pt admitted to the hospital due to loss of consciousness, nausea, and vomiting. Pt with prior medical history of cyclic vomiting, cannabis hyperemesis syndrome, and chronic pain. Pt lives in single family home in [place]. Pt reports she hasn't used marijuana in three weeks; pt also denies alcohol and illicit drug use. TOC will continue to follow.   Expected Discharge Plan: Home/Self Care Barriers to Discharge: Continued Medical Work up   Patient Goals and CMS Choice Patient states their goals for this hospitalization and ongoing recovery are:: To return home        Expected Discharge Plan and Services In-house Referral: Clinical Social Work Discharge Planning Services: NA Post Acute Care Choice: NA Living arrangements for the past 2 months: Single Family Home                 DME Arranged: N/A DME Agency: NA       HH Arranged: NA HH Agency: NA        Prior Living Arrangements/Services Living arrangements for the past 2 months: Single Family Home Lives with:: Self, Minor Children Patient language and need for interpreter reviewed:: Yes Do you feel safe going back to the place where you live?: Yes      Need for Family Participation in Patient Care: No (Comment) Care giver support system in place?: No (comment)   Criminal Activity/Legal Involvement Pertinent to Current Situation/Hospitalization: No - Comment as needed  Activities of Daily Living      Permission Sought/Granted   Permission granted to share information with : No  Emotional Assessment Appearance:: Appears stated age Attitude/Demeanor/Rapport: Unable to Assess Affect (typically observed): Unable to Assess Orientation: :  Oriented to Self, Oriented to Place, Oriented to  Time, Oriented to Situation Alcohol / Substance Use: Not Applicable Psych Involvement: No (comment)  Admission diagnosis:  Intractable nausea and vomiting [R11.2] Syncope, unspecified syncope type [R55] Cannabis hyperemesis syndrome [R11.16] Patient Active Problem List   Diagnosis Date Noted   Intractable nausea and vomiting 06/02/2024   Hypophosphatemia 06/02/2024   Syncope, vasovagal 06/02/2024   PCOS (polycystic ovarian syndrome) 05/12/2024   Hyperproteinemia 05/12/2024   Drug-seeking behavior - pt seeking opiates specifically dilaudid  and IV phenergan . 09/24/2023   Cannabis hyperemesis syndrome concurrent with and due to cannabis dependence (HCC) 09/23/2023   Diarrhea 09/23/2023   Accelerated hypertension 09/22/2023   Hypokalemia 09/22/2023   Idiopathic intracranial hypertension 09/22/2023   Nausea & vomiting 09/20/2023   Abdominal pain with vomiting 01/16/2023   Leukocytosis 01/16/2023   S/P laparoscopic hysterectomy 10/25/2019   Normocytic anemia 07/28/2019   Pneumomediastinum (HCC) 07/27/2019   External hemorrhoids 02/07/2017   Supraumbilical hernia 07/19/2016   S/P hernia repair 04/16/2015   Cannabis abuse 09/27/2014   Chronic pain syndrome 08/05/2014   PCP:  Knute Thersia Bitters, FNP Pharmacy:   [facility] [id number] ([address]),  - [address] [place] ([place]) [place] [address] Phone: [phone] Fax: [phone]   - [facility] [address] [place] [address] Phone: [phone] Fax: [phone]  [facility] [address] [place] [address] Phone: [phone] Fax: [phone]  [facility] [address] [place] [address] Phone: [phone] Fax: [phone]     Social Drivers of Health (SDOH) Social History: SDOH  Screenings   Food Insecurity: Unknown (05/14/2024)  Housing: Unknown (05/14/2024)  Transportation Needs: Unknown (05/14/2024)  Utilities: Not At Risk (05/14/2024)  Depression (PHQ2-9): High Risk (03/05/2024)  Social Connections: Unknown (11/15/2021)   Received from [facility]  Tobacco Use: High Risk (06/02/2024)   SDOH Interventions: None     Readmission Risk Interventions     No data to display          Signed: Heather Saltness, MSW, LCSW Clinical Social Worker Inpatient Care Management 06/03/2024 10:43 AM

## 2024-06-03 NOTE — Plan of Care (Signed)

## 2024-06-03 NOTE — Progress Notes (Signed)
  Echocardiogram 2D Echocardiogram has been performed.  Koleen KANDICE Popper, RDCS 06/03/2024, 9:54 AM

## 2024-06-03 NOTE — Progress Notes (Signed)
       Overnight   NAME: SURYA FOLDEN MRN: [id number] DOB : Dec 05, 1985    Date of Service   06/03/2024   HPI/Events of Note    Notified by Admitting Physician for lab value follow up   D-dimer    Latest Reference Range & Units 06/03/24 05:28  D-Dimer, Quant 0.00 - 0.50 ug/mL-FEU 0.27    Interventions/ Plan   Continue Attending orders       Lynwood Kipper BSN MSNA MSN ACNPC-AG Acute Care Nurse Practitioner [facility]

## 2024-06-03 NOTE — Progress Notes (Addendum)
 Pt still has not provided a urine sample, will relay to Day Shift nurse during bedside shift report. . . Specimen container is in the bathroom, pt is aware .

## 2024-06-03 NOTE — Progress Notes (Signed)
 Pt states she does not feel the urge to void when asked for a urine sample , Will try again later . SABRA SABRA

## 2024-06-03 NOTE — Progress Notes (Signed)
 PROGRESS NOTE  Sherry Key  FMW:[id number] DOB: [birth year] DOA: 06/02/2024 PCP: Knute Thersia Bitters, FNP   Brief Narrative:  Patient is a 38 year old female with history of cannabinoid hyperemesis syndrome, cyclical vomiting, Crohn's disease, drug-seeking behavior, chronic pain syndrome endometriosis status post partial hysterectomy, chronic headache who presented with nausea, vomiting, syncopal episode.  She was recently admitted for intractable nausea and vomiting thought to be secondary to marijuana use.  At that time, CT abdomen/pelvis did not show any acute findings.  On presentation, she was hemodynamically stable.  Lab work showed mild leukocytosis of 13.1.  Echo was done for syncopal workup which shows normal EF, no valvular abnormalities.  UA was not suspicious for UTI.  Plan to help with the diet for liquid today.  Assessment & Plan:  Active Problems:   Cannabis hyperemesis syndrome concurrent with and due to cannabis dependence (HCC)   Abdominal pain with vomiting   Hypokalemia   Intractable nausea and vomiting   Hypophosphatemia   Syncope, vasovagal   Intractable nausea/vomiting/abdominal pain: History of cannabinoid hyperemesis syndrome.  Has been in the emergency department numerous times for this.  Has chronic abdominal pain.  She says she uses cannabis for her abdominal pain.  She says her last cannabis use was about 3 weeks ago.  She is admitted for intractable nausea and vomiting and was discharged on 11/5.  At that time CT abdomen/pelvis did not show any acute findings. Continue gentle IV fluids, antiemetics Counseled to stop cannabis. Will start her on full liquid diet.  Continue phenergan  as needed for nausea and vomiting  Syncope: Likely vasovagal in the setting of dehydration from nausea and vomiting versus orthostasis.  Echocardiogram showed EF of 55 to 60%, normal left ventricular diastolic parameters, normal right ventricle systolic function.  Orthostatic  vitals taken this morning have been negative  Hypokalemia/hypophosphatemia: continue to monitor and supplement as needed   Diarrhea:  Abnormal thyroid  test: TSH is low.  Will check free T3 and T4   Hypertension: Takes chlorthalidone  at home.  Not restarted yet.  If she continues to remain hypertensive, will restart it          DVT prophylaxis:SCDs Start: 06/02/24 2008     Code Status: Full Code  Family Communication: None at bedside  Patient status:Obs  Patient is from :Home  Anticipated discharge un:Ynfz  Estimated DC date:1-2 days   Consultants: None  Procedures:None  Antimicrobials:  Anti-infectives (From admission, onward)    None       Subjective: Patient seen and examined at bedside today.  Overall comfortable.  Lying in bed.  Denies any vomiting today.  Has some nausea and complains of some abdominal discomfort.  Abdomen is completely nontender on examination today.  Soft and nondistended.  We discussed about starting on full liquid diet.  Objective: Vitals:   06/02/24 1818 06/02/24 1954 06/03/24 0137 06/03/24 0515  BP: (!) 155/95 (!) 152/124 117/87 (!) 135/97  Pulse: (!) 106 97 86 88  Resp: 18  18 18   Temp: 98.8 F (37.1 C) 98.5 F (36.9 C) 97.6 F (36.4 C) 97.7 F (36.5 C)  TempSrc: Oral Oral Oral Oral  SpO2: 100% 100% 100% 100%    Intake/Output Summary (Last 24 hours) at 06/03/2024 1037 Last data filed at 06/03/2024 9060 Gross per 24 hour  Intake 3661.5 ml  Output 200 ml  Net 3461.5 ml   There were no vitals filed for this visit.  Examination:  General exam: Overall comfortable, not in distress HEENT:  PERRL Respiratory system:  no wheezes or crackles  Cardiovascular system: S1 & S2 heard, RRR.  Gastrointestinal system: Abdomen is nondistended, soft and nontender. Central nervous system: Alert and oriented Extremities: No edema, no clubbing ,no cyanosis Skin: No rashes, no ulcers,no icterus     Data Reviewed: I have personally  reviewed following labs and imaging studies  CBC: Recent Labs  Lab 06/02/24 1030 06/02/24 2103 06/03/24 0528  WBC 13.1* 13.6* 12.9*  NEUTROABS  --  12.6*  --   HGB 14.6 14.0 12.5  HCT 43.7 41.9 38.3  MCV 94.4 96.1 96.7  PLT 338 324 305   Basic Metabolic Panel: Recent Labs  Lab 06/02/24 1030 06/02/24 2103 06/03/24 0528  NA 138 136 137  K 3.7 3.3* 3.4*  CL 102 104 105  CO2 18* 18* 19*  GLUCOSE 107* 103* 78  BUN 16 9 8   CREATININE 1.02* 0.77 0.75  CALCIUM 10.3 9.4 8.9  MG  --  2.0 2.0  PHOS  --  1.5* 2.1*     No results found for this or any previous visit (from the past 240 hours).   Radiology Studies: ECHOCARDIOGRAM COMPLETE Result Date: 06/03/2024    ECHOCARDIOGRAM REPORT   Patient Name:   Sherry Key Date of Exam: 06/03/2024 Medical Rec #:  [id number]          Height:       63.0 in Accession #:    [id number]         Weight:       160.0 lb Date of Birth:  03/24/1986           BSA:          1.759 m Patient Age:    38 years           BP:           135/97 mmHg Patient Gender: F                  HR:           63 bpm. Exam Location:  Inpatient Procedure: 2D Echo, 3D Echo, Color Doppler, Cardiac Doppler and Strain Analysis            (Both Spectral and Color Flow Doppler were utilized during            procedure). Indications:    Syncope R55  History:        Patient has no prior history of Echocardiogram examinations.                 Signs/Symptoms:Syncope; Risk Factors:Current Smoker and                 Hypertension.  Sonographer:    Koleen Popper RDCS Referring Phys: VERGIE ALES DOUTOVA  Sonographer Comments: Global longitudinal strain was attempted. IMPRESSIONS  1. Left ventricular ejection fraction, by estimation, is 55 to 60%. The left ventricle has normal function. The left ventricle has no regional wall motion abnormalities. Left ventricular diastolic parameters were normal. The average left ventricular global longitudinal strain is -20.8 %. The global longitudinal  strain is normal.  2. Right ventricular systolic function is normal. The right ventricular size is normal. There is normal pulmonary artery systolic pressure. The estimated right ventricular systolic pressure is 25.7 mmHg.  3. The mitral valve is grossly normal. Trivial mitral valve regurgitation. No evidence of mitral stenosis.  4. The aortic valve is tricuspid. Aortic valve regurgitation is not visualized. No aortic stenosis  is present.  5. The inferior vena cava is normal in size with greater than 50% respiratory variability, suggesting right atrial pressure of 3 mmHg. FINDINGS  Left Ventricle: Left ventricular ejection fraction, by estimation, is 55 to 60%. The left ventricle has normal function. The left ventricle has no regional wall motion abnormalities. The average left ventricular global longitudinal strain is -20.8 %. Strain was performed and the global longitudinal strain is normal. 3D ejection fraction reviewed and evaluated as part of the interpretation. Alternate measurement of EF is felt to be most reflective of LV function. The left ventricular internal cavity size was normal in size. There is no left ventricular hypertrophy. Left ventricular diastolic parameters were normal. Right Ventricle: The right ventricular size is normal. No increase in right ventricular wall thickness. Right ventricular systolic function is normal. There is normal pulmonary artery systolic pressure. The tricuspid regurgitant velocity is 2.38 m/s, and  with an assumed right atrial pressure of 3 mmHg, the estimated right ventricular systolic pressure is 25.7 mmHg. Left Atrium: Left atrial size was normal in size. Right Atrium: Right atrial size was normal in size. Pericardium: There is no evidence of pericardial effusion. Mitral Valve: The mitral valve is grossly normal. Trivial mitral valve regurgitation. No evidence of mitral valve stenosis. Tricuspid Valve: The tricuspid valve is grossly normal. Tricuspid valve  regurgitation is mild . No evidence of tricuspid stenosis. Aortic Valve: The aortic valve is tricuspid. Aortic valve regurgitation is not visualized. No aortic stenosis is present. Pulmonic Valve: The pulmonic valve was grossly normal. Pulmonic valve regurgitation is mild. No evidence of pulmonic stenosis. Aorta: The aortic root and ascending aorta are structurally normal, with no evidence of dilitation. Venous: The right lower pulmonary vein is normal. The inferior vena cava is normal in size with greater than 50% respiratory variability, suggesting right atrial pressure of 3 mmHg. IAS/Shunts: The atrial septum is grossly normal. Additional Comments: 3D was performed not requiring image post processing on an independent workstation and was indeterminate.  LEFT VENTRICLE PLAX 2D LVIDd:         4.00 cm      Diastology LVIDs:         2.70 cm      LV e' medial:    8.27 cm/s LV PW:         1.00 cm      LV E/e' medial:  8.4 LV IVS:        1.10 cm      LV e' lateral:   11.20 cm/s LVOT diam:     1.90 cm      LV E/e' lateral: 6.2 LV SV:         52 LV SV Index:   29           2D Longitudinal Strain LVOT Area:     2.84 cm     2D Strain GLS Avg:     -20.8 %  LV Volumes (MOD) LV vol d, MOD A4C: 128.0 ml 3D Volume EF: LV vol s, MOD A4C: 52.6 ml  3D EF:        63 % LV SV MOD A4C:     128.0 ml LV EDV:       145 ml                             LV ESV:       54 ml  LV SV:        90 ml RIGHT VENTRICLE             IVC RV Basal diam:  3.60 cm     IVC diam: 1.60 cm RV S prime:     11.70 cm/s TAPSE (M-mode): 2.2 cm LEFT ATRIUM             Index        RIGHT ATRIUM          Index LA diam:        2.80 cm 1.59 cm/m   RA Area:     9.26 cm LA Vol (A2C):   27.2 ml 15.47 ml/m  RA Volume:   18.30 ml 10.41 ml/m LA Vol (A4C):   36.7 ml 20.87 ml/m LA Biplane Vol: 33.3 ml 18.93 ml/m  AORTIC VALVE LVOT Vmax:   88.70 cm/s LVOT Vmean:  56.100 cm/s LVOT VTI:    0.182 m  AORTA Ao Root diam: 3.40 cm Ao Asc diam:  2.90 cm  MITRAL VALVE               TRICUSPID VALVE MV Area (PHT): 3.83 cm    TR Peak grad:   22.7 mmHg MV Decel Time: 198 msec    TR Mean grad:   15.0 mmHg MR Peak grad: 57.8 mmHg    TR Vmax:        238.00 cm/s MR Vmax:      380.00 cm/s  TR Vmean:       187.0 cm/s MV E velocity: 69.80 cm/s MV A velocity: 61.30 cm/s  SHUNTS MV E/A ratio:  1.14        Systemic VTI:  0.18 m                            Systemic Diam: 1.90 cm Darryle Decent MD Electronically signed by Darryle Decent MD Signature Date/Time: 06/03/2024/10:26:06 AM    Final    DG Chest 2 View Result Date: 06/02/2024 CLINICAL DATA:  Fall.  Chest pain.  Vomiting. EXAM: CHEST - 2 VIEW COMPARISON:  None Available. FINDINGS: The heart size and mediastinal contours are within normal limits. Both lungs are clear. No evidence of pneumothorax or hemothorax. The visualized skeletal structures are unremarkable. IMPRESSION: Normal exam. Electronically Signed   By: Norleen DELENA Kil M.D.   On: 06/02/2024 12:01   CT Cervical Spine Wo Contrast Result Date: 06/02/2024 EXAM: CT CERVICAL SPINE WITHOUT CONTRAST 06/02/2024 11:22:00 AM TECHNIQUE: CT of the cervical spine was performed without the administration of intravenous contrast. Multiplanar reformatted images are provided for review. Automated exposure control, iterative reconstruction, and/or weight based adjustment of the mA/kV was utilized to reduce the radiation dose to as low as reasonably achievable. COMPARISON: 06/18/2023 CLINICAL HISTORY: Neck trauma, mechanically unstable spine (Age >= 16y) FINDINGS: CERVICAL SPINE: BONES AND ALIGNMENT: Straightening and slight reversal of the normal cervical lordosis. Trace retrolisthesis of C5 on C6. No evidence of traumatic malalignment. DEGENERATIVE CHANGES: Mild disc space narrowing and degenerative endplate osteophytes at the C5-C6 level. Disc osteophyte complexes at multiple levels. Asymmetric foraminal stenosis on the right at C5-C6. There is no high grade osseous spinal canal  stenosis. SOFT TISSUES: No prevertebral soft tissue swelling. IMPRESSION: 1. No acute abnormality of the cervical spine. 2. Degenerative changes at C5-6. Electronically signed by: Donnice Mania MD 06/02/2024 11:53 AM EST RP Workstation: [id number]   CT Head Wo Contrast Result Date: 06/02/2024 EXAM:  CT HEAD WITHOUT CONTRAST 06/02/2024 11:22:00 AM TECHNIQUE: CT of the head was performed without the administration of intravenous contrast. Automated exposure control, iterative reconstruction, and/or weight based adjustment of the mA/kV was utilized to reduce the radiation dose to as low as reasonably achievable. COMPARISON: 02/01/2024 CLINICAL HISTORY: Head trauma, repeat vomiting (Age 51-64y) FINDINGS: BRAIN AND VENTRICLES: No acute hemorrhage. No evidence of acute infarct. No hydrocephalus. No extra-axial collection. No mass effect or midline shift. Partially empty sella. ORBITS: No acute abnormality. SINUSES: Mucosal thickening in bilateral maxillary sinuses. SOFT TISSUES AND SKULL: No acute soft tissue abnormality. No skull fracture. IMPRESSION: 1. No acute intracranial abnormality. Electronically signed by: Donnice Mania MD 06/02/2024 11:44 AM EST RP Workstation: [id number]    Scheduled Meds:  acetaminophen   1,000 mg Oral Once   dicyclomine   20 mg Oral BID   Continuous Infusions:  potassium PHOSPHATE  IVPB (in mmol) 15 mmol (06/03/24 0630)   promethazine  (PHENERGAN ) injection (IM or IVPB) 12.5 mg (06/03/24 0904)     LOS: 0 days   Ivonne Mustache, MD [facility] P11/25/2025, 10:37 AM

## 2024-06-04 ENCOUNTER — Other Ambulatory Visit (HOSPITAL_COMMUNITY): Payer: Self-pay

## 2024-06-04 DIAGNOSIS — F1220 Cannabis dependence, uncomplicated: Secondary | ICD-10-CM

## 2024-06-04 DIAGNOSIS — R1116 Cannabis hyperemesis syndrome: Secondary | ICD-10-CM | POA: Diagnosis not present

## 2024-06-04 LAB — BASIC METABOLIC PANEL WITH GFR
Anion gap: 8 (ref 5–15)
BUN: <5 mg/dL — ABNORMAL LOW (ref 6–20)
CO2: 21 mmol/L — ABNORMAL LOW (ref 22–32)
Calcium: 8.4 mg/dL — ABNORMAL LOW (ref 8.9–10.3)
Chloride: 109 mmol/L (ref 98–111)
Creatinine, Ser: 0.73 mg/dL (ref 0.44–1.00)
GFR, Estimated: >60 mL/min (ref >60)
Glucose, Bld: 84 mg/dL (ref 70–99)
Potassium: 3.5 mmol/L (ref 3.5–5.1)
Sodium: 138 mmol/L (ref 135–145)

## 2024-06-04 LAB — PHOSPHORUS: Phosphorus: 1.8 mg/dL — ABNORMAL LOW (ref 2.5–4.6)

## 2024-06-04 LAB — CBC
HCT: 36.8 % (ref 36.0–46.0)
Hemoglobin: 12 g/dL (ref 12.0–15.0)
MCH: 32.2 pg (ref 26.0–34.0)
MCHC: 32.6 g/dL (ref 30.0–36.0)
MCV: 98.7 fL (ref 80.0–100.0)
Platelets: 280 K/uL (ref 150–400)
RBC: 3.73 MIL/uL — ABNORMAL LOW (ref 3.87–5.11)
RDW: 13.2 % (ref 11.5–15.5)
WBC: 8.5 K/uL (ref 4.0–10.5)
nRBC: 0 % (ref 0.0–0.2)

## 2024-06-04 LAB — MAGNESIUM: Magnesium: 2.2 mg/dL (ref 1.7–2.4)

## 2024-06-04 LAB — T3, FREE: T3, Free: 2.9 pg/mL (ref 2.0–4.4)

## 2024-06-04 MED ORDER — K PHOS MONO-SOD PHOS DI & MONO 155-852-130 MG PO TABS
500.0000 mg | ORAL_TABLET | Freq: Three times a day (TID) | ORAL | Status: DC
  Administered 2024-06-04 (×2): 500 mg via ORAL
  Filled 2024-06-04 (×2): qty 2

## 2024-06-04 MED ORDER — K PHOS MONO-SOD PHOS DI & MONO 155-852-130 MG PO TABS
500.0000 mg | ORAL_TABLET | Freq: Three times a day (TID) | ORAL | 0 refills | Status: AC
  Filled 2024-06-04: qty 30, 5d supply, fill #0

## 2024-06-04 MED ORDER — PROMETHAZINE HCL 25 MG PO TABS
25.0000 mg | ORAL_TABLET | Freq: Four times a day (QID) | ORAL | 0 refills | Status: AC | PRN
  Filled 2024-06-04 – 2024-07-18 (×3): qty 20, 5d supply, fill #0

## 2024-06-04 NOTE — Discharge Instructions (Signed)
 FOOD PANTRY [facility] [address] [phone]  [facility] [address] [place] [phone]  [facility] [employer] [address] [place] [phone]  [facility] [address] [phone]  [facility] [address] [place], [place] [address] [phone]

## 2024-06-04 NOTE — Discharge Summary (Signed)
 Physician Discharge Summary  DALANEY NEEDLE FMW:[id number] DOB: [birth year] DOA: 06/02/2024  PCP: Knute Thersia Bitters, FNP  Admit date: 06/02/2024 Discharge date: 06/04/2024  Admitted From: Home Disposition:  Home  Discharge Condition:Stable CODE STATUS:FULL Diet recommendation:  Regular  Brief/Interim Summary: Patient is a 38 year old female with history of cannabinoid hyperemesis syndrome, cyclical vomiting, Crohn's disease, drug-seeking behavior, chronic pain syndrome endometriosis status post partial hysterectomy, chronic headache who presented with nausea, vomiting, syncopal episode.  She was recently admitted for intractable nausea and vomiting thought to be secondary to marijuana use.  At that time, CT abdomen/pelvis did not show any acute findings.  On presentation, she was hemodynamically stable.  Lab work showed mild leukocytosis of 13.1.  Echo was done for syncopal workup which shows normal EF, no valvular abnormalities.  UA was not suspicious for UTI.  Diet advanced to soft and tolerated well.  She feels  much better today and wants to go home.  She has been counseled to quit marijuana.  Following problems were addressed during the hospitalization:  Intractable nausea/vomiting/abdominal pain: History of cannabinoid hyperemesis syndrome.  Has been in the emergency department numerous times for this.  Has chronic abdominal pain.  She says she uses cannabis for her abdominal pain.  She says her last cannabis use was about 3 weeks ago.  She was admitted for intractable nausea and vomiting and was discharged on 11/5.  At that time CT abdomen/pelvis did not show any acute findings. Started  gentle IV fluids, antiemetics Counseled to stop cannabis. Her nausea, vomiting, abdominal pain has resolved today.  She tolerated soft diet.   Syncope: Likely vasovagal in the setting of dehydration from nausea and vomiting versus orthostasis.  Echocardiogram showed EF of 55 to 60%, normal left  ventricular diastolic parameters, normal right ventricle systolic function.  Orthostatic vitals taken and was found to be  negative   Hypokalemia/hypophosphatemia: Continue supplementation on dc   Abnormal thyroid  test: TSH is low.Normal T3 and T4.  No indication to start on any medications.  Repeat TFT in 6 to 8 weeks    Discharge Diagnoses:  Active Problems:   Cannabis hyperemesis syndrome concurrent with and due to cannabis dependence (HCC)   Abdominal pain with vomiting   Hypokalemia   Intractable nausea and vomiting   Hypophosphatemia   Syncope, vasovagal    Discharge Instructions  Discharge Instructions     Diet general   Complete by: As directed    Discharge instructions   Complete by: As directed    1)Please take your medications as instructed 2)Follow up with your PCP in a week 3)Quit marijuana   Increase activity slowly   Complete by: As directed       Allergies as of 06/04/2024       Reactions   Zofran  [ondansetron  Hcl] Hives, Shortness Of Breath   Droperidol  Other (See Comments)   Tongue spasms, drooling, dystonia Tongue spasm starts to stick out her tongue and wave it side-to-side.  Maintains airway, oxygenation okay, no respiratory distress no other symptoms.  Consider dystonic reaction vs volitional reaction.   Ketorolac  Other (See Comments)   Tongue spasms, drooling, dystonia   Peanut-containing Drug Products Hives, Itching   Tomato Hives, Itching   Prochlorperazine  Other (See Comments)   Tongue spasms   Reglan  [metoclopramide ] Other (See Comments)   Tongue spasms   Zofran  Hives, Itching   Haloperidol  Rash   Other Itching   Fuzzy fruit        Medication List  STOP taking these medications    Thalitone  15 MG tablet Generic drug: chlorthalidone        TAKE these medications    acetaminophen  500 MG tablet Commonly known as: TYLENOL  Take 500 mg by mouth every 6 (six) hours as needed for moderate pain or mild pain.    CertaVite/Antioxidants Tabs Take 1 tablet by mouth daily.   clotrimazole -betamethasone  cream Commonly known as: LOTRISONE  Apply 1 Application topically daily.   dicyclomine  20 MG tablet Commonly known as: BENTYL  Take 1 tablet (20 mg total) by mouth 2 (two) times daily.   diphenhydrAMINE  25 MG tablet Commonly known as: BENADRYL  Take 25 mg by mouth every 6 (six) hours as needed for allergies.   phosphorus [id number] MG tablet Commonly known as: K PHOS  NEUTRAL Take 2 tablets (500 mg total) by mouth 3 (three) times daily for 5 days.   promethazine  25 MG suppository Commonly known as: PHENERGAN  Place 1 suppository (25 mg total) rectally every 6 (six) hours as needed for nausea or vomiting.   promethazine  25 MG tablet Commonly known as: PHENERGAN  Take 1 tablet (25 mg total) by mouth every 6 (six) hours as needed for nausea or vomiting.   topiramate  25 MG tablet Commonly known as: Topamax  Take 1 tablet (25 mg total) by mouth at bedtime for 7 days, THEN 2 tablets (50 mg total) daily. Start taking on: March 05, 2024        Follow-up Information     Caudle, Thersia Bitters, FNP. Schedule an appointment as soon as possible for a visit in 1 week(s).   Specialty: Family Medicine Contact information: [address] [place] [address] [phone]                Allergies  Allergen Reactions   Zofran  [Ondansetron  Hcl] Hives and Shortness Of Breath   Droperidol  Other (See Comments)    Tongue spasms, drooling, dystonia  Tongue spasm starts to stick out her tongue and wave it side-to-side.  Maintains airway, oxygenation okay, no respiratory distress no other symptoms.  Consider dystonic reaction vs volitional reaction.   Ketorolac  Other (See Comments)    Tongue spasms, drooling, dystonia   Peanut-Containing Drug Products Hives and Itching   Tomato Hives and Itching   Prochlorperazine  Other (See Comments)    Tongue spasms   Reglan   [Metoclopramide ] Other (See Comments)    Tongue spasms   Zofran  Hives and Itching   Haloperidol  Rash   Other Itching    Fuzzy fruit    Consultations: None   Procedures/Studies: ECHOCARDIOGRAM COMPLETE Result Date: 06/03/2024    ECHOCARDIOGRAM REPORT   Patient Name:   Sherry Key Date of Exam: 06/03/2024 Medical Rec #:  [id number]          Height:       63.0 in Accession #:    [id number]         Weight:       160.0 lb Date of Birth:  December 31, 1985           BSA:          1.759 m Patient Age:    38 years           BP:           135/97 mmHg Patient Gender: F                  HR:           63 bpm. Exam Location:  Inpatient Procedure: 2D  Echo, 3D Echo, Color Doppler, Cardiac Doppler and Strain Analysis            (Both Spectral and Color Flow Doppler were utilized during            procedure). Indications:    Syncope R55  History:        Patient has no prior history of Echocardiogram examinations.                 Signs/Symptoms:Syncope; Risk Factors:Current Smoker and                 Hypertension.  Sonographer:    Koleen Popper RDCS Referring Phys: VERGIE ALES DOUTOVA  Sonographer Comments: Global longitudinal strain was attempted. IMPRESSIONS  1. Left ventricular ejection fraction, by estimation, is 55 to 60%. The left ventricle has normal function. The left ventricle has no regional wall motion abnormalities. Left ventricular diastolic parameters were normal. The average left ventricular global longitudinal strain is -20.8 %. The global longitudinal strain is normal.  2. Right ventricular systolic function is normal. The right ventricular size is normal. There is normal pulmonary artery systolic pressure. The estimated right ventricular systolic pressure is 25.7 mmHg.  3. The mitral valve is grossly normal. Trivial mitral valve regurgitation. No evidence of mitral stenosis.  4. The aortic valve is tricuspid. Aortic valve regurgitation is not visualized. No aortic stenosis is present.  5. The  inferior vena cava is normal in size with greater than 50% respiratory variability, suggesting right atrial pressure of 3 mmHg. FINDINGS  Left Ventricle: Left ventricular ejection fraction, by estimation, is 55 to 60%. The left ventricle has normal function. The left ventricle has no regional wall motion abnormalities. The average left ventricular global longitudinal strain is -20.8 %. Strain was performed and the global longitudinal strain is normal. 3D ejection fraction reviewed and evaluated as part of the interpretation. Alternate measurement of EF is felt to be most reflective of LV function. The left ventricular internal cavity size was normal in size. There is no left ventricular hypertrophy. Left ventricular diastolic parameters were normal. Right Ventricle: The right ventricular size is normal. No increase in right ventricular wall thickness. Right ventricular systolic function is normal. There is normal pulmonary artery systolic pressure. The tricuspid regurgitant velocity is 2.38 m/s, and  with an assumed right atrial pressure of 3 mmHg, the estimated right ventricular systolic pressure is 25.7 mmHg. Left Atrium: Left atrial size was normal in size. Right Atrium: Right atrial size was normal in size. Pericardium: There is no evidence of pericardial effusion. Mitral Valve: The mitral valve is grossly normal. Trivial mitral valve regurgitation. No evidence of mitral valve stenosis. Tricuspid Valve: The tricuspid valve is grossly normal. Tricuspid valve regurgitation is mild . No evidence of tricuspid stenosis. Aortic Valve: The aortic valve is tricuspid. Aortic valve regurgitation is not visualized. No aortic stenosis is present. Pulmonic Valve: The pulmonic valve was grossly normal. Pulmonic valve regurgitation is mild. No evidence of pulmonic stenosis. Aorta: The aortic root and ascending aorta are structurally normal, with no evidence of dilitation. Venous: The right lower pulmonary vein is normal. The  inferior vena cava is normal in size with greater than 50% respiratory variability, suggesting right atrial pressure of 3 mmHg. IAS/Shunts: The atrial septum is grossly normal. Additional Comments: 3D was performed not requiring image post processing on an independent workstation and was indeterminate.  LEFT VENTRICLE PLAX 2D LVIDd:         4.00 cm  Diastology LVIDs:         2.70 cm      LV e' medial:    8.27 cm/s LV PW:         1.00 cm      LV E/e' medial:  8.4 LV IVS:        1.10 cm      LV e' lateral:   11.20 cm/s LVOT diam:     1.90 cm      LV E/e' lateral: 6.2 LV SV:         52 LV SV Index:   29           2D Longitudinal Strain LVOT Area:     2.84 cm     2D Strain GLS Avg:     -20.8 %  LV Volumes (MOD) LV vol d, MOD A4C: 128.0 ml 3D Volume EF: LV vol s, MOD A4C: 52.6 ml  3D EF:        63 % LV SV MOD A4C:     128.0 ml LV EDV:       145 ml                             LV ESV:       54 ml                             LV SV:        90 ml RIGHT VENTRICLE             IVC RV Basal diam:  3.60 cm     IVC diam: 1.60 cm RV S prime:     11.70 cm/s TAPSE (M-mode): 2.2 cm LEFT ATRIUM             Index        RIGHT ATRIUM          Index LA diam:        2.80 cm 1.59 cm/m   RA Area:     9.26 cm LA Vol (A2C):   27.2 ml 15.47 ml/m  RA Volume:   18.30 ml 10.41 ml/m LA Vol (A4C):   36.7 ml 20.87 ml/m LA Biplane Vol: 33.3 ml 18.93 ml/m  AORTIC VALVE LVOT Vmax:   88.70 cm/s LVOT Vmean:  56.100 cm/s LVOT VTI:    0.182 m  AORTA Ao Root diam: 3.40 cm Ao Asc diam:  2.90 cm MITRAL VALVE               TRICUSPID VALVE MV Area (PHT): 3.83 cm    TR Peak grad:   22.7 mmHg MV Decel Time: 198 msec    TR Mean grad:   15.0 mmHg MR Peak grad: 57.8 mmHg    TR Vmax:        238.00 cm/s MR Vmax:      380.00 cm/s  TR Vmean:       187.0 cm/s MV E velocity: 69.80 cm/s MV A velocity: 61.30 cm/s  SHUNTS MV E/A ratio:  1.14        Systemic VTI:  0.18 m                            Systemic Diam: 1.90 cm Darryle Decent MD Electronically signed by  Darryle Decent MD Signature Date/Time: 06/03/2024/10:26:06 AM  Final    DG Chest 2 View Result Date: 06/02/2024 CLINICAL DATA:  Fall.  Chest pain.  Vomiting. EXAM: CHEST - 2 VIEW COMPARISON:  None Available. FINDINGS: The heart size and mediastinal contours are within normal limits. Both lungs are clear. No evidence of pneumothorax or hemothorax. The visualized skeletal structures are unremarkable. IMPRESSION: Normal exam. Electronically Signed   By: Norleen DELENA Kil M.D.   On: 06/02/2024 12:01   CT Cervical Spine Wo Contrast Result Date: 06/02/2024 EXAM: CT CERVICAL SPINE WITHOUT CONTRAST 06/02/2024 11:22:00 AM TECHNIQUE: CT of the cervical spine was performed without the administration of intravenous contrast. Multiplanar reformatted images are provided for review. Automated exposure control, iterative reconstruction, and/or weight based adjustment of the mA/kV was utilized to reduce the radiation dose to as low as reasonably achievable. COMPARISON: 06/18/2023 CLINICAL HISTORY: Neck trauma, mechanically unstable spine (Age >= 16y) FINDINGS: CERVICAL SPINE: BONES AND ALIGNMENT: Straightening and slight reversal of the normal cervical lordosis. Trace retrolisthesis of C5 on C6. No evidence of traumatic malalignment. DEGENERATIVE CHANGES: Mild disc space narrowing and degenerative endplate osteophytes at the C5-C6 level. Disc osteophyte complexes at multiple levels. Asymmetric foraminal stenosis on the right at C5-C6. There is no high grade osseous spinal canal stenosis. SOFT TISSUES: No prevertebral soft tissue swelling. IMPRESSION: 1. No acute abnormality of the cervical spine. 2. Degenerative changes at C5-6. Electronically signed by: Donnice Mania MD 06/02/2024 11:53 AM EST RP Workstation: [id number]   CT Head Wo Contrast Result Date: 06/02/2024 EXAM: CT HEAD WITHOUT CONTRAST 06/02/2024 11:22:00 AM TECHNIQUE: CT of the head was performed without the administration of intravenous contrast. Automated  exposure control, iterative reconstruction, and/or weight based adjustment of the mA/kV was utilized to reduce the radiation dose to as low as reasonably achievable. COMPARISON: 02/01/2024 CLINICAL HISTORY: Head trauma, repeat vomiting (Age 71-64y) FINDINGS: BRAIN AND VENTRICLES: No acute hemorrhage. No evidence of acute infarct. No hydrocephalus. No extra-axial collection. No mass effect or midline shift. Partially empty sella. ORBITS: No acute abnormality. SINUSES: Mucosal thickening in bilateral maxillary sinuses. SOFT TISSUES AND SKULL: No acute soft tissue abnormality. No skull fracture. IMPRESSION: 1. No acute intracranial abnormality. Electronically signed by: Donnice Mania MD 06/02/2024 11:44 AM EST RP Workstation: [id number]   CT ABDOMEN PELVIS W CONTRAST Result Date: 05/13/2024 EXAM: CT ABDOMEN AND PELVIS WITH CONTRAST 05/13/2024 03:44:47 PM TECHNIQUE: CT of the abdomen and pelvis was performed with the administration of 100 mL of iohexol  (OMNIPAQUE ) 300 MG/ML solution. Multiplanar reformatted images are provided for review. Automated exposure control, iterative reconstruction, and/or weight-based adjustment of the mA/kV was utilized to reduce the radiation dose to as low as reasonably achievable. COMPARISON: CT abdomen and pelvis 01/31/2002. CLINICAL HISTORY: Abdominal pain, acute, nonlocalized. FINDINGS: LOWER CHEST: No acute abnormality. LIVER: The liver is unremarkable. GALLBLADDER AND BILE DUCTS: The gallbladder is surgically absent. No biliary ductal dilatation. SPLEEN: No acute abnormality. PANCREAS: No acute abnormality. ADRENAL GLANDS: No acute abnormality. KIDNEYS, URETERS AND BLADDER: No stones in the kidneys or ureters. No hydronephrosis. No perinephric or periureteral stranding. Urinary bladder is unremarkable. GI AND BOWEL: Stomach demonstrates no acute abnormality. Appendix is not visualized. There is no bowel obstruction. PERITONEUM AND RETROPERITONEUM: No ascites. No free air.  VASCULATURE: Aorta is normal in caliber. LYMPH NODES: No lymphadenopathy. REPRODUCTIVE ORGANS: Uterus is not visualized. BONES AND SOFT TISSUES: There is mild chronic compression deformities of L1 which is unchanged. No focal soft tissue abnormality. IMPRESSION: 1. No acute findings in the abdomen or pelvis. 2. Status post cholecystectomy.  Electronically signed by: Greig Pique MD 05/13/2024 04:23 PM EST RP Workstation: [id number]      Subjective: Patient seen and examined at bedside today.  Hemodynamically stable.  Feels much better today.  No abdominal pain, nausea or vomiting.  Tolerating soft diet.  Feels ready go home.  Counseled to quit marijuana permanently  Discharge Exam: Vitals:   06/04/24 0527 06/04/24 1158  BP: 90/61 (!) 127/91  Pulse: 69 73  Resp: 17   Temp: 98.1 F (36.7 C) 97.9 F (36.6 C)  SpO2: 100% 100%   Vitals:   06/03/24 1211 06/03/24 2004 06/04/24 0527 06/04/24 1158  BP: 118/79 (!) 129/91 90/61 (!) 127/91  Pulse: 74 80 69 73  Resp: 16 18 17    Temp: 98.3 F (36.8 C) 97.8 F (36.6 C) 98.1 F (36.7 C) 97.9 F (36.6 C)  TempSrc:    Oral  SpO2: 100% 100% 100% 100%    General: Pt is alert, awake, not in acute distress Cardiovascular: RRR, S1/S2 +, no rubs, no gallops Respiratory: CTA bilaterally, no wheezing, no rhonchi Abdominal: Soft, NT, ND, bowel sounds + Extremities: no edema, no cyanosis    The results of significant diagnostics from this hospitalization (including imaging, microbiology, ancillary and laboratory) are listed below for reference.     Microbiology: No results found for this or any previous visit (from the past 240 hours).   Labs: BNP (last 3 results) No results for input(s): BNP in the last 8760 hours. Basic Metabolic Panel: Recent Labs  Lab 06/02/24 1030 06/02/24 2103 06/03/24 0528 06/04/24 0612  NA 138 136 137 138  K 3.7 3.3* 3.4* 3.5  CL 102 104 105 109  CO2 18* 18* 19* 21*  GLUCOSE 107* 103* 78 84  BUN 16 9 8  <5*   CREATININE 1.02* 0.77 0.75 0.73  CALCIUM 10.3 9.4 8.9 8.4*  MG  --  2.0 2.0 2.2  PHOS  --  1.5* 2.1* 1.8*   Liver Function Tests: Recent Labs  Lab 06/02/24 1030 06/02/24 2103 06/03/24 0528  AST 25 21 17   ALT 12 11 8   ALKPHOS 89 85 75  BILITOT 0.6 0.6 0.6  PROT 8.6* 8.1 7.3  ALBUMIN 5.1* 4.5 4.0   Recent Labs  Lab 06/02/24 1030  LIPASE 22   No results for input(s): AMMONIA in the last 168 hours. CBC: Recent Labs  Lab 06/02/24 1030 06/02/24 2103 06/03/24 0528 06/04/24 0612  WBC 13.1* 13.6* 12.9* 8.5  NEUTROABS  --  12.6*  --   --   HGB 14.6 14.0 12.5 12.0  HCT 43.7 41.9 38.3 36.8  MCV 94.4 96.1 96.7 98.7  PLT 338 324 305 280   Cardiac Enzymes: Recent Labs  Lab 06/02/24 2103  CKTOTAL 75   BNP: Invalid input(s): POCBNP CBG: Recent Labs  Lab 06/02/24 1002 06/02/24 1652  GLUCAP 116* 99   D-Dimer Recent Labs    06/03/24 0528  DDIMER 0.27   Hgb A1c No results for input(s): HGBA1C in the last 72 hours. Lipid Profile No results for input(s): CHOL, HDL, LDLCALC, TRIG, CHOLHDL, LDLDIRECT in the last 72 hours. Thyroid  function studies Recent Labs    06/02/24 2328 06/03/24 1153  TSH 0.160*  --   T3FREE  --  2.9   Anemia work up No results for input(s): VITAMINB12, FOLATE, FERRITIN, TIBC, IRON, RETICCTPCT in the last 72 hours. Urinalysis    Component Value Date/Time   COLORURINE YELLOW 06/02/2024 0957   APPEARANCEUR CLEAR 06/02/2024 0957   LABSPEC >=1.030 06/02/2024  0957   PHURINE 7.0 06/02/2024 0957   GLUCOSEU NEGATIVE 06/02/2024 0957   HGBUR TRACE (A) 06/02/2024 0957   BILIRUBINUR NEGATIVE 06/02/2024 0957   KETONESUR 40 (A) 06/02/2024 0957   PROTEINUR NEGATIVE 06/02/2024 0957   UROBILINOGEN 0.2 01/18/2015 1324   NITRITE NEGATIVE 06/02/2024 0957   LEUKOCYTESUR NEGATIVE 06/02/2024 0957   Sepsis Labs Recent Labs  Lab 06/02/24 1030 06/02/24 2103 06/03/24 0528 06/04/24 0612  WBC 13.1* 13.6* 12.9* 8.5    Microbiology No results found for this or any previous visit (from the past 240 hours).  Please note: You were cared for by a hospitalist during your hospital stay. Once you are discharged, your primary care physician will handle any further medical issues. Please note that NO REFILLS for any discharge medications will be authorized once you are discharged, as it is imperative that you return to your primary care physician (or establish a relationship with a primary care physician if you do not have one) for your post hospital discharge needs so that they can reassess your need for medications and monitor your lab values.    Time coordinating discharge: 40 minutes  SIGNED:   Ivonne Mustache, MD  [facility] 06/04/2024, 1:17 PM Pager [phone]  If 7PM-7AM, please contact night-coverage [email or web address] Password [email or web address]

## 2024-06-04 NOTE — Plan of Care (Signed)

## 2024-06-10 ENCOUNTER — Encounter (HOSPITAL_BASED_OUTPATIENT_CLINIC_OR_DEPARTMENT_OTHER): Payer: Self-pay

## 2024-06-12 ENCOUNTER — Inpatient Hospital Stay (HOSPITAL_BASED_OUTPATIENT_CLINIC_OR_DEPARTMENT_OTHER): Payer: MEDICAID

## 2024-06-12 ENCOUNTER — Encounter (HOSPITAL_BASED_OUTPATIENT_CLINIC_OR_DEPARTMENT_OTHER): Payer: Self-pay | Admitting: Family Medicine

## 2024-06-13 ENCOUNTER — Other Ambulatory Visit (HOSPITAL_COMMUNITY): Payer: Self-pay

## 2024-06-23 ENCOUNTER — Inpatient Hospital Stay (HOSPITAL_BASED_OUTPATIENT_CLINIC_OR_DEPARTMENT_OTHER): Payer: MEDICAID

## 2024-07-18 ENCOUNTER — Other Ambulatory Visit (HOSPITAL_BASED_OUTPATIENT_CLINIC_OR_DEPARTMENT_OTHER): Payer: Self-pay

## 2024-07-18 ENCOUNTER — Other Ambulatory Visit (HOSPITAL_COMMUNITY): Payer: Self-pay

## 2024-07-21 ENCOUNTER — Other Ambulatory Visit (HOSPITAL_COMMUNITY): Payer: Self-pay
